# Patient Record
Sex: Female | Born: 1950 | Race: Black or African American | Hispanic: No | Marital: Single | Smoking: Current every day smoker
Health system: Southern US, Community
[De-identification: ages and names within clinical notes are randomized; demographics above are authoritative.]

## PROBLEM LIST (undated history)

## (undated) DIAGNOSIS — K219 Gastro-esophageal reflux disease without esophagitis: Secondary | ICD-10-CM

## (undated) DIAGNOSIS — M1990 Unspecified osteoarthritis, unspecified site: Secondary | ICD-10-CM

## (undated) DIAGNOSIS — I10 Essential (primary) hypertension: Secondary | ICD-10-CM

## (undated) DIAGNOSIS — Z8709 Personal history of other diseases of the respiratory system: Secondary | ICD-10-CM

## (undated) HISTORY — PX: OTHER SURGICAL HISTORY: SHX169

## (undated) HISTORY — PX: KNEE SURGERY: SHX244

## (undated) HISTORY — DX: Gastro-esophageal reflux disease without esophagitis: K21.9

---

## 1999-11-19 ENCOUNTER — Other Ambulatory Visit: Admission: RE | Admit: 1999-11-19 | Discharge: 1999-11-19 | Payer: Self-pay | Admitting: Internal Medicine

## 2000-09-14 ENCOUNTER — Encounter: Payer: Self-pay | Admitting: Emergency Medicine

## 2000-09-14 ENCOUNTER — Emergency Department (HOSPITAL_COMMUNITY): Admission: EM | Admit: 2000-09-14 | Discharge: 2000-09-14 | Payer: Self-pay | Admitting: Emergency Medicine

## 2001-05-11 ENCOUNTER — Other Ambulatory Visit: Admission: RE | Admit: 2001-05-11 | Discharge: 2001-05-11 | Payer: Self-pay | Admitting: Internal Medicine

## 2001-05-21 ENCOUNTER — Encounter: Admission: RE | Admit: 2001-05-21 | Discharge: 2001-05-21 | Payer: Self-pay | Admitting: Internal Medicine

## 2001-05-21 ENCOUNTER — Encounter: Payer: Self-pay | Admitting: Internal Medicine

## 2001-07-14 ENCOUNTER — Encounter: Admission: RE | Admit: 2001-07-14 | Discharge: 2001-07-14 | Payer: Self-pay | Admitting: Internal Medicine

## 2001-07-14 ENCOUNTER — Encounter: Payer: Self-pay | Admitting: Internal Medicine

## 2001-12-31 ENCOUNTER — Emergency Department (HOSPITAL_COMMUNITY): Admission: EM | Admit: 2001-12-31 | Discharge: 2001-12-31 | Payer: Self-pay | Admitting: Emergency Medicine

## 2001-12-31 ENCOUNTER — Encounter: Payer: Self-pay | Admitting: Emergency Medicine

## 2002-09-13 ENCOUNTER — Emergency Department (HOSPITAL_COMMUNITY): Admission: EM | Admit: 2002-09-13 | Discharge: 2002-09-13 | Payer: Self-pay | Admitting: Emergency Medicine

## 2004-11-06 ENCOUNTER — Emergency Department (HOSPITAL_COMMUNITY): Admission: EM | Admit: 2004-11-06 | Discharge: 2004-11-06 | Payer: Self-pay | Admitting: Emergency Medicine

## 2004-11-26 ENCOUNTER — Emergency Department (HOSPITAL_COMMUNITY): Admission: EM | Admit: 2004-11-26 | Discharge: 2004-11-26 | Payer: Self-pay | Admitting: Emergency Medicine

## 2005-04-13 ENCOUNTER — Emergency Department (HOSPITAL_COMMUNITY): Admission: EM | Admit: 2005-04-13 | Discharge: 2005-04-13 | Payer: Self-pay | Admitting: Emergency Medicine

## 2005-08-08 ENCOUNTER — Emergency Department (HOSPITAL_COMMUNITY): Admission: EM | Admit: 2005-08-08 | Discharge: 2005-08-08 | Payer: Self-pay | Admitting: Emergency Medicine

## 2005-08-12 ENCOUNTER — Ambulatory Visit (HOSPITAL_COMMUNITY): Admission: RE | Admit: 2005-08-12 | Discharge: 2005-08-12 | Payer: Self-pay | Admitting: Orthopedic Surgery

## 2005-10-29 ENCOUNTER — Emergency Department (HOSPITAL_COMMUNITY): Admission: EM | Admit: 2005-10-29 | Discharge: 2005-10-29 | Payer: Self-pay | Admitting: Emergency Medicine

## 2006-09-05 ENCOUNTER — Emergency Department (HOSPITAL_COMMUNITY): Admission: EM | Admit: 2006-09-05 | Discharge: 2006-09-06 | Payer: Self-pay | Admitting: Emergency Medicine

## 2007-12-14 ENCOUNTER — Emergency Department (HOSPITAL_COMMUNITY): Admission: EM | Admit: 2007-12-14 | Discharge: 2007-12-14 | Payer: Self-pay | Admitting: Emergency Medicine

## 2008-03-09 ENCOUNTER — Emergency Department (HOSPITAL_COMMUNITY): Admission: EM | Admit: 2008-03-09 | Discharge: 2008-03-09 | Payer: Self-pay | Admitting: Emergency Medicine

## 2008-04-18 ENCOUNTER — Emergency Department (HOSPITAL_COMMUNITY): Admission: EM | Admit: 2008-04-18 | Discharge: 2008-04-18 | Payer: Self-pay | Admitting: Emergency Medicine

## 2008-10-30 ENCOUNTER — Emergency Department (HOSPITAL_COMMUNITY): Admission: EM | Admit: 2008-10-30 | Discharge: 2008-10-30 | Payer: Self-pay | Admitting: Emergency Medicine

## 2009-02-26 ENCOUNTER — Emergency Department (HOSPITAL_COMMUNITY): Admission: EM | Admit: 2009-02-26 | Discharge: 2009-02-27 | Payer: Self-pay | Admitting: Emergency Medicine

## 2010-04-10 ENCOUNTER — Emergency Department (HOSPITAL_COMMUNITY): Admission: EM | Admit: 2010-04-10 | Discharge: 2010-04-10 | Payer: Self-pay | Admitting: Emergency Medicine

## 2010-04-22 ENCOUNTER — Emergency Department (HOSPITAL_COMMUNITY): Admission: EM | Admit: 2010-04-22 | Discharge: 2010-04-23 | Payer: Self-pay | Admitting: Emergency Medicine

## 2010-10-16 ENCOUNTER — Emergency Department (HOSPITAL_COMMUNITY): Payer: Self-pay

## 2010-10-16 ENCOUNTER — Emergency Department (HOSPITAL_COMMUNITY)
Admission: EM | Admit: 2010-10-16 | Discharge: 2010-10-16 | Disposition: A | Discharge status: Home / Self Care | Payer: Self-pay | Attending: Emergency Medicine | Admitting: Emergency Medicine

## 2010-10-16 DIAGNOSIS — I10 Essential (primary) hypertension: Secondary | ICD-10-CM | POA: Insufficient documentation

## 2010-10-16 DIAGNOSIS — M79609 Pain in unspecified limb: Secondary | ICD-10-CM | POA: Insufficient documentation

## 2010-10-16 LAB — POCT CARDIAC MARKERS
CKMB, poc: <1 ng/mL — ABNORMAL LOW (ref 1.0–8.0)
Troponin i, poc: <0.05 ng/mL (ref 0.00–0.09)

## 2010-10-16 LAB — POCT I-STAT, CHEM 8
Chloride: 108 mEq/L (ref 96–112)
Creatinine, Ser: 0.9 mg/dL (ref 0.4–1.2)
Glucose, Bld: 105 mg/dL — ABNORMAL HIGH (ref 70–99)
HCT: 38 % (ref 36.0–46.0)
Sodium: 142 mEq/L (ref 135–145)
TCO2: 24 mmol/L (ref 0–100)

## 2010-11-08 LAB — DIFFERENTIAL
Basophils Relative: 0 % (ref 0–1)
Eosinophils Absolute: 0.1 10*3/uL (ref 0.0–0.7)
Eosinophils Relative: 2 % (ref 0–5)
Lymphocytes Relative: 15 % (ref 12–46)
Monocytes Relative: 4 % (ref 3–12)
Neutro Abs: 5.7 10*3/uL (ref 1.7–7.7)
Neutrophils Relative %: 80 % — ABNORMAL HIGH (ref 43–77)

## 2010-11-08 LAB — POCT I-STAT, CHEM 8
Calcium, Ion: 1.04 mmol/L — ABNORMAL LOW (ref 1.12–1.32)
Chloride: 107 mEq/L (ref 96–112)
Creatinine, Ser: 1.1 mg/dL (ref 0.4–1.2)
Glucose, Bld: 117 mg/dL — ABNORMAL HIGH (ref 70–99)
Potassium: 4 mEq/L (ref 3.5–5.1)
Sodium: 140 mEq/L (ref 135–145)
TCO2: 25 mmol/L (ref 0–100)

## 2010-11-08 LAB — CBC
Hemoglobin: 12.2 g/dL (ref 12.0–15.0)
MCH: 27 pg (ref 26.0–34.0)
MCV: 81.4 fL (ref 78.0–100.0)
RDW: 15.4 % (ref 11.5–15.5)

## 2010-11-08 LAB — POCT CARDIAC MARKERS: Myoglobin, poc: 70.3 ng/mL (ref 12–200)

## 2011-01-14 ENCOUNTER — Emergency Department (HOSPITAL_COMMUNITY)
Admission: EM | Admit: 2011-01-14 | Discharge: 2011-01-14 | Disposition: A | Discharge status: Home / Self Care | Payer: Self-pay | Attending: Emergency Medicine | Admitting: Emergency Medicine

## 2011-01-14 ENCOUNTER — Emergency Department (HOSPITAL_COMMUNITY): Payer: Self-pay

## 2011-01-14 DIAGNOSIS — I10 Essential (primary) hypertension: Secondary | ICD-10-CM | POA: Insufficient documentation

## 2011-01-14 DIAGNOSIS — M79609 Pain in unspecified limb: Secondary | ICD-10-CM | POA: Insufficient documentation

## 2011-01-14 DIAGNOSIS — M109 Gout, unspecified: Secondary | ICD-10-CM | POA: Insufficient documentation

## 2011-01-14 LAB — COMPREHENSIVE METABOLIC PANEL
Albumin: 3.5 g/dL (ref 3.5–5.2)
Alkaline Phosphatase: 129 U/L — ABNORMAL HIGH (ref 39–117)
CO2: 26 mEq/L (ref 19–32)
Creatinine, Ser: 0.75 mg/dL (ref 0.4–1.2)
GFR calc Af Amer: >60 mL/min (ref >60)
Glucose, Bld: 126 mg/dL — ABNORMAL HIGH (ref 70–99)
Total Protein: 7.6 g/dL (ref 6.0–8.3)

## 2011-05-20 LAB — DIFFERENTIAL
Basophils Relative: 0
Eosinophils Absolute: 0.1
Lymphs Abs: 1.4
Monocytes Relative: 6
Neutro Abs: 4.6

## 2011-05-20 LAB — CBC
HCT: 39.1
Hemoglobin: 12.7
MCHC: 32.6
MCV: 83.4
Platelets: 208
RBC: 4.68
RDW: 14.7
WBC: 6.5

## 2011-05-20 LAB — BASIC METABOLIC PANEL
BUN: 11
Calcium: 9.1
Creatinine, Ser: 0.85
GFR calc non Af Amer: >60
Glucose, Bld: 92
Sodium: 140

## 2011-05-20 LAB — POCT CARDIAC MARKERS
Operator id: 1415
Troponin i, poc: <0.05

## 2011-05-20 LAB — B-NATRIURETIC PEPTIDE (CONVERTED LAB): Pro B Natriuretic peptide (BNP): <30

## 2011-05-23 LAB — DIFFERENTIAL
Basophils Absolute: 0
Basophils Relative: 0
Eosinophils Absolute: 0.1
Eosinophils Relative: 1
Lymphocytes Relative: 10 — ABNORMAL LOW
Lymphs Abs: 1.1
Neutro Abs: 9 — ABNORMAL HIGH
Neutrophils Relative %: 87 — ABNORMAL HIGH

## 2011-05-23 LAB — POCT I-STAT, CHEM 8
BUN: 14
Creatinine, Ser: 1
Glucose, Bld: 112 — ABNORMAL HIGH
Hemoglobin: 12.6
TCO2: 27

## 2011-05-23 LAB — POCT CARDIAC MARKERS: Troponin i, poc: <0.05

## 2011-05-23 LAB — CBC
HCT: 35 — ABNORMAL LOW
Hemoglobin: 11.5 — ABNORMAL LOW
MCHC: 32.8
Platelets: 243
RBC: 4.22
RDW: 14.1

## 2011-12-10 ENCOUNTER — Encounter (HOSPITAL_COMMUNITY): Payer: Self-pay | Admitting: *Deleted

## 2011-12-10 ENCOUNTER — Emergency Department (HOSPITAL_COMMUNITY)
Admission: EM | Admit: 2011-12-10 | Discharge: 2011-12-10 | Disposition: A | Discharge status: Home / Self Care | Payer: Self-pay | Attending: Emergency Medicine | Admitting: Emergency Medicine

## 2011-12-10 DIAGNOSIS — Z79899 Other long term (current) drug therapy: Secondary | ICD-10-CM | POA: Insufficient documentation

## 2011-12-10 DIAGNOSIS — M19019 Primary osteoarthritis, unspecified shoulder: Secondary | ICD-10-CM | POA: Insufficient documentation

## 2011-12-10 DIAGNOSIS — M1710 Unilateral primary osteoarthritis, unspecified knee: Secondary | ICD-10-CM | POA: Insufficient documentation

## 2011-12-10 DIAGNOSIS — I10 Essential (primary) hypertension: Secondary | ICD-10-CM | POA: Insufficient documentation

## 2011-12-10 DIAGNOSIS — M25561 Pain in right knee: Secondary | ICD-10-CM

## 2011-12-10 DIAGNOSIS — M25519 Pain in unspecified shoulder: Secondary | ICD-10-CM

## 2011-12-10 DIAGNOSIS — F17200 Nicotine dependence, unspecified, uncomplicated: Secondary | ICD-10-CM | POA: Insufficient documentation

## 2011-12-10 HISTORY — DX: Unspecified osteoarthritis, unspecified site: M19.90

## 2011-12-10 HISTORY — DX: Essential (primary) hypertension: I10

## 2011-12-10 MED ORDER — KETOROLAC TROMETHAMINE 60 MG/2ML IM SOLN
60.0000 mg | Freq: Once | INTRAMUSCULAR | Status: AC
  Administered 2011-12-10: 60 mg via INTRAMUSCULAR
  Filled 2011-12-10: qty 2

## 2011-12-10 MED ORDER — BACLOFEN 10 MG PO TABS: 10.0000 mg | ORAL_TABLET | Freq: Three times a day (TID) | ORAL | Status: AC

## 2011-12-10 MED ORDER — OXYCODONE-ACETAMINOPHEN 5-325 MG PO TABS
1.0000 | ORAL_TABLET | Freq: Once | ORAL | Status: DC
  Filled 2011-12-10: qty 1

## 2011-12-10 NOTE — Discharge Instructions (Signed)
Acromioclavicular Injuries  The AC (acromioclavicular) joint is the joint in the shoulder where the collarbone (clavicle) meets the shoulder blade (scapula). The part of the shoulder blade connected to the collarbone is called the acromion. Common problems with and treatments for the AC joint are detailed below.  ARTHRITIS  Arthritis occurs when the joint has been injured and the smooth padding between the joints (cartilage) is lost. This is the wear and tear seen in most joints of the body if they have been overused. This causes the joint to produce pain and swelling which is worse with activity.   AC JOINT SEPARATION  AC joint separation means that the ligaments connecting the acromion of the shoulder blade and collarbone have been damaged, and the two bones no longer line up. AC separations can be anywhere from mild to severe, and are "graded" depending upon which ligaments are torn and how badly they are torn.   Grade I Injury: the least damage is done, and the AC joint still lines up.   Grade II Injury: damage to the ligaments which reinforce the AC joint. In a Grade II injury, these ligaments are stretched but not entirely torn. When stressed, the AC joint becomes painful and unstable.   Grade III Injury: AC and secondary ligaments are completely torn, and the collarbone is no longer attached to the shoulder blade. This results in deformity; a prominence of the end of the clavicle.  AC JOINT FRACTURE  AC joint fracture means that there has been a break in the bones of the AC joint, usually the end of the clavicle.  TREATMENT  TREATMENT OF AC ARTHRITIS   There is currently no way to replace the cartilage damaged by arthritis. The best way to improve the condition is to decrease the activities which aggravate the problem. Application of ice to the joint helps decrease pain and soreness (inflammation). The use of non-steroidal anti-inflammatory medication is helpful.   If less conservative measures do not  work, then cortisone shots (injections) may be used. These are anti-inflammatories; they decrease the soreness in the joint and swelling.   If non-surgical measures fail, surgery may be recommended. The procedure is generally removal of a portion of the end of the clavicle. This is the part of the collarbone closest to your acromion which is stabilized with ligaments to the acromion of the shoulder blade. This surgery may be performed using a tube-like instrument with a light (arthroscope) for looking into a joint. It may also be performed as an open surgery through a small incision by the surgeon. Most patients will have good range of motion within 6 weeks and may return to all activity including sports by 8-12 weeks, barring complications.  TREATMENT OF AN AC SEPARATION   The initial treatment is to decrease pain. This is best accomplished by immobilizing the arm in a sling and placing an ice pack to the shoulder for 20 to 30 minutes every 2 hours as needed. As the pain starts to subside, it is important to begin moving the fingers, wrist, elbow and eventually the shoulder in order to prevent a stiff or "frozen" shoulder. Instruction on when and how much to move the shoulder will be provided by your caregiver. The length of time needed to regain full motion and function depends on the amount or grade of the injury. Recovery from a Grade I AC separation usually takes 10 to 14 days, whereas a Grade III may take 6 to 8 weeks.   Grade   III injuries usually allow return to full activity with few restrictions. Treatment is also based on the activity demands of the injured shoulder. For example, a high level quarterback with an injured throwing arm will receive more aggressive treatment than someone with a desk job who rarely uses his/her arm for strenuous activities. In some cases, a painful lump may persist which could require a later surgery.  Surgery can be very successful, but the benefits must be weighed against the potential risks.  TREATMENT OF AN AC JOINT FRACTURE Fracture treatment depends on the type of fracture. Sometimes a splint or sling may be all that is required. Other times surgery may be required for repair. This is more frequently the case when the ligaments supporting the clavicle are completely torn. Your caregiver will help you with these decisions and together you can decide what will be the best treatment. HOME CARE INSTRUCTIONS   Apply ice to the injury for 15 to 20 minutes each hour while awake for 2 days. Put the ice in a plastic bag and place a towel between the bag of ice and skin.   If a sling has been applied, wear it constantly for as long as directed by your caregiver, even at night. The sling or splint can be removed for bathing or showering or as directed. Be sure to keep the shoulder in the same place as when the sling is on. Do not lift the arm.   If a figure-of-eight splint has been applied it should be tightened gently by another person every day. Tighten it enough to keep the shoulders held back. Allow enough room to place the index finger between the body and strap. Loosen the splint immediately if there is numbness or tingling in the hands.   Take over-the-counter or prescription medicines for pain, discomfort or fever as directed by your caregiver.   If you or your child has received a follow up appointment, it is very important to keep that appointment in order to avoid long term complications, chronic pain or disability.  SEEK MEDICAL CARE IF:   The pain is not relieved with medications.   There is increased swelling or discoloration that continues to get worse rather than better.   You or your child has been unable to follow up as instructed.   There is progressive numbness and tingling in the arm, forearm or hand.  SEEK IMMEDIATE MEDICAL CARE IF:   The arm is numb, cold or pale.    There is increasing pain in the hand, forearm or fingers.  MAKE SURE YOU:   Understand these instructions.   Will watch your condition.   Will get help right away if you are not doing well or get worse.  Document Released: 05/21/2005 Document Revised: 07/31/2011 Document Reviewed: 11/13/2008 [facility] Patient Information 2012 [place], [place].Arthralgia Your caregiver has diagnosed you as suffering from an arthralgia. Arthralgia means there is pain in a joint. This can come from many reasons including:  Bruising the joint which causes soreness (inflammation) in the joint.   Wear and tear on the joints which occur as we grow older (osteoarthritis).   Overusing the joint.   Various forms of arthritis.   Infections of the joint.  Regardless of the cause of pain in your joint, most of these different pains respond to anti-inflammatory drugs and rest. The exception to this is when a joint is infected, and these cases are treated with antibiotics, if it is a bacterial infection. HOME CARE INSTRUCTIONS  Rest the injured area for as long as directed by your caregiver. Then slowly start using the joint as directed by your caregiver and as the pain allows. Crutches as directed may be useful if the ankles, knees or hips are involved. If the knee was splinted or casted, continue use and care as directed. If an stretchy or elastic wrapping bandage has been applied today, it should be removed and re-applied every 3 to 4 hours. It should not be applied tightly, but firmly enough to keep swelling down. Watch toes and feet for swelling, bluish discoloration, coldness, numbness or excessive pain. If any of these problems (symptoms) occur, remove the ace bandage and re-apply more loosely. If these symptoms persist, contact your caregiver or return to this location.   For the first 24 hours, keep the injured extremity elevated on pillows while lying down.   Apply ice for 15 to 20 minutes to the sore joint  every couple hours while awake for the first half day. Then 3 to 4 times per day for the first 48 hours. Put the ice in a plastic bag and place a towel between the bag of ice and your skin.   Wear any splinting, casting, elastic bandage applications, or slings as instructed.   Only take over-the-counter or prescription medicines for pain, discomfort, or fever as directed by your caregiver. Do not use aspirin immediately after the injury unless instructed by your physician. Aspirin can cause increased bleeding and bruising of the tissues.   If you were given crutches, continue to use them as instructed and do not resume weight bearing on the sore joint until instructed.  Persistent pain and inability to use the sore joint as directed for more than 2 to 3 days are warning signs indicating that you should see a caregiver for a follow-up visit as soon as possible. Initially, a hairline fracture (break in bone) may not be evident on X-rays. Persistent pain and swelling indicate that further evaluation, non-weight bearing or use of the joint (use of crutches or slings as instructed), or further X-rays are indicated. X-rays may sometimes not show a small fracture until a week or 10 days later. Make a follow-up appointment with your own caregiver or one to whom we have referred you. A radiologist (specialist in reading X-rays) may read your X-rays. Make sure you know how you are to obtain your X-ray results. Do not assume everything is normal if you do not hear from [place]. SEEK MEDICAL CARE IF: Bruising, swelling, or pain increases. SEEK IMMEDIATE MEDICAL CARE IF:   Your fingers or toes are numb or blue.   The pain is not responding to medications and continues to stay the same or get worse.   The pain in your joint becomes severe.   You develop a fever over 102 F (38.9 C).   It becomes impossible to move or use the joint.  MAKE SURE YOU:   Understand these instructions.   Will watch your condition.     Will get help right away if you are not doing well or get worse.  Document Released: 08/11/2005 Document Revised: 07/31/2011 Document Reviewed: 03/29/2008 [facility] Patient Information 2012 [place], [place].Knee Pain The knee is the complex joint between your thigh and your lower leg. It is made up of bones, tendons, ligaments, and cartilage. The bones that make up the knee are:  The femur in the thigh.   The tibia and fibula in the lower leg.   The patella or kneecap  riding in the groove on the lower femur.  CAUSES  Knee pain is a common complaint with many causes. A few of these causes are:  Injury, such as:   A ruptured ligament or tendon injury.   Torn cartilage.   Medical conditions, such as:   Gout   Arthritis   Infections   Overuse, over training or overdoing a physical activity.  Knee pain can be minor or severe. Knee pain can accompany debilitating injury. Minor knee problems often respond well to self-care measures or get well on their own. More serious injuries may need medical intervention or even surgery. SYMPTOMS The knee is complex. Symptoms of knee problems can vary widely. Some of the problems are:  Pain with movement and weight bearing.   Swelling and tenderness.   Buckling of the knee.   Inability to straighten or extend your knee.   Your knee locks and you cannot straighten it.   Warmth and redness with pain and fever.   Deformity or dislocation of the kneecap.  DIAGNOSIS  Determining what is wrong may be very straight forward such as when there is an injury. It can also be challenging because of the complexity of the knee. Tests to make a diagnosis may include:  Your caregiver taking a history and doing a physical exam.   Routine X-rays can be used to rule out other problems. X-rays will not reveal a cartilage tear. Some injuries of the knee can be diagnosed by:   Arthroscopy a surgical technique by which a small video camera is inserted  through tiny incisions on the sides of the knee. This procedure is used to examine and repair internal knee joint problems. Tiny instruments can be used during arthroscopy to repair the torn knee cartilage (meniscus).   Arthrography is a radiology technique. A contrast liquid is directly injected into the knee joint. Internal structures of the knee joint then become visible on X-ray film.   An MRI scan is a non x-ray radiology procedure in which magnetic fields and a computer produce two- or three-dimensional images of the inside of the knee. Cartilage tears are often visible using an MRI scanner. MRI scans have largely replaced arthrography in diagnosing cartilage tears of the knee.   Blood work.   Examination of the fluid that helps to lubricate the knee joint (synovial fluid). This is done by taking a sample out using a needle and a syringe.  TREATMENT The treatment of knee problems depends on the cause. Some of these treatments are:  Depending on the injury, proper casting, splinting, surgery or physical therapy care will be needed.   Give yourself adequate recovery time. Do not overuse your joints. If you begin to get sore during workout routines, back off. Slow down or do fewer repetitions.   For repetitive activities such as cycling or running, maintain your strength and nutrition.   Alternate muscle groups. For example if you are a weight lifter, work the upper body on one day and the lower body the next.   Either tight or weak muscles do not give the proper support for your knee. Tight or weak muscles do not absorb the stress placed on the knee joint. Keep the muscles surrounding the knee strong.   Take care of mechanical problems.   If you have flat feet, orthotics or special shoes may help. See your caregiver if you need help.   Arch supports, sometimes with wedges on the inner or outer aspect of the heel, can  help. These can shift pressure away from the side of the knee most  bothered by osteoarthritis.   A brace called an "unloader" brace also may be used to help ease the pressure on the most arthritic side of the knee.   If your caregiver has prescribed crutches, braces, wraps or ice, use as directed. The acronym for this is PRICE. This means protection, rest, ice, compression and elevation.   Nonsteroidal anti-inflammatory drugs (NSAID's), can help relieve pain. But if taken immediately after an injury, they may actually increase swelling. Take NSAID's with food in your stomach. Stop them if you develop stomach problems. Do not take these if you have a history of ulcers, stomach pain or bleeding from the bowel. Do not take without your caregiver's approval if you have problems with fluid retention, heart failure, or kidney problems.   For ongoing knee problems, physical therapy may be helpful.   Glucosamine and chondroitin are over-the-counter dietary supplements. Both may help relieve the pain of osteoarthritis in the knee. These medicines are different from the usual anti-inflammatory drugs. Glucosamine may decrease the rate of cartilage destruction.   Injections of a corticosteroid drug into your knee joint may help reduce the symptoms of an arthritis flare-up. They may provide pain relief that lasts a few months. You may have to wait a few months between injections. The injections do have a small increased risk of infection, water retention and elevated blood sugar levels.   Hyaluronic acid injected into damaged joints may ease pain and provide lubrication. These injections may work by reducing inflammation. A series of shots may give relief for as long as 6 months.   Topical painkillers. Applying certain ointments to your skin may help relieve the pain and stiffness of osteoarthritis. Ask your pharmacist for suggestions. Many over the-counter products are approved for temporary relief of arthritis pain.   In some countries, doctors often prescribe topical  NSAID's for relief of chronic conditions such as arthritis and tendinitis. A review of treatment with NSAID creams found that they worked as well as oral medications but without the serious side effects.  PREVENTION  Maintain a healthy weight. Extra pounds put more strain on your joints.   Get strong, stay limber. Weak muscles are a common cause of knee injuries. Stretching is important. Include flexibility exercises in your workouts.   Be smart about exercise. If you have osteoarthritis, chronic knee pain or recurring injuries, you may need to change the way you exercise. This does not mean you have to stop being active. If your knees ache after jogging or playing basketball, consider switching to swimming, water aerobics or other low-impact activities, at least for a few days a week. Sometimes limiting high-impact activities will provide relief.   Make sure your shoes fit well. Choose footwear that is right for your sport.   Protect your knees. Use the proper gear for knee-sensitive activities. Use kneepads when playing volleyball or laying carpet. Buckle your seat belt every time you drive. Most shattered kneecaps occur in car accidents.   Rest when you are tired.  SEEK MEDICAL CARE IF:  You have knee pain that is continual and does not seem to be getting better.  SEEK IMMEDIATE MEDICAL CARE IF:  Your knee joint feels hot to the touch and you have a high fever. MAKE SURE YOU:   Understand these instructions.   Will watch your condition.   Will get help right away if you are not doing well or get worse.  Document Released: 06/08/2007 Document Revised: 07/31/2011 Document Reviewed: 06/08/2007 [facility] Patient Information 2012 [place], [place].

## 2011-12-10 NOTE — ED Provider Notes (Signed)
Medical screening examination/treatment/procedure(s) were performed by non-physician practitioner and as supervising physician I was immediately available for consultation/collaboration.   Laray Anger, DO 12/10/11 2000

## 2011-12-10 NOTE — ED Provider Notes (Signed)
History     CSN: [id number]  Arrival date & time 12/10/11  4098   First MD Initiated Contact with Patient 12/10/11 1008      Chief Complaint  Patient presents with  . Arm Pain  . Knee Pain    (Consider location/radiation/quality/duration/timing/severity/associated sxs/prior treatment) HPI  Pt presents to the ED with complaints of right shoulder pain and bilateral knee pain. She is tearful and informs me that they have all three been hurting for 3 days. She admits to having a history of osteoarthritis and that her joints have hurt like this before. She advises me that she is unable to see a primary care doctor or orthopedist. She also tells me that she can't afford to get any medicine but that she needs help. She denies recent injury, fall, or remembering a specific moment when it started to hurt. She is in no acute distress but uncomfortable.  Past Medical History  Diagnosis Date  . Hypertension   . Arthritis     osteoarthritis    History reviewed. No pertinent past surgical history.  No family history on file.  History  Substance Use Topics  . Smoking status: Current Everyday Smoker    Types: Cigarettes  . Smokeless tobacco: Not on file  . Alcohol Use: No    OB History    Grav Para Term Preterm Abortions TAB SAB Ect Mult Living                  Review of Systems  All other systems reviewed and are negative.    Allergies  Review of patient's allergies indicates no known allergies.  Home Medications   Current Outpatient Rx  Name Route Sig Dispense Refill  . ASPIRIN-ACETAMINOPHEN-CAFFEINE 250-250-65 MG PO TABS Oral Take 1 tablet by mouth every 6 (six) hours as needed. pain    . IBUPROFEN 200 MG PO TABS Oral Take 400 mg by mouth every 6 (six) hours as needed. pain    . BACLOFEN 10 MG PO TABS Oral Take 1 tablet (10 mg total) by mouth 3 (three) times daily. 30 each 0    BP 184/110  Pulse 81  Temp(Src) 99 F (37.2 C) (Oral)  Resp 18  SpO2 100%  Physical  Exam  Nursing note and vitals reviewed. Constitutional: She appears well-developed and well-nourished. No distress.  HENT:  Head: Normocephalic and atraumatic.  Eyes: Pupils are equal, round, and reactive to light.  Neck: Normal range of motion. Neck supple.  Cardiovascular: Normal rate and regular rhythm.   Pulmonary/Chest: Effort normal.  Abdominal: Soft.  Musculoskeletal:       Right shoulder: She exhibits decreased range of motion (due to pain), tenderness (AC joint) and spasm. She exhibits no bony tenderness, no swelling, no effusion, no crepitus, no deformity, no laceration, no pain, normal pulse and normal strength.       Right knee: Normal.       Left knee: Normal.  Neurological: She is alert.  Skin: Skin is warm and dry.    ED Course  Procedures (including critical care time)  Labs Reviewed - No data to display No results found.   1. Shoulder pain   2. Knee pain, bilateral       MDM   The patient does not have midline tenderness or generalized or focal weakness which would be concerning for an acute process.Pt has had these symptoms before and they appear to be consistent with arthritis. Pt given 1 percocet and a shot of IM  Toradol in the ED. Pt given Rx for Baclofen 10 mg tab  Dorthula Matas, PA 12/10/11 1032

## 2011-12-10 NOTE — ED Notes (Signed)
Pt reports R shoulder pain that radiates down to her R arm.  Pt also reports bila knee pain x 3 days.  Pt has hx of osteoarthritis

## 2012-11-23 ENCOUNTER — Encounter (HOSPITAL_COMMUNITY): Payer: Self-pay | Admitting: Family Medicine

## 2012-11-23 ENCOUNTER — Emergency Department (HOSPITAL_COMMUNITY): Payer: Medicare Other

## 2012-11-23 ENCOUNTER — Emergency Department (HOSPITAL_COMMUNITY)
Admission: EM | Admit: 2012-11-23 | Discharge: 2012-11-23 | Disposition: A | Discharge status: Home / Self Care | Payer: Medicare Other | Attending: Emergency Medicine | Admitting: Emergency Medicine

## 2012-11-23 DIAGNOSIS — M25476 Effusion, unspecified foot: Secondary | ICD-10-CM | POA: Insufficient documentation

## 2012-11-23 DIAGNOSIS — R350 Frequency of micturition: Secondary | ICD-10-CM | POA: Insufficient documentation

## 2012-11-23 DIAGNOSIS — F17200 Nicotine dependence, unspecified, uncomplicated: Secondary | ICD-10-CM | POA: Diagnosis not present

## 2012-11-23 DIAGNOSIS — R609 Edema, unspecified: Secondary | ICD-10-CM

## 2012-11-23 DIAGNOSIS — M25473 Effusion, unspecified ankle: Secondary | ICD-10-CM | POA: Insufficient documentation

## 2012-11-23 DIAGNOSIS — Z8739 Personal history of other diseases of the musculoskeletal system and connective tissue: Secondary | ICD-10-CM | POA: Insufficient documentation

## 2012-11-23 DIAGNOSIS — I10 Essential (primary) hypertension: Secondary | ICD-10-CM | POA: Insufficient documentation

## 2012-11-23 DIAGNOSIS — Z79899 Other long term (current) drug therapy: Secondary | ICD-10-CM | POA: Diagnosis not present

## 2012-11-23 DIAGNOSIS — H539 Unspecified visual disturbance: Secondary | ICD-10-CM | POA: Diagnosis not present

## 2012-11-23 DIAGNOSIS — R0602 Shortness of breath: Secondary | ICD-10-CM | POA: Diagnosis not present

## 2012-11-23 DIAGNOSIS — R51 Headache: Secondary | ICD-10-CM | POA: Diagnosis not present

## 2012-11-23 LAB — POCT I-STAT, CHEM 8
Creatinine, Ser: 0.9 mg/dL (ref 0.50–1.10)
Glucose, Bld: 113 mg/dL — ABNORMAL HIGH (ref 70–99)
HCT: 37 % (ref 36.0–46.0)
Hemoglobin: 12.6 g/dL (ref 12.0–15.0)
Potassium: 3.6 mEq/L (ref 3.5–5.1)
TCO2: 25 mmol/L (ref 0–100)

## 2012-11-23 LAB — CBC WITH DIFFERENTIAL/PLATELET
Basophils Relative: 0 % (ref 0–1)
Eosinophils Absolute: 0.1 10*3/uL (ref 0.0–0.7)
Eosinophils Relative: 1 % (ref 0–5)
Hemoglobin: 11 g/dL — ABNORMAL LOW (ref 12.0–15.0)
Lymphs Abs: 1.5 10*3/uL (ref 0.7–4.0)
MCH: 26.4 pg (ref 26.0–34.0)
MCHC: 31.3 g/dL (ref 30.0–36.0)
MCV: 84.2 fL (ref 78.0–100.0)
Monocytes Relative: 7 % (ref 3–12)
Neutrophils Relative %: 71 % (ref 43–77)
Platelets: 248 10*3/uL (ref 150–400)

## 2012-11-23 MED ORDER — AMLODIPINE BESYLATE 5 MG PO TABS: 5.0000 mg | ORAL_TABLET | Freq: Once | ORAL | Status: DC

## 2012-11-23 MED ORDER — AMLODIPINE BESYLATE 5 MG PO TABS
5.0000 mg | ORAL_TABLET | Freq: Once | ORAL | Status: AC
  Administered 2012-11-23: 5 mg via ORAL
  Filled 2012-11-23: qty 1

## 2012-11-23 MED ORDER — HYDROCHLOROTHIAZIDE 25 MG PO TABS: 25.0000 mg | ORAL_TABLET | Freq: Every day | ORAL | Status: DC

## 2012-11-23 MED ORDER — HYDROCHLOROTHIAZIDE 25 MG PO TABS
25.0000 mg | ORAL_TABLET | Freq: Every day | ORAL | Status: DC
  Administered 2012-11-23: 25 mg via ORAL
  Filled 2012-11-23: qty 1

## 2012-11-23 MED ORDER — SODIUM CHLORIDE 0.9 % IV SOLN
Freq: Once | INTRAVENOUS | Status: AC
  Administered 2012-11-23: 05:00:00 via INTRAVENOUS

## 2012-11-23 NOTE — ED Provider Notes (Signed)
Medical screening examination/treatment/procedure(s) were performed by non-physician practitioner and as supervising physician I was immediately available for consultation/collaboration.   Caris Cerveny M Delmore Sear, MD 11/23/12 0814 

## 2012-11-23 NOTE — ED Notes (Signed)
Patient states that her left arm has been hurting since Sunday. Describes pain as being near rotator cuff; pain worse with movement. Has been taking Ibuprofen without relief. States she has the same type pain in her right arm that started yesterday. Also has bilateral swelling to ankles that started 2 days ago. States she knows she has high blood pressure and has not been on BP meds "in a good while." Denies chest pain and shortness of breath.

## 2012-11-23 NOTE — ED Provider Notes (Signed)
History     CSN: [id number]  Arrival date & time 11/23/12  4098   First MD Initiated Contact with Patient 11/23/12 0423      Chief Complaint  Patient presents with  . Arm Pain  . Joint Swelling    (Consider location/radiation/quality/duration/timing/severity/associated sxs/prior treatment) HPI Comments: Patient with a history of hypertension, osteoarthritis.  Has not taken any antihypertensives in over a year, because she lost her insurance, and her primary care provider.  She's noticed for the last several, days, that she's been more short of breath with exertion.  She is short of breath at, rest.  She's had bilateral lower extremity edema.  He had bilateral shoulder pain, radiating to arms.  She noticed about 1 year ago.  She was having decreased visual acuity in her right eye she has not seen an ophthalmologist.  She does wish "reading glasses.".  She states she does not sleep well.  She sleeps with her head elevated slightly, and sleeps for 30-40 minutes, and then is awake, for several hours.  She does not report any difficulty eating.  She is able to eat a full meal.  She sleeps in the same position as normal with her head slightly elevated.  She does report that she has frequent urination, but also states she drinks lots and lots of water.  The family history of diabetes, and that the last time.  She was at her doctor's she tested negative for diabetes  Patient is a 62 y.o. female presenting with arm pain. The history is provided by the patient.  Arm Pain This is a recurrent problem. The current episode started in the past 7 days. The problem occurs constantly. The problem has been gradually worsening. Associated symptoms include arthralgias and joint swelling. Pertinent negatives include no chest pain, chills, coughing, fatigue, headaches, nausea, numbness, vertigo, visual change, vomiting or weakness. The symptoms are aggravated by exertion. She has tried nothing for the symptoms.     Past Medical History  Diagnosis Date  . Hypertension   . Arthritis     osteoarthritis    History reviewed. No pertinent past surgical history.  No family history on file.  History  Substance Use Topics  . Smoking status: Current Every Day Smoker -- 0.50 packs/day    Types: Cigarettes  . Smokeless tobacco: Not on file  . Alcohol Use: No    OB History   Grav Para Term Preterm Abortions TAB SAB Ect Mult Living                  Review of Systems  Constitutional: Negative for chills and fatigue.  Eyes: Positive for visual disturbance. Negative for photophobia.  Respiratory: Positive for shortness of breath. Negative for cough and wheezing.   Cardiovascular: Positive for leg swelling. Negative for chest pain.  Gastrointestinal: Negative for nausea and vomiting.  Genitourinary: Positive for frequency. Negative for dysuria.  Musculoskeletal: Positive for joint swelling and arthralgias.  Neurological: Negative for dizziness, vertigo, weakness, numbness and headaches.  All other systems reviewed and are negative.    Allergies  Review of patient's allergies indicates no known allergies.  Home Medications   Current Outpatient Rx  Name  Route  Sig  Dispense  Refill  . ibuprofen (ADVIL,MOTRIN) 200 MG tablet   Oral   Take 400 mg by mouth every 6 (six) hours as needed. pain         . amLODipine (NORVASC) 5 MG tablet   Oral   Take 1 tablet (  5 mg total) by mouth once.   60 tablet   0   . aspirin-acetaminophen-caffeine (EXCEDRIN MIGRAINE) 250-250-65 MG per tablet   Oral   Take 1 tablet by mouth every 6 (six) hours as needed. pain         . hydrochlorothiazide (HYDRODIURIL) 25 MG tablet   Oral   Take 1 tablet (25 mg total) by mouth daily.   60 tablet   0     BP 166/102  Pulse 98  Temp(Src) 99.3 F (37.4 C) (Oral)  Resp 20  SpO2 100%  Physical Exam  Nursing note and vitals reviewed. Constitutional: She is oriented to person, place, and time. She  appears well-developed and well-nourished.  Morbidly obese  HENT:  Head: Normocephalic and atraumatic.  Right Ear: External ear normal.  Left Ear: External ear normal.  Eyes: EOM are normal. Pupils are equal, round, and reactive to light.  Neck: Normal range of motion.  Cardiovascular: Normal rate and regular rhythm.   No murmur heard. Pulmonary/Chest: Effort normal and breath sounds normal. No respiratory distress. She has no wheezes. She exhibits no tenderness.  Abdominal: Soft. Bowel sounds are normal. She exhibits no distension. There is no tenderness.  Due to patient's body habitus.  Difficulty to get an adequate abdominal exam  Musculoskeletal: Normal range of motion. She exhibits edema and tenderness.       Right lower leg: She exhibits edema.       Left lower leg: She exhibits edema.  Lymphadenopathy:    She has no cervical adenopathy.  Neurological: She is alert and oriented to person, place, and time.  Skin: Skin is warm and dry.    ED Course  Procedures (including critical care time)  Labs Reviewed  CBC WITH DIFFERENTIAL - Abnormal; Notable for the following:    Hemoglobin 11.0 (*)    HCT 35.1 (*)    All other components within normal limits  POCT I-STAT, CHEM 8 - Abnormal; Notable for the following:    Glucose, Bld 113 (*)    All other components within normal limits   Dg Chest 2 View  11/23/2012  *RADIOLOGY REPORT*  Clinical Data: Shortness of breath.  Bilateral shoulder and arm pain.  CHEST - 2 VIEW  Comparison: 10/16/2010  Findings: Mild cardiac enlargement is probably normal for a AP technique.  Normal pulmonary vascularity.  No focal airspace disease or consolidation in the lungs.  No blunting of costophrenic angles.  No pneumothorax.  Mediastinal contours appear intact.  No significant change since previous study.  IMPRESSION: No evidence of active pulmonary disease.   Original Report Authenticated By: Burman Nieves, M.D.    Ct Head Wo Contrast  11/23/2012   *RADIOLOGY REPORT*  Clinical Data: Elevated blood pressure.  Arm weakness and pain.  CT HEAD WITHOUT CONTRAST  Technique:  Contiguous axial images were obtained from the base of the skull through the vertex without contrast.  Comparison: CT facial bones 04/18/2008  Findings: Mild cerebral atrophy.  No significant ventricular dilatation.  Mild patchy low attenuation change in the deep white matter consistent with small vessel ischemia.  No mass effect or midline shift.  No abnormal extra-axial fluid collections.  Wallace Cullens- white matter junctions are distinct.  Basal cisterns are not effaced.  No evidence of acute intracranial hemorrhage.  No depressed skull fractures.  Vascular calcifications.  Mucosal thickening versus retention cyst in the left maxillary antrum. Paranasal sinuses and mastoid air cells are otherwise clear. Changes in the left maxillary antrum are  new since previous study.  IMPRESSION: No acute intracranial abnormalities.   Original Report Authenticated By: Burman Nieves, M.D.      1. Peripheral edema   2. Hypertension       MDM  Patient's lab, urine, x-rays, CT scan, reviewed.  I will start her on hydrochlorothiazide and Norvasc.  Refer her to her primary care physician`          Arman Filter, NP 11/23/12 4098  Arman Filter, NP 11/23/12 [phone]

## 2013-01-26 DIAGNOSIS — M25569 Pain in unspecified knee: Secondary | ICD-10-CM | POA: Diagnosis not present

## 2013-02-18 ENCOUNTER — Ambulatory Visit (INDEPENDENT_AMBULATORY_CARE_PROVIDER_SITE_OTHER): Payer: Medicare Other | Admitting: Cardiology

## 2013-02-18 ENCOUNTER — Encounter: Payer: Self-pay | Admitting: Cardiology

## 2013-02-18 VITALS — BP 158/98 | HR 82 | Ht 62.0 in | Wt 234.8 lb

## 2013-02-18 DIAGNOSIS — I10 Essential (primary) hypertension: Secondary | ICD-10-CM | POA: Diagnosis not present

## 2013-02-18 DIAGNOSIS — Z01810 Encounter for preprocedural cardiovascular examination: Secondary | ICD-10-CM

## 2013-02-18 MED ORDER — AMLODIPINE BESYLATE 5 MG PO TABS: 5.0000 mg | ORAL_TABLET | Freq: Every day | ORAL | Status: DC

## 2013-02-18 NOTE — Progress Notes (Signed)
HPI The patient presents for preop evaluation.  She has no prior cardiac history.  She is going to have left knee replacement.  She has no prior cardiac history though she has had uncontrolled hypertension. The left knee replacement. She has been limited her activities although she can slowly climb a flight of stairs (4 METS).  With this activity she does not have chest pressure, neck or arm discomfort. She does not have palpitations, presyncope or syncope. She denies any shortness of breath, PND or orthopnea. She was in the emergency room of joint pains in April. Her blood pressure was elevated at that time. I reviewed these records. She was started on Norvasc but because she didn't have a primary care provider to continue this prescription she has been off of this medication for the past couple of months. She was referred here preoperatively because of this and for preoperative clearance.  No Known Allergies  Current Outpatient Prescriptions  Medication Sig Dispense Refill  . aspirin-acetaminophen-caffeine (EXCEDRIN MIGRAINE) 250-250-65 MG per tablet Take 1 tablet by mouth every 6 (six) hours as needed. pain       No current facility-administered medications for this visit.    Past Medical History  Diagnosis Date  . Hypertension   . Arthritis     osteoarthritis    No past surgical history on file.    ROS:    Positive for reading glasses, nocturia, joint pains. Otherwise as stated in the HPI and negative for all other systems. PHYSICAL EXAM BP 158/98  Pulse 82  Ht 5\' 2"  (1.575 m)  Wt 234 lb 12.8 oz (106.505 kg)  BMI 42.93 kg/m2 GENERAL:  Well appearing HEENT:  Pupils equal round and reactive, fundi not visualized, oral mucosa unremarkable NECK:  No jugular venous distention, waveform within normal limits, carotid upstroke brisk and symmetric, no bruits, no thyromegaly LYMPHATICS:  No cervical, inguinal adenopathy LUNGS:  Clear to auscultation bilaterally BACK:  No CVA  tenderness CHEST:  Unremarkable HEART:  PMI not displaced or sustained,S1 and S2 within normal limits, no S3, no S4, no clicks, no rubs, no murmurs ABD:  Flat, positive bowel sounds normal in frequency in pitch, no bruits, no rebound, no guarding, no midline pulsatile mass, no hepatomegaly, no splenomegaly EXT:  2 plus pulses throughout, no edema, no cyanosis no clubbing SKIN:  No rashes no nodules NEURO:  Cranial nerves II through XII grossly intact, motor grossly intact throughout PSYCH:  Cognitively intact, oriented to person place and time  EKG:  Normal sinus rhythm, rate 92, short PR interval, QTC prolonged, no acute ST-T wave changes.   02/22/13  ASSESSMENT AND PLAN  PREOP:  The patient has no high risk findings or symptoms.  She has a moderate functional level.  She is going for procedure that is not felt to be high risk from a cardiovascular standpoint.  Therefore, based on ACC/AHA guidelines, the patient would be at acceptable risk for the planned procedure without further cardiovascular testing.  HTN:  I will restart the Norvasc. In 2 weeks she will call to let me know what her blood pressure is. If he is running high and I will likely HCTZ. We did discuss therapeutic lifestyle changes such as salt restriction, weight loss and hopefully eventually increased physical activity to improve her blood pressure.  QT PROLONGED:  The patient does have a slightly prolonged QT but no symptoms consistent with QT syndrome. She should avoid QT prolonging drugs but no further workup is suggested at this  point.

## 2013-02-18 NOTE — Patient Instructions (Addendum)
Please start Amlodipine 5 mg a day. Continue any other medications as listed.  Follow up in 1 month with Dr Antoine Poche or PA/NP

## 2013-02-22 ENCOUNTER — Ambulatory Visit (INDEPENDENT_AMBULATORY_CARE_PROVIDER_SITE_OTHER): Payer: Medicare Other | Admitting: *Deleted

## 2013-02-22 VITALS — BP 142/86 | HR 92 | Ht 62.0 in | Wt 232.8 lb

## 2013-02-22 DIAGNOSIS — I10 Essential (primary) hypertension: Secondary | ICD-10-CM

## 2013-02-22 DIAGNOSIS — M1710 Unilateral primary osteoarthritis, unspecified knee: Secondary | ICD-10-CM | POA: Diagnosis not present

## 2013-02-22 NOTE — Progress Notes (Signed)
PT  CAME IN TODAY FOR EKG AND B/P CHECK  WAS RECENTLY STARTED ON AMLODIPINE  5 MG  ENCOURAGED PT TO PURCHASE  ELECTRONIC B/P CUFF AND MONITOR  B/P PER PT  FEELS FINE  NO COMPLAINTS .Zack Seal

## 2013-03-01 ENCOUNTER — Institutional Professional Consult (permissible substitution): Payer: Medicare Other | Admitting: Cardiology

## 2013-03-18 ENCOUNTER — Encounter: Payer: Self-pay | Admitting: Physician Assistant

## 2013-03-18 ENCOUNTER — Ambulatory Visit (INDEPENDENT_AMBULATORY_CARE_PROVIDER_SITE_OTHER): Payer: Medicare Other | Admitting: Physician Assistant

## 2013-03-18 VITALS — BP 130/70 | HR 82 | Ht 62.0 in | Wt 235.0 lb

## 2013-03-18 DIAGNOSIS — R9431 Abnormal electrocardiogram [ECG] [EKG]: Secondary | ICD-10-CM | POA: Diagnosis not present

## 2013-03-18 DIAGNOSIS — I10 Essential (primary) hypertension: Secondary | ICD-10-CM

## 2013-03-18 NOTE — Progress Notes (Signed)
  [address] [place], [place]  [address] Phone: [phone] Fax:  [phone]  Date:  03/18/2013   ID:  Tiffany [name], DOB 02-02-1951, MRN [id number]  PCP:  No primary provider on file.  Cardiologist:  Dr. Rollene Rotunda     History of Present Illness: Tiffany [name] is a 62 y.o. female who returns for followup. She recently was seen by Dr. Antoine Poche for surgical clearance prior to knee replacement. No further cardiac workup was felt to be necessary. Her blood pressure is significantly elevated. He placed her Norvasc and her followup today. Patient denies chest pain, shortness of breath, syncope, significant LE edema, headaches, blurred vision, unilateral weakness or speech difficulty. She recently was seen at [facility]. She had some blood work done and will establish with Dr. Andi [name] in one to 2 months.  Labs (5/12):  K 3.7, creatinine 0.75, ALT 19 Labs (4/14):  K 3.6, creatinine 0.90, Hgb 12.6  Wt Readings from Last 3 Encounters:  03/18/13 235 lb (106.595 kg)  02/22/13 232 lb 12.8 oz (105.597 kg)  02/18/13 234 lb 12.8 oz (106.505 kg)     Past Medical History  Diagnosis Date  . Hypertension   . Arthritis     osteoarthritis    Current Outpatient Prescriptions  Medication Sig Dispense Refill  . amLODipine (NORVASC) 5 MG tablet Take 1 tablet (5 mg total) by mouth daily.  30 tablet  11  . aspirin-acetaminophen-caffeine (EXCEDRIN MIGRAINE) 250-250-65 MG per tablet Take 1 tablet by mouth every 6 (six) hours as needed. pain       No current facility-administered medications for this visit.    Allergies:   No Known Allergies  Social History:  The patient  reports that she has been smoking Cigarettes.  She has a 8 pack-year smoking history. She does not have any smokeless tobacco history on file. She reports that she does not drink alcohol or use illicit drugs.   ROS:  Please see the history of present illness.      All other systems  reviewed and negative.   PHYSICAL EXAM: VS:  BP 130/70  Pulse 82  Ht 5\' 2"  (1.575 m)  Wt 235 lb (106.595 kg)  BMI 42.97 kg/m2 Well nourished, well developed, in no acute distress HEENT: normal Neck: no JVD at 90 Cardiac:  normal S1, S2; RRR; no murmur Lungs:  clear to auscultation bilaterally, no wheezing, rhonchi or rales Abd: soft, nontender, no hepatomegaly Ext: trace left LE edema Skin: warm and dry Neuro:  CNs 2-12 intact, no focal abnormalities noted  EKG:  NSR, HR 82, QTc 467, nonspecific ST-T wave changes     ASSESSMENT AND PLAN:  1. Hypertension:  Blood pressure much better controlled. Continue current therapy. 2. Prolonged QT:  No history of syncope. Avoid QT prolonging drugs. 3. Tobacco Abuse:  We discussed the importance of cessation and different strategies for quitting.    4. Disposition:  F/u with me in 6 mos.   Signed, Tereso Newcomer, PA-C  03/18/2013 10:14 AM

## 2013-03-18 NOTE — Patient Instructions (Addendum)
Your physician wants you to follow-up in: 6 MONTHS WITH [facility], PAC. You will receive a reminder letter in the mail two months in advance. If you don't receive a letter, please call our office to schedule the follow-up appointment.   NO CHANGES WERE MADE TODAY

## 2013-03-30 ENCOUNTER — Telehealth: Payer: Self-pay | Admitting: Cardiology

## 2013-03-30 NOTE — Telephone Encounter (Signed)
The pt was cleared per 02/18/13 office visit with Dr Antoine Poche.  I left a message on Karen's voicemail to make her aware and I will fax last 2 office notes.

## 2013-03-30 NOTE — Telephone Encounter (Signed)
New problem   Karen/[facility] need to know if pt was cleared for sx to have knee surgery. Please call or fax to [phone] Attn Clydie Braun

## 2013-04-28 ENCOUNTER — Emergency Department (HOSPITAL_COMMUNITY): Payer: Medicare Other

## 2013-04-28 ENCOUNTER — Encounter (HOSPITAL_COMMUNITY): Payer: Self-pay | Admitting: Emergency Medicine

## 2013-04-28 ENCOUNTER — Emergency Department (HOSPITAL_COMMUNITY)
Admission: EM | Admit: 2013-04-28 | Discharge: 2013-04-28 | Disposition: A | Discharge status: Home / Self Care | Payer: Medicare Other | Attending: Emergency Medicine | Admitting: Emergency Medicine

## 2013-04-28 DIAGNOSIS — R059 Cough, unspecified: Secondary | ICD-10-CM | POA: Diagnosis not present

## 2013-04-28 DIAGNOSIS — R05 Cough: Secondary | ICD-10-CM | POA: Insufficient documentation

## 2013-04-28 DIAGNOSIS — I10 Essential (primary) hypertension: Secondary | ICD-10-CM | POA: Diagnosis not present

## 2013-04-28 DIAGNOSIS — M1990 Unspecified osteoarthritis, unspecified site: Secondary | ICD-10-CM | POA: Diagnosis not present

## 2013-04-28 DIAGNOSIS — M25562 Pain in left knee: Secondary | ICD-10-CM

## 2013-04-28 DIAGNOSIS — M25569 Pain in unspecified knee: Secondary | ICD-10-CM | POA: Insufficient documentation

## 2013-04-28 DIAGNOSIS — Z96659 Presence of unspecified artificial knee joint: Secondary | ICD-10-CM | POA: Diagnosis not present

## 2013-04-28 DIAGNOSIS — F17200 Nicotine dependence, unspecified, uncomplicated: Secondary | ICD-10-CM | POA: Insufficient documentation

## 2013-04-28 DIAGNOSIS — J069 Acute upper respiratory infection, unspecified: Secondary | ICD-10-CM | POA: Insufficient documentation

## 2013-04-28 LAB — CBC
HCT: 40.4 % (ref 36.0–46.0)
Hemoglobin: 12.7 g/dL (ref 12.0–15.0)
MCV: 84.9 fL (ref 78.0–100.0)
RBC: 4.76 MIL/uL (ref 3.87–5.11)
WBC: 6.3 10*3/uL (ref 4.0–10.5)

## 2013-04-28 LAB — BASIC METABOLIC PANEL
BUN: 13 mg/dL (ref 6–23)
CO2: 28 mEq/L (ref 19–32)
Chloride: 104 mEq/L (ref 96–112)
Creatinine, Ser: 0.95 mg/dL (ref 0.50–1.10)
Glucose, Bld: 104 mg/dL — ABNORMAL HIGH (ref 70–99)
Potassium: 3.6 mEq/L (ref 3.5–5.1)

## 2013-04-28 MED ORDER — BENZONATATE 100 MG PO CAPS: 100.0000 mg | ORAL_CAPSULE | Freq: Three times a day (TID) | ORAL | Status: DC

## 2013-04-28 MED ORDER — GUAIFENESIN ER 600 MG PO TB12: 1200.0000 mg | ORAL_TABLET | Freq: Two times a day (BID) | ORAL | Status: DC

## 2013-04-28 MED ORDER — NAPROXEN 500 MG PO TABS: 500.0000 mg | ORAL_TABLET | Freq: Two times a day (BID) | ORAL | Status: DC

## 2013-04-28 NOTE — ED Provider Notes (Signed)
CSN: [id number]     Arrival date & time 04/28/13  2034 History   First MD Initiated Contact with Patient 04/28/13 2246     Chief Complaint  Patient presents with  . Cough  . Knee Pain   (Consider location/radiation/quality/duration/timing/severity/associated sxs/prior Treatment) HPI Comments: 62 y/o female with a PMHx of HTN and arthritis presents to the ED complaining of cough x 2 weeks. Cough is productive with clear mucus. States her "bronchitis is acting up". She has tried taking OTC robitussin and extra-strength tylenol without relief. Admits to associated chest congestion without chest pain, sob or wheezing. Denies fever or chills. Also complaining of left knee pain. States this is chronic and she is scheduled for a knee replacement with Dr. Charlann Boxer in October. Dr. Charlann Boxer advised her to do knee exercises for pain. Has been taking ibuprofen without relief. Denies any new injury or trauma.  Patient is a 62 y.o. female presenting with cough and knee pain. The history is provided by the patient.  Cough Associated symptoms: no chest pain, no chills, no fever, no shortness of breath and no wheezing   Knee Pain Associated symptoms: no back pain and no fever     Past Medical History  Diagnosis Date  . Hypertension   . Arthritis     osteoarthritis   Past Surgical History  Procedure Laterality Date  . Knee surgery      arthroscopic right  . Toe nail removal     Family History  Problem Relation Age of Onset  . Heart disease Father     Died of CHF age 66  . Heart disease Mother     Died of CHF age 56  . Cancer Brother    History  Substance Use Topics  . Smoking status: Current Every Day Smoker -- 0.25 packs/day for 16 years    Types: Cigarettes  . Smokeless tobacco: Not on file  . Alcohol Use: No   OB History   Grav Para Term Preterm Abortions TAB SAB Ect Mult Living                 Review of Systems  Constitutional: Negative for fever and chills.  Respiratory: Positive for  cough. Negative for shortness of breath and wheezing.   Cardiovascular: Negative for chest pain.  Musculoskeletal: Negative for back pain.       Positive for left knee pain.  All other systems reviewed and are negative.    Allergies  Review of patient's allergies indicates no known allergies.  Home Medications   Current Outpatient Rx  Name  Route  Sig  Dispense  Refill  . amLODipine (NORVASC) 5 MG tablet   Oral   Take 1 tablet (5 mg total) by mouth daily.   30 tablet   11   . aspirin-acetaminophen-caffeine (EXCEDRIN MIGRAINE) 250-250-65 MG per tablet   Oral   Take 1 tablet by mouth every 6 (six) hours as needed. pain         . ibuprofen (ADVIL,MOTRIN) 200 MG tablet   Oral   Take 400 mg by mouth every 6 (six) hours as needed for pain.          BP 146/109  Pulse 88  Temp(Src) 98.9 F (37.2 C) (Oral)  Resp 18  Ht 5\' 2"  (1.575 m)  Wt 226 lb (102.513 kg)  BMI 41.33 kg/m2  SpO2 100% Physical Exam  Nursing note and vitals reviewed. Constitutional: She is oriented to person, place, and time. She appears well-developed  and well-nourished. No distress.  HENT:  Head: Normocephalic and atraumatic.  Mouth/Throat: Oropharynx is clear and moist.  Eyes: Conjunctivae are normal.  Neck: Normal range of motion. Neck supple. No JVD present. No tracheal deviation present.  Cardiovascular: Normal rate, regular rhythm and normal heart sounds.   Pulmonary/Chest: Effort normal and breath sounds normal. No respiratory distress. She has no wheezes. She has no rhonchi. She has no rales.  Musculoskeletal: Normal range of motion. She exhibits no edema.  Full ROM of left knee. Tenderness at medial joint line. No edema, bruising, deformity.  Neurological: She is alert and oriented to person, place, and time.  Skin: Skin is warm and dry. She is not diaphoretic.  Psychiatric: She has a normal mood and affect. Her behavior is normal.    ED Course  Procedures (including critical care  time) Labs Review Labs Reviewed  BASIC METABOLIC PANEL - Abnormal; Notable for the following:    Glucose, Bld 104 (*)    GFR calc non Af Amer 63 (*)    GFR calc Af Amer 73 (*)    All other components within normal limits  CBC   Imaging Review Dg Chest 2 View  04/28/2013   *RADIOLOGY REPORT*  Clinical Data: Cough  CHEST - 2 VIEW  Comparison: November 23, 2012  Findings: There is no focal infiltrate, pulmonary edema, or pleural effusion.  The mediastinal contour and cardiac silhouette are normal.  The soft tissues and osseous structures are stable.  IMPRESSION: No acute cardiopulmonary disease identified.   Original Report Authenticated By: Sherian Rein, M.D.    MDM   1. Cough   2. URI (upper respiratory infection)   3. Knee pain, left    Patient with cough, URI. Labs obtained in triage prior to patient being seen, CBC, BMP which are unremarkable. Chest x-ray obtained in triage prior to patient being seen which was negative for any acute finding. Lungs clear. Patient appears in no apparent distress. Rx tessalon for cough, mucinex for congestion. Naproxen for knee pain. Advised her to f/u with Dr. Charlann Boxer. Return precautions discussed. Patient states understanding of treatment care plan and is agreeable.     Trevor Mace, PA-C 04/28/13 2319

## 2013-04-28 NOTE — ED Notes (Signed)
Pt stated that she has had a cough for the past 2 weeks and "believes her bronchitis is acting up." Pt stated that her L knee has also been hurting her, with a scheduled surgery for the same.

## 2013-05-02 NOTE — ED Provider Notes (Signed)
Medical screening examination/treatment/procedure(s) were performed by non-physician practitioner and as supervising physician I was immediately available for consultation/collaboration.   Claudean Kinds, MD 05/02/13 [phone]

## 2013-05-09 ENCOUNTER — Inpatient Hospital Stay: Admit: 2013-05-09 | Payer: Self-pay | Admitting: Orthopedic Surgery

## 2013-05-09 SURGERY — ARTHROPLASTY, KNEE, TOTAL
Anesthesia: Spinal | Site: Knee | Laterality: Left

## 2013-05-18 ENCOUNTER — Encounter (HOSPITAL_COMMUNITY): Payer: Self-pay | Admitting: Pharmacy Technician

## 2013-05-20 ENCOUNTER — Other Ambulatory Visit (HOSPITAL_COMMUNITY): Payer: Self-pay | Admitting: *Deleted

## 2013-05-23 ENCOUNTER — Encounter (HOSPITAL_COMMUNITY)
Admission: RE | Admit: 2013-05-23 | Discharge: 2013-05-23 | Disposition: A | Discharge status: Home / Self Care | Payer: Medicare Other | Source: Ambulatory Visit | Attending: Orthopedic Surgery | Admitting: Orthopedic Surgery

## 2013-05-23 ENCOUNTER — Encounter (HOSPITAL_COMMUNITY): Payer: Self-pay

## 2013-05-23 DIAGNOSIS — Z01812 Encounter for preprocedural laboratory examination: Secondary | ICD-10-CM | POA: Insufficient documentation

## 2013-05-23 DIAGNOSIS — Z01818 Encounter for other preprocedural examination: Secondary | ICD-10-CM | POA: Insufficient documentation

## 2013-05-23 HISTORY — DX: Personal history of other diseases of the respiratory system: Z87.09

## 2013-05-23 LAB — BASIC METABOLIC PANEL
BUN: 10 mg/dL (ref 6–23)
CO2: 29 mEq/L (ref 19–32)
Chloride: 103 mEq/L (ref 96–112)
Creatinine, Ser: 0.76 mg/dL (ref 0.50–1.10)
GFR calc Af Amer: >90 mL/min (ref >90)
Potassium: 3.5 mEq/L (ref 3.5–5.1)

## 2013-05-23 LAB — URINALYSIS, ROUTINE W REFLEX MICROSCOPIC
Glucose, UA: NEGATIVE mg/dL
Ketones, ur: NEGATIVE mg/dL
Nitrite: NEGATIVE
Protein, ur: NEGATIVE mg/dL

## 2013-05-23 LAB — CBC
HCT: 38.8 % (ref 36.0–46.0)
Hemoglobin: 12.6 g/dL (ref 12.0–15.0)
MCHC: 32.5 g/dL (ref 30.0–36.0)
MCV: 84.7 fL (ref 78.0–100.0)
RDW: 14.4 % (ref 11.5–15.5)
WBC: 6.9 10*3/uL (ref 4.0–10.5)

## 2013-05-23 LAB — URINE MICROSCOPIC-ADD ON

## 2013-05-23 LAB — PROTIME-INR: INR: 1.05 (ref 0.00–1.49)

## 2013-05-23 LAB — ABO/RH: ABO/RH(D): O POS

## 2013-05-23 NOTE — Progress Notes (Signed)
Abnormal UA faxed to Dr.Olin 

## 2013-05-23 NOTE — Progress Notes (Signed)
05/23/13 1015  OBSTRUCTIVE SLEEP APNEA  Have you ever been diagnosed with sleep apnea through a sleep study? No  Do you snore loudly (loud enough to be heard through closed doors)?  1  Do you often feel tired, fatigued, or sleepy during the daytime? 0  Has anyone observed you stop breathing during your sleep? 0  Do you have, or are you being treated for high blood pressure? 1  BMI more than 35 kg/m2? 1  Age over 62 years old? 1  Neck circumference greater than 40 cm/18 inches? 0  Gender: 0  Obstructive Sleep Apnea Score 4  Score 4 or greater  Results sent to PCP

## 2013-05-23 NOTE — Patient Instructions (Signed)
Montia A Curbow  05/23/2013                           YOUR PROCEDURE IS SCHEDULED ON:  05/31/13               PLEASE REPORT TO SHORT STAY CENTER AT :  6:00 AM               CALL THIS NUMBER IF ANY PROBLEMS THE DAY OF SURGERY :               [phone]                      REMEMBER:   Do not eat food or drink liquids AFTER MIDNIGHT   Take these medicines the morning of surgery with A SIP OF WATER:  AMLODIPINE   Do not wear jewelry, make-up   Do not wear lotions, powders, or perfumes.   Do not shave legs or underarms 12 hrs. before surgery (men may shave face)  Do not bring valuables to the hospital.  Contacts, dentures or bridgework may not be worn into surgery.  Leave suitcase in the car. After surgery it may be brought to your room.  For patients admitted to the hospital more than one night, checkout time is 11:00                          The day of discharge.   Patients discharged the day of surgery will not be allowed to drive home                             If going home same day of surgery, must have someone stay with you first                           24 hrs at home and arrange for some one to drive you home from hospital.    Special Instructions:   Please read over the following fact sheets that you were given:               1. MRSA  INFORMATION                      2. [place] PREPARING FOR SURGERY SHEET               3. INCENTIVE SPIROMETER                                                X_____________________________________________________________________        Failure to follow these instructions may result in cancellation of your surgery

## 2013-05-24 LAB — URINE CULTURE

## 2013-05-25 LAB — MRSA CULTURE

## 2013-05-25 NOTE — H&P (Signed)
TOTAL KNEE ADMISSION H&P  Patient is being admitted for left total knee arthroplasty.  Subjective:  Chief Complaint:   Left knee OA / pain.  HPI: Tiffany [name], 62 y.o. female, has a history of pain and functional disability in the left knee due to arthritis and has failed non-surgical conservative treatments for greater than 12 weeks to includeNSAID's and/or analgesics, corticosteriod injections, use of assistive devices and activity modification.  Onset of symptoms was gradual, starting >10 years ago with gradually worsening course since that time. The patient noted no past surgery on the left knee(s).  Patient currently rates pain in the left knee(s) at 10 out of 10 with activity. Patient has night pain, worsening of pain with activity and weight bearing, pain that interferes with activities of daily living, pain with passive range of motion, crepitus and joint swelling.  Patient has evidence of periarticular osteophytes and joint space narrowing by imaging studies. There is no active infection.  Risks, benefits and expectations were discussed with the patient. Patient understand the risks, benefits and expectations and wishes to proceed with surgery.   D/C Plans:    SNF ([facility])  Post-op Meds:   No Rx given  Tranexamic Acid:       To be given  Decadron:    To be given  FYI:    ASA post-op  Norco post-op   Patient Active Problem List   Diagnosis Date Noted  . Preop cardiovascular exam 02/18/2013   Past Medical History  Diagnosis Date  . Hypertension   . Arthritis     osteoarthritis  . Hx of bronchitis     Past Surgical History  Procedure Laterality Date  . Knee surgery      arthroscopic right  . Toe nail removal      No prescriptions prior to admission   No Known Allergies   History  Substance Use Topics  . Smoking status: Current Every Day Smoker -- 0.25 packs/day for 16 years    Types: Cigarettes  . Smokeless tobacco: Not on file  . Alcohol Use: No     Family History  Problem Relation Age of Onset  . Heart disease Father     Died of CHF age 68  . Heart disease Mother     Died of CHF age 69  . Cancer Brother      Review of Systems  Constitutional: Negative.   HENT: Negative.   Eyes: Negative.   Respiratory: Negative.   Cardiovascular: Negative.   Gastrointestinal: Negative.   Genitourinary: Negative.   Musculoskeletal: Positive for joint pain.  Skin: Negative.   Neurological: Negative.   Endo/Heme/Allergies: Negative.   Psychiatric/Behavioral: Negative.     Objective:  Physical Exam  Constitutional: She is oriented to person, place, and time. She appears well-developed and well-nourished.  HENT:  Head: Normocephalic and atraumatic.  Mouth/Throat: Oropharynx is clear and moist.  Eyes: Conjunctivae are normal. Pupils are equal, round, and reactive to light.  Neck: Neck supple. No JVD present. No tracheal deviation present. No thyromegaly present.  Cardiovascular: Normal rate, regular rhythm, normal heart sounds and intact distal pulses.   Respiratory: Effort normal and breath sounds normal. No stridor. No respiratory distress. She has no wheezes.  GI: Soft. There is no tenderness. There is no guarding.  Musculoskeletal:       Left knee: She exhibits decreased range of motion, swelling, effusion, abnormal alignment and bony tenderness. She exhibits no ecchymosis, no deformity, no laceration and no erythema. Tenderness  found.  Lymphadenopathy:    She has no cervical adenopathy.  Neurological: She is alert and oriented to person, place, and time.  Skin: Skin is warm and dry.  Psychiatric: She has a normal mood and affect.    Labs:  Estimated body mass index is 42.97 kg/(m^2) as calculated from the following:   Height as of 03/18/13: 5\' 2"  (1.575 m).   Weight as of 03/18/13: 106.595 kg (235 lb).   Imaging Review Plain radiographs demonstrate severe degenerative joint disease of the left knee(s). The overall alignment  issignificant varus. The bone quality appears to be good for age and reported activity level.  Assessment/Plan:  End stage arthritis, left knee   The patient history, physical examination, clinical judgment of the provider and imaging studies are consistent with end stage degenerative joint disease of the left knee(s) and total knee arthroplasty is deemed medically necessary. The treatment options including medical management, injection therapy arthroscopy and arthroplasty were discussed at length. The risks and benefits of total knee arthroplasty were presented and reviewed. The risks due to aseptic loosening, infection, stiffness, patella tracking problems, thromboembolic complications and other imponderables were discussed. The patient acknowledged the explanation, agreed to proceed with the plan and consent was signed. Patient is being admitted for inpatient treatment for surgery, pain control, PT, OT, prophylactic antibiotics, VTE prophylaxis, progressive ambulation and ADL's and discharge planning. The patient is planning to be discharged to skilled nursing facility.    Anastasio Auerbach Torsten Weniger   PAC  05/25/2013, 1:02 PM

## 2013-05-31 ENCOUNTER — Encounter (HOSPITAL_COMMUNITY): Payer: Self-pay | Admitting: *Deleted

## 2013-05-31 ENCOUNTER — Encounter (HOSPITAL_COMMUNITY)
Admission: RE | Disposition: A | Discharge status: Skilled Nursing Facility | Payer: Self-pay | Source: Ambulatory Visit | Attending: Orthopedic Surgery

## 2013-05-31 ENCOUNTER — Encounter (HOSPITAL_COMMUNITY): Payer: Self-pay | Admitting: Registered Nurse

## 2013-05-31 ENCOUNTER — Inpatient Hospital Stay (HOSPITAL_COMMUNITY): Payer: Medicare Other | Admitting: Registered Nurse

## 2013-05-31 ENCOUNTER — Inpatient Hospital Stay (HOSPITAL_COMMUNITY)
Admission: RE | Admit: 2013-05-31 | Discharge: 2013-06-03 | DRG: 470 | Disposition: A | Discharge status: Skilled Nursing Facility | Payer: Medicare Other | Source: Ambulatory Visit | Attending: Orthopedic Surgery | Admitting: Orthopedic Surgery

## 2013-05-31 DIAGNOSIS — Z6841 Body Mass Index (BMI) 40.0 and over, adult: Secondary | ICD-10-CM

## 2013-05-31 DIAGNOSIS — Z5189 Encounter for other specified aftercare: Secondary | ICD-10-CM | POA: Diagnosis not present

## 2013-05-31 DIAGNOSIS — IMO0002 Reserved for concepts with insufficient information to code with codable children: Secondary | ICD-10-CM | POA: Diagnosis not present

## 2013-05-31 DIAGNOSIS — M898X9 Other specified disorders of bone, unspecified site: Secondary | ICD-10-CM | POA: Diagnosis present

## 2013-05-31 DIAGNOSIS — Z01812 Encounter for preprocedural laboratory examination: Secondary | ICD-10-CM | POA: Diagnosis not present

## 2013-05-31 DIAGNOSIS — M62838 Other muscle spasm: Secondary | ICD-10-CM | POA: Diagnosis present

## 2013-05-31 DIAGNOSIS — M21869 Other specified acquired deformities of unspecified lower leg: Secondary | ICD-10-CM | POA: Diagnosis present

## 2013-05-31 DIAGNOSIS — I10 Essential (primary) hypertension: Secondary | ICD-10-CM | POA: Diagnosis present

## 2013-05-31 DIAGNOSIS — M1710 Unilateral primary osteoarthritis, unspecified knee: Secondary | ICD-10-CM | POA: Diagnosis not present

## 2013-05-31 DIAGNOSIS — D62 Acute posthemorrhagic anemia: Secondary | ICD-10-CM | POA: Diagnosis not present

## 2013-05-31 DIAGNOSIS — Z96659 Presence of unspecified artificial knee joint: Secondary | ICD-10-CM

## 2013-05-31 DIAGNOSIS — Z96652 Presence of left artificial knee joint: Secondary | ICD-10-CM

## 2013-05-31 DIAGNOSIS — M25569 Pain in unspecified knee: Secondary | ICD-10-CM | POA: Diagnosis not present

## 2013-05-31 DIAGNOSIS — S8990XA Unspecified injury of unspecified lower leg, initial encounter: Secondary | ICD-10-CM | POA: Diagnosis not present

## 2013-05-31 DIAGNOSIS — D500 Iron deficiency anemia secondary to blood loss (chronic): Secondary | ICD-10-CM | POA: Diagnosis not present

## 2013-05-31 DIAGNOSIS — M1990 Unspecified osteoarthritis, unspecified site: Secondary | ICD-10-CM | POA: Diagnosis not present

## 2013-05-31 DIAGNOSIS — J209 Acute bronchitis, unspecified: Secondary | ICD-10-CM | POA: Diagnosis not present

## 2013-05-31 DIAGNOSIS — M159 Polyosteoarthritis, unspecified: Secondary | ICD-10-CM | POA: Diagnosis not present

## 2013-05-31 DIAGNOSIS — Z5333 Arthroscopic surgical procedure converted to open procedure: Secondary | ICD-10-CM | POA: Diagnosis not present

## 2013-05-31 HISTORY — PX: TOTAL KNEE ARTHROPLASTY: SHX125

## 2013-05-31 LAB — TYPE AND SCREEN: ABO/RH(D): O POS

## 2013-05-31 SURGERY — ARTHROPLASTY, KNEE, TOTAL
Anesthesia: General | Site: Knee | Laterality: Left | Wound class: Clean

## 2013-05-31 MED ORDER — MENTHOL 3 MG MT LOZG: 1.0000 | LOZENGE | OROMUCOSAL | Status: DC | PRN

## 2013-05-31 MED ORDER — ONDANSETRON HCL 4 MG/2ML IJ SOLN
INTRAMUSCULAR | Status: DC | PRN
  Administered 2013-05-31: 4 mg via INTRAMUSCULAR

## 2013-05-31 MED ORDER — SODIUM CHLORIDE 0.9 % IJ SOLN
INTRAMUSCULAR | Status: AC
  Filled 2013-05-31: qty 50

## 2013-05-31 MED ORDER — FENTANYL CITRATE 0.05 MG/ML IJ SOLN
INTRAMUSCULAR | Status: DC | PRN
  Administered 2013-05-31 (×5): 50 ug via INTRAVENOUS

## 2013-05-31 MED ORDER — LACTATED RINGERS IV SOLN
INTRAVENOUS | Status: DC | PRN
  Administered 2013-05-31 (×2): via INTRAVENOUS

## 2013-05-31 MED ORDER — FERROUS SULFATE 325 (65 FE) MG PO TABS
325.0000 mg | ORAL_TABLET | Freq: Three times a day (TID) | ORAL | Status: DC
  Administered 2013-05-31 – 2013-06-03 (×7): 325 mg via ORAL
  Filled 2013-05-31 (×11): qty 1

## 2013-05-31 MED ORDER — CHLORHEXIDINE GLUCONATE 4 % EX LIQD
60.0000 mL | Freq: Once | CUTANEOUS | Status: DC
  Filled 2013-05-31: qty 60

## 2013-05-31 MED ORDER — BUPIVACAINE LIPOSOME 1.3 % IJ SUSP
20.0000 mL | Freq: Once | INTRAMUSCULAR | Status: AC
  Administered 2013-05-31: 20 mL
  Filled 2013-05-31: qty 20

## 2013-05-31 MED ORDER — SUCCINYLCHOLINE CHLORIDE 20 MG/ML IJ SOLN
INTRAMUSCULAR | Status: DC | PRN
  Administered 2013-05-31: 100 mg via INTRAVENOUS

## 2013-05-31 MED ORDER — DEXAMETHASONE SODIUM PHOSPHATE 10 MG/ML IJ SOLN
10.0000 mg | Freq: Once | INTRAMUSCULAR | Status: AC
  Administered 2013-06-01: 11:00:00 10 mg via INTRAVENOUS
  Filled 2013-05-31: qty 1

## 2013-05-31 MED ORDER — ONDANSETRON HCL 4 MG/2ML IJ SOLN: 4.0000 mg | Freq: Four times a day (QID) | INTRAMUSCULAR | Status: DC | PRN

## 2013-05-31 MED ORDER — METOCLOPRAMIDE HCL 5 MG/ML IJ SOLN: 5.0000 mg | Freq: Three times a day (TID) | INTRAMUSCULAR | Status: DC | PRN

## 2013-05-31 MED ORDER — TRANEXAMIC ACID 100 MG/ML IV SOLN
1000.0000 mg | Freq: Once | INTRAVENOUS | Status: AC
  Administered 2013-05-31: 1000 mg via INTRAVENOUS
  Filled 2013-05-31: qty 10

## 2013-05-31 MED ORDER — BUPIVACAINE-EPINEPHRINE 0.25% -1:200000 IJ SOLN
INTRAMUSCULAR | Status: DC | PRN
  Administered 2013-05-31: 25 mL

## 2013-05-31 MED ORDER — INFLUENZA VAC SPLIT QUAD 0.5 ML IM SUSP
0.5000 mL | INTRAMUSCULAR | Status: AC
  Administered 2013-06-01: 11:00:00 0.5 mL via INTRAMUSCULAR
  Filled 2013-05-31 (×2): qty 0.5

## 2013-05-31 MED ORDER — DIPHENHYDRAMINE HCL 25 MG PO CAPS: 25.0000 mg | ORAL_CAPSULE | Freq: Four times a day (QID) | ORAL | Status: DC | PRN

## 2013-05-31 MED ORDER — ALUM & MAG HYDROXIDE-SIMETH 200-200-20 MG/5ML PO SUSP: 30.0000 mL | ORAL | Status: DC | PRN

## 2013-05-31 MED ORDER — ZOLPIDEM TARTRATE 5 MG PO TABS: 5.0000 mg | ORAL_TABLET | Freq: Every evening | ORAL | Status: DC | PRN

## 2013-05-31 MED ORDER — HYDROMORPHONE HCL PF 1 MG/ML IJ SOLN: 0.2500 mg | INTRAMUSCULAR | Status: DC | PRN

## 2013-05-31 MED ORDER — HYDROMORPHONE HCL PF 1 MG/ML IJ SOLN
INTRAMUSCULAR | Status: DC | PRN
  Administered 2013-05-31 (×4): 0.5 mg via INTRAVENOUS

## 2013-05-31 MED ORDER — MEPERIDINE HCL 50 MG/ML IJ SOLN: 6.2500 mg | INTRAMUSCULAR | Status: DC | PRN

## 2013-05-31 MED ORDER — HYDROCODONE-ACETAMINOPHEN 7.5-325 MG PO TABS
1.0000 | ORAL_TABLET | ORAL | Status: DC
  Administered 2013-05-31 (×3): 2 via ORAL
  Administered 2013-06-01: 19:00:00 1 via ORAL
  Administered 2013-06-01 – 2013-06-03 (×10): 2 via ORAL
  Filled 2013-05-31 (×14): qty 2

## 2013-05-31 MED ORDER — MIDAZOLAM HCL 5 MG/5ML IJ SOLN
INTRAMUSCULAR | Status: DC | PRN
  Administered 2013-05-31: 2 mg via INTRAVENOUS

## 2013-05-31 MED ORDER — METOCLOPRAMIDE HCL 10 MG PO TABS: 5.0000 mg | ORAL_TABLET | Freq: Three times a day (TID) | ORAL | Status: DC | PRN

## 2013-05-31 MED ORDER — HYDROMORPHONE HCL PF 1 MG/ML IJ SOLN
0.5000 mg | INTRAMUSCULAR | Status: DC | PRN
Start: 2013-05-31 — End: 2013-06-03
  Administered 2013-05-31: 0.5 mg via INTRAVENOUS
  Administered 2013-05-31: 18:00:00 1 mg via INTRAVENOUS
  Filled 2013-05-31 (×2): qty 1

## 2013-05-31 MED ORDER — CEFAZOLIN SODIUM-DEXTROSE 2-3 GM-% IV SOLR
INTRAVENOUS | Status: AC
  Filled 2013-05-31: qty 50

## 2013-05-31 MED ORDER — NEOSTIGMINE METHYLSULFATE 1 MG/ML IJ SOLN
INTRAMUSCULAR | Status: DC | PRN
  Administered 2013-05-31: 5 mg via INTRAVENOUS

## 2013-05-31 MED ORDER — POLYETHYLENE GLYCOL 3350 17 G PO PACK: 17.0000 g | PACK | Freq: Two times a day (BID) | ORAL | Status: DC

## 2013-05-31 MED ORDER — DOCUSATE SODIUM 100 MG PO CAPS
100.0000 mg | ORAL_CAPSULE | Freq: Two times a day (BID) | ORAL | Status: DC
  Administered 2013-05-31 – 2013-06-03 (×6): 100 mg via ORAL

## 2013-05-31 MED ORDER — SODIUM CHLORIDE 0.9 % IR SOLN
Status: DC | PRN
  Administered 2013-05-31: 1000 mL

## 2013-05-31 MED ORDER — PROMETHAZINE HCL 25 MG/ML IJ SOLN: 6.2500 mg | INTRAMUSCULAR | Status: DC | PRN

## 2013-05-31 MED ORDER — OXYCODONE HCL 5 MG/5ML PO SOLN
5.0000 mg | Freq: Once | ORAL | Status: DC | PRN
  Filled 2013-05-31: qty 5

## 2013-05-31 MED ORDER — METHOCARBAMOL 100 MG/ML IJ SOLN
500.0000 mg | Freq: Four times a day (QID) | INTRAMUSCULAR | Status: DC | PRN
  Administered 2013-05-31: 15:00:00 500 mg via INTRAVENOUS
  Filled 2013-05-31: qty 5

## 2013-05-31 MED ORDER — ONDANSETRON HCL 4 MG PO TABS: 4.0000 mg | ORAL_TABLET | Freq: Four times a day (QID) | ORAL | Status: DC | PRN

## 2013-05-31 MED ORDER — PHENOL 1.4 % MT LIQD: 1.0000 | OROMUCOSAL | Status: DC | PRN

## 2013-05-31 MED ORDER — GLYCOPYRROLATE 0.2 MG/ML IJ SOLN
INTRAMUSCULAR | Status: DC | PRN
  Administered 2013-05-31: .8 mg via INTRAVENOUS

## 2013-05-31 MED ORDER — CEFAZOLIN SODIUM-DEXTROSE 2-3 GM-% IV SOLR
2.0000 g | INTRAVENOUS | Status: AC
  Administered 2013-05-31: 2 g via INTRAVENOUS

## 2013-05-31 MED ORDER — SODIUM CHLORIDE 0.9 % IV SOLN
INTRAVENOUS | Status: DC
  Administered 2013-05-31 – 2013-06-01 (×2): via INTRAVENOUS
  Filled 2013-05-31 (×14): qty 1000

## 2013-05-31 MED ORDER — BISACODYL 10 MG RE SUPP
10.0000 mg | Freq: Every day | RECTAL | Status: DC | PRN
  Filled 2013-05-31: qty 1

## 2013-05-31 MED ORDER — CEFAZOLIN SODIUM-DEXTROSE 2-3 GM-% IV SOLR
2.0000 g | Freq: Four times a day (QID) | INTRAVENOUS | Status: AC
  Administered 2013-05-31 (×2): 2 g via INTRAVENOUS
  Filled 2013-05-31 (×2): qty 50

## 2013-05-31 MED ORDER — METHOCARBAMOL 500 MG PO TABS
500.0000 mg | ORAL_TABLET | Freq: Four times a day (QID) | ORAL | Status: DC | PRN
  Administered 2013-05-31 – 2013-06-03 (×4): 500 mg via ORAL
  Filled 2013-05-31 (×4): qty 1

## 2013-05-31 MED ORDER — PROPOFOL 10 MG/ML IV BOLUS
INTRAVENOUS | Status: DC | PRN
  Administered 2013-05-31: 200 mg via INTRAVENOUS

## 2013-05-31 MED ORDER — DEXAMETHASONE SODIUM PHOSPHATE 10 MG/ML IJ SOLN: 10.0000 mg | Freq: Once | INTRAMUSCULAR | Status: DC

## 2013-05-31 MED ORDER — 0.9 % SODIUM CHLORIDE (POUR BTL) OPTIME
TOPICAL | Status: DC | PRN
  Administered 2013-05-31: 1000 mL

## 2013-05-31 MED ORDER — FLEET ENEMA 7-19 GM/118ML RE ENEM: 1.0000 | ENEMA | Freq: Once | RECTAL | Status: AC | PRN

## 2013-05-31 MED ORDER — KETOROLAC TROMETHAMINE 30 MG/ML IJ SOLN
INTRAMUSCULAR | Status: DC | PRN
  Administered 2013-05-31: 30 mg via INTRAVENOUS

## 2013-05-31 MED ORDER — ROCURONIUM BROMIDE 100 MG/10ML IV SOLN
INTRAVENOUS | Status: DC | PRN
  Administered 2013-05-31: 20 mg via INTRAVENOUS

## 2013-05-31 MED ORDER — SODIUM CHLORIDE 0.9 % IJ SOLN
INTRAMUSCULAR | Status: DC | PRN
  Administered 2013-05-31: 50 mL via INTRAVENOUS

## 2013-05-31 MED ORDER — POLYETHYLENE GLYCOL 3350 17 G PO PACK
17.0000 g | PACK | Freq: Two times a day (BID) | ORAL | Status: DC
  Administered 2013-05-31 – 2013-06-03 (×6): 17 g via ORAL

## 2013-05-31 MED ORDER — LIDOCAINE HCL (CARDIAC) 20 MG/ML IV SOLN
INTRAVENOUS | Status: DC | PRN
  Administered 2013-05-31: 80 mg via INTRAVENOUS

## 2013-05-31 MED ORDER — ASPIRIN EC 325 MG PO TBEC
325.0000 mg | DELAYED_RELEASE_TABLET | Freq: Two times a day (BID) | ORAL | Status: DC
  Administered 2013-06-01 – 2013-06-03 (×5): 325 mg via ORAL
  Filled 2013-05-31 (×8): qty 1

## 2013-05-31 MED ORDER — CELECOXIB 200 MG PO CAPS
200.0000 mg | ORAL_CAPSULE | Freq: Two times a day (BID) | ORAL | Status: DC
  Administered 2013-05-31 – 2013-06-03 (×6): 200 mg via ORAL
  Filled 2013-05-31 (×7): qty 1

## 2013-05-31 MED ORDER — BUPIVACAINE-EPINEPHRINE PF 0.25-1:200000 % IJ SOLN
INTRAMUSCULAR | Status: AC
  Filled 2013-05-31: qty 30

## 2013-05-31 MED ORDER — DEXAMETHASONE SODIUM PHOSPHATE 10 MG/ML IJ SOLN
INTRAMUSCULAR | Status: DC | PRN
  Administered 2013-05-31: 10 mg via INTRAVENOUS

## 2013-05-31 MED ORDER — OXYCODONE HCL 5 MG PO TABS: 5.0000 mg | ORAL_TABLET | Freq: Once | ORAL | Status: DC | PRN

## 2013-05-31 MED ORDER — AMLODIPINE BESYLATE 5 MG PO TABS
5.0000 mg | ORAL_TABLET | Freq: Every day | ORAL | Status: DC
  Administered 2013-06-01 – 2013-06-03 (×3): 5 mg via ORAL
  Filled 2013-05-31 (×5): qty 1

## 2013-05-31 SURGICAL SUPPLY — 58 items
ADH SKN CLS APL DERMABOND .7 (GAUZE/BANDAGES/DRESSINGS) ×1
BAG SPEC THK2 15X12 ZIP CLS (MISCELLANEOUS) ×1
BAG ZIPLOCK 12X15 (MISCELLANEOUS) ×2 IMPLANT
BANDAGE ELASTIC 6 VELCRO ST LF (GAUZE/BANDAGES/DRESSINGS) ×2 IMPLANT
BANDAGE ESMARK 6X9 LF (GAUZE/BANDAGES/DRESSINGS) ×1 IMPLANT
BLADE SAW SGTL 13.0X1.19X90.0M (BLADE) ×2 IMPLANT
BNDG CMPR 9X6 STRL LF SNTH (GAUZE/BANDAGES/DRESSINGS) ×1
BNDG ESMARK 6X9 LF (GAUZE/BANDAGES/DRESSINGS) ×2
BOWL SMART MIX CTS (DISPOSABLE) ×2 IMPLANT
CAPT RP KNEE ×1 IMPLANT
CEMENT HV SMART SET (Cement) ×2 IMPLANT
CLOTH BEACON ORANGE TIMEOUT ST (SAFETY) ×2 IMPLANT
CUFF TOURN SGL QUICK 34 (TOURNIQUET CUFF) ×2
CUFF TRNQT CYL 34X4X40X1 (TOURNIQUET CUFF) ×1 IMPLANT
DECANTER SPIKE VIAL GLASS SM (MISCELLANEOUS) ×2 IMPLANT
DERMABOND ADVANCED (GAUZE/BANDAGES/DRESSINGS) ×1
DERMABOND ADVANCED .7 DNX12 (GAUZE/BANDAGES/DRESSINGS) ×1 IMPLANT
DRAPE EXTREMITY T 121X128X90 (DRAPE) ×2 IMPLANT
DRAPE POUCH INSTRU U-SHP 10X18 (DRAPES) ×2 IMPLANT
DRAPE U-SHAPE 47X51 STRL (DRAPES) ×2 IMPLANT
DRSG AQUACEL AG ADV 3.5X10 (GAUZE/BANDAGES/DRESSINGS) ×2 IMPLANT
DRSG TEGADERM 4X4.75 (GAUZE/BANDAGES/DRESSINGS) ×2 IMPLANT
DURAPREP 26ML APPLICATOR (WOUND CARE) ×2 IMPLANT
ELECT REM PT RETURN 9FT ADLT (ELECTROSURGICAL) ×2
ELECTRODE REM PT RTRN 9FT ADLT (ELECTROSURGICAL) ×1 IMPLANT
EVACUATOR 1/8 PVC DRAIN (DRAIN) ×2 IMPLANT
FACESHIELD LNG OPTICON STERILE (SAFETY) ×10 IMPLANT
GAUZE SPONGE 2X2 8PLY STRL LF (GAUZE/BANDAGES/DRESSINGS) ×1 IMPLANT
GLOVE BIOGEL PI IND STRL 7.5 (GLOVE) ×1 IMPLANT
GLOVE BIOGEL PI IND STRL 8 (GLOVE) ×1 IMPLANT
GLOVE BIOGEL PI INDICATOR 7.5 (GLOVE) ×1
GLOVE BIOGEL PI INDICATOR 8 (GLOVE) ×1
GLOVE ECLIPSE 8.0 STRL XLNG CF (GLOVE) ×2 IMPLANT
GLOVE ORTHO TXT STRL SZ7.5 (GLOVE) ×4 IMPLANT
GOWN BRE IMP PREV XXLGXLNG (GOWN DISPOSABLE) ×2 IMPLANT
GOWN PREVENTION PLUS LG XLONG (DISPOSABLE) ×2 IMPLANT
HANDPIECE INTERPULSE COAX TIP (DISPOSABLE) ×2
KIT BASIN OR (CUSTOM PROCEDURE TRAY) ×2 IMPLANT
MANIFOLD NEPTUNE II (INSTRUMENTS) ×2 IMPLANT
NDL SAFETY ECLIPSE 18X1.5 (NEEDLE) ×1 IMPLANT
NEEDLE HYPO 18GX1.5 SHARP (NEEDLE) ×2
NS IRRIG 1000ML POUR BTL (IV SOLUTION) ×4 IMPLANT
PACK TOTAL JOINT (CUSTOM PROCEDURE TRAY) ×2 IMPLANT
POSITIONER SURGICAL ARM (MISCELLANEOUS) ×2 IMPLANT
SET HNDPC FAN SPRY TIP SCT (DISPOSABLE) ×1 IMPLANT
SET PAD KNEE POSITIONER (MISCELLANEOUS) ×2 IMPLANT
SPONGE GAUZE 2X2 STER 10/PKG (GAUZE/BANDAGES/DRESSINGS) ×1
SUCTION FRAZIER 12FR DISP (SUCTIONS) ×2 IMPLANT
SUT MNCRL AB 4-0 PS2 18 (SUTURE) ×2 IMPLANT
SUT VIC AB 1 CT1 36 (SUTURE) ×2 IMPLANT
SUT VIC AB 2-0 CT1 27 (SUTURE) ×6
SUT VIC AB 2-0 CT1 TAPERPNT 27 (SUTURE) ×3 IMPLANT
SUT VLOC 180 0 24IN GS25 (SUTURE) ×2 IMPLANT
SYR 50ML LL SCALE MARK (SYRINGE) ×2 IMPLANT
TOWEL OR 17X26 10 PK STRL BLUE (TOWEL DISPOSABLE) ×4 IMPLANT
TRAY FOLEY CATH 14FRSI W/METER (CATHETERS) ×2 IMPLANT
WATER STERILE IRR 1500ML POUR (IV SOLUTION) ×2 IMPLANT
WRAP KNEE MAXI GEL POST OP (GAUZE/BANDAGES/DRESSINGS) ×2 IMPLANT

## 2013-05-31 NOTE — Evaluation (Signed)
Physical Therapy Evaluation Patient Details Name: Tiffany [name] MRN: [id number] DOB: June 09, 1951 Today's Date: 05/31/2013 Time: 4098-1191 PT Time Calculation (min): 26 min  PT Assessment / Plan / Recommendation History of Present Illness  LTKA on 05/31/13  Clinical Impression  Pt gave her best effort, did stand and take sidesteps. Pt in too much pain to get into recliner. Pt will benfit from PT to address problems listed. Pt plans SNF for rehab.  PT Assessment  Patient needs continued PT services    Follow Up Recommendations  SNF    Does the patient have the potential to tolerate intense rehabilitation      Barriers to Discharge Decreased caregiver support      Equipment Recommendations  None recommended by PT    Recommendations for Other Services     Frequency 7X/week    Precautions / Restrictions Precautions Precautions: Knee Restrictions Weight Bearing Restrictions: No   Pertinent Vitals/Pain 6-9 when stood up, Has had meds. Repositioned leg, ice.      Mobility  Bed Mobility Bed Mobility: Supine to Sit;Sitting - Scoot to [place] of Bed;Sit to Supine Supine to Sit: 4: Min assist;HOB elevated Sitting - Scoot to [place] of Bed: 4: Min assist Sit to Supine: 3: Mod assist;HOB flat Details for Bed Mobility Assistance: support  of LLE off and onto bed. cues for trunk  position. Transfers Transfers: Sit to Stand;Stand to Sit Sit to Stand: 1: +2 Total assist;From bed;With upper extremity assist Sit to Stand: Patient Percentage: 70% Stand to Sit: To bed;3: Mod assist;With upper extremity assist Details for Transfer Assistance: multimoal cues for UE use and LLE position prior to sitting. Ambulation/Gait Ambulation/Gait Assistance: 1: +2 Total assist Ambulation/Gait: Patient Percentage: 70% Ambulation Distance (Feet): 5 Feet Assistive device: Rolling walker Ambulation/Gait Assistance Details: Pt took steps sideways along bed. L knee pain too much to attempt gettign to  recliner. Returned to bed. Gait Pattern: Step-to pattern;Antalgic    Exercises     PT Diagnosis: Difficulty walking;Acute pain  PT Problem List: Decreased strength;Decreased range of motion;Decreased activity tolerance;Decreased mobility;Pain;Decreased knowledge of precautions;Decreased safety awareness;Decreased knowledge of use of DME PT Treatment Interventions: DME instruction;Gait training;Functional mobility training;Therapeutic activities;Therapeutic exercise;Patient/family education     PT Goals(Current goals can be found in the care plan section) Acute Rehab PT Goals Patient Stated Goal: I want to get started with this therapy. PT Goal Formulation: With patient Time For Goal Achievement: 06/07/13 Potential to Achieve Goals: Good  Visit Information  Last PT Received On: 05/31/13 Assistance Needed: +2 History of Present Illness: LTKA on 05/31/13       Prior Functioning  Home Living Family/patient expects to be discharged to:: Skilled nursing facility Prior Function Level of Independence: Independent Communication Communication: No difficulties    Cognition  Cognition Arousal/Alertness: Awake/alert Behavior During Therapy: WFL for tasks assessed/performed Overall Cognitive Status: Within Functional Limits for tasks assessed    Extremity/Trunk Assessment Upper Extremity Assessment Upper Extremity Assessment: Defer to OT evaluation Lower Extremity Assessment Lower Extremity Assessment: LLE deficits/detail LLE Deficits / Details: near SLR performed.   Balance    End of Session PT - End of Session Activity Tolerance: Patient limited by pain Patient left: in bed;with call bell/phone within reach Nurse Communication: Mobility status  GP     Rada Hay 05/31/2013, 3:39 PM 05/31/2013  Blanchard Kelch PT [phone]

## 2013-05-31 NOTE — Interval H&P Note (Signed)
History and Physical Interval Note:  05/31/2013 7:01 AM  Tiffany [name]  has presented today for surgery, with the diagnosis of Left Knee Osteoarthritis  The various methods of treatment have been discussed with the patient and family. After consideration of risks, benefits and other options for treatment, the patient has consented to  Procedure(s): LEFT TOTAL KNEE ARTHROPLASTY (Left) as a surgical intervention .  The patient's history has been reviewed, patient examined, no change in status, stable for surgery.  I have reviewed the patient's chart and labs.  Questions were answered to the patient's satisfaction.     [name]

## 2013-05-31 NOTE — Transfer of Care (Signed)
Immediate Anesthesia Transfer of Care Note  Patient: Tiffany [name]  Procedure(s) Performed: Procedure(s): LEFT TOTAL KNEE ARTHROPLASTY (Left)  Patient Location: PACU  Anesthesia Type:General  Level of Consciousness: awake, alert , oriented and patient cooperative  Airway & Oxygen Therapy: Patient Spontanous Breathing and Patient connected to face mask oxygen  Post-op Assessment: Report given to PACU RN, Post -op Vital signs reviewed and stable and Patient moving all extremities  Post vital signs: Reviewed and stable  Complications: No apparent anesthesia complications

## 2013-05-31 NOTE — Progress Notes (Signed)
Clinical Social Work Department BRIEF PSYCHOSOCIAL ASSESSMENT 05/31/2013  Patient:  Tiffany [name], Tiffany [name]     Account Number:  [id number]     Admit date:  05/31/2013  Clinical Social Worker:  Candie Chroman  Date/Time:  05/31/2013 03:29 PM  Referred by:  Physician  Date Referred:  05/31/2013 Referred for  SNF Placement   Other Referral:   Interview type:   Other interview type:    PSYCHOSOCIAL DATA Living Status:  FAMILY Admitted from facility:   Level of care:   Primary support name:  Joan Mayans Primary support relationship to patient:  CHILD, ADULT Degree of support available:   unclear    CURRENT CONCERNS Current Concerns  Post-Acute Placement   Other Concerns:    SOCIAL WORK ASSESSMENT / PLAN Pt is a 62 yr old female living at home prior to hospitalization. CSW met with pt to assist with d/c planning. ST Rehab will be needed following hospital d/c. Pt would like to have her placement at [facility] . SNF has been contacted and a decision is pending. CSW will continue to follow to assist with d/c planning.   Assessment/plan status:  Psychosocial Support/Ongoing Assessment of Needs Other assessment/ plan:   Information/referral to community resources:   None needed at this time.    PATIENT'S/FAMILY'S RESPONSE TO PLAN OF CARE: Pt is looking forward to having rehab at [facility].    Cori Razor LCSW [phone]

## 2013-05-31 NOTE — Brief Op Note (Signed)
05/31/2013  10:20 AM  PATIENT:  Tiffany [name]  62 y.o. female  PRE-OPERATIVE DIAGNOSIS:  Left Knee advanced Osteoarthritis with severe deformity and osteophyte formation  POST-OPERATIVE DIAGNOSIS:  Left Knee advanced Osteoarthritis with severe deformity and osteophyte formation  PROCEDURE:  Procedure(s): LEFT TOTAL KNEE ARTHROPLASTY (Left)  Depuy size 3 femur, 2.5 tibial tray and 15mm PS insert with 38 patella button  SURGEON:  Surgeon(s) and Role:    * [name], MD - Primary  PHYSICIAN ASSISTANT: Lanney Gins, PA-C  ASSISTANTS: surgical tech  ANESTHESIA:   general  EBL:  Total I/O In: 1000 [I.V.:1000] Out: 275 [Urine:200; Blood:75]  BLOOD ADMINISTERED:none  DRAINS: (1 medium ) Hemovact drain(s) in the left knee with  Suction Open   LOCAL MEDICATIONS USED:  OTHER Exparel/marcaine combination  SPECIMEN:  No Specimen  DISPOSITION OF SPECIMEN:  N/A  COUNTS:  YES  TOURNIQUET:   Total Tourniquet Time Documented: Thigh (Left) - 47 minutes Total: Thigh (Left) - 47 minutes   DICTATION: .Other Dictation: Dictation Number [id number]  PLAN OF CARE: Admit to inpatient   PATIENT DISPOSITION:  PACU - hemodynamically stable.   Delay start of Pharmacological VTE agent (>24hrs) due to surgical blood loss or risk of bleeding: no

## 2013-05-31 NOTE — Anesthesia Preprocedure Evaluation (Addendum)
Anesthesia Evaluation  Patient identified by MRN, date of birth, ID band Patient awake    Reviewed: Allergy & Precautions, H&P , NPO status , Patient's Chart, lab work & pertinent test results  Airway Mallampati: III TM Distance: >3 FB Neck ROM: Full    Dental  (+) Dental Advisory Given and Poor Dentition   Pulmonary Current Smoker,  breath sounds clear to auscultation        Cardiovascular hypertension, Pt. on medications Rhythm:Regular Rate:Normal     Neuro/Psych negative neurological ROS  negative psych ROS   GI/Hepatic negative GI ROS, Neg liver ROS,   Endo/Other  Morbid obesity  Renal/GU negative Renal ROS     Musculoskeletal negative musculoskeletal ROS (+)   Abdominal (+) + obese,   Peds  Hematology negative hematology ROS (+)   Anesthesia Other Findings   Reproductive/Obstetrics negative OB ROS                        Anesthesia Physical Anesthesia Plan  ASA: III  Anesthesia Plan: General   Post-op Pain Management:    Induction: Intravenous  Airway Management Planned: Oral ETT  Additional Equipment:   Intra-op Plan:   Post-operative Plan: Extubation in OR  Informed Consent: I have reviewed the patients History and Physical, chart, labs and discussed the procedure including the risks, benefits and alternatives for the proposed anesthesia with the patient or authorized representative who has indicated his/her understanding and acceptance.   Dental advisory given  Plan Discussed with: CRNA  Anesthesia Plan Comments:        Anesthesia Quick Evaluation

## 2013-05-31 NOTE — Anesthesia Postprocedure Evaluation (Signed)
Anesthesia Post Note  Patient: Tiffany [name]  Procedure(s) Performed: Procedure(s) (LRB): LEFT TOTAL KNEE ARTHROPLASTY (Left)  Anesthesia type: General  Patient location: PACU  Post pain: Pain level controlled  Post assessment: Post-op Vital signs reviewed  Last Vitals: BP 160/95  Pulse 90  Temp(Src) 36.5 C (Oral)  Resp 16  Ht 5\' 2"  (1.575 m)  Wt 236 lb (107.049 kg)  BMI 43.15 kg/m2  SpO2 100%  Post vital signs: Reviewed  Level of consciousness: sedated  Complications: No apparent anesthesia complications

## 2013-05-31 NOTE — Progress Notes (Signed)
Clinical Social Work Department CLINICAL SOCIAL WORK PLACEMENT NOTE 05/31/2013  Patient:  Tiffany [name], Tiffany [name]  Account Number:  [id number] Admit date:  05/31/2013  Clinical Social Worker:  Cori Razor, LCSW  Date/time:  05/31/2013 03:39 PM  Clinical Social Work is seeking post-discharge placement for this patient at the following level of care:   SKILLED NURSING   (*CSW will update this form in Epic as items are completed)     Patient/family provided with [facility] Department of Clinical Social Work's list of facilities offering this level of care within the geographic area requested by the patient (or if unable, by the patient's family).  05/31/2013  Patient/family informed of their freedom to choose among providers that offer the needed level of care, that participate in Medicare, Medicaid or managed care program needed by the patient, have an available bed and are willing to accept the patient.    Patient/family informed of [facility]' ownership interest in [facility], as well as of the fact that they are under no obligation to receive care at this facility.  PASARR submitted to EDS on 05/31/2013 PASARR number received from [employer] on 05/31/2013  FL2 transmitted to all facilities in geographic area requested by pt/family on  05/31/2013 FL2 transmitted to all facilities within larger geographic area on   Patient informed that his/her managed care company has contracts with or will negotiate with  certain facilities, including the following:     Patient/family informed of bed offers received:   Patient chooses bed at  Physician recommends and patient chooses bed at    Patient to be transferred to  on   Patient to be transferred to facility by   The following physician request were entered in Epic:   Additional Comments:  Cori Razor LCSW [phone]

## 2013-05-31 NOTE — Progress Notes (Signed)
Utilization review completed.  

## 2013-05-31 NOTE — Preoperative (Signed)
Beta Blockers   Reason not to administer Beta Blockers:Not Applicable 

## 2013-06-01 ENCOUNTER — Encounter (HOSPITAL_COMMUNITY): Payer: Self-pay | Admitting: Orthopedic Surgery

## 2013-06-01 DIAGNOSIS — D500 Iron deficiency anemia secondary to blood loss (chronic): Secondary | ICD-10-CM | POA: Diagnosis not present

## 2013-06-01 LAB — BASIC METABOLIC PANEL
BUN: 14 mg/dL (ref 6–23)
Calcium: 8.6 mg/dL (ref 8.4–10.5)
Creatinine, Ser: 0.82 mg/dL (ref 0.50–1.10)
GFR calc Af Amer: 87 mL/min — ABNORMAL LOW (ref >90)
GFR calc non Af Amer: 75 mL/min — ABNORMAL LOW (ref >90)
Glucose, Bld: 181 mg/dL — ABNORMAL HIGH (ref 70–99)

## 2013-06-01 LAB — CBC
HCT: 32.7 % — ABNORMAL LOW (ref 36.0–46.0)
Hemoglobin: 10.5 g/dL — ABNORMAL LOW (ref 12.0–15.0)
MCH: 27.1 pg (ref 26.0–34.0)
MCHC: 32.1 g/dL (ref 30.0–36.0)
MCV: 84.5 fL (ref 78.0–100.0)
Platelets: 282 10*3/uL (ref 150–400)
RDW: 14.4 % (ref 11.5–15.5)

## 2013-06-01 NOTE — Progress Notes (Signed)
   Subjective: 1 Day Post-Op Procedure(s) (LRB): LEFT TOTAL KNEE ARTHROPLASTY (Left)   Patient reports pain as mild, pain well controlled. No events throughout the night.   Objective:   VITALS:   Filed Vitals:   06/01/13 1005  BP: 163/91  Pulse: 81  Temp: 98.5 F (36.9 C)  Resp: 16    Neurovascular intact Dorsiflexion/Plantar flexion intact Incision: dressing C/D/I No cellulitis present Compartment soft  LABS  Recent Labs  06/01/13 0403  HGB 10.5*  HCT 32.7*  WBC 12.2*  PLT 282     Recent Labs  06/01/13 0403  NA 137  K 3.5  BUN 14  CREATININE 0.82  GLUCOSE 181*     Assessment/Plan: 1 Day Post-Op Procedure(s) (LRB): LEFT TOTAL KNEE ARTHROPLASTY (Left) Foley cath d/c'ed HV drain d/c'ed Advance diet Up with therapy D/C IV fluids Discharge to SNF eventually, when ready  Expected ABLA  Treated with iron and will observe  Morbid Obesity (BMI >40)  Estimated body mass index is 43.15 kg/(m^2) as calculated from the following:   Height as of this encounter: 5\' 2"  (1.575 m).   Weight as of this encounter: 107.049 kg (236 lb). Patient also counseled that weight may inhibit the healing process Patient counseled that losing weight will help with future health issues        Anastasio Auerbach. Ron Junco   PAC  06/01/2013, 10:19 AM

## 2013-06-01 NOTE — Progress Notes (Signed)
Physical Therapy Treatment Patient Details Name: CORLISS COGGESHALL MRN: [id number] DOB: 05-27-1951 Today's Date: 06/01/2013 Time: 9629-5284 PT Time Calculation (min): 24 min  PT Assessment / Plan / Recommendation  History of Present Illness LTKA on 05/31/13   PT Comments   Pm session amb pt in hallway then assisted to BR.   Follow Up Recommendations  SNF     Does the patient have the potential to tolerate intense rehabilitation     Barriers to Discharge        Equipment Recommendations  None recommended by PT    Recommendations for Other Services    Frequency 7X/week   Progress towards PT Goals Progress towards PT goals: Progressing toward goals  Plan      Precautions / Restrictions Precautions Precautions: Knee Restrictions Weight Bearing Restrictions: No    Pertinent Vitals/Pain C/o 4/10 oain with act    Mobility  Bed Mobility Bed Mobility: Not assessed Details for Bed Mobility Assistance: Pt OOB Transfers Transfers: Sit to Stand;Stand to Sit Sit to Stand: 4: Min assist;From chair/3-in-1 Stand to Sit: 4: Min assist;To chair/3-in-1 Details for Transfer Assistance: multimoal cues for UE use and LLE position prior to sitting. Ambulation/Gait Ambulation/Gait Assistance: 4: Min assist Ambulation Distance (Feet): 55 Feet Assistive device: Rolling walker Ambulation/Gait Assistance Details: increased time and 25% VC's on proper walker to self distance and upright posture. Gait Pattern: Step-to pattern;Antalgic Gait velocity: decreased    PT Goals (current goals can now be found in the care plan section)    Visit Information  Last PT Received On: 06/01/13 Assistance Needed: +1 History of Present Illness: LTKA on 05/31/13    Subjective Data      Cognition       Balance     End of Session PT - End of Session Equipment Utilized During Treatment: Gait belt Activity Tolerance: Patient tolerated treatment well Patient left: in chair;with family/visitor  present;with call bell/phone within reach   Felecia Shelling  PTA [facility]  Acute  Rehab Pager      [phone]

## 2013-06-01 NOTE — Progress Notes (Signed)
OT Cancellation Note  Patient Details Name: Tiffany [name] MRN: [id number] DOB: Jul 22, 1951   Cancelled Treatment:    Reason Eval/Treat Not Completed:  (Noted plan for SNF- will defer OT eval to SNF)  Lion Fernandez, [place] Kung 06/01/2013, 8:31 AM

## 2013-06-01 NOTE — Op Note (Signed)
Tiffany [name], [name]              ACCOUNT NO.:  [id number]  MEDICAL RECORD NO.:  [id number]  LOCATION:  1619                         FACILITY:  [facility]  PHYSICIAN:  Madlyn Frankel. Charlann Boxer, M.D.  DATE OF BIRTH:  Jul 25, 1951  DATE OF PROCEDURE:  05/31/2013 DATE OF DISCHARGE:                              OPERATIVE REPORT   PREOPERATIVE DIAGNOSIS:  Severe left knee osteoarthritis with severe flexion and varus deformity associated with abundant tricompartmental osteophytes.  POSTOPERATIVE DIAGNOSIS:  Severe left knee osteoarthritis with severe flexion and varus deformity associated with abundant tricompartmental osteophytes.  PROCEDURE:  Left total knee replacement utilizing DePuy rotating platform, posterior stabilized knee system with size 3 femur, 2.5 tibia, 15-mm posterior stabilized insert to match the 3 femur and a 38-patellar button.  SURGEON:  Madlyn Frankel. Charlann Boxer, M.D.  ASSISTANT:  Lanney Gins, PA, noting that Mr. Carmon Sails was present in the entirety of the case for preoperative positioning, perioperative management of operative extremity, general facilitation of the case, primary wound closure.  DRAINS:  One medium Hemovac.  TOURNIQUET TIME:  47 minutes at 250 mmHg.  COMPLICATIONS:  None apparent.  INJECTABLES:  I did inject the synovial capsule junction of the knee with Exparel/Marcaine solution.  INDICATIONS FOR PROCEDURE:  Tiffany [name] is a 62 year old female, who was referred for surgical consideration with severe degenerative changes, clinical deformity, as well as advanced osteophytosis noted radiographically.  She had failed conservative measures, had significant reduction of her overall quality of life.  Given her age, she was open to improve her overall quality of life to allow for further functioning capacity.  Risks of infection, DVT, component failure, need for future revision surgery were all discussed.  Consent was obtained for benefit of  pain relief.  PROCEDURE IN DETAIL:  The patient was brought to the operative theater. Once adequate anesthesia and preoperative antibiotics, Ancef administered she was positioned supine with a left thigh tourniquet placed.  The left lower extremity was then prepped and draped in sterile fashion with the Mayo leg holder used.  A time-out was performed identifying the patient, planned procedure, and extremity.  The leg was exsanguinated, tourniquet elevated to 300 mmHg.  The midline incision was made followed by soft tissue exposure and median arthrotomy was then made.  The patient's radiographic findings were confirmed with intraoperative significant osteophytes interfering with exposure. Following proximal medial peel, attention was first directed to the patella, precut measure was noted to be about 23 mm.  I resected down about 14 mm and used a 38 patellar button to restore height.  Lug holes were drilled and a metal Shim placed to protect the patella from retractors and saw blades from here on out.  At this point, after 10 hands exposure I used an osteotome to remove significant osteophytes medially and over the anterior flange of the femur as well as removed the intracondylar osteophytes to better define the anatomy.  I then used a starting drill and entered the femoral canal, irrigated to try to prevent fat emboli.  With 3 degrees of valgus, 10 mm of bone was resected off the distal femur.  At this point, based on the proximal medial peel, I was able to sublux  the tibia anteriorly with the retractors placed.  I used an extramedullary guide and resected 10 mm bone off the proximal lateral tibia.  This amounted to about a 2-mm resection off the deformed medial, proximal tibia.  Following this resection, we confirmed that the cut was perpendicular to coronal plane, as well as noting the extension gap at this point, was still tight with less than 10, I did not want to take any  further bone knowing that as we did further releases that she would most likely loosen up.  Given these findings, now attended to the back to the femur.  The femoral best fit with a size 3 from anterior-posterior dimension.  The size 3 rotation block was then pinned in position utilizing the C-clamp based off perpendicular cut on the proximal tibia.  The 4 in 1 cutting block was then pinned in position.  Anterior, posterior, and chamfer cuts were made without difficulty.  Bone was removed allowing further osteophyte removal.  At this point, I used laminar spreaders, removed posterior osteophytes medially and laterally.  I then used a size 3 box cutter to make a final box cut.  Attention was now redirected back to the tibia and after removing medial osteophytes, the cut surface of the tibia was best fit with size 2.5 tibial tray.  It was pinned in position through the medial third of the tubercle drilled and keel punched.  Trial reduction now carried out with 3 femur, 2.5 tibia.  With this, I felt that the 15-mm insert provided the best stability in extension and flexion.  The patella was noted to track through the trochlea without application pressure.  At this point, the synovial capsule junction of the knee was injected with 20 mL of Exparel and 25 mL of Marcaine and saline and Toradol.  Trial components removed.  The final bone preparation was carried out as necessary.  The final components were opened and cement mixed.  Once cement was ready, the final components were cemented into position.  The knee was brought into extension with a 15 mm trial insert.  Extruded cement was removed once cement fully cured.  Once the cement fully cured, excessive cement was removed throughout the knee from the lateral and posterior aspect of the knee.  We selected a 15 mm insert based on the trial reduction.  Following irrigation, the final 15 mm posterior stabilized insert was placed into the  knee.  The knee was then irrigated again.  A medium Hemovac drain was placed deep.  At this point, the extensor mechanism was reapproximated over a closed medium Hemovac drain with the knee in flexion about 60 degrees.  The remainder of the wound was closed in layers with 2-0 Vicryl and running 4-0 Monocryl.  The knee was cleaned, dried, and dressed sterilely using Dermabond and Aquacel dressing.  Drain site dressed separately.  The patient was then brought into the recovery room in stable condition extubated, tolerating the procedure well.  She will be weightbearing as tolerated.  Discharge plan pending evaluation and management with the physical therapy.     Madlyn Frankel Charlann Boxer, M.D.     MDO/MEDQ  D:  05/31/2013  T:  06/01/2013  Job:  [id number]

## 2013-06-01 NOTE — Progress Notes (Signed)
Physical Therapy Treatment Patient Details Name: Tiffany [name] MRN: [id number] DOB: 19-Oct-1950 Today's Date: 06/01/2013 Time: 7829-5621 PT Time Calculation (min): 24 min  PT Assessment / Plan / Recommendation  History of Present Illness LTKA on 05/31/13   PT Comments   POD # 1 am session.  Pt OOB in recliner.  Amb in hallway then performed TKR TE's followed by ICE.  Pt plans to D/C to SNF for ST Rehab.    Follow Up Recommendations  SNF     Does the patient have the potential to tolerate intense rehabilitation     Barriers to Discharge        Equipment Recommendations       Recommendations for Other Services    Frequency 7X/week   Progress towards PT Goals Progress towards PT goals: Progressing toward goals  Plan      Precautions / Restrictions Precautions Precautions: Knee Restrictions Weight Bearing Restrictions: No    Pertinent Vitals/Pain C/o 7/10 during TE's ICE applied    Mobility  Bed Mobility Bed Mobility: Not assessed Details for Bed Mobility Assistance: Pt OOB in recliner Transfers Transfers: Sit to Stand;Stand to Sit Sit to Stand: 4: Min assist;From chair/3-in-1 Stand to Sit: 4: Min assist;To chair/3-in-1 Details for Transfer Assistance: multimoal cues for UE use and LLE position prior to sitting. Ambulation/Gait Ambulation/Gait Assistance: 4: Min assist Ambulation Distance (Feet): 46 Feet Assistive device: Rolling walker Ambulation/Gait Assistance Details: increased time and 25% VC's on safety with turns and to increase heel strike L LE.  Gait Pattern: Step-to pattern;Antalgic Gait velocity: decreased     PT Goals (current goals can now be found in the care plan section)    Visit Information  Last PT Received On: 06/01/13 Assistance Needed: +1 History of Present Illness: LTKA on 05/31/13    Subjective Data      Cognition       Balance     End of Session PT - End of Session Equipment Utilized During Treatment: Gait belt Activity  Tolerance: Patient tolerated treatment well Patient left: in chair;with call bell/phone within reach;with family/visitor present   Felecia Shelling  PTA [facility]  Acute  Rehab Pager      [phone]

## 2013-06-02 LAB — CBC
MCH: 27.1 pg (ref 26.0–34.0)
MCHC: 32.2 g/dL (ref 30.0–36.0)
RBC: 3.95 MIL/uL (ref 3.87–5.11)
RDW: 14.4 % (ref 11.5–15.5)

## 2013-06-02 LAB — BASIC METABOLIC PANEL
Calcium: 8.9 mg/dL (ref 8.4–10.5)
Chloride: 102 mEq/L (ref 96–112)
Creatinine, Ser: 0.83 mg/dL (ref 0.50–1.10)
GFR calc Af Amer: 86 mL/min — ABNORMAL LOW (ref >90)
GFR calc non Af Amer: 74 mL/min — ABNORMAL LOW (ref >90)
Glucose, Bld: 128 mg/dL — ABNORMAL HIGH (ref 70–99)
Potassium: 4.1 mEq/L (ref 3.5–5.1)
Sodium: 137 mEq/L (ref 135–145)

## 2013-06-02 NOTE — Progress Notes (Signed)
Physical Therapy Treatment Patient Details Name: Tiffany [name] MRN: [id number] DOB: 11/25/1950 Today's Date: 06/02/2013 Time: 4098-1191 PT Time Calculation (min): 11 min  PT Assessment / Plan / Recommendation  History of Present Illness LTKA on 05/31/13   PT Comments   POD # 2 pm session.  Assisted pt OOB to amb in hallway then assisted back to bed.  Follow Up Recommendations  SNF     Does the patient have the potential to tolerate intense rehabilitation     Barriers to Discharge        Equipment Recommendations  None recommended by PT    Recommendations for Other Services    Frequency 7X/week   Progress towards PT Goals Progress towards PT goals: Progressing toward goals  Plan      Precautions / Restrictions Precautions Precautions: Knee Restrictions Weight Bearing Restrictions: No    Pertinent Vitals/Pain C/o 3/10 pain    Mobility  Bed Mobility Bed Mobility: Supine to Sit;Sit to Supine Supine to Sit: 4: Min assist;4: Min guard Sitting - Scoot to [place] of Bed: 4: Min assist Sit to Supine: 4: Min guard;4: Min assist Details for Bed Mobility Assistance: increased time Transfers Transfers: Sit to Stand;Stand to Sit Sit to Stand: 4: Min guard;4: Min assist;From bed Stand to Sit: 4: Min guard;4: Min assist;To bed Details for Transfer Assistance: multimoal cues for UE use and LLE position prior to sitting. Ambulation/Gait Ambulation/Gait Assistance: 4: Min assist;4: Min Government social research officer (Feet): 65 Feet Assistive device: Rolling walker Ambulation/Gait Assistance Details: increased time and one VC on safety with turns Gait Pattern: Step-to pattern;Antalgic Gait velocity: decreased     PT Goals (current goals can now be found in the care plan section)    Visit Information  Last PT Received On: 06/02/13 Assistance Needed: +1 History of Present Illness: LTKA on 05/31/13    Subjective Data      Cognition       Balance     End of Session PT -  End of Session Equipment Utilized During Treatment: Gait belt Activity Tolerance: Patient tolerated treatment well Patient left: in bed;with bed alarm set;with family/visitor present Nurse Communication: Mobility status   Felecia Shelling  PTA [facility]  Acute  Rehab Pager      [phone]

## 2013-06-02 NOTE — Progress Notes (Signed)
Patient ID: Tiffany [name], female   DOB: 12/15/1950, 62 y.o.   MRN: [id number] Subjective: 2 Days Post-Op Procedure(s) (LRB): LEFT TOTAL KNEE ARTHROPLASTY (Left)    Patient reports pain as mild to moderate but tolerable.  At this point happy with her progress and asking when other knee could be done.  Plans to go to SNF  Objective:   VITALS:   Filed Vitals:   06/02/13 0852  BP: 164/85  Pulse: 65  Temp: 98.3 F (36.8 C)  Resp: 16    Neurovascular intact Incision: dressing C/D/I ACE wrap removed, drain dressing removed  LABS  Recent Labs  06/01/13 0403 06/02/13 0428  HGB 10.5* 10.7*  HCT 32.7* 33.2*  WBC 12.2* 13.6*  PLT 282 297     Recent Labs  06/01/13 0403 06/02/13 0428  NA 137 137  K 3.5 4.1  BUN 14 16  CREATININE 0.82 0.83  GLUCOSE 181* 128*    No results found for this basename: LABPT, INR,  in the last 72 hours   Assessment/Plan: 2 Days Post-Op Procedure(s) (LRB): LEFT TOTAL KNEE ARTHROPLASTY (Left)   Up with therapy Discharge to SNF tomorrow, [facility]

## 2013-06-02 NOTE — Progress Notes (Signed)
Physical Therapy Treatment Patient Details Name: Tiffany [name] MRN: [id number] DOB: 29-Nov-1950 Today's Date: 06/02/2013 Time: 4098-1191 PT Time Calculation (min): 26 min  PT Assessment / Plan / Recommendation  History of Present Illness LTKA on 05/31/13   PT Comments   POD # 2 am session.  Assisted pt OOB to amb in hallway then performed TE's followed by ICE.  Pt plans to D/C to SNF tomorrow.   Follow Up Recommendations  SNF     Does the patient have the potential to tolerate intense rehabilitation     Barriers to Discharge        Equipment Recommendations  None recommended by PT    Recommendations for Other Services    Frequency 7X/week   Progress towards PT Goals Progress towards PT goals: Progressing toward goals  Plan      Precautions / Restrictions Precautions Precautions: Knee Restrictions Weight Bearing Restrictions: No    Pertinent Vitals/Pain C/o 5/10  ICE applied    Mobility  Bed Mobility Bed Mobility: Supine to Sit Supine to Sit: 4: Min assist;4: Min guard Details for Bed Mobility Assistance: increased time Transfers Transfers: Sit to Stand;Stand to Sit Sit to Stand: 4: Min guard;4: Min assist;From chair/3-in-1 Stand to Sit: 4: Min guard;4: Min assist;To chair/3-in-1 Details for Transfer Assistance: multimoal cues for UE use and LLE position prior to sitting. Ambulation/Gait Ambulation/Gait Assistance: 4: Min assist;4: Min Government social research officer (Feet): 80 Feet Assistive device: Rolling walker Ambulation/Gait Assistance Details: increasedd time and <25% VC's on safety with turns Gait Pattern: Step-to pattern;Antalgic Gait velocity: decreased    Exercises   Total Knee Replacement TE's 10 reps B LE ankle pumps 10 reps knee presses 10 reps heel slides  10 reps SAQ's 10 reps SLR's 10 reps ABD Followed by ICE    PT Goals (current goals can now be found in the care plan section)    Visit Information  Last PT Received On:  06/02/13 History of Present Illness: LTKA on 05/31/13    Subjective Data      Cognition       Balance     End of Session PT - End of Session Equipment Utilized During Treatment: Gait belt Activity Tolerance: Patient tolerated treatment well Patient left: in chair;with family/visitor present;with call bell/phone within reach   Felecia Shelling  PTA [facility]  Acute  Rehab Pager      [phone]

## 2013-06-03 ENCOUNTER — Encounter: Payer: Self-pay | Admitting: Internal Medicine

## 2013-06-03 ENCOUNTER — Non-Acute Institutional Stay (SKILLED_NURSING_FACILITY): Payer: Medicare Other | Admitting: Internal Medicine

## 2013-06-03 DIAGNOSIS — Z96652 Presence of left artificial knee joint: Secondary | ICD-10-CM

## 2013-06-03 DIAGNOSIS — M159 Polyosteoarthritis, unspecified: Secondary | ICD-10-CM | POA: Diagnosis not present

## 2013-06-03 DIAGNOSIS — Z5189 Encounter for other specified aftercare: Secondary | ICD-10-CM | POA: Diagnosis not present

## 2013-06-03 DIAGNOSIS — Z96659 Presence of unspecified artificial knee joint: Secondary | ICD-10-CM | POA: Diagnosis not present

## 2013-06-03 DIAGNOSIS — J209 Acute bronchitis, unspecified: Secondary | ICD-10-CM | POA: Diagnosis not present

## 2013-06-03 DIAGNOSIS — IMO0001 Reserved for inherently not codable concepts without codable children: Secondary | ICD-10-CM | POA: Diagnosis not present

## 2013-06-03 DIAGNOSIS — M1990 Unspecified osteoarthritis, unspecified site: Secondary | ICD-10-CM | POA: Diagnosis not present

## 2013-06-03 DIAGNOSIS — I10 Essential (primary) hypertension: Secondary | ICD-10-CM | POA: Diagnosis not present

## 2013-06-03 DIAGNOSIS — D500 Iron deficiency anemia secondary to blood loss (chronic): Secondary | ICD-10-CM

## 2013-06-03 DIAGNOSIS — S8990XA Unspecified injury of unspecified lower leg, initial encounter: Secondary | ICD-10-CM | POA: Diagnosis not present

## 2013-06-03 DIAGNOSIS — B351 Tinea unguium: Secondary | ICD-10-CM | POA: Diagnosis not present

## 2013-06-03 DIAGNOSIS — M25569 Pain in unspecified knee: Secondary | ICD-10-CM | POA: Diagnosis not present

## 2013-06-03 DIAGNOSIS — Z5333 Arthroscopic surgical procedure converted to open procedure: Secondary | ICD-10-CM | POA: Diagnosis not present

## 2013-06-03 MED ORDER — POLYETHYLENE GLYCOL 3350 17 G PO PACK: 17.0000 g | PACK | Freq: Two times a day (BID) | ORAL | Status: DC

## 2013-06-03 MED ORDER — METHOCARBAMOL 500 MG PO TABS: 500.0000 mg | ORAL_TABLET | Freq: Four times a day (QID) | ORAL | Status: DC | PRN

## 2013-06-03 MED ORDER — ASPIRIN 325 MG PO TBEC: 325.0000 mg | DELAYED_RELEASE_TABLET | Freq: Two times a day (BID) | ORAL | Status: AC

## 2013-06-03 MED ORDER — FERROUS SULFATE 325 (65 FE) MG PO TABS: 325.0000 mg | ORAL_TABLET | Freq: Three times a day (TID) | ORAL | Status: DC

## 2013-06-03 MED ORDER — DSS 100 MG PO CAPS: 100.0000 mg | ORAL_CAPSULE | Freq: Two times a day (BID) | ORAL | Status: DC

## 2013-06-03 MED ORDER — HYDROCODONE-ACETAMINOPHEN 7.5-325 MG PO TABS: 1.0000 | ORAL_TABLET | ORAL | Status: DC | PRN

## 2013-06-03 NOTE — Assessment & Plan Note (Signed)
Admitted for OT/PT; on ASA 325 BID as DVT prophylaxis

## 2013-06-03 NOTE — Progress Notes (Signed)
Patient ID: Tiffany [name], female   DOB: 1950-12-04, 62 y.o.   MRN: [id number] Subjective: 3 Days Post-Op Procedure(s) (LRB): LEFT TOTAL KNEE ARTHROPLASTY (Left)    Patient reports pain as mild. Having some muscle spasms this am - nursing helping to control.  Otherwise has done very well  Objective:   VITALS:   Filed Vitals:   06/03/13 0621  BP: 107/69  Pulse: 69  Temp: 97.8 F (36.6 C)  Resp: 16    Neurovascular intact Incision: dressing C/D/I  LABS  Recent Labs  06/01/13 0403 06/02/13 0428  HGB 10.5* 10.7*  HCT 32.7* 33.2*  WBC 12.2* 13.6*  PLT 282 297     Recent Labs  06/01/13 0403 06/02/13 0428  NA 137 137  K 3.5 4.1  BUN 14 16  CREATININE 0.82 0.83  GLUCOSE 181* 128*    No results found for this basename: LABPT, INR,  in the last 72 hours   Assessment/Plan: 3 Days Post-Op Procedure(s) (LRB): LEFT TOTAL KNEE ARTHROPLASTY (Left)   Up with therapy  Plan to be discharged to SNF today  RTC 2 weeks  Will need her right knee replaced in near future

## 2013-06-03 NOTE — Progress Notes (Signed)
MRN: [id number] Name: Tiffany [name]  Sex: female Age: 62 y.o. DOB: [birth year]  PSC #: Ronni Rumble Facility/Room: 118 Level Of Care: SNF Provider: Merrilee Seashore D Emergency Contacts: Extended Emergency Contact Information Primary Emergency Contact: [facility] Address: [address]  [address]           [place], [place] [address] [place] Home Phone: [phone] Mobile Phone: [phone] Relation: Son Secondary Emergency Contact: [facility] Address: [address]  [address]           [place], [place] [address] [place] Home Phone: [phone] Mobile Phone: [phone] Relation: Daughter  Code Status: FULL  Allergies: Review of patient's allergies indicates no known allergies.  Chief Complaint  Patient presents with  . nursing home admission    HPI: Patient is 63 y.o. female who just had a L total knee replacement and is admitted for OT/PT.   Past Medical History  Diagnosis Date  . Hypertension   . Arthritis     osteoarthritis  . Hx of bronchitis     Past Surgical History  Procedure Laterality Date  . Knee surgery      arthroscopic right  . Toe nail removal    . Total knee arthroplasty Left 05/31/2013    Procedure: LEFT TOTAL KNEE ARTHROPLASTY;  Surgeon: [name], MD;  Location: [facility];  Service: Orthopedics;  Laterality: Left;      Medication List       This list is accurate as of: 06/03/13  8:27 PM.  Always use your most recent med list.               amLODipine 5 MG tablet  Commonly known as:  NORVASC  Take 5 mg by mouth daily before breakfast.     aspirin 325 MG EC tablet  Take 1 tablet (325 mg total) by mouth 2 (two) times daily.     DSS 100 MG Caps  Take 100 mg by mouth 2 (two) times daily.     ferrous sulfate 325 (65 FE) MG tablet  Take 1 tablet (325 mg total) by mouth 3 (three) times daily after meals.     HYDROcodone-acetaminophen 7.5-325 MG per tablet  Commonly known as:  NORCO  Take 1-2 tablets by  mouth every 4 (four) hours as needed for pain.     methocarbamol 500 MG tablet  Commonly known as:  ROBAXIN  Take 1 tablet (500 mg total) by mouth every 6 (six) hours as needed (muscle spasms).     polyethylene glycol packet  Commonly known as:  MIRALAX / GLYCOLAX  Take 17 g by mouth 2 (two) times daily.        No orders of the defined types were placed in this encounter.    Immunization History  Administered Date(s) Administered  . Influenza,inj,Quad PF,36+ Mos 06/01/2013    History  Substance Use Topics  . Smoking status: Current Every Day Smoker -- 0.25 packs/day for 16 years    Types: Cigarettes  . Smokeless tobacco: Not on file  . Alcohol Use: No    Family history is noncontributory    Review of Systems  DATA OBTAINED: from patient, nurse, medical record, family member GENERAL: Feels well no fevers, fatigue, appetite changes SKIN: No itching, rash or wounds EYES: No eye pain, redness, discharge EARS: No earache, tinnitus, change in hearing NOSE: No congestion, drainage or bleeding  MOUTH/THROAT: No mouth or tooth pain, No sore throat, No difficulty chewing or swallowing  RESPIRATORY:  No cough, wheezing, SOB CARDIAC: No chest pain, palpitations, lower extremity edema  GI: No abdominal pain, No N/V/D or constipation, No heartburn or reflux  GU: No dysuria, frequency or urgency, or incontinence  MUSCULOSKELETAL: a liitle bit of knee pain, would like some pain med NEUROLOGIC: No headache, dizziness or focal weakness PSYCHIATRIC: No overt anxiety or sadness. Sleeps well. No behavior issue.   Filed Vitals:   06/03/13 2025  BP: 145/95  Pulse: 69  Temp: 97.2 F (36.2 C)  Resp: 20    Physical Exam  GENERAL APPEARANCE: Alert, conversant. Appropriately groomed. No acute distress.  SKIN: No diaphoresis rash, or wounds HEAD: Normocephalic, atraumatic  EYES: Conjunctiva/lids clear. Pupils round, reactive. EOMs intact.  EARS: External exam WNL, canals clear.  Hearing grossly normal.  NOSE: No deformity or discharge.  MOUTH/THROAT: Lips w/o lesions.  RESPIRATORY: Breathing is even, unlabored. Lung sounds are clear   CARDIOVASCULAR: Heart RRR no murmurs, rubs or gallops. trace peripheral edema.  ARTERIAL: radial pulse 2+, DP pulse 1+  VENOUS: No varicosities. No venous stasis skin changes  GASTROINTESTINAL: Abdomen is soft, non-tender, not distended w/ normal bowel sounds GENITOURINARY: Bladder non tender, not distended  MUSCULOSKELETAL: L knee with swelling and dressing NEUROLOGIC: Oriented X3. Cranial nerves 2-12 grossly intact. Moves all extremities no tremor. PSYCHIATRIC: Mood and affect appropriate to situation, no behavioral issues  Patient Active Problem List   Diagnosis Date Noted  . Expected blood loss anemia 06/01/2013  . Morbid obesity 06/01/2013  . 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 Preop cardiovascular exam 02/18/2013    CBC    Component Value Date/Time   WBC 13.6* 06/02/2013 0428   RBC 3.95 06/02/2013 0428   HGB 10.7* 06/02/2013 0428   HCT 33.2* 06/02/2013 0428   PLT 297 06/02/2013 0428   MCV 84.1 06/02/2013 0428   LYMPHSABS 1.5 11/23/2012 0440   MONOABS 0.5 11/23/2012 0440   EOSABS 0.1 11/23/2012 0440   BASOSABS 0.0 11/23/2012 0440    CMP     Component Value Date/Time   NA 137 06/02/2013 0428   K 4.1 06/02/2013 0428   CL 102 06/02/2013 0428   CO2 26 06/02/2013 0428   GLUCOSE 128* 06/02/2013 0428   BUN 16 06/02/2013 0428   CREATININE 0.83 06/02/2013 0428   CALCIUM 8.9 06/02/2013 0428   PROT 7.6 01/14/2011 0820   ALBUMIN 3.5 01/14/2011 0820   AST 20 01/14/2011 0820   ALT 19 01/14/2011 0820   ALKPHOS 129* 01/14/2011 0820   BILITOT 0.2* 01/14/2011 0820   GFRNONAA 74* 06/02/2013 0428   GFRAA 86* 06/02/2013 0428    Assessment and Plan  S/P left TKA Admitted for OT/PT; on ASA 325 BID as DVT prophylaxis  Expected blood loss anemia pt is on ferrous sulfate;will recheck CBC in 1 week 10/16    Margit Hanks, MD

## 2013-06-03 NOTE — Assessment & Plan Note (Signed)
pt is on ferrous sulfate;will recheck CBC in 1 week 10/16

## 2013-06-03 NOTE — Discharge Summary (Signed)
Physician Discharge Summary  Patient ID: Tiffany [name] MRN: [id number] DOB/AGE: [birth year] 62 y.o.  Admit date: 05/31/2013 Discharge date:  06/03/2013  Procedures:  Procedure(s) (LRB): LEFT TOTAL KNEE ARTHROPLASTY (Left)  Attending Physician:  Dr. Durene Romans   Admission Diagnoses:   Left knee OA / pain.  Discharge Diagnoses:  Principal Problem:   S/P left TKA Active Problems:   Expected blood loss anemia   Morbid obesity  Past Medical History  Diagnosis Date  . Hypertension   . Arthritis     osteoarthritis  . Hx of bronchitis     HPI: Denyce A [name], 62 y.o. female, has a history of pain and functional disability in the left knee due to arthritis and has failed non-surgical conservative treatments for greater than 12 weeks to includeNSAID's and/or analgesics, corticosteriod injections, use of assistive devices and activity modification. Onset of symptoms was gradual, starting >10 years ago with gradually worsening course since that time. The patient noted no past surgery on the left knee(s). Patient currently rates pain in the left knee(s) at 10 out of 10 with activity. Patient has night pain, worsening of pain with activity and weight bearing, pain that interferes with activities of daily living, pain with passive range of motion, crepitus and joint swelling. Patient has evidence of periarticular osteophytes and joint space narrowing by imaging studies. There is no active infection. Risks, benefits and expectations were discussed with the patient. Patient understand the risks, benefits and expectations and wishes to proceed with surgery.   PCP: Alva Garnet., MD   Discharged Condition: good  Hospital Course:  Patient underwent the above stated procedure on 05/31/2013. Patient tolerated the procedure well and brought to the recovery room in good condition and subsequently to the floor.  POD #1 BP: 163/91 ; Pulse: 81 ; Temp: 98.5 F (36.9 C) ; Resp: 16 Pt's  foley was removed, as well as the hemovac drain removed. IV was changed to a saline lock. Patient reports pain as mild, pain well controlled. No events throughout the night.  Neurovascular intact, dorsiflexion/plantar flexion intact, incision: dressing C/D/I, no cellulitis present and compartment soft.   LABS  Basename    HGB  10.5  HCT  32.7   POD #2  BP: 164/85 ; Pulse: 65 ; Temp: 98.3 F (36.8 C) ; Resp: 16 Patient reports pain as mild to moderate but tolerable. At this point happy with her progress and asking when other knee could be done. Plans to go to SNF. Neurovascular intact, dorsiflexion/plantar flexion intact, incision: dressing C/D/I, no cellulitis present and compartment soft.   LABS  Basename    HGB  10.7  HCT  33.2   POD #3  BP: 107/69 ; Pulse: 69 ; Temp: 97.8 F (36.6 C) ; Resp: 16  Patient reports pain as mild. Having some muscle spasms this am - nursing helping to control. Otherwise has done very well. Neurovascular intact, dorsiflexion/plantar flexion intact, incision: dressing C/D/I, no cellulitis present and compartment soft.   LABS   No new labs   Discharge Exam: General appearance: alert, cooperative and no distress Extremities: Homans sign is negative, no sign of DVT, no edema, redness or tenderness in the calves or thighs and no ulcers, gangrene or trophic changes  Disposition:     Skilled Nursing Facility with follow up in 2 weeks   Follow-up Information   Follow up with [name], MD. Schedule an appointment as soon as possible for a visit in 2 weeks.  Specialty:  Orthopedic Surgery   Contact information:   [address] [place] [address] [phone]       Discharge Orders   Future Orders Complete By Expires   Call MD / Call 911  As directed    Comments:     If you experience chest pain or shortness of breath, CALL 911 and be transported to the hospital emergency room.  If you develope a fever above 101 F, pus  (white drainage) or increased drainage or redness at the wound, or calf pain, call your surgeon's office.   Change dressing  As directed    Comments:     Maintain surgical dressing for 10-14 days, then change the dressing daily with sterile 4 x 4 inch gauze dressing and tape. Keep the area dry and clean.   Constipation Prevention  As directed    Comments:     Drink plenty of fluids.  Prune juice may be helpful.  You may use a stool softener, such as Colace (over the counter) 100 mg twice a day.  Use MiraLax (over the counter) for constipation as needed.   Diet - low sodium heart healthy  As directed    Discharge instructions  As directed    Comments:     Maintain surgical dressing for 10-14 days, then replace with gauze and tape. Keep the area dry and clean until follow up. Follow up in 2 weeks at [facility] [place]. Call with any questions or concerns.   Increase activity slowly as tolerated  As directed    TED hose  As directed    Comments:     Use stockings (TED hose) for 2 weeks on both leg(s).  You may remove them at night for sleeping.   Weight bearing as tolerated  As directed    Questions:     Laterality:     Extremity:          Medication List    STOP taking these medications       ibuprofen 200 MG tablet  Commonly known as:  ADVIL,MOTRIN      TAKE these medications       amLODipine 5 MG tablet  Commonly known as:  NORVASC  Take 5 mg by mouth daily before breakfast.     aspirin 325 MG EC tablet  Take 1 tablet (325 mg total) by mouth 2 (two) times daily.     DSS 100 MG Caps  Take 100 mg by mouth 2 (two) times daily.     ferrous sulfate 325 (65 FE) MG tablet  Take 1 tablet (325 mg total) by mouth 3 (three) times daily after meals.     HYDROcodone-acetaminophen 7.5-325 MG per tablet  Commonly known as:  NORCO  Take 1-2 tablets by mouth every 4 (four) hours as needed for pain.     methocarbamol 500 MG tablet  Commonly known as:  ROBAXIN  Take 1 tablet  (500 mg total) by mouth every 6 (six) hours as needed (muscle spasms).     polyethylene glycol packet  Commonly known as:  MIRALAX / GLYCOLAX  Take 17 g by mouth 2 (two) times daily.         Signed: Anastasio Auerbach. Chucky Homes   PAC  06/03/2013, 7:55 AM

## 2013-06-05 NOTE — Progress Notes (Signed)
Clinical Social Work Department CLINICAL SOCIAL WORK PLACEMENT NOTE 06/05/2013  Patient:  Tiffany [name], Tiffany [name]  Account Number:  [id number] Admit date:  05/31/2013  Clinical Social Worker:  Cori Razor, LCSW  Date/time:  05/31/2013 03:39 PM  Clinical Social Work is seeking post-discharge placement for this patient at the following level of care:   SKILLED NURSING   (*CSW will update this form in Epic as items are completed)     Patient/family provided with [facility] Department of Clinical Social Work's list of facilities offering this level of care within the geographic area requested by the patient (or if unable, by the patient's family).  05/31/2013  Patient/family informed of their freedom to choose among providers that offer the needed level of care, that participate in Medicare, Medicaid or managed care program needed by the patient, have an available bed and are willing to accept the patient.    Patient/family informed of [facility]' ownership interest in [facility], as well as of the fact that they are under no obligation to receive care at this facility.  PASARR submitted to EDS on 05/31/2013 PASARR number received from EDS on 05/31/2013  FL2 transmitted to all facilities in geographic area requested by pt/family on  05/31/2013 FL2 transmitted to all facilities within larger geographic area on   Patient informed that his/her managed care company has contracts with or will negotiate with  certain facilities, including the following:     Patient/family informed of bed offers received:  06/01/2013 Patient chooses bed at [facility] [place] Physician recommends and patient chooses bed at    Patient to be transferred to [facility] [place] on  06/03/2013 Patient to be transferred to facility by [facility]  The following physician request were entered in Epic:   Additional Comments:  Cori Razor LCSW [phone]

## 2013-06-23 ENCOUNTER — Non-Acute Institutional Stay (SKILLED_NURSING_FACILITY): Payer: Medicare Other | Admitting: Internal Medicine

## 2013-06-23 ENCOUNTER — Encounter: Payer: Self-pay | Admitting: Internal Medicine

## 2013-06-23 DIAGNOSIS — Z96659 Presence of unspecified artificial knee joint: Secondary | ICD-10-CM | POA: Diagnosis not present

## 2013-06-23 DIAGNOSIS — D500 Iron deficiency anemia secondary to blood loss (chronic): Secondary | ICD-10-CM | POA: Diagnosis not present

## 2013-06-23 DIAGNOSIS — I10 Essential (primary) hypertension: Secondary | ICD-10-CM | POA: Diagnosis not present

## 2013-06-23 DIAGNOSIS — Z96652 Presence of left artificial knee joint: Secondary | ICD-10-CM

## 2013-06-23 NOTE — Progress Notes (Signed)
MRN: [id number] Name: Tiffany [name]  Sex: female Age: 62 y.o. DOB: [birth year]  PSC #: Ronni Rumble Facility/Room: 118 Level Of Care: SNF Provider: Merrilee Seashore D Emergency Contacts: Extended Emergency Contact Information Primary Emergency Contact: [facility] Address: [address]  [address]           [place], [place] [address] [place] Home Phone: [phone] Mobile Phone: [phone] Relation: Son Secondary Emergency Contact: [facility] Address: [address]  [address]           [place], [place] [address] [place] Home Phone: [phone] Mobile Phone: [phone] Relation: Daughter  Code Status: FULL  Allergies: Review of patient's allergies indicates no known allergies.  Chief Complaint  Patient presents with  . Discharge Note    HPI: Patient is 62 y.o. female who is being discharged to home after completing therapy for her total knee arthroscopy.  Past Medical History  Diagnosis Date  . Hypertension   . Arthritis     osteoarthritis  . Hx of bronchitis     Past Surgical History  Procedure Laterality Date  . Knee surgery      arthroscopic right  . Toe nail removal    . Total knee arthroplasty Left 05/31/2013    Procedure: LEFT TOTAL KNEE ARTHROPLASTY;  Surgeon: [name], MD;  Location: [facility];  Service: Orthopedics;  Laterality: Left;      Medication List       This list is accurate as of: 06/23/13 12:10 PM.  Always use your most recent med list.               amLODipine 5 MG tablet  Commonly known as:  NORVASC  Take 5 mg by mouth daily before breakfast.     aspirin 325 MG EC tablet  Take 1 tablet (325 mg total) by mouth 2 (two) times daily.     DSS 100 MG Caps  Take 100 mg by mouth 2 (two) times daily.     ferrous sulfate 325 (65 FE) MG tablet  Take 1 tablet (325 mg total) by mouth 3 (three) times daily after meals.     HYDROcodone-acetaminophen 5-325 MG per tablet  Commonly known as:  NORCO/VICODIN   Take 1 tablet by mouth every 4 (four) hours as needed for pain. 1 -2 tablets     methocarbamol 500 MG tablet  Commonly known as:  ROBAXIN  Take 1 tablet (500 mg total) by mouth every 6 (six) hours as needed (muscle spasms).     multivitamin tablet  Take 1 tablet by mouth daily.     polyethylene glycol packet  Commonly known as:  MIRALAX / GLYCOLAX  Take 17 g by mouth 2 (two) times daily.        Meds ordered this encounter  Medications  . HYDROcodone-acetaminophen (NORCO/VICODIN) 5-325 MG per tablet    Sig: Take 1 tablet by mouth every 4 (four) hours as needed for pain. 1 -2 tablets  . Multiple Vitamin (MULTIVITAMIN) tablet    Sig: Take 1 tablet by mouth daily.    Immunization History  Administered Date(s) Administered  . Influenza,inj,Quad PF,36+ Mos 06/01/2013    History  Substance Use Topics  . Smoking status: Current Every Day Smoker -- 0.25 packs/day for 16 years    Types: Cigarettes  . Smokeless tobacco: Not on file  . Alcohol Use: No    Filed Vitals:   06/23/13 1203  BP: 116/60  Pulse: 89  Temp: 97 F (36.1  C)  Resp: 20    Physical Exam  GENERAL APPEARANCE: Alert, conversant. Appropriately groomed. No acute distress.  HEENT: Unremarkable. RESPIRATORY: Breathing is even, unlabored. Lung sounds are clear   CARDIOVASCULAR: Heart RRR no murmurs, rubs or gallops. Trace on L peripheral edema, wearing TED hose  GASTROINTESTINAL: Abdomen is soft, non-tender, not distended w/ normal bowel sounds.  NEUROLOGIC: Cranial nerves 2-12 grossly intact. Moves all extremities no tremor.  Patient Active Problem List   Diagnosis Date Noted  . Expected blood loss anemia 06/01/2013  . Morbid obesity 06/01/2013  . 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 Preop cardiovascular exam 02/18/2013    CBC    Component Value Date/Time   WBC 13.6* 06/02/2013 0428   RBC 3.95 06/02/2013 0428   HGB 10.7* 06/02/2013 0428   HCT 33.2* 06/02/2013 0428   PLT 297 06/02/2013 0428   MCV 84.1 06/02/2013  0428   LYMPHSABS 1.5 11/23/2012 0440   MONOABS 0.5 11/23/2012 0440   EOSABS 0.1 11/23/2012 0440   BASOSABS 0.0 11/23/2012 0440    CMP     Component Value Date/Time   NA 137 06/02/2013 0428   K 4.1 06/02/2013 0428   CL 102 06/02/2013 0428   CO2 26 06/02/2013 0428   GLUCOSE 128* 06/02/2013 0428   BUN 16 06/02/2013 0428   CREATININE 0.83 06/02/2013 0428   CALCIUM 8.9 06/02/2013 0428   PROT 7.6 01/14/2011 0820   ALBUMIN 3.5 01/14/2011 0820   AST 20 01/14/2011 0820   ALT 19 01/14/2011 0820   ALKPHOS 129* 01/14/2011 0820   BILITOT 0.2* 01/14/2011 0820   GFRNONAA 74* 06/02/2013 0428   GFRAA 86* 06/02/2013 0428    Assessment and Plan  PT IS BEING DISCHARGED TO HOME IN IMPROVED AND STABLE CONDITION AFTER COMPLETING THERAPY FOR HER TOTAL KNEE ARTHROSCOPY.  Margit Hanks, MD

## 2013-06-28 DIAGNOSIS — F17200 Nicotine dependence, unspecified, uncomplicated: Secondary | ICD-10-CM | POA: Diagnosis not present

## 2013-06-28 DIAGNOSIS — M159 Polyosteoarthritis, unspecified: Secondary | ICD-10-CM | POA: Diagnosis not present

## 2013-06-28 DIAGNOSIS — I10 Essential (primary) hypertension: Secondary | ICD-10-CM | POA: Diagnosis not present

## 2013-06-28 DIAGNOSIS — R269 Unspecified abnormalities of gait and mobility: Secondary | ICD-10-CM | POA: Diagnosis not present

## 2013-06-28 DIAGNOSIS — Z5189 Encounter for other specified aftercare: Secondary | ICD-10-CM | POA: Diagnosis not present

## 2013-06-28 DIAGNOSIS — Z471 Aftercare following joint replacement surgery: Secondary | ICD-10-CM | POA: Diagnosis not present

## 2013-06-28 DIAGNOSIS — Z96659 Presence of unspecified artificial knee joint: Secondary | ICD-10-CM | POA: Diagnosis not present

## 2013-06-29 DIAGNOSIS — I10 Essential (primary) hypertension: Secondary | ICD-10-CM | POA: Diagnosis not present

## 2013-06-29 DIAGNOSIS — Z471 Aftercare following joint replacement surgery: Secondary | ICD-10-CM | POA: Diagnosis not present

## 2013-06-29 DIAGNOSIS — M159 Polyosteoarthritis, unspecified: Secondary | ICD-10-CM | POA: Diagnosis not present

## 2013-06-29 DIAGNOSIS — Z5189 Encounter for other specified aftercare: Secondary | ICD-10-CM | POA: Diagnosis not present

## 2013-06-29 DIAGNOSIS — R269 Unspecified abnormalities of gait and mobility: Secondary | ICD-10-CM | POA: Diagnosis not present

## 2013-06-30 DIAGNOSIS — Z471 Aftercare following joint replacement surgery: Secondary | ICD-10-CM | POA: Diagnosis not present

## 2013-06-30 DIAGNOSIS — R269 Unspecified abnormalities of gait and mobility: Secondary | ICD-10-CM | POA: Diagnosis not present

## 2013-06-30 DIAGNOSIS — Z5189 Encounter for other specified aftercare: Secondary | ICD-10-CM | POA: Diagnosis not present

## 2013-06-30 DIAGNOSIS — M159 Polyosteoarthritis, unspecified: Secondary | ICD-10-CM | POA: Diagnosis not present

## 2013-06-30 DIAGNOSIS — I10 Essential (primary) hypertension: Secondary | ICD-10-CM | POA: Diagnosis not present

## 2013-07-04 DIAGNOSIS — M159 Polyosteoarthritis, unspecified: Secondary | ICD-10-CM | POA: Diagnosis not present

## 2013-07-04 DIAGNOSIS — Z5189 Encounter for other specified aftercare: Secondary | ICD-10-CM | POA: Diagnosis not present

## 2013-07-04 DIAGNOSIS — I10 Essential (primary) hypertension: Secondary | ICD-10-CM | POA: Diagnosis not present

## 2013-07-04 DIAGNOSIS — Z471 Aftercare following joint replacement surgery: Secondary | ICD-10-CM | POA: Diagnosis not present

## 2013-07-04 DIAGNOSIS — R269 Unspecified abnormalities of gait and mobility: Secondary | ICD-10-CM | POA: Diagnosis not present

## 2013-07-14 DIAGNOSIS — I10 Essential (primary) hypertension: Secondary | ICD-10-CM | POA: Diagnosis not present

## 2013-07-14 DIAGNOSIS — Z5189 Encounter for other specified aftercare: Secondary | ICD-10-CM | POA: Diagnosis not present

## 2013-07-14 DIAGNOSIS — M159 Polyosteoarthritis, unspecified: Secondary | ICD-10-CM | POA: Diagnosis not present

## 2013-07-14 DIAGNOSIS — Z471 Aftercare following joint replacement surgery: Secondary | ICD-10-CM | POA: Diagnosis not present

## 2013-07-14 DIAGNOSIS — R269 Unspecified abnormalities of gait and mobility: Secondary | ICD-10-CM | POA: Diagnosis not present

## 2013-07-15 DIAGNOSIS — R269 Unspecified abnormalities of gait and mobility: Secondary | ICD-10-CM | POA: Diagnosis not present

## 2013-07-15 DIAGNOSIS — M159 Polyosteoarthritis, unspecified: Secondary | ICD-10-CM | POA: Diagnosis not present

## 2013-07-15 DIAGNOSIS — I10 Essential (primary) hypertension: Secondary | ICD-10-CM | POA: Diagnosis not present

## 2013-07-15 DIAGNOSIS — Z471 Aftercare following joint replacement surgery: Secondary | ICD-10-CM | POA: Diagnosis not present

## 2013-07-15 DIAGNOSIS — Z5189 Encounter for other specified aftercare: Secondary | ICD-10-CM | POA: Diagnosis not present

## 2013-07-18 DIAGNOSIS — Z471 Aftercare following joint replacement surgery: Secondary | ICD-10-CM | POA: Diagnosis not present

## 2013-07-18 DIAGNOSIS — Z5189 Encounter for other specified aftercare: Secondary | ICD-10-CM | POA: Diagnosis not present

## 2013-07-18 DIAGNOSIS — M159 Polyosteoarthritis, unspecified: Secondary | ICD-10-CM | POA: Diagnosis not present

## 2013-07-18 DIAGNOSIS — I10 Essential (primary) hypertension: Secondary | ICD-10-CM | POA: Diagnosis not present

## 2013-07-18 DIAGNOSIS — R269 Unspecified abnormalities of gait and mobility: Secondary | ICD-10-CM | POA: Diagnosis not present

## 2013-07-22 DIAGNOSIS — Z5189 Encounter for other specified aftercare: Secondary | ICD-10-CM | POA: Diagnosis not present

## 2013-07-22 DIAGNOSIS — R269 Unspecified abnormalities of gait and mobility: Secondary | ICD-10-CM | POA: Diagnosis not present

## 2013-07-22 DIAGNOSIS — M159 Polyosteoarthritis, unspecified: Secondary | ICD-10-CM | POA: Diagnosis not present

## 2013-07-22 DIAGNOSIS — I10 Essential (primary) hypertension: Secondary | ICD-10-CM | POA: Diagnosis not present

## 2013-07-22 DIAGNOSIS — Z471 Aftercare following joint replacement surgery: Secondary | ICD-10-CM | POA: Diagnosis not present

## 2013-07-25 DIAGNOSIS — Z471 Aftercare following joint replacement surgery: Secondary | ICD-10-CM | POA: Diagnosis not present

## 2013-07-25 DIAGNOSIS — M159 Polyosteoarthritis, unspecified: Secondary | ICD-10-CM | POA: Diagnosis not present

## 2013-07-25 DIAGNOSIS — Z5189 Encounter for other specified aftercare: Secondary | ICD-10-CM | POA: Diagnosis not present

## 2013-07-25 DIAGNOSIS — R269 Unspecified abnormalities of gait and mobility: Secondary | ICD-10-CM | POA: Diagnosis not present

## 2013-07-25 DIAGNOSIS — I10 Essential (primary) hypertension: Secondary | ICD-10-CM | POA: Diagnosis not present

## 2013-07-29 DIAGNOSIS — R269 Unspecified abnormalities of gait and mobility: Secondary | ICD-10-CM | POA: Diagnosis not present

## 2013-07-29 DIAGNOSIS — Z471 Aftercare following joint replacement surgery: Secondary | ICD-10-CM | POA: Diagnosis not present

## 2013-07-29 DIAGNOSIS — I10 Essential (primary) hypertension: Secondary | ICD-10-CM | POA: Diagnosis not present

## 2013-07-29 DIAGNOSIS — Z5189 Encounter for other specified aftercare: Secondary | ICD-10-CM | POA: Diagnosis not present

## 2013-07-29 DIAGNOSIS — M159 Polyosteoarthritis, unspecified: Secondary | ICD-10-CM | POA: Diagnosis not present

## 2013-08-01 DIAGNOSIS — I10 Essential (primary) hypertension: Secondary | ICD-10-CM | POA: Diagnosis not present

## 2013-08-01 DIAGNOSIS — Z471 Aftercare following joint replacement surgery: Secondary | ICD-10-CM | POA: Diagnosis not present

## 2013-08-01 DIAGNOSIS — R269 Unspecified abnormalities of gait and mobility: Secondary | ICD-10-CM | POA: Diagnosis not present

## 2013-08-01 DIAGNOSIS — Z5189 Encounter for other specified aftercare: Secondary | ICD-10-CM | POA: Diagnosis not present

## 2013-08-01 DIAGNOSIS — M159 Polyosteoarthritis, unspecified: Secondary | ICD-10-CM | POA: Diagnosis not present

## 2013-08-05 DIAGNOSIS — Z471 Aftercare following joint replacement surgery: Secondary | ICD-10-CM | POA: Diagnosis not present

## 2013-08-05 DIAGNOSIS — M159 Polyosteoarthritis, unspecified: Secondary | ICD-10-CM | POA: Diagnosis not present

## 2013-08-05 DIAGNOSIS — Z5189 Encounter for other specified aftercare: Secondary | ICD-10-CM | POA: Diagnosis not present

## 2013-08-05 DIAGNOSIS — R269 Unspecified abnormalities of gait and mobility: Secondary | ICD-10-CM | POA: Diagnosis not present

## 2013-08-05 DIAGNOSIS — I10 Essential (primary) hypertension: Secondary | ICD-10-CM | POA: Diagnosis not present

## 2013-08-10 DIAGNOSIS — Z471 Aftercare following joint replacement surgery: Secondary | ICD-10-CM | POA: Diagnosis not present

## 2013-08-10 DIAGNOSIS — M159 Polyosteoarthritis, unspecified: Secondary | ICD-10-CM | POA: Diagnosis not present

## 2013-08-10 DIAGNOSIS — Z5189 Encounter for other specified aftercare: Secondary | ICD-10-CM | POA: Diagnosis not present

## 2013-08-10 DIAGNOSIS — I10 Essential (primary) hypertension: Secondary | ICD-10-CM | POA: Diagnosis not present

## 2013-08-10 DIAGNOSIS — R269 Unspecified abnormalities of gait and mobility: Secondary | ICD-10-CM | POA: Diagnosis not present

## 2013-08-11 ENCOUNTER — Ambulatory Visit: Payer: Medicare Other | Admitting: Nurse Practitioner

## 2013-08-12 DIAGNOSIS — I10 Essential (primary) hypertension: Secondary | ICD-10-CM | POA: Diagnosis not present

## 2013-08-12 DIAGNOSIS — R269 Unspecified abnormalities of gait and mobility: Secondary | ICD-10-CM | POA: Diagnosis not present

## 2013-08-12 DIAGNOSIS — Z471 Aftercare following joint replacement surgery: Secondary | ICD-10-CM | POA: Diagnosis not present

## 2013-08-12 DIAGNOSIS — M159 Polyosteoarthritis, unspecified: Secondary | ICD-10-CM | POA: Diagnosis not present

## 2013-08-12 DIAGNOSIS — Z5189 Encounter for other specified aftercare: Secondary | ICD-10-CM | POA: Diagnosis not present

## 2013-08-29 ENCOUNTER — Emergency Department (HOSPITAL_COMMUNITY): Payer: Medicare Other

## 2013-08-29 ENCOUNTER — Encounter (HOSPITAL_COMMUNITY): Payer: Self-pay | Admitting: Emergency Medicine

## 2013-08-29 ENCOUNTER — Observation Stay (HOSPITAL_COMMUNITY)
Admission: EM | Admit: 2013-08-29 | Discharge: 2013-08-30 | Disposition: A | Discharge status: Home / Self Care | Payer: Medicare Other | Attending: Family Medicine | Admitting: Family Medicine

## 2013-08-29 DIAGNOSIS — R071 Chest pain on breathing: Secondary | ICD-10-CM | POA: Diagnosis not present

## 2013-08-29 DIAGNOSIS — I517 Cardiomegaly: Secondary | ICD-10-CM | POA: Diagnosis not present

## 2013-08-29 DIAGNOSIS — Z87891 Personal history of nicotine dependence: Secondary | ICD-10-CM | POA: Insufficient documentation

## 2013-08-29 DIAGNOSIS — Z96659 Presence of unspecified artificial knee joint: Secondary | ICD-10-CM | POA: Insufficient documentation

## 2013-08-29 DIAGNOSIS — R911 Solitary pulmonary nodule: Secondary | ICD-10-CM | POA: Diagnosis not present

## 2013-08-29 DIAGNOSIS — R110 Nausea: Secondary | ICD-10-CM | POA: Diagnosis not present

## 2013-08-29 DIAGNOSIS — E669 Obesity, unspecified: Secondary | ICD-10-CM | POA: Diagnosis not present

## 2013-08-29 DIAGNOSIS — Z01810 Encounter for preprocedural cardiovascular examination: Secondary | ICD-10-CM

## 2013-08-29 DIAGNOSIS — R079 Chest pain, unspecified: Principal | ICD-10-CM | POA: Insufficient documentation

## 2013-08-29 DIAGNOSIS — I319 Disease of pericardium, unspecified: Secondary | ICD-10-CM | POA: Diagnosis not present

## 2013-08-29 DIAGNOSIS — I2510 Atherosclerotic heart disease of native coronary artery without angina pectoris: Secondary | ICD-10-CM | POA: Diagnosis not present

## 2013-08-29 DIAGNOSIS — J90 Pleural effusion, not elsewhere classified: Secondary | ICD-10-CM | POA: Diagnosis not present

## 2013-08-29 DIAGNOSIS — M1990 Unspecified osteoarthritis, unspecified site: Secondary | ICD-10-CM | POA: Diagnosis not present

## 2013-08-29 DIAGNOSIS — I10 Essential (primary) hypertension: Secondary | ICD-10-CM | POA: Insufficient documentation

## 2013-08-29 DIAGNOSIS — Z79899 Other long term (current) drug therapy: Secondary | ICD-10-CM | POA: Diagnosis not present

## 2013-08-29 DIAGNOSIS — D500 Iron deficiency anemia secondary to blood loss (chronic): Secondary | ICD-10-CM

## 2013-08-29 DIAGNOSIS — Z96651 Presence of right artificial knee joint: Secondary | ICD-10-CM

## 2013-08-29 DIAGNOSIS — R0989 Other specified symptoms and signs involving the circulatory and respiratory systems: Secondary | ICD-10-CM | POA: Diagnosis not present

## 2013-08-29 LAB — POCT I-STAT, CHEM 8
BUN: 16 mg/dL (ref 6–23)
CHLORIDE: 107 meq/L (ref 96–112)
Calcium, Ion: 1.17 mmol/L (ref 1.13–1.30)
Creatinine, Ser: 0.9 mg/dL (ref 0.50–1.10)
GLUCOSE: 103 mg/dL — AB (ref 70–99)
HEMATOCRIT: 38 % (ref 36.0–46.0)
HEMOGLOBIN: 12.9 g/dL (ref 12.0–15.0)
POTASSIUM: 4.1 meq/L (ref 3.7–5.3)
Sodium: 144 mEq/L (ref 137–147)
TCO2: 25 mmol/L (ref 0–100)

## 2013-08-29 LAB — BASIC METABOLIC PANEL
BUN: 16 mg/dL (ref 6–23)
CALCIUM: 8.9 mg/dL (ref 8.4–10.5)
CO2: 26 mEq/L (ref 19–32)
Chloride: 106 mEq/L (ref 96–112)
Creatinine, Ser: 0.81 mg/dL (ref 0.50–1.10)
GFR, EST AFRICAN AMERICAN: 88 mL/min — AB (ref >90)
GFR, EST NON AFRICAN AMERICAN: 76 mL/min — AB (ref >90)
GLUCOSE: 105 mg/dL — AB (ref 70–99)
POTASSIUM: 4.3 meq/L (ref 3.7–5.3)
Sodium: 141 mEq/L (ref 137–147)

## 2013-08-29 LAB — POCT I-STAT TROPONIN I: Troponin i, poc: 0 ng/mL (ref 0.00–0.08)

## 2013-08-29 LAB — CBC
HCT: 36.1 % (ref 36.0–46.0)
HEMOGLOBIN: 11.4 g/dL — AB (ref 12.0–15.0)
MCH: 26.5 pg (ref 26.0–34.0)
MCHC: 31.6 g/dL (ref 30.0–36.0)
MCV: 83.8 fL (ref 78.0–100.0)
Platelets: 303 10*3/uL (ref 150–400)
RBC: 4.31 MIL/uL (ref 3.87–5.11)
RDW: 14.5 % (ref 11.5–15.5)
WBC: 5.6 10*3/uL (ref 4.0–10.5)

## 2013-08-29 LAB — LIPID PANEL
CHOL/HDL RATIO: 3.4 ratio
Cholesterol: 147 mg/dL (ref 0–200)
HDL: 43 mg/dL (ref >39)
LDL Cholesterol: 81 mg/dL (ref 0–99)
TRIGLYCERIDES: 117 mg/dL (ref <150)
VLDL: 23 mg/dL (ref 0–40)

## 2013-08-29 LAB — D-DIMER, QUANTITATIVE (NOT AT ARMC): D DIMER QUANT: 3.24 ug{FEU}/mL — AB (ref 0.00–0.48)

## 2013-08-29 LAB — TROPONIN I: Troponin I: <0.3 ng/mL (ref <0.30)

## 2013-08-29 MED ORDER — MORPHINE SULFATE 2 MG/ML IJ SOLN
1.0000 mg | INTRAMUSCULAR | Status: DC | PRN
  Administered 2013-08-29: 1 mg via INTRAVENOUS
  Filled 2013-08-29: qty 1

## 2013-08-29 MED ORDER — FERROUS SULFATE 325 (65 FE) MG PO TABS
325.0000 mg | ORAL_TABLET | Freq: Three times a day (TID) | ORAL | Status: DC
  Administered 2013-08-30 (×2): 325 mg via ORAL
  Filled 2013-08-29 (×4): qty 1

## 2013-08-29 MED ORDER — NITROGLYCERIN 0.4 MG SL SUBL
0.4000 mg | SUBLINGUAL_TABLET | SUBLINGUAL | Status: AC | PRN
  Administered 2013-08-29 (×3): 0.4 mg via SUBLINGUAL
  Filled 2013-08-29: qty 25

## 2013-08-29 MED ORDER — POLYETHYLENE GLYCOL 3350 17 G PO PACK
17.0000 g | PACK | Freq: Two times a day (BID) | ORAL | Status: DC
  Filled 2013-08-29 (×3): qty 1

## 2013-08-29 MED ORDER — ASPIRIN 81 MG PO CHEW
324.0000 mg | CHEWABLE_TABLET | Freq: Once | ORAL | Status: AC
  Administered 2013-08-29: 324 mg via ORAL
  Filled 2013-08-29: qty 4

## 2013-08-29 MED ORDER — ADULT MULTIVITAMIN W/MINERALS CH
1.0000 | ORAL_TABLET | Freq: Every day | ORAL | Status: DC
  Administered 2013-08-30: 10:00:00 1 via ORAL
  Filled 2013-08-29: qty 1

## 2013-08-29 MED ORDER — NITROGLYCERIN 0.4 MG SL SUBL: 0.4000 mg | SUBLINGUAL_TABLET | SUBLINGUAL | Status: DC | PRN

## 2013-08-29 MED ORDER — ACETAMINOPHEN 500 MG PO TABS: 500.0000 mg | ORAL_TABLET | Freq: Four times a day (QID) | ORAL | Status: DC | PRN

## 2013-08-29 MED ORDER — ASPIRIN EC 325 MG PO TBEC
325.0000 mg | DELAYED_RELEASE_TABLET | Freq: Every day | ORAL | Status: DC
  Administered 2013-08-29 – 2013-08-30 (×2): 325 mg via ORAL
  Filled 2013-08-29 (×2): qty 1

## 2013-08-29 MED ORDER — SODIUM CHLORIDE 0.9 % IJ SOLN
3.0000 mL | Freq: Two times a day (BID) | INTRAMUSCULAR | Status: DC
  Administered 2013-08-30: 10:00:00 3 mL via INTRAVENOUS

## 2013-08-29 MED ORDER — PANTOPRAZOLE SODIUM 40 MG PO TBEC
40.0000 mg | DELAYED_RELEASE_TABLET | Freq: Every day | ORAL | Status: DC
  Administered 2013-08-29 – 2013-08-30 (×2): 40 mg via ORAL
  Filled 2013-08-29 (×2): qty 1

## 2013-08-29 MED ORDER — ONE-DAILY MULTI VITAMINS PO TABS: 1.0000 | ORAL_TABLET | Freq: Every day | ORAL | Status: DC

## 2013-08-29 MED ORDER — SODIUM CHLORIDE 0.9 % IJ SOLN: 3.0000 mL | INTRAMUSCULAR | Status: DC | PRN

## 2013-08-29 MED ORDER — IOHEXOL 350 MG/ML SOLN
100.0000 mL | Freq: Once | INTRAVENOUS | Status: AC | PRN
  Administered 2013-08-29: 100 mL via INTRAVENOUS

## 2013-08-29 MED ORDER — SODIUM CHLORIDE 0.9 % IV SOLN: 250.0000 mL | INTRAVENOUS | Status: DC | PRN

## 2013-08-29 MED ORDER — ENOXAPARIN SODIUM 100 MG/ML ~~LOC~~ SOLN
100.0000 mg | SUBCUTANEOUS | Status: AC
  Administered 2013-08-29: 100 mg via SUBCUTANEOUS
  Filled 2013-08-29: qty 1

## 2013-08-29 MED ORDER — SODIUM CHLORIDE 0.9 % IJ SOLN: 3.0000 mL | Freq: Two times a day (BID) | INTRAMUSCULAR | Status: DC

## 2013-08-29 MED ORDER — LISINOPRIL 5 MG PO TABS
5.0000 mg | ORAL_TABLET | Freq: Every day | ORAL | Status: DC
  Administered 2013-08-29 – 2013-08-30 (×2): 5 mg via ORAL
  Filled 2013-08-29 (×2): qty 1

## 2013-08-29 MED ORDER — GI COCKTAIL ~~LOC~~: 30.0000 mL | Freq: Once | ORAL | Status: DC

## 2013-08-29 MED ORDER — AMLODIPINE BESYLATE 10 MG PO TABS
10.0000 mg | ORAL_TABLET | Freq: Every day | ORAL | Status: DC
  Administered 2013-08-29 – 2013-08-30 (×2): 10 mg via ORAL
  Filled 2013-08-29 (×2): qty 1

## 2013-08-29 NOTE — ED Notes (Signed)
Pt returned from xray

## 2013-08-29 NOTE — H&P (Addendum)
Hospitalist Admission History and Physical  Patient name: Tiffany [name] Medical record number: [id number] Date of birth: 04-Feb-1951 Age: 63 y.o. Gender: female  Primary Care Provider: Salena Saner., MD  Chief Complaint: Chest Painn  History of Present Illness:This is a 63 y.o. year old female with past medical history of HTN, obesity, 1/2 PPD smoking presenting with chest pain. Pt woke up this am with central-r sided chest pain.  Pain did not wake pt from sleep. Pt states that pain persisted throughout day. Used some tums with no relief. Denies any recent strenuous activity. Denies heartburn sxs including epigastric pain. Pain is sharp, 5/10 at its worst. Without radiation. No alleviating or aggravating factors. No assd diaphoresis. Has had some nausea that has been somewhat of a chronic issue. No fevers or chills. Pt noted to have had a L TKR 3 months ago. Had a cardiac preoperative clearance with [facility] that did not require stress testing.  In the ER pt had elevated d-dimer > 3. CT negative for PE. Noted 3.5 mm R major fissure mass in setting of smoking. LE u/s pending. EKG and 1st trop in ER WNL.  cards consulted by EDP w/ recs for CP rule out per EDP.   CV RFs: Age, HTN, obesity, smoking history, + family hx/o heart disease (multiple family members with heart failure.   Patient Active Problem List   Diagnosis Date Noted  . Chest pain 08/29/2013  . Hypertension   . Expected blood loss anemia 06/01/2013  . Morbid obesity 06/01/2013  . 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 Preop cardiovascular exam 02/18/2013   Past Medical History: Past Medical History  Diagnosis Date  . Arthritis     osteoarthritis  . Hx of bronchitis   . Hypertension     Past Surgical History: Past Surgical History  Procedure Laterality Date  . Knee surgery      arthroscopic right  . Toe nail removal    . Total knee arthroplasty Left 05/31/2013    Procedure: LEFT TOTAL KNEE ARTHROPLASTY;  Surgeon:  Mauri Pole, MD;  Location: [facility];  Service: Orthopedics;  Laterality: Left;    Social History: History   Social History  . Marital Status: Single    Spouse Name: N/A    Number of Children: 2  . Years of Education: N/A   Occupational History  .     Social History Main Topics  . Smoking status: Current Every Day Smoker -- 0.25 packs/day for 16 years    Types: Cigarettes  . Smokeless tobacco: Never Used  . Alcohol Use: No  . Drug Use: No  . Sexual Activity: Not Currently   Other Topics Concern  . None   Social History Narrative   Daughter, son and grandson live with her.     Family History: Family History  Problem Relation Age of Onset  . Heart disease Father     Died of CHF age 59  . Heart disease Mother     Died of CHF age 23  . Cancer Sister     Cancer  . Heart failure Brother     Died of CHF     Allergies: No Known Allergies  Current Facility-Administered Medications  Medication Dose Route Frequency Provider Last Rate Last Dose  . 0.9 %  sodium chloride infusion  250 mL Intravenous PRN Shanda Howells, MD      . amLODipine (NORVASC) tablet 10 mg  10 mg Oral Daily Shanda Howells, MD      .  aspirin EC tablet 325 mg  325 mg Oral Daily Shanda Howells, MD      . Derrill Memo ON 08/30/2013] ferrous sulfate tablet 325 mg  325 mg Oral TID PC Shanda Howells, MD      . lisinopril (PRINIVIL,ZESTRIL) tablet 5 mg  5 mg Oral Daily Shanda Howells, MD      . multivitamin tablet 1 tablet  1 tablet Oral Daily Shanda Howells, MD      . polyethylene glycol (MIRALAX / GLYCOLAX) packet 17 g  17 g Oral BID Shanda Howells, MD      . sodium chloride 0.9 % injection 3 mL  3 mL Intravenous Q12H Shanda Howells, MD      . sodium chloride 0.9 % injection 3 mL  3 mL Intravenous Q12H Shanda Howells, MD      . sodium chloride 0.9 % injection 3 mL  3 mL Intravenous PRN Shanda Howells, MD       Current Outpatient Prescriptions  Medication Sig Dispense Refill  . amLODipine (NORVASC) 5 MG tablet Take 5 mg  by mouth daily before breakfast.      . ferrous sulfate 325 (65 FE) MG tablet Take 1 tablet (325 mg total) by mouth 3 (three) times daily after meals.    3  . Multiple Vitamin (MULTIVITAMIN) tablet Take 1 tablet by mouth daily.      . polyethylene glycol (MIRALAX / GLYCOLAX) packet Take 17 g by mouth 2 (two) times daily.  14 each  0   Review Of Systems: 12 point ROS negative except as noted above in HPI.  Physical Exam: Filed Vitals:   08/29/13 1800  BP: 159/87  Pulse: 70  Temp:   Resp: 20    General: alert, cooperative and morbidly obese HEENT: PERRLA and extra ocular movement intact Heart: S1, S2 normal, no murmur, rub or gallop, regular rate and rhythm. + R sided chest wall TTP. Lungs: clear to auscultation, no wheezes or rales and unlabored breathing Abdomen: abdomen is soft without significant tenderness, masses, organomegaly or guarding Extremities: extremities normal, atraumatic, no cyanosis or edema Skin:no rashes, no ecchymoses Neurology: normal without focal findings, mental status, speech normal, alert and oriented x3, PERLA and reflexes normal and symmetric  Labs and Imaging: Lab Results  Component Value Date/Time   NA 144 08/29/2013  3:35 PM   K 4.1 08/29/2013  3:35 PM   CL 107 08/29/2013  3:35 PM   CO2 26 08/29/2013  3:25 PM   BUN 16 08/29/2013  3:35 PM   CREATININE 0.90 08/29/2013  3:35 PM   GLUCOSE 103* 08/29/2013  3:35 PM   Lab Results  Component Value Date   WBC 5.6 08/29/2013   HGB 12.9 08/29/2013   HCT 38.0 08/29/2013   MCV 83.8 08/29/2013   PLT 303 08/29/2013    Dg Chest 2 View  08/29/2013   CLINICAL DATA:  Right-sided anterior chest pain for 1 day  EXAM: CHEST  2 VIEW  COMPARISON:  04/28/2013  FINDINGS: Mild to moderate cardiac enlargement stable. Mild vascular congestion stable. No consolidation, effusion, or pulmonary edema.  IMPRESSION: Similar to the prior study with cardiac enlargement and vascular congestion but no evidence of pulmonary edema.   Electronically Signed    By: Skipper Cliche M.D.   On: 08/29/2013 16:05   Ct Angio Chest Pe W/cm &/or Wo Cm  08/29/2013   CLINICAL DATA:  Right-sided chest pain  EXAM: CT ANGIOGRAPHY CHEST WITH CONTRAST  TECHNIQUE: Multidetector CT imaging of the chest  was performed using the standard protocol during bolus administration of intravenous contrast. Multiplanar CT image reconstructions including MIPs were obtained to evaluate the vascular anatomy.  CONTRAST:  19mL OMNIPAQUE IOHEXOL 350 MG/ML SOLN  COMPARISON:  Prior radiograph performed earlier on the same day  FINDINGS: No pathologically enlarged mediastinal, hilar, or axillary lymph nodes are identified.  Aorta great vessels are within normal limits. Calcified plaque noted within the aortic arch and at the origin of the great vessels.  There is mild cardiomegaly with diffuse 3 vessel coronary artery calcifications. A small pericardial effusion is noted.  Pulmonary arterial tree is well opacified. No filling defects are seen to suggest acute pulmonary embolism. Re-formatted imaging confirms these findings.  The lungs are clear without focal infiltrate or pulmonary edema. No pleural effusion. Mild subsegmental atelectasis seen dependently within both lower lobes. A 5 mm nodule is seen along the right major fissure (series 12, image 41). No other pulmonary nodule or mass.  Visualized portions of the upper abdomen are unremarkable.  No acute osseous abnormality. No focal lytic or blastic osseous lesions.  IMPRESSION: 1. No CT evidence of acute pulmonary embolism. 2. Small pericardial effusion. 3. 5 mm nodule along the right major fissure within the superior segment of the right lower lobe. If the patient has underlying risk factors (i.e. smoking), a follow-up study in 6 months is recommended to evaluate stability/ potential interval change. If there are no underlying risk factors, a follow-up study in 12 months is recommended to ensure stability. 4. Mild cardiomegaly with diffuse 3 vessel  coronary artery calcifications.   Electronically Signed   By: Jeannine Boga M.D.   On: 08/29/2013 17:29     Assessment and Plan: GIOVANNY ESCHRICH is a 63 y.o. year old female presenting with chest pain   Chest Pain: Fairly atypical in nature. Multiple CV RFs. Will risk stratify pt. Check A1C, TSH, lipid profile. Full dose ASA. Prn nitro. Noted elevated D-dimer in morbidly obese female-active smoker. Non tachycardic, non hypoxic. L TKR  3+ months ago.Wells score around 1. CTA negative for PE. Lovenox full dose x1 pending LE u/s in setting of active chest pain. Noted anterior chest wall TTP on exam. May likely be a component of costochondritis. Tylenol prn. Also start on ppi.   HTN: Poorly controlled today. Will increase norvasc to 10mg  daily. Add on ACE and uptitrate as needed throughout stay as needed. Hydralazine IV prn for recalcitrant BPs.    FEN/GI: heart healthy diet. PPI  Prophylaxis: full dose lovenox x 1  Disposition: pending further eval .  Code Status:Full Code.         Shanda Howells MD  Pager: [phone]

## 2013-08-29 NOTE — ED Notes (Signed)
Patient transported to X-ray 

## 2013-08-29 NOTE — ED Notes (Signed)
Per pt starting experiencing right sided cp under breast since 0400 this am. Pt thought "it was gas pain; took Rolaids." Pt reports pain did not ease. Pain 10/10. Pt denies SOB, dizziness. Pt reports hx of hypertension and current everyday smoker.

## 2013-08-29 NOTE — ED Provider Notes (Signed)
CSN: [id number]     Arrival date & time 08/29/13  1447 History   First MD Initiated Contact with Patient 08/29/13 1513     Chief Complaint  Patient presents with  . Chest Pain   (Consider location/radiation/quality/duration/timing/severity/associated sxs/prior Treatment) HPI Comments: Patient presents with right-sided chest pain. She states she woke up about 4 AM this morning with onset of sharp pain under her right breast. It is nonradiating. It is a little bit worse with deep breathing. She denies any shortness of breath. She's had some very mild intermittent she had no vomiting. She denies any diaphoresis. She took some TUMS without relief. She states it's worse when she moves around but does not really seem to be exertional. She denies any leg pain or swelling. She does have a history of hypertension and isn't everyday smoker she denies any past history of heart problems. She has a family history of congestive heart failure which caused a death in her brother at age 67 and her mother at age 60.  Patient is a 63 y.o. female presenting with chest pain.  Chest Pain Associated symptoms: nausea   Associated symptoms: no abdominal pain, no back pain, no cough, no diaphoresis, no dizziness, no fatigue, no fever, no headache, no numbness, no shortness of breath, not vomiting and no weakness     Past Medical History  Diagnosis Date  . Arthritis     osteoarthritis  . Hx of bronchitis   . Hypertension    Past Surgical History  Procedure Laterality Date  . Knee surgery      arthroscopic right  . Toe nail removal    . 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 Heart disease Father     Died of CHF age 70  . Heart disease Mother     Died of CHF age 49  . Cancer Sister     Cancer  . Heart failure Brother      Died of CHF    History  Substance Use Topics  . Smoking status: Current Every Day Smoker -- 0.25 packs/day for 16 years    Types: Cigarettes  . Smokeless tobacco: Never Used  . Alcohol Use: No   OB History   Grav Para Term Preterm Abortions TAB SAB Ect Mult Living                 Review of Systems  Constitutional: Negative for fever, chills, diaphoresis and fatigue.  HENT: Negative for congestion, rhinorrhea and sneezing.   Eyes: Negative.   Respiratory: Negative for cough, chest tightness and shortness of breath.   Cardiovascular: Positive for chest pain. Negative for leg swelling.  Gastrointestinal: Positive for nausea. Negative for vomiting, abdominal pain, diarrhea and blood in stool.  Genitourinary: Negative for frequency, flank pain and difficulty urinating.  Musculoskeletal: Negative for arthralgias and back pain.  Skin: Negative for rash.  Neurological: Negative for dizziness, speech difficulty, weakness, numbness and headaches.    Allergies  Review of patient's allergies indicates no known allergies.  Home Medications   Current Outpatient Rx  Name  Route  Sig  Dispense  Refill  . amLODipine (NORVASC) 5 MG tablet   Oral   Take 5 mg by mouth daily before breakfast.         . ferrous sulfate  325 (65 FE) MG tablet   Oral   Take 1 tablet (325 mg total) by mouth 3 (three) times daily after meals.      3   . Multiple Vitamin (MULTIVITAMIN) tablet   Oral   Take 1 tablet by mouth daily.         . polyethylene glycol (MIRALAX / GLYCOLAX) packet   Oral   Take 17 g by mouth 2 (two) times daily.   14 each   0    BP 159/87  Pulse 70  Temp(Src) 98 F (36.7 C) (Oral)  Resp 20  SpO2 100% Physical Exam  Constitutional: She is oriented to person, place, and time. She appears well-developed and well-nourished.  HENT:  Head: Normocephalic and atraumatic.  Eyes: Pupils are equal, round, and reactive to light.  Neck: Normal range of motion. Neck supple.   Cardiovascular: Normal rate, regular rhythm and normal heart sounds.   Pulmonary/Chest: Effort normal and breath sounds normal. No respiratory distress. She has no wheezes. She has no rales. She exhibits tenderness (+TTP right mid chest wall).  Abdominal: Soft. Bowel sounds are normal. There is no tenderness. There is no rebound and no guarding.  Musculoskeletal: Normal range of motion. She exhibits no edema.  Trace bilateral pitting edema. No calf tenderness  Lymphadenopathy:    She has no cervical adenopathy.  Neurological: She is alert and oriented to person, place, and time.  Skin: Skin is warm and dry. No rash noted.  Psychiatric: She has a normal mood and affect.    ED Course  Procedures (including critical care time) Labs Review Results for orders placed during the hospital encounter of 08/29/13  CBC      Result Value Range   WBC 5.6  4.0 - 10.5 K/uL   RBC 4.31  3.87 - 5.11 MIL/uL   Hemoglobin 11.4 (*) 12.0 - 15.0 g/dL   HCT 36.1  36.0 - 46.0 %   MCV 83.8  78.0 - 100.0 fL   MCH 26.5  26.0 - 34.0 pg   MCHC 31.6  30.0 - 36.0 g/dL   RDW 14.5  11.5 - 15.5 %   Platelets 303  150 - 400 K/uL  BASIC METABOLIC PANEL      Result Value Range   Sodium 141  137 - 147 mEq/L   Potassium 4.3  3.7 - 5.3 mEq/L   Chloride 106  96 - 112 mEq/L   CO2 26  19 - 32 mEq/L   Glucose, Bld 105 (*) 70 - 99 mg/dL   BUN 16  6 - 23 mg/dL   Creatinine, Ser 0.81  0.50 - 1.10 mg/dL   Calcium 8.9  8.4 - 10.5 mg/dL   GFR calc non Af Amer 76 (*) >90 mL/min   GFR calc Af Amer 88 (*) >90 mL/min  D-DIMER, QUANTITATIVE      Result Value Range   D-Dimer, Quant 3.24 (*) 0.00 - 0.48 ug/mL-FEU  POCT I-STAT, CHEM 8      Result Value Range   Sodium 144  137 - 147 mEq/L   Potassium 4.1  3.7 - 5.3 mEq/L   Chloride 107  96 - 112 mEq/L   BUN 16  6 - 23 mg/dL   Creatinine, Ser 0.90  0.50 - 1.10 mg/dL   Glucose, Bld 103 (*) 70 - 99 mg/dL   Calcium, Ion 1.17  1.13 - 1.30 mmol/L   TCO2 25  0 - 100 mmol/L    Hemoglobin 12.9  12.0 -  15.0 g/dL   HCT 38.0  36.0 - 46.0 %  POCT I-STAT TROPONIN I      Result Value Range   Troponin i, poc 0.00  0.00 - 0.08 ng/mL   Comment 3            Dg Chest 2 View  08/29/2013   CLINICAL DATA:  Right-sided anterior chest pain for 1 day  EXAM: CHEST  2 VIEW  COMPARISON:  04/28/2013  FINDINGS: Mild to moderate cardiac enlargement stable. Mild vascular congestion stable. No consolidation, effusion, or pulmonary edema.  IMPRESSION: Similar to the prior study with cardiac enlargement and vascular congestion but no evidence of pulmonary edema.   Electronically Signed   By: Skipper Cliche M.D.   On: 08/29/2013 16:05   Ct Angio Chest Pe W/cm &/or Wo Cm  08/29/2013   CLINICAL DATA:  Right-sided chest pain  EXAM: CT ANGIOGRAPHY CHEST WITH CONTRAST  TECHNIQUE: Multidetector CT imaging of the chest was performed using the standard protocol during bolus administration of intravenous contrast. Multiplanar CT image reconstructions including MIPs were obtained to evaluate the vascular anatomy.  CONTRAST:  116mL OMNIPAQUE IOHEXOL 350 MG/ML SOLN  COMPARISON:  Prior radiograph performed earlier on the same day  FINDINGS: No pathologically enlarged mediastinal, hilar, or axillary lymph nodes are identified.  Aorta great vessels are within normal limits. Calcified plaque noted within the aortic arch and at the origin of the great vessels.  There is mild cardiomegaly with diffuse 3 vessel coronary artery calcifications. A small pericardial effusion is noted.  Pulmonary arterial tree is well opacified. No filling defects are seen to suggest acute pulmonary embolism. Re-formatted imaging confirms these findings.  The lungs are clear without focal infiltrate or pulmonary edema. No pleural effusion. Mild subsegmental atelectasis seen dependently within both lower lobes. A 5 mm nodule is seen along the right major fissure (series 12, image 41). No other pulmonary nodule or mass.  Visualized portions of the  upper abdomen are unremarkable.  No acute osseous abnormality. No focal lytic or blastic osseous lesions.  IMPRESSION: 1. No CT evidence of acute pulmonary embolism. 2. Small pericardial effusion. 3. 5 mm nodule along the right major fissure within the superior segment of the right lower lobe. If the patient has underlying risk factors (i.e. smoking), a follow-up study in 6 months is recommended to evaluate stability/ potential interval change. If there are no underlying risk factors, a follow-up study in 12 months is recommended to ensure stability. 4. Mild cardiomegaly with diffuse 3 vessel coronary artery calcifications.   Electronically Signed   By: Jeannine Boga M.D.   On: 08/29/2013 17:29    Imaging Review Dg Chest 2 View  08/29/2013   CLINICAL DATA:  Right-sided anterior chest pain for 1 day  EXAM: CHEST  2 VIEW  COMPARISON:  04/28/2013  FINDINGS: Mild to moderate cardiac enlargement stable. Mild vascular congestion stable. No consolidation, effusion, or pulmonary edema.  IMPRESSION: Similar to the prior study with cardiac enlargement and vascular congestion but no evidence of pulmonary edema.   Electronically Signed   By: Skipper Cliche M.D.   On: 08/29/2013 16:05   Ct Angio Chest Pe W/cm &/or Wo Cm  08/29/2013   CLINICAL DATA:  Right-sided chest pain  EXAM: CT ANGIOGRAPHY CHEST WITH CONTRAST  TECHNIQUE: Multidetector CT imaging of the chest was performed using the standard protocol during bolus administration of intravenous contrast. Multiplanar CT image reconstructions including MIPs were obtained to evaluate the vascular anatomy.  CONTRAST:  149mL OMNIPAQUE IOHEXOL 350 MG/ML SOLN  COMPARISON:  Prior radiograph performed earlier on the same day  FINDINGS: No pathologically enlarged mediastinal, hilar, or axillary lymph nodes are identified.  Aorta great vessels are within normal limits. Calcified plaque noted within the aortic arch and at the origin of the great vessels.  There is mild  cardiomegaly with diffuse 3 vessel coronary artery calcifications. A small pericardial effusion is noted.  Pulmonary arterial tree is well opacified. No filling defects are seen to suggest acute pulmonary embolism. Re-formatted imaging confirms these findings.  The lungs are clear without focal infiltrate or pulmonary edema. No pleural effusion. Mild subsegmental atelectasis seen dependently within both lower lobes. A 5 mm nodule is seen along the right major fissure (series 12, image 41). No other pulmonary nodule or mass.  Visualized portions of the upper abdomen are unremarkable.  No acute osseous abnormality. No focal lytic or blastic osseous lesions.  IMPRESSION: 1. No CT evidence of acute pulmonary embolism. 2. Small pericardial effusion. 3. 5 mm nodule along the right major fissure within the superior segment of the right lower lobe. If the patient has underlying risk factors (i.e. smoking), a follow-up study in 6 months is recommended to evaluate stability/ potential interval change. If there are no underlying risk factors, a follow-up study in 12 months is recommended to ensure stability. 4. Mild cardiomegaly with diffuse 3 vessel coronary artery calcifications.   Electronically Signed   By: Jeannine Boga M.D.   On: 08/29/2013 17:29    EKG Interpretation    Date/Time:  Monday August 29 2013 14:58:01 EST Ventricular Rate:  82 PR Interval:  138 QRS Duration: 77 QT Interval:  397 QTC Calculation: 464 R Axis:   60 Text Interpretation:  Sinus rhythm Borderline repolarization abnormality since last tracing no significant change Confirmed by Emmagene Ortner  MD, Rocco Kerkhoff ([id number]) on 08/29/2013 3:15:29 PM            MDM   1. Chest pain    Patient presents with atypical type chest pain. She has no ischemic changes on her EKG. Her troponin is negative. Her CT was negative for pulmonary embolus. She was given nitroglycerin and aspirin and was pain-free after the nitroglycerin. I discussed the case  with the hospitalist on call who recommended I call a cardiologist as she has seen [place] [facility] in the past. I talked with Dr Harl Bowie with [facility] cardiology who recommends the patient be admitted by the hospitalist service. I did notify the patient about the pulmonary nodule that will need repeat CT scanning in about 6 months.    Malvin Johns, MD 08/29/13 [phone]

## 2013-08-30 DIAGNOSIS — Z87891 Personal history of nicotine dependence: Secondary | ICD-10-CM | POA: Diagnosis not present

## 2013-08-30 DIAGNOSIS — R079 Chest pain, unspecified: Secondary | ICD-10-CM | POA: Diagnosis not present

## 2013-08-30 DIAGNOSIS — E669 Obesity, unspecified: Secondary | ICD-10-CM | POA: Diagnosis not present

## 2013-08-30 DIAGNOSIS — I10 Essential (primary) hypertension: Secondary | ICD-10-CM | POA: Diagnosis not present

## 2013-08-30 LAB — COMPREHENSIVE METABOLIC PANEL
ALBUMIN: 3 g/dL — AB (ref 3.5–5.2)
ALK PHOS: 141 U/L — AB (ref 39–117)
ALT: 17 U/L (ref 0–35)
AST: 19 U/L (ref 0–37)
BUN: 17 mg/dL (ref 6–23)
CALCIUM: 8.6 mg/dL (ref 8.4–10.5)
CO2: 25 mEq/L (ref 19–32)
Chloride: 105 mEq/L (ref 96–112)
Creatinine, Ser: 0.8 mg/dL (ref 0.50–1.10)
GFR calc Af Amer: 90 mL/min — ABNORMAL LOW (ref >90)
GFR calc non Af Amer: 77 mL/min — ABNORMAL LOW (ref >90)
GLUCOSE: 104 mg/dL — AB (ref 70–99)
POTASSIUM: 4.1 meq/L (ref 3.7–5.3)
SODIUM: 140 meq/L (ref 137–147)
Total Bilirubin: <0.2 mg/dL — ABNORMAL LOW (ref 0.3–1.2)
Total Protein: 7.2 g/dL (ref 6.0–8.3)

## 2013-08-30 LAB — CBC
HCT: 35.7 % — ABNORMAL LOW (ref 36.0–46.0)
Hemoglobin: 11.3 g/dL — ABNORMAL LOW (ref 12.0–15.0)
MCH: 26.7 pg (ref 26.0–34.0)
MCHC: 31.7 g/dL (ref 30.0–36.0)
MCV: 84.2 fL (ref 78.0–100.0)
Platelets: 292 10*3/uL (ref 150–400)
RBC: 4.24 MIL/uL (ref 3.87–5.11)
RDW: 14.7 % (ref 11.5–15.5)
WBC: 7.3 10*3/uL (ref 4.0–10.5)

## 2013-08-30 LAB — HEMOGLOBIN A1C
Hgb A1c MFr Bld: 6.9 % — ABNORMAL HIGH (ref <5.7)
MEAN PLASMA GLUCOSE: 151 mg/dL — AB (ref <117)

## 2013-08-30 LAB — TSH: TSH: 1.479 u[IU]/mL (ref 0.350–4.500)

## 2013-08-30 LAB — TROPONIN I: Troponin I: <0.3 ng/mL (ref <0.30)

## 2013-08-30 MED ORDER — ASPIRIN EC 81 MG PO TBEC: 81.0000 mg | DELAYED_RELEASE_TABLET | Freq: Every day | ORAL | Status: DC

## 2013-08-30 MED ORDER — LISINOPRIL 5 MG PO TABS: 5.0000 mg | ORAL_TABLET | Freq: Every day | ORAL | Status: DC

## 2013-08-30 MED ORDER — PANTOPRAZOLE SODIUM 40 MG PO TBEC: 40.0000 mg | DELAYED_RELEASE_TABLET | Freq: Every day | ORAL | Status: DC

## 2013-08-30 MED ORDER — AMLODIPINE BESYLATE 10 MG PO TABS: 10.0000 mg | ORAL_TABLET | Freq: Every day | ORAL | Status: DC

## 2013-08-30 MED ORDER — HYDRALAZINE HCL 20 MG/ML IJ SOLN: 5.0000 mg | Freq: Four times a day (QID) | INTRAMUSCULAR | Status: DC | PRN

## 2013-08-30 NOTE — Discharge Summary (Signed)
Physician Discharge Summary  Tiffany [name] NKN:[id number] DOB: 01/07/1951 DOA: 08/29/2013  PCP: Salena Saner., MD  Admit date: 08/29/2013 Discharge date: 08/30/2013  Time spent: > 35 minutes  Recommendations for Outpatient Follow-up:  1. CT of chest reported 5 mm nodule along the right major fissure within the superior segment of the right lower lobe. Patient will need follow up study in 6 months to evaluate stability/potential interval change.  2. Patient's blood pressure not well controlled initially as such amlodipine dose increased and ace inhibitor started. Please follow up on serum creatinine.  Discharge Diagnoses:  Active Problems:   Chest pain   Discharge Condition: stable  Diet recommendation: Low sodium heart healthy  Filed Weights   08/29/13 2008 08/29/13 2103  Weight: 97.523 kg (215 lb) 107.004 kg (235 lb 14.4 oz)    History of present illness:  Pt in a 63 y/o AAF smoker with history of hypertension and obesity that presented to the ED complaining of Right sided chest discomfort.  Hospital Course:  Chest pain - CT of chest obtained due to elevated D dimer which was negative. LE dupplex negative for DVT's as well - atypical and at this point suspecting musculoskeletal in nature given that it is reproducible on palpation. - recommended rest and tylenol or NSAIDs for discomfort - It is not unreasonable to start patient on a baby aspirin given her risk factors and as such I will start this on discharge. - Will continue protonix at this point as patient's chest discomfort resolved on this medication.  HTN - started on amlodipine 10 mg po daily and ACE Inhibitor - encourage low sodium diet.  Procedures:  As listed below.  Consultations:  None  Discharge Exam: Filed Vitals:   08/30/13 1453  BP: 133/73  Pulse: 70  Temp: 98.1 F (36.7 C)  Resp: 18    General: Pt in NAD, Alert and awake Cardiovascular: RRR, no MRG Respiratory: CTA BL, no  wheezes  Discharge Instructions  Discharge Orders   Future Appointments Provider Department Dept Phone   09/01/2013 9:15 AM Pricilla Larsson, NP [facility] [phone]   Future Orders Complete By Expires   Call MD for:  severe uncontrolled pain  As directed    Call MD for:  temperature >100.4  As directed    Diet - low sodium heart healthy  As directed    Discharge instructions  As directed    Comments:     Please be sure to follow up with your physician in 1-2 weeks or sooner should any new concerns arise.  Also you will need repeat CT scan of your chest in 6 months to assess pulmonary nodule.   Increase activity slowly  As directed        Medication List         amLODipine 10 MG tablet  Commonly known as:  NORVASC  Take 1 tablet (10 mg total) by mouth daily.     aspirin EC 81 MG tablet  Take 1 tablet (81 mg total) by mouth daily.     ferrous sulfate 325 (65 FE) MG tablet  Take 1 tablet (325 mg total) by mouth 3 (three) times daily after meals.     lisinopril 5 MG tablet  Commonly known as:  PRINIVIL,ZESTRIL  Take 1 tablet (5 mg total) by mouth daily.     multivitamin tablet  Take 1 tablet by mouth daily.     pantoprazole 40 MG tablet  Commonly known as:  PROTONIX  Take 1 tablet (40 mg total) by mouth daily.     polyethylene glycol packet  Commonly known as:  MIRALAX / GLYCOLAX  Take 17 g by mouth 2 (two) times daily.       No Known Allergies    The results of significant diagnostics from this hospitalization (including imaging, microbiology, ancillary and laboratory) are listed below for reference.    Significant Diagnostic Studies: Dg Chest 2 View  08/29/2013   CLINICAL DATA:  Right-sided anterior chest pain for 1 day  EXAM: CHEST  2 VIEW  COMPARISON:  04/28/2013  FINDINGS: Mild to moderate cardiac enlargement stable. Mild vascular congestion stable. No consolidation, effusion, or pulmonary edema.  IMPRESSION: Similar to the prior study with cardiac  enlargement and vascular congestion but no evidence of pulmonary edema.   Electronically Signed   By: Skipper Cliche M.D.   On: 08/29/2013 16:05   Ct Angio Chest Pe W/cm &/or Wo Cm  08/29/2013   CLINICAL DATA:  Right-sided chest pain  EXAM: CT ANGIOGRAPHY CHEST WITH CONTRAST  TECHNIQUE: Multidetector CT imaging of the chest was performed using the standard protocol during bolus administration of intravenous contrast. Multiplanar CT image reconstructions including MIPs were obtained to evaluate the vascular anatomy.  CONTRAST:  126mL OMNIPAQUE IOHEXOL 350 MG/ML SOLN  COMPARISON:  Prior radiograph performed earlier on the same day  FINDINGS: No pathologically enlarged mediastinal, hilar, or axillary lymph nodes are identified.  Aorta great vessels are within normal limits. Calcified plaque noted within the aortic arch and at the origin of the great vessels.  There is mild cardiomegaly with diffuse 3 vessel coronary artery calcifications. A small pericardial effusion is noted.  Pulmonary arterial tree is well opacified. No filling defects are seen to suggest acute pulmonary embolism. Re-formatted imaging confirms these findings.  The lungs are clear without focal infiltrate or pulmonary edema. No pleural effusion. Mild subsegmental atelectasis seen dependently within both lower lobes. A 5 mm nodule is seen along the right major fissure (series 12, image 41). No other pulmonary nodule or mass.  Visualized portions of the upper abdomen are unremarkable.  No acute osseous abnormality. No focal lytic or blastic osseous lesions.  IMPRESSION: 1. No CT evidence of acute pulmonary embolism. 2. Small pericardial effusion. 3. 5 mm nodule along the right major fissure within the superior segment of the right lower lobe. If the patient has underlying risk factors (i.e. smoking), a follow-up study in 6 months is recommended to evaluate stability/ potential interval change. If there are no underlying risk factors, a follow-up  study in 12 months is recommended to ensure stability. 4. Mild cardiomegaly with diffuse 3 vessel coronary artery calcifications.   Electronically Signed   By: Jeannine Boga M.D.   On: 08/29/2013 17:29    Microbiology: No results found for this or any previous visit (from the past 240 hour(s)).   Labs: Basic Metabolic Panel:  Recent Labs Lab 08/29/13 1525 08/29/13 1535 08/30/13 0226  NA 141 144 140  K 4.3 4.1 4.1  CL 106 107 105  CO2 26  --  25  GLUCOSE 105* 103* 104*  BUN 16 16 17   CREATININE 0.81 0.90 0.80  CALCIUM 8.9  --  8.6   Liver Function Tests:  Recent Labs Lab 08/30/13 0226  AST 19  ALT 17  ALKPHOS 141*  BILITOT <0.2*  PROT 7.2  ALBUMIN 3.0*   No results found for this basename: LIPASE, AMYLASE,  in the last 168 hours No results found  for this basename: AMMONIA,  in the last 168 hours CBC:  Recent Labs Lab 08/29/13 1525 08/29/13 1535 08/30/13 0226  WBC 5.6  --  7.3  HGB 11.4* 12.9 11.3*  HCT 36.1 38.0 35.7*  MCV 83.8  --  84.2  PLT 303  --  292   Cardiac Enzymes:  Recent Labs Lab 08/29/13 2000 08/30/13 0226 08/30/13 0850  TROPONINI <0.30 <0.30 <0.30   BNP: BNP (last 3 results) No results found for this basename: PROBNP,  in the last 8760 hours CBG: No results found for this basename: GLUCAP,  in the last 168 hours     Signed:  Velvet Bathe  [facility] 08/30/2013, 3:15 PM

## 2013-08-30 NOTE — Progress Notes (Signed)
VASCULAR LAB PRELIMINARY  PRELIMINARY  PRELIMINARY  PRELIMINARY  Bilateral lower extremity venous duplex completed.    Preliminary report:  Bilateral:  No evidence of DVT, superficial thrombosis, or Baker's Cyst.   Tiffany [name], RVS 08/30/2013, 9:50 AM

## 2013-08-30 NOTE — Progress Notes (Signed)
UR completed 

## 2013-09-01 ENCOUNTER — Encounter: Payer: Self-pay | Admitting: Nurse Practitioner

## 2013-09-01 ENCOUNTER — Ambulatory Visit (INDEPENDENT_AMBULATORY_CARE_PROVIDER_SITE_OTHER): Payer: Medicare Other | Admitting: Nurse Practitioner

## 2013-09-01 VITALS — BP 132/82 | HR 67 | Temp 98.7°F | Resp 12 | Ht 60.0 in | Wt 235.0 lb

## 2013-09-01 DIAGNOSIS — Z96659 Presence of unspecified artificial knee joint: Secondary | ICD-10-CM | POA: Diagnosis not present

## 2013-09-01 DIAGNOSIS — I10 Essential (primary) hypertension: Secondary | ICD-10-CM

## 2013-09-01 DIAGNOSIS — Z96652 Presence of left artificial knee joint: Secondary | ICD-10-CM

## 2013-09-01 DIAGNOSIS — D500 Iron deficiency anemia secondary to blood loss (chronic): Secondary | ICD-10-CM | POA: Diagnosis not present

## 2013-09-01 DIAGNOSIS — K219 Gastro-esophageal reflux disease without esophagitis: Secondary | ICD-10-CM | POA: Diagnosis not present

## 2013-09-01 DIAGNOSIS — Z720 Tobacco use: Secondary | ICD-10-CM | POA: Insufficient documentation

## 2013-09-01 DIAGNOSIS — F17200 Nicotine dependence, unspecified, uncomplicated: Secondary | ICD-10-CM

## 2013-09-01 NOTE — Progress Notes (Signed)
Patient ID: Tiffany [name], female   DOB: November 20, 1950, 63 y.o.   MRN: [id number]    No Known Allergies  Chief Complaint  Patient presents with  . Establish Care    New Patient , establish care. Hospital follow-up     HPI: Patient is a 63 y.o. female seen in the office today to establish care; pt did not have primary care prior to having total knee; in the past she has been seen in the ED and [place] [facility] over 1 year ago. Recently had a left total knee by Dr Alvan Dame and plans to have right total knee- has finished therapy and is doing at home exercises  He is prescribing her pain medication- reports some pain still from surgery but following up with Alvan Dame  Currently living with family members to help her at home.  Currently reports she is doing well; unsure if she wants to cut back on smoking at this time but has played with the idea of cutting back.  No complaints for today's visit  Review of Systems:  Review of Systems  Constitutional: Negative for fever, chills, weight loss and malaise/fatigue.  HENT: Positive for hearing loss (would like to be checked for hearing--gradual hearing loss). Negative for congestion, ear pain, sore throat and tinnitus.   Eyes: Positive for blurred vision (chronic in right eye- has not seen eye doctor but reports she needs to and plans to make appt).  Respiratory: Negative for cough, sputum production and shortness of breath.   Cardiovascular: Positive for chest pain (went to ED; unremarkable work up; started on protonix could be GERD related, PAIN HAS IMPROVED, none recently). Negative for palpitations and leg swelling.  Gastrointestinal: Positive for heartburn. Negative for abdominal pain, diarrhea and constipation.  Genitourinary: Negative for dysuria, urgency and frequency.  Musculoskeletal: Positive for joint pain (pain in needs).  Skin: Negative for itching and rash.  Neurological: Negative for dizziness, tingling, focal weakness, weakness and  headaches (occasionally in the past; none recent).  Psychiatric/Behavioral: Negative for depression. The patient is not nervous/anxious and does not have insomnia.      Past Medical History  Diagnosis Date  . Arthritis     osteoarthritis  . Hx of bronchitis   . Hypertension   . GERD (gastroesophageal reflux disease)    Past Surgical History  Procedure Laterality Date  . Knee surgery      arthroscopic right  . Toe nail removal    . 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  She has a 5 pack-year smoking history. She has never used smokeless tobacco. She reports that she does not drink alcohol or use illicit drugs.  Family History  Problem Relation Age of Onset  . Heart disease Father     Died of CHF age 7  . Heart disease Mother     Died of CHF age 81  . Cancer Brother     Cancer  . Heart failure Brother     Died of CHF   . High blood pressure Son     Medications: Patient's Medications  New Prescriptions   No medications on file  Previous Medications   AMLODIPINE (NORVASC) 10 MG TABLET    Take 1 tablet (10 mg total) by mouth daily.   ASPIRIN 325 MG EC  TABLET    Take 325 mg by mouth daily.   LISINOPRIL (PRINIVIL,ZESTRIL) 5 MG TABLET    Take 1 tablet (5 mg total) by mouth daily.   METHOCARBAMOL (ROBAXIN) 500 MG TABLET    Take 500 mg by mouth every 6 (six) hours as needed for muscle spasms (For Knee Pain/Spasms).   OXYCODONE (OXY IR/ROXICODONE) 5 MG IMMEDIATE RELEASE TABLET    Take 5 mg by mouth every 8 (eight) hours as needed for severe pain (Knee Pain, Rx'ed by Dr.Olin).   PANTOPRAZOLE (PROTONIX) 40 MG TABLET    Take 1 tablet (40 mg total) by mouth daily.  Modified Medications   No medications on file  Discontinued Medications   ASPIRIN EC 81 MG TABLET    Take 1 tablet (81  mg total) by mouth daily.   FERROUS SULFATE 325 (65 FE) MG TABLET    Take 1 tablet (325 mg total) by mouth 3 (three) times daily after meals.   MULTIPLE VITAMIN (MULTIVITAMIN) TABLET    Take 1 tablet by mouth daily.   POLYETHYLENE GLYCOL (MIRALAX / GLYCOLAX) PACKET    Take 17 g by mouth 2 (two) times daily.     Physical Exam:  Filed Vitals:   09/01/13 0843  BP: 132/82  Pulse: 67  Temp: 98.7 F (37.1 C)  TempSrc: Oral  Resp: 12  Height: 5' (1.524 m)  Weight: 235 lb (106.595 kg)  SpO2: 98%    Physical Exam  Constitutional: She is oriented to person, place, and time and well-developed, well-nourished, and in no distress.  HENT:  Head: Normocephalic and atraumatic.  Right Ear: External ear normal.  Left Ear: External ear normal.  Nose: Nose normal.  Mouth/Throat: Oropharynx is clear and moist. No oropharyngeal exudate.  Poor dentition  Eyes: Conjunctivae and EOM are normal. Pupils are equal, round, and reactive to light.  Neck: Normal range of motion. Neck supple.  Cardiovascular: Normal rate, regular rhythm and normal heart sounds.   Pulmonary/Chest: Effort normal and breath sounds normal. No respiratory distress. She has no wheezes.  Abdominal: Soft. Bowel sounds are normal. She exhibits no distension.  Musculoskeletal: Normal range of motion. She exhibits no edema.  Neurological: She is alert and oriented to person, place, and time. Gait normal.  Skin: Skin is warm and dry. She is not diaphoretic.     Labs reviewed: Basic Metabolic Panel:  Recent Labs  06/02/13 0428 08/29/13 1525 08/29/13 1535 08/30/13 0226 08/30/13 0850  NA 137 141 144 140  --   K 4.1 4.3 4.1 4.1  --   CL 102 106 107 105  --   CO2 26 26  --  25  --   GLUCOSE 128* 105* 103* 104*  --   BUN 16 16 16 17   --   CREATININE 0.83 0.81 0.90 0.80  --   CALCIUM 8.9 8.9  --  8.6  --   TSH  --   --   --   --  1.479   Liver Function Tests:  Recent Labs  08/30/13 0226  AST 19  ALT 17  ALKPHOS 141*   BILITOT <0.2*  PROT 7.2  ALBUMIN 3.0*   No results found for this basename: LIPASE, AMYLASE,  in the last 8760 hours No results found for this basename: AMMONIA,  in the last 8760 hours CBC:  Recent Labs  11/23/12 0440  06/02/13 0428 08/29/13 1525 08/29/13 1535 08/30/13 0226  WBC 7.2  < > 13.6* 5.6  --  7.3  NEUTROABS 5.2  --   --   --   --   --   HGB 11.0*  < > 10.7* 11.4* 12.9 11.3*  HCT 35.1*  < > 33.2* 36.1 38.0 35.7*  MCV 84.2  < > 84.1 83.8  --  84.2  PLT 248  < > 297 303  --  292  < > = values in this interval not displayed. Lipid Panel:  Recent Labs  08/29/13 2000  CHOL 147  HDL 43  LDLCALC 81  TRIG 117  CHOLHDL 3.4   TSH:  Recent Labs  08/30/13 0850  TSH 1.479     Assessment/Plan 1. Hypertension -Patient is stable; continue current regimen. Will monitor and make changes as necessary.  2. S/P knee replacement, left -conts to follow up with Alvan Dame, plan to have total right knee  3. GERD (gastroesophageal reflux disease) -recent trip to the ED which was unremarkable on work up for chest pain and discomfort; pt was placed on protonix which she feels like is helping symptoms   4. Expected blood loss anemia -improved on recent cbc from hospital  5. Tobacco abuse -Wait at least 30 mins before you have a cigarette when you are having a craving -Plan is to  consistency cut out 1-2 Cigarette(s) a day before your next visit   6. Preventive  Increase cardiovascular activity to 30 mins 5 days a week  Make appt with eye doctor and dentist Make next appt for a complete physical - will get PAP at that time

## 2013-09-01 NOTE — Patient Instructions (Signed)
Wait at least 30 mins before you have a cigarette when you are having a craving Lets consistency cut out -2  Cigarette(s) a day before your next visit   Increase cardiovascular activity to 30 mins 5 days a week   Make appt with eye doctor and dentist  Make next appt for a complete physical

## 2013-11-03 ENCOUNTER — Encounter: Payer: Self-pay | Admitting: Nurse Practitioner

## 2013-11-03 ENCOUNTER — Ambulatory Visit (INDEPENDENT_AMBULATORY_CARE_PROVIDER_SITE_OTHER): Payer: Medicare Other | Admitting: Nurse Practitioner

## 2013-11-03 ENCOUNTER — Encounter: Payer: Self-pay | Admitting: *Deleted

## 2013-11-03 VITALS — BP 146/88 | HR 74 | Temp 97.3°F | Resp 16 | Ht 60.75 in | Wt 244.4 lb

## 2013-11-03 DIAGNOSIS — Z96659 Presence of unspecified artificial knee joint: Secondary | ICD-10-CM

## 2013-11-03 DIAGNOSIS — D500 Iron deficiency anemia secondary to blood loss (chronic): Secondary | ICD-10-CM | POA: Diagnosis not present

## 2013-11-03 DIAGNOSIS — M179 Osteoarthritis of knee, unspecified: Secondary | ICD-10-CM

## 2013-11-03 DIAGNOSIS — Z139 Encounter for screening, unspecified: Secondary | ICD-10-CM

## 2013-11-03 DIAGNOSIS — Z1211 Encounter for screening for malignant neoplasm of colon: Secondary | ICD-10-CM

## 2013-11-03 DIAGNOSIS — IMO0002 Reserved for concepts with insufficient information to code with codable children: Secondary | ICD-10-CM | POA: Diagnosis not present

## 2013-11-03 DIAGNOSIS — K219 Gastro-esophageal reflux disease without esophagitis: Secondary | ICD-10-CM | POA: Diagnosis not present

## 2013-11-03 DIAGNOSIS — M1710 Unilateral primary osteoarthritis, unspecified knee: Secondary | ICD-10-CM

## 2013-11-03 DIAGNOSIS — Z1239 Encounter for other screening for malignant neoplasm of breast: Secondary | ICD-10-CM

## 2013-11-03 DIAGNOSIS — I10 Essential (primary) hypertension: Secondary | ICD-10-CM | POA: Diagnosis not present

## 2013-11-03 DIAGNOSIS — Z1231 Encounter for screening mammogram for malignant neoplasm of breast: Secondary | ICD-10-CM

## 2013-11-03 MED ORDER — OXYCODONE HCL 5 MG PO TABS: 5.0000 mg | ORAL_TABLET | Freq: Three times a day (TID) | ORAL | Status: DC | PRN

## 2013-11-03 MED ORDER — LISINOPRIL 5 MG PO TABS: 5.0000 mg | ORAL_TABLET | Freq: Every day | ORAL | Status: DC

## 2013-11-03 MED ORDER — TETANUS-DIPHTH-ACELL PERTUSSIS 5-2.5-18.5 LF-MCG/0.5 IM SUSP: 0.5000 mL | Freq: Once | INTRAMUSCULAR | Status: DC

## 2013-11-03 MED ORDER — METHOCARBAMOL 500 MG PO TABS: 500.0000 mg | ORAL_TABLET | Freq: Four times a day (QID) | ORAL | Status: DC | PRN

## 2013-11-03 MED ORDER — AMLODIPINE BESYLATE 10 MG PO TABS: 10.0000 mg | ORAL_TABLET | Freq: Every day | ORAL | Status: DC

## 2013-11-03 MED ORDER — PANTOPRAZOLE SODIUM 40 MG PO TBEC: 40.0000 mg | DELAYED_RELEASE_TABLET | Freq: Every day | ORAL | Status: DC

## 2013-11-03 NOTE — Patient Instructions (Signed)
To get blood work prior to visit--- reschedule complete physical

## 2013-11-03 NOTE — Progress Notes (Signed)
Patient ID: Tiffany [name], female   DOB: Feb 20, 1951, 63 y.o.   MRN: [id number]    No Known Allergies  Chief Complaint  Patient presents with  . Annual Exam    Physical with no labs prior, ? fasting (had a little bit soda)  . Immunizations    needs Pneumo vaccine and Tdap printed.  Marland Kitchen other    last mammogram 2002, ? last pap, never had Colonoscopy    HPI: Patient is a 63 y.o. female seen in the office today for EV; however she request this be postpone due to recent lost in her grandson who was born premature and they are having his funeral and burying him tomorrow; not feeling up to extensive exam today; will get mammogram and colonoscopy orders   Without any complaints today other than grieving for her grandson.  Review of Systems:  Review of Systems  Constitutional: Negative for fever, chills, weight loss and malaise/fatigue.  HENT: Negative for congestion, ear pain, sore throat and tinnitus.   Respiratory: Negative for cough, sputum production and shortness of breath.   Cardiovascular: Negative for chest pain, palpitations and leg swelling.  Gastrointestinal: Negative for abdominal pain, diarrhea and constipation.  Genitourinary: Negative for dysuria, urgency and frequency.  Musculoskeletal: Positive for joint pain (pain in knees).  Skin: Negative for itching and rash.  Neurological: Negative for dizziness, tingling, focal weakness and weakness. Headaches: occasionally in the past; none recent.  Psychiatric/Behavioral: Negative for depression. The patient is not nervous/anxious and does not have insomnia.      Past Medical History  Diagnosis Date  . Arthritis     osteoarthritis  . Hx of bronchitis   . Hypertension   . GERD (gastroesophageal reflux disease)    Past Surgical History  Procedure Laterality Date  . Knee surgery      arthroscopic right  . Toe nail removal    . Total knee arthroplasty Left 05/31/2013    Procedure: LEFT TOTAL KNEE ARTHROPLASTY;  Surgeon:  Mauri Pole, MD;  Location: [facility];  Service: Orthopedics;  Laterality: Left;   Social History:   reports that she has been smoking Cigarettes.  She has a 5 pack-year smoking history. She has never used smokeless tobacco. She reports that she does not drink alcohol or use illicit drugs.  Family History  Problem Relation Age of Onset  . Heart disease Father     Died of CHF age 58  . Heart disease Mother     Died of CHF age 3  . Cancer Brother     Cancer  . Heart failure Brother     Died of CHF   . High blood pressure Son     Medications: Patient's Medications  New Prescriptions   No medications on file  Previous Medications   AMLODIPINE (NORVASC) 10 MG TABLET    Take 1 tablet (10 mg total) by mouth daily.   ASPIRIN 325 MG EC TABLET    Take 325 mg by mouth daily.   LISINOPRIL (PRINIVIL,ZESTRIL) 5 MG TABLET    Take 1 tablet (5 mg total) by mouth daily.   METHOCARBAMOL (ROBAXIN) 500 MG TABLET    Take 500 mg by mouth every 6 (six) hours as needed for muscle spasms (For Knee Pain/Spasms).   OXYCODONE (OXY IR/ROXICODONE) 5 MG IMMEDIATE RELEASE TABLET    Take 5 mg by mouth every 8 (eight) hours as needed for severe pain (Knee Pain, Rx'ed by Dr.Olin).   PANTOPRAZOLE (PROTONIX) 40 MG TABLET  Take 1 tablet (40 mg total) by mouth daily.   TDAP (BOOSTRIX) 5-2.5-18.5 LF-MCG/0.5 INJECTION    Inject 0.5 mLs into the muscle once.  Modified Medications   No medications on file  Discontinued Medications   No medications on file     Physical Exam:  Filed Vitals:   11/03/13 1129  BP: 146/88  Pulse: 74  Temp: 97.3 F (36.3 C)  TempSrc: Oral  Resp: 16  Height: 5' 0.75" (1.543 m)  Weight: 244 lb 6.4 oz (110.859 kg)  SpO2: 95%    Physical Exam  Constitutional: She is oriented to person, place, and time and well-developed, well-nourished, and in no distress.  HENT:  Head: Normocephalic and atraumatic.  Right Ear: External ear normal.  Left Ear: External ear normal.  Nose: Nose  normal.  Mouth/Throat: Oropharynx is clear and moist. No oropharyngeal exudate.  Poor dentition  Eyes: Conjunctivae and EOM are normal. Pupils are equal, round, and reactive to light.  Neck: Normal range of motion. Neck supple.  Cardiovascular: Normal rate, regular rhythm and normal heart sounds.   Pulmonary/Chest: Effort normal and breath sounds normal. No respiratory distress. She has no wheezes.  Abdominal: Soft. Bowel sounds are normal. She exhibits no distension.  Musculoskeletal: Normal range of motion. She exhibits no edema.  Neurological: She is alert and oriented to person, place, and time. Gait normal.  Skin: Skin is warm and dry. She is not diaphoretic.  Psychiatric: Affect normal.  Tearful during exam      Labs reviewed: Basic Metabolic Panel:  Recent Labs  06/02/13 0428 08/29/13 1525 08/29/13 1535 08/30/13 0226 08/30/13 0850  NA 137 141 144 140  --   K 4.1 4.3 4.1 4.1  --   CL 102 106 107 105  --   CO2 26 26  --  25  --   GLUCOSE 128* 105* 103* 104*  --   BUN 16 16 16 17   --   CREATININE 0.83 0.81 0.90 0.80  --   CALCIUM 8.9 8.9  --  8.6  --   TSH  --   --   --   --  1.479   Liver Function Tests:  Recent Labs  08/30/13 0226  AST 19  ALT 17  ALKPHOS 141*  BILITOT <0.2*  PROT 7.2  ALBUMIN 3.0*   No results found for this basename: LIPASE, AMYLASE,  in the last 8760 hours No results found for this basename: AMMONIA,  in the last 8760 hours CBC:  Recent Labs  11/23/12 0440  06/02/13 0428 08/29/13 1525 08/29/13 1535 08/30/13 0226  WBC 7.2  < > 13.6* 5.6  --  7.3  NEUTROABS 5.2  --   --   --   --   --   HGB 11.0*  < > 10.7* 11.4* 12.9 11.3*  HCT 35.1*  < > 33.2* 36.1 38.0 35.7*  MCV 84.2  < > 84.1 83.8  --  84.2  PLT 248  < > 297 303  --  292  < > = values in this interval not displayed. Lipid Panel:  Recent Labs  08/29/13 2000  CHOL 147  HDL 43  LDLCALC 81  TRIG 117  CHOLHDL 3.4   TSH:  Recent Labs  08/30/13 0850  TSH 1.479      Assessment/Plan 1. Hypertension -stable on current medications, will provide refills and pt will get fasting blood work before next visit  - amLODipine (NORVASC) 10 MG tablet; Take 1 tablet (10 mg total)  by mouth daily.  Dispense: 30 tablet; Refill: 0 - lisinopril (PRINIVIL,ZESTRIL) 5 MG tablet; Take 1 tablet (5 mg total) by mouth daily.  Dispense: 30 tablet; Refill: 0 - Comprehensive metabolic panel; Future - Lipid panel; Future  2. GERD (gastroesophageal reflux disease) -stable on medications, refill provided  - pantoprazole (PROTONIX) 40 MG tablet; Take 1 tablet (40 mg total) by mouth daily.  Dispense: 30 tablet; Refill: 0  3. S/P left TKA -still with ongoing pain but overall improved - methocarbamol (ROBAXIN) 500 MG tablet; Take 1 tablet (500 mg total) by mouth every 6 (six) hours as needed for muscle spasms (For Knee Pain/Spasms).  Dispense: 120 tablet; Refill: 0 - oxyCODONE (OXY IR/ROXICODONE) 5 MG immediate release tablet; Take 1 tablet (5 mg total) by mouth every 8 (eight) hours as needed for severe pain.  Dispense: 90 tablet; Refill: 0 - Drug Screen, Urine  4. Expected blood loss anemia -anemic on last labs; will get follow up labs before next visit - CBC With differential/Platelet; Future  5. Osteoarthritis, knee -will get Rx for oxycodone from our practice now; contact signed and pt understands.  -pt reports ongoing pain in right knee and plans for right TKA - Drug Screen, Urine    Follow up in 3-4 weeks for reschedule EV with fasting blood work before visit, will get PAP at this visit

## 2013-11-04 LAB — DRUG SCREEN, URINE
AMPHETAMINES, URINE: NEGATIVE ng/mL
BARBITURATES: NEGATIVE ng/mL
Benzodiazepines: NEGATIVE ng/mL
CANNABINOID: NEGATIVE ng/mL
Cocaine Metabolite: NEGATIVE ng/mL
Opiates: POSITIVE ng/mL
Phencyclidine: NEGATIVE ng/mL

## 2013-11-09 ENCOUNTER — Encounter: Payer: Self-pay | Admitting: Gastroenterology

## 2013-11-11 ENCOUNTER — Other Ambulatory Visit: Payer: Self-pay | Admitting: Nurse Practitioner

## 2013-11-11 DIAGNOSIS — Z1231 Encounter for screening mammogram for malignant neoplasm of breast: Secondary | ICD-10-CM

## 2013-11-25 ENCOUNTER — Ambulatory Visit: Payer: Medicare Other

## 2013-11-28 ENCOUNTER — Other Ambulatory Visit: Payer: Medicare Other

## 2013-11-29 ENCOUNTER — Emergency Department (HOSPITAL_COMMUNITY)
Admission: EM | Admit: 2013-11-29 | Discharge: 2013-11-29 | Disposition: A | Discharge status: Home / Self Care | Payer: Medicare Other | Attending: Emergency Medicine | Admitting: Emergency Medicine

## 2013-11-29 ENCOUNTER — Encounter (HOSPITAL_COMMUNITY): Payer: Self-pay | Admitting: Emergency Medicine

## 2013-11-29 DIAGNOSIS — M7989 Other specified soft tissue disorders: Secondary | ICD-10-CM | POA: Diagnosis not present

## 2013-11-29 DIAGNOSIS — M1990 Unspecified osteoarthritis, unspecified site: Secondary | ICD-10-CM | POA: Insufficient documentation

## 2013-11-29 DIAGNOSIS — F17200 Nicotine dependence, unspecified, uncomplicated: Secondary | ICD-10-CM | POA: Diagnosis not present

## 2013-11-29 DIAGNOSIS — Z9889 Other specified postprocedural states: Secondary | ICD-10-CM | POA: Insufficient documentation

## 2013-11-29 DIAGNOSIS — Z8719 Personal history of other diseases of the digestive system: Secondary | ICD-10-CM | POA: Insufficient documentation

## 2013-11-29 DIAGNOSIS — I10 Essential (primary) hypertension: Secondary | ICD-10-CM | POA: Insufficient documentation

## 2013-11-29 DIAGNOSIS — M130 Polyarthritis, unspecified: Secondary | ICD-10-CM | POA: Diagnosis not present

## 2013-11-29 DIAGNOSIS — M2550 Pain in unspecified joint: Secondary | ICD-10-CM | POA: Diagnosis not present

## 2013-11-29 DIAGNOSIS — Z7982 Long term (current) use of aspirin: Secondary | ICD-10-CM | POA: Diagnosis not present

## 2013-11-29 DIAGNOSIS — Z79899 Other long term (current) drug therapy: Secondary | ICD-10-CM | POA: Insufficient documentation

## 2013-11-29 DIAGNOSIS — Z8709 Personal history of other diseases of the respiratory system: Secondary | ICD-10-CM | POA: Insufficient documentation

## 2013-11-29 MED ORDER — OXYCODONE HCL 5 MG PO TABS
5.0000 mg | ORAL_TABLET | Freq: Once | ORAL | Status: AC
  Administered 2013-11-29: 5 mg via ORAL
  Filled 2013-11-29: qty 1

## 2013-11-29 MED ORDER — OXYCODONE HCL 5 MG PO TABS: 5.0000 mg | ORAL_TABLET | ORAL | Status: DC | PRN

## 2013-11-29 MED ORDER — PREDNISONE 20 MG PO TABS: ORAL_TABLET | ORAL | Status: DC

## 2013-11-29 MED ORDER — PREDNISONE 20 MG PO TABS
60.0000 mg | ORAL_TABLET | Freq: Once | ORAL | Status: AC
  Administered 2013-11-29: 60 mg via ORAL
  Filled 2013-11-29: qty 3

## 2013-11-29 NOTE — ED Provider Notes (Signed)
CSN: [id number]     Arrival date & time 11/29/13  1533 History  This chart was scribed for Tiffany Lemming, PA working with [name], MD by [name], ED Scribe. This patient was seen in room WTR6/WTR6 and the patient's care was started at 4:54 PM.  Chief Complaint  Patient presents with  . Arm Pain  . Leg Pain    The history is provided by the patient. No language interpreter was used.    HPI Comments: Tiffany [name] is a 63 y.o. female with h/o osteoarthritis, HTN who presents to the Emergency Department complaining of bilateral arm pain that began 2 days ago.  Pt states she initially developed pain in her left arm radiating from her left shoulder into her wrist, with some associated swelling to her left wrist.  She has since developed similar pain to her right arm.  She also notes some pain swelling to her right ankle.  Pt admits to previous h/o joint pain but not as severe as at present.  She denies h/o RA or gout.  She denies fevers, nausea, or vomiting.  She denies recent tick bites or injuries.  She denies h/o DM.  She states her BP has been generally well-controlled recently.  Review of  Controlled Substance Database reveals that pt had 90 oxycodone tablets prescribed on 11/03/13.  She states she was taking these but lost them 2 weeks ago.   Past Medical History  Diagnosis Date  . Arthritis     osteoarthritis  . Hx of bronchitis   . Hypertension   . GERD (gastroesophageal reflux disease)     Past Surgical History  Procedure Laterality Date  . Knee surgery      arthroscopic right  . Toe nail removal    . Total knee arthroplasty Left 05/31/2013    Procedure: LEFT TOTAL KNEE ARTHROPLASTY;  Surgeon: Mauri Pole, MD;  Location: [facility];  Service: Orthopedics;  Laterality: Left;    Family History  Problem Relation Age of Onset  . Heart disease Father     Died of CHF age 65  . Heart disease Mother     Died of CHF age 7  . Cancer Brother     Cancer  . Heart  failure Brother     Died of CHF   . High blood pressure Son     History  Substance Use Topics  . Smoking status: Current Every Day Smoker -- 0.25 packs/day for 20 years    Types: Cigarettes  . Smokeless tobacco: Never Used  . Alcohol Use: No    OB History   Grav Para Term Preterm Abortions TAB SAB Ect Mult Living                   Review of Systems  Constitutional: Negative for fever and chills.  HENT: Negative for rhinorrhea and sore throat.   Eyes: Negative for redness.  Respiratory: Negative for cough.   Cardiovascular: Negative for chest pain.  Gastrointestinal: Negative for nausea, vomiting, abdominal pain and diarrhea.  Genitourinary: Negative for dysuria.  Musculoskeletal: Positive for arthralgias and joint swelling. Negative for gait problem, myalgias and neck pain.  Skin: Negative for rash.  Neurological: Negative for headaches.      Allergies  Review of patient's allergies indicates no known allergies.  Home Medications   Current Outpatient Rx  Name  Route  Sig  Dispense  Refill  . amLODipine (NORVASC) 10 MG tablet   Oral   Take  1 tablet (10 mg total) by mouth daily.   30 tablet   0   . aspirin 325 MG EC tablet   Oral   Take 325 mg by mouth daily.         Marland Kitchen lisinopril (PRINIVIL,ZESTRIL) 5 MG tablet   Oral   Take 1 tablet (5 mg total) by mouth daily.   30 tablet   0   . methocarbamol (ROBAXIN) 500 MG tablet   Oral   Take 1 tablet (500 mg total) by mouth every 6 (six) hours as needed for muscle spasms (For Knee Pain/Spasms).   120 tablet   0   . oxyCODONE (OXY IR/ROXICODONE) 5 MG immediate release tablet   Oral   Take 1 tablet (5 mg total) by mouth every 8 (eight) hours as needed for severe pain.   90 tablet   0   . Tdap (BOOSTRIX) 5-2.5-18.5 LF-MCG/0.5 injection   Intramuscular   Inject 0.5 mLs into the muscle once.   0.5 mL   0    BP 166/99  Pulse 86  Temp(Src) 98.9 F (37.2 C) (Oral)  Resp 16  SpO2 100%  Physical Exam   Nursing note and vitals reviewed. Constitutional: She appears well-developed and well-nourished. No distress.  HENT:  Head: Normocephalic and atraumatic.  Eyes: EOM are normal. Pupils are equal, round, and reactive to light.  Neck: Normal range of motion. Neck supple. No tracheal deviation present.  Cardiovascular: Normal rate.  Exam reveals no decreased pulses.   Pulmonary/Chest: Effort normal. No respiratory distress.  Musculoskeletal: She exhibits tenderness. She exhibits no edema.       Right shoulder: Normal.       Left shoulder: She exhibits tenderness. She exhibits normal range of motion and no bony tenderness.       Right elbow: Normal.      Left elbow: Normal.       Right wrist: She exhibits tenderness. She exhibits normal range of motion and no bony tenderness.       Left wrist: She exhibits tenderness and bony tenderness. She exhibits normal range of motion.       Right hip: Normal.       Left hip: Normal.       Right knee: Normal.       Left knee: Normal.       Right ankle: She exhibits swelling (generalized). She exhibits normal range of motion. Tenderness. No lateral malleolus and no medial malleolus tenderness found.       Left ankle: Normal.       Cervical back: Normal.       Left upper arm: Normal.       Left forearm: Normal.       Right hand: Normal.       Left hand: She exhibits tenderness. Normal sensation noted. Normal strength noted.       Right lower leg: She exhibits swelling (trace to knee).       Right foot: She exhibits swelling. She exhibits normal range of motion and no tenderness.       Left foot: Normal.  Neurological: She is alert. No sensory deficit.  Motor, sensation, and vascular distal to the injury is fully intact.   Skin: Skin is warm and dry.  Psychiatric: She has a normal mood and affect. Her behavior is normal.    ED Course  Procedures (including critical care time)  DIAGNOSTIC STUDIES: Oxygen Saturation is 100% on room air, normal by  my interpretation.  COORDINATION OF CARE: 5:00 PM-Discussed treatment plan with pt at bedside and pt agreed to plan.     Labs Review Labs Reviewed - No data to display  Imaging Review No results found.   EKG Interpretation None      5:07 PM Patient seen and examined.    Vital signs reviewed and are as follows: Filed Vitals:   11/29/13 1618  BP: 166/99  Pulse: 86  Temp: 98.9 F (37.2 C)  Resp: 16   Will tx with pain medication, steroids. Patient encouraged to followup with her primary care physician in the next week.  Patient to return to the emergency department with fever, worsening swelling or pain.  Patient counseled on use of narcotic pain medications. Counseled not to combine these medications with others containing tylenol. Urged not to drink alcohol, drive, or perform any other activities that requires focus while taking these medications. The patient verbalizes understanding and agrees with the plan.  MDM   Final diagnoses:  Polyarthralgia   Patient with polyarthralgia joint swelling. No tick bites. No systemic symptoms such as fever. Patient has range of motion of joints. No history of diabetes. Do not suspect any septic arthritis at this time. Symptoms are not consistent with gout. No large effusions. No indication for further workup here, however patient will likely need PCP followup to ensure resolution and to identify cause. Return instructions discussed with patient and she agrees.   I personally performed the services described in this documentation, which was scribed in my presence. The recorded information has been reviewed and is accurate.    Carlisle Cater, PA-C 11/29/13 1709

## 2013-11-29 NOTE — Discharge Instructions (Signed)
Please read and follow all provided instructions.  Your diagnoses today include:  1. Polyarthralgia    Tests performed today include:  Vital signs. See below for your results today.   Medications prescribed:   Oxycodone - narcotic pain medication  DO NOT drive or perform any activities that require you to be awake and alert because this medicine can make you drowsy.    Prednisone - steroid medicine   It is best to take this medication in the morning to prevent sleeping problems. If you are diabetic, monitor your blood sugar closely and stop taking Prednisone if blood sugar is over 300. Take with food to prevent stomach upset.   Take any prescribed medications only as directed.  Home care instructions:  Follow any educational materials contained in this packet.  BE VERY CAREFUL not to take multiple medicines containing Tylenol (also called acetaminophen). Doing so can lead to an overdose which can damage your liver and cause liver failure and possibly death.   Follow-up instructions: Please follow-up with your primary care provider in the next 3 days for further evaluation of your symptoms. If you do not have a primary care doctor -- see below for referral information.   Return instructions:   Please return to the Emergency Department if you experience worsening symptoms.   Please return if you have any other emergent concerns.  Additional Information:  Your vital signs today were: BP 166/99   Pulse 86   Temp(Src) 98.9 F (37.2 C) (Oral)   Resp 16   SpO2 100% If your blood pressure (BP) was elevated above 135/85 this visit, please have this repeated by your doctor within one month. --------------

## 2013-11-29 NOTE — ED Notes (Signed)
Pt states that she has been having bilateral shoulder/arm/wrist pain x 2 days.  Denies injury.  Some swelling noted.  Also c/o rt ankle swelling/pain.

## 2013-11-30 ENCOUNTER — Encounter: Payer: Medicare Other | Admitting: Nurse Practitioner

## 2013-12-02 ENCOUNTER — Ambulatory Visit (AMBULATORY_SURGERY_CENTER): Payer: Self-pay | Admitting: *Deleted

## 2013-12-02 VITALS — Ht 60.0 in | Wt 247.6 lb

## 2013-12-02 DIAGNOSIS — Z1211 Encounter for screening for malignant neoplasm of colon: Secondary | ICD-10-CM

## 2013-12-02 MED ORDER — MOVIPREP 100 G PO SOLR: 1.0000 | Freq: Once | ORAL | Status: DC

## 2013-12-02 NOTE — ED Provider Notes (Signed)
Medical screening examination/treatment/procedure(s) were performed by non-physician practitioner and as supervising physician I was immediately available for consultation/collaboration.   EKG Interpretation None       [name], MD 12/02/13 1958

## 2013-12-02 NOTE — Progress Notes (Signed)
No egg or soy allergy. No anesthesia problems.  

## 2013-12-08 ENCOUNTER — Encounter: Payer: Medicare Other | Admitting: Nurse Practitioner

## 2013-12-13 ENCOUNTER — Encounter: Payer: Self-pay | Admitting: Gastroenterology

## 2013-12-13 ENCOUNTER — Ambulatory Visit (AMBULATORY_SURGERY_CENTER): Payer: Medicare Other | Admitting: Gastroenterology

## 2013-12-13 VITALS — BP 187/111 | HR 61 | Temp 98.6°F | Resp 21 | Ht 60.0 in | Wt 247.0 lb

## 2013-12-13 DIAGNOSIS — D126 Benign neoplasm of colon, unspecified: Secondary | ICD-10-CM

## 2013-12-13 DIAGNOSIS — I10 Essential (primary) hypertension: Secondary | ICD-10-CM | POA: Diagnosis not present

## 2013-12-13 DIAGNOSIS — G8929 Other chronic pain: Secondary | ICD-10-CM | POA: Diagnosis not present

## 2013-12-13 DIAGNOSIS — Z1211 Encounter for screening for malignant neoplasm of colon: Secondary | ICD-10-CM

## 2013-12-13 MED ORDER — SODIUM CHLORIDE 0.9 % IV SOLN: 500.0000 mL | INTRAVENOUS | Status: DC

## 2013-12-13 NOTE — Progress Notes (Signed)
Called to room to assist during endoscopic procedure.  Patient ID and intended procedure confirmed with present staff. Received instructions for my participation in the procedure from the performing physician.  

## 2013-12-13 NOTE — Patient Instructions (Signed)
YOU HAD AN ENDOSCOPIC PROCEDURE TODAY AT THE [facility]: Refer to the procedure report that was given to you for any specific questions about what was found during the examination.  If the procedure report does not answer your questions, please call your gastroenterologist to clarify.  If you requested that your care partner not be given the details of your procedure findings, then the procedure report has been included in a sealed envelope for you to review at your convenience later.  YOU SHOULD EXPECT: Some feelings of bloating in the abdomen. Passage of more gas than usual.  Walking can help get rid of the air that was put into your GI tract during the procedure and reduce the bloating. If you had a lower endoscopy (such as a colonoscopy or flexible sigmoidoscopy) you may notice spotting of blood in your stool or on the toilet paper. If you underwent a bowel prep for your procedure, then you may not have a normal bowel movement for a few days.  DIET: Your first meal following the procedure should be a light meal and then it is ok to progress to your normal diet.  A half-sandwich or bowl of soup is an example of a good first meal.  Heavy or fried foods are harder to digest and may make you feel nauseous or bloated.  Likewise meals heavy in dairy and vegetables can cause extra gas to form and this can also increase the bloating.  Drink plenty of fluids but you should avoid alcoholic beverages for 24 hours.  ACTIVITY: Your care partner should take you home directly after the procedure.  You should plan to take it easy, moving slowly for the rest of the day.  You can resume normal activity the day after the procedure however you should NOT DRIVE or use heavy machinery for 24 hours (because of the sedation medicines used during the test).    SYMPTOMS TO REPORT IMMEDIATELY: A gastroenterologist can be reached at any hour.  During normal business hours, 8:30 AM to 5:00 PM Monday through Friday,  call [phone].  After hours and on weekends, please call the GI answering service at [phone] who will take a message and have the physician on call contact you.   Following lower endoscopy (colonoscopy or flexible sigmoidoscopy):  Excessive amounts of blood in the stool  Significant tenderness or worsening of abdominal pains  Swelling of the abdomen that is new, acute  Fever of 100F or higher  FOLLOW UP: If any biopsies were taken you will be contacted by phone or by letter within the next 1-3 weeks.  Call your gastroenterologist if you have not heard about the biopsies in 3 weeks.  Our staff will call the home number listed on your records the next business day following your procedure to check on you and address any questions or concerns that you may have at that time regarding the information given to you following your procedure. This is a courtesy call and so if there is no answer at the home number and we have not heard from you through the emergency physician on call, we will assume that you have returned to your regular daily activities without incident.  SIGNATURES/CONFIDENTIALITY: You and/or your care partner have signed paperwork which will be entered into your electronic medical record.  These signatures attest to the fact that that the information above on your After Visit Summary has been reviewed and is understood.  Full responsibility of the confidentiality of this   discharge information lies with you and/or your care-partner.  Recommendations Next colonoscopy determined by pathology results, 3 or 10 years.

## 2013-12-13 NOTE — Op Note (Signed)
[facility]  [address] [place], [address]   COLONOSCOPY PROCEDURE REPORT  PATIENT: Tiffany [name], Tiffany [name]  MR#: [id number] BIRTHDATE: 1951/04/20 , 41  yrs. old GENDER: Female ENDOSCOPIST: Milus Banister, MD REFERRED BY: Hassell Done, MD PROCEDURE DATE:  12/13/2013 PROCEDURE:   Colonoscopy with snare polypectomy First Screening Colonoscopy - Avg.  risk and is 50 yrs.  old or older Yes.  Prior Negative Screening - Now for repeat screening. N/A  History of Adenoma - Now for follow-up colonoscopy & has been > or = to 3 yrs.  N/A  Polyps Removed Today? Yes. ASA CLASS:   Class II INDICATIONS:average risk screening. MEDICATIONS: MAC sedation, administered by CRNA and propofol (Diprivan) 200mg  IV  DESCRIPTION OF PROCEDURE:   After the risks benefits and alternatives of the procedure were thoroughly explained, informed consent was obtained.  A digital rectal exam revealed no abnormalities of the rectum.   The LB [id number]  endoscope was introduced through the anus and advanced to the cecum, which was identified by both the appendix and ileocecal valve. No adverse events experienced.   The quality of the prep was good.  The instrument was then slowly withdrawn as the colon was fully examined.   COLON FINDINGS: Two polyps were found, removed and sent to pathology.  One was sessile, located in descending, 1.2cm across, removed with snare/cautery (path jar 1).  The other was also sessile, located in descending segment, 63mm across, removed with cold snare (path jar 2).  The examination was otherwise normal. Retroflexed views revealed no abnormalities. The time to cecum=1 minutes 20 seconds.  Withdrawal time=10 minutes 17 seconds.  The scope was withdrawn and the procedure completed. COMPLICATIONS: There were no complications.  ENDOSCOPIC IMPRESSION: Two polyps were found, removed and sent to pathology. The examination was otherwise  normal.  RECOMMENDATIONS: If the polyp(s) removed today are proven to be adenomatous (pre-cancerous) polyps, you will need a colonoscopy in 3 years. Otherwise you should continue to follow colorectal cancer screening guidelines for "routine risk" patients with a colonoscopy in 10 years.  You will receive a letter within 1-2 weeks with the results of your biopsy as well as final recommendations.  Please call my office if you have not received a letter after 3 weeks.   eSigned:  Milus Banister, MD 12/13/2013 9:08 AM

## 2013-12-13 NOTE — Progress Notes (Signed)
Report to pacu rn, vss, bbs=clear 

## 2013-12-14 ENCOUNTER — Telehealth: Payer: Self-pay

## 2013-12-14 NOTE — Telephone Encounter (Signed)
  Follow up Call-  Call back number 12/13/2013  Post procedure Call Back phone  # [phone]  Permission to leave phone message Yes     Patient questions:  Do you have a fever, pain , or abdominal swelling? no Pain Score  0 *  Have you tolerated food without any problems? yes  Have you been able to return to your normal activities? yes  Do you have any questions about your discharge instructions: Diet   no Medications  no Follow up visit  no  Do you have questions or concerns about your Care? no  Actions: * If pain score is 4 or above: No action needed, pain <4.

## 2013-12-16 ENCOUNTER — Encounter: Payer: Medicare Other | Admitting: Gastroenterology

## 2013-12-20 ENCOUNTER — Encounter: Payer: Self-pay | Admitting: Gastroenterology

## 2013-12-22 ENCOUNTER — Ambulatory Visit: Payer: Medicare Other

## 2013-12-28 ENCOUNTER — Ambulatory Visit (INDEPENDENT_AMBULATORY_CARE_PROVIDER_SITE_OTHER): Payer: Medicare Other | Admitting: Nurse Practitioner

## 2013-12-28 ENCOUNTER — Encounter: Payer: Self-pay | Admitting: Nurse Practitioner

## 2013-12-28 VITALS — BP 136/82 | HR 102 | Temp 98.2°F | Resp 10 | Ht 60.0 in | Wt 237.0 lb

## 2013-12-28 DIAGNOSIS — Z23 Encounter for immunization: Secondary | ICD-10-CM

## 2013-12-28 DIAGNOSIS — G8929 Other chronic pain: Secondary | ICD-10-CM | POA: Diagnosis not present

## 2013-12-28 DIAGNOSIS — Z124 Encounter for screening for malignant neoplasm of cervix: Secondary | ICD-10-CM | POA: Diagnosis not present

## 2013-12-28 DIAGNOSIS — I10 Essential (primary) hypertension: Secondary | ICD-10-CM | POA: Diagnosis not present

## 2013-12-28 DIAGNOSIS — F17200 Nicotine dependence, unspecified, uncomplicated: Secondary | ICD-10-CM | POA: Diagnosis not present

## 2013-12-28 DIAGNOSIS — D500 Iron deficiency anemia secondary to blood loss (chronic): Secondary | ICD-10-CM

## 2013-12-28 DIAGNOSIS — Z720 Tobacco use: Secondary | ICD-10-CM

## 2013-12-28 MED ORDER — AMLODIPINE BESYLATE 10 MG PO TABS: 10.0000 mg | ORAL_TABLET | Freq: Every day | ORAL | Status: DC

## 2013-12-28 MED ORDER — OXYCODONE HCL 5 MG PO TABS: 5.0000 mg | ORAL_TABLET | ORAL | Status: DC | PRN

## 2013-12-28 MED ORDER — LISINOPRIL 5 MG PO TABS: 5.0000 mg | ORAL_TABLET | Freq: Every day | ORAL | Status: DC

## 2013-12-28 MED ORDER — PANTOPRAZOLE SODIUM 40 MG PO TBEC: 40.0000 mg | DELAYED_RELEASE_TABLET | Freq: Every day | ORAL | Status: DC

## 2013-12-28 MED ORDER — TETANUS-DIPHTH-ACELL PERTUSSIS 5-2.5-18.5 LF-MCG/0.5 IM SUSP: 0.5000 mL | Freq: Once | INTRAMUSCULAR | Status: DC

## 2013-12-28 NOTE — Progress Notes (Signed)
Patient ID: Tiffany [name], female   DOB: 11-Oct-1950, 63 y.o.   MRN: [id number]    No Known Allergies  Chief Complaint  Patient presents with  . Annual Exam    CPX with pap and fasting labs (? patient had candy this am) . EKG completed on 08/30/2013     HPI: Patient is a 63 y.o. female seen in the office today for extended visit, pt reports she has had another death in the family, daughters husband. Lots of stress related to that. Unable to eat healthy or exercise  Has had colonoscopy and has mammogram scheduled.  conts to smoke- 4 cigarettes a day Has not had pne vaccine. Overall no complaints conts to need occasional oxycodone due to knee, leg and back pain which helps pain. No side effects from medication Review of Systems:  Review of Systems  Constitutional: Negative for fever, chills, weight loss and malaise/fatigue.  HENT: Negative for congestion, ear pain, sore throat and tinnitus.   Eyes: Negative for blurred vision.  Respiratory: Negative for cough, sputum production and shortness of breath.   Cardiovascular: Negative for chest pain, palpitations and leg swelling.  Gastrointestinal: Negative for heartburn, abdominal pain, diarrhea and constipation.  Genitourinary: Negative for dysuria, urgency and frequency.  Musculoskeletal: Positive for joint pain (pain in knees) and myalgias (in legs).  Skin: Negative for itching and rash.  Neurological: Negative for dizziness, tingling, sensory change, focal weakness and weakness.  Psychiatric/Behavioral: Negative for depression. The patient is not nervous/anxious and does not have insomnia.      Past Medical History  Diagnosis Date  . Arthritis     osteoarthritis  . Hx of bronchitis   . Hypertension   . GERD (gastroesophageal reflux disease)    Past Surgical History  Procedure Laterality Date  . Knee surgery      arthroscopic right  . Toe nail removal    . 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  She has a 5 pack-year smoking history. She has never used smokeless tobacco. She reports that she does not drink alcohol or use illicit drugs.  Family History  Problem Relation Age of Onset  . Heart disease Father     Died of CHF age 57  . Heart disease Mother     Died of CHF age 41  . Cancer Brother     Cancer  . Heart failure Brother     Died of CHF   . High blood pressure Son   . Colon cancer Neg Hx     Medications: Patient's Medications  New Prescriptions   No medications on file  Previous Medications   ASPIRIN 325 MG EC TABLET    Take 325 mg by mouth daily.   METHOCARBAMOL (ROBAXIN) 500 MG TABLET    Take 1 tablet (500 mg total) by mouth every 6 (six) hours as needed for muscle spasms (For Knee Pain/Spasms).  Modified Medications   Modified Medication Previous Medication   AMLODIPINE (NORVASC) 10 MG TABLET amLODipine (NORVASC) 10 MG tablet      Take 1 tablet (10 mg total) by mouth daily.    Take 1 tablet (10 mg total) by mouth daily.   LISINOPRIL (PRINIVIL,ZESTRIL) 5 MG TABLET lisinopril (PRINIVIL,ZESTRIL) 5 MG tablet      Take 1 tablet (  5 mg total) by mouth daily.    Take 1 tablet (5 mg total) by mouth daily.   OXYCODONE (OXY IR/ROXICODONE) 5 MG IMMEDIATE RELEASE TABLET oxyCODONE (OXY IR/ROXICODONE) 5 MG immediate release tablet      Take 1 tablet (5 mg total) by mouth every 4 (four) hours as needed for severe pain.    Take 1 tablet (5 mg total) by mouth every 4 (four) hours as needed for severe pain.   PANTOPRAZOLE (PROTONIX) 40 MG TABLET pantoprazole (PROTONIX) 40 MG tablet      Take 1 tablet (40 mg total) by mouth daily.    Take 40 mg by mouth daily.   TDAP (BOOSTRIX) 5-2.5-18.5 LF-MCG/0.5 INJECTION Tdap (BOOSTRIX) 5-2.5-18.5 LF-MCG/0.5 injection      Inject 0.5 mLs into the muscle once.    Inject  0.5 mLs into the muscle once.  Discontinued Medications   No medications on file     Physical Exam:  Filed Vitals:   12/28/13 1049  BP: 136/82  Pulse: 102  Temp: 98.2 F (36.8 C)  TempSrc: Oral  Resp: 10  Height: 5' (1.524 m)  Weight: 237 lb (107.502 kg)  SpO2: 92%    Physical Exam  Constitutional: She is oriented to person, place, and time and well-developed, well-nourished, and in no distress.  HENT:  Head: Normocephalic and atraumatic.  Right Ear: External ear normal.  Left Ear: External ear normal.  Nose: Nose normal.  Mouth/Throat: Oropharynx is clear and moist. No oropharyngeal exudate.  Poor dentition  Eyes: Conjunctivae and EOM are normal. Pupils are equal, round, and reactive to light.  Neck: Normal range of motion. Neck supple.  Cardiovascular: Normal rate, regular rhythm, normal heart sounds and intact distal pulses.   Pulmonary/Chest: Effort normal and breath sounds normal. No respiratory distress. She has no wheezes.  Abdominal: Soft. Bowel sounds are normal. She exhibits no distension. There is no tenderness.  Genitourinary: Vagina normal, cervix normal, right adnexa normal and left adnexa normal. No vaginal discharge found.  Musculoskeletal: Normal range of motion. She exhibits no edema and no tenderness.  Neurological: She is alert and oriented to person, place, and time. No cranial nerve deficit. Gait normal.  Skin: Skin is warm and dry. She is not diaphoretic.  Psychiatric: Affect normal.     Labs reviewed: Basic Metabolic Panel:  Recent Labs  06/02/13 0428 08/29/13 1525 08/29/13 1535 08/30/13 0226 08/30/13 0850  NA 137 141 144 140  --   K 4.1 4.3 4.1 4.1  --   CL 102 106 107 105  --   CO2 26 26  --  25  --   GLUCOSE 128* 105* 103* 104*  --   BUN 16 16 16 17   --   CREATININE 0.83 0.81 0.90 0.80  --   CALCIUM 8.9 8.9  --  8.6  --   TSH  --   --   --   --  1.479   Liver Function Tests:  Recent Labs  08/30/13 0226  AST 19  ALT 17    ALKPHOS 141*  BILITOT <0.2*  PROT 7.2  ALBUMIN 3.0*   No results found for this basename: LIPASE, AMYLASE,  in the last 8760 hours No results found for this basename: AMMONIA,  in the last 8760 hours CBC:  Recent Labs  06/02/13 0428 08/29/13 1525 08/29/13 1535 08/30/13 0226  WBC 13.6* 5.6  --  7.3  HGB 10.7* 11.4* 12.9 11.3*  HCT 33.2* 36.1 38.0 35.7*  MCV 84.1  83.8  --  84.2  PLT 297 303  --  292   Lipid Panel:  Recent Labs  08/29/13 2000  CHOL 147  HDL 43  LDLCALC 81  TRIG 117  CHOLHDL 3.4   TSH:  Recent Labs  08/30/13 0850  TSH 1.479   Assessment/Plan 1. Hypertension -controlled on current medications, will follow up labs - amLODipine (NORVASC) 10 MG tablet; Take 1 tablet (10 mg total) by mouth daily.  Dispense: 90 tablet; Refill: 1 - lisinopril (PRINIVIL,ZESTRIL) 5 MG tablet; Take 1 tablet (5 mg total) by mouth daily.  Dispense: 90 tablet; Refill: 1 - Comprehensive metabolic panel - Lipid panel  2. Screening for malignant neoplasm of the cervix - PAP, Image Guided [[facility], [facility]]  3. Morbid obesity -discuss need for weight loss -lifestyle modifications diet and exercise discussed  4. Tobacco abuse -smoking cessation discussed and information provided  - Pneumococcal polysaccharide vaccine 23-valent greater than or equal to 2yo subcutaneous/IM  5. Chronic pain - oxyCODONE (OXY IR/ROXICODONE) 5 MG immediate release tablet; Take 1 tablet (5 mg total) by mouth every 4 (four) hours as needed for severe pain.  Dispense: 180 tablet; Refill: 0  6. Need for prophylactic vaccination against Streptococcus pneumoniae (pneumococcus) - Pneumococcal polysaccharide vaccine 23-valent greater than or equal to 2yo subcutaneous/IM  7. anemia - follow up CBC With differential/Platelet  PREVENTIVE COUNSELING:  The patient was counseled regarding the appropriate use of alcohol, regular self-examination of the breasts on a monthly basis, prevention of dental and  periodontal disease, diet, regular sustained exercise for at least 30 minutes 5 times per week, routine screening interval for mammogram as recommended by the [place] and ACOG, importance of regular PAP smears, smoking cessation, the proper use of sunscreen and protective clothing, tobacco use,  and recommended schedule for GI hemoccult testing, colonoscopy, cholesterol, thyroid and diabetes screening.   To follow up in 6 months with [facility]

## 2013-12-28 NOTE — Patient Instructions (Signed)
Will need to follow up with Dr Bubba Camp in 6 months  Weight loss encouraged, this will help with pain 30 mins of cardiovascular/5 days a week  Heart healthy diet  Smoking Cessation Quitting smoking is important to your health and has many advantages. However, it is not always easy to quit since nicotine is a very addictive drug. Often times, people try 3 times or more before being able to quit. This document explains the best ways for you to prepare to quit smoking. Quitting takes hard work and a lot of effort, but you can do it. ADVANTAGES OF QUITTING SMOKING  You will live longer, feel better, and live better.  Your body will feel the impact of quitting smoking almost immediately.  Within 20 minutes, blood pressure decreases. Your pulse returns to its normal level.  After 8 hours, carbon monoxide levels in the blood return to normal. Your oxygen level increases.  After 24 hours, the chance of having a heart attack starts to decrease. Your breath, hair, and body stop smelling like smoke.  After 48 hours, damaged nerve endings begin to recover. Your sense of taste and smell improve.  After 72 hours, the body is virtually free of nicotine. Your bronchial tubes relax and breathing becomes easier.  After 2 to 12 weeks, lungs can hold more air. Exercise becomes easier and circulation improves.  The risk of having a heart attack, stroke, cancer, or lung disease is greatly reduced.  After 1 year, the risk of coronary heart disease is cut in half.  After 5 years, the risk of stroke falls to the same as a nonsmoker.  After 10 years, the risk of lung cancer is cut in half and the risk of other cancers decreases significantly.  After 15 years, the risk of coronary heart disease drops, usually to the level of a nonsmoker.  If you are pregnant, quitting smoking will improve your chances of having a healthy baby.  The people you live with, especially any children, will be healthier.  You  will have extra money to spend on things other than cigarettes. QUESTIONS TO THINK ABOUT BEFORE ATTEMPTING TO QUIT You may want to talk about your answers with your caregiver.  Why do you want to quit?  If you tried to quit in the past, what helped and what did not?  What will be the most difficult situations for you after you quit? How will you plan to handle them?  Who can help you through the tough times? Your family? Friends? A caregiver?  What pleasures do you get from smoking? What ways can you still get pleasure if you quit? Here are some questions to ask your caregiver:  How can you help me to be successful at quitting?  What medicine do you think would be best for me and how should I take it?  What should I do if I need more help?  What is smoking withdrawal like? How can I get information on withdrawal? GET READY  Set a quit date.  Change your environment by getting rid of all cigarettes, ashtrays, matches, and lighters in your home, car, or work. Do not let people smoke in your home.  Review your past attempts to quit. Think about what worked and what did not. GET SUPPORT AND ENCOURAGEMENT You have a better chance of being successful if you have help. You can get support in many ways.  Tell your family, friends, and co-workers that you are going to quit and need their  support. Ask them not to smoke around you.  Get individual, group, or telephone counseling and support. Programs are available at [employer] and health centers. Call your local health department for information about programs in your area.  Spiritual beliefs and practices may help some smokers quit.  Download a "quit meter" on your computer to keep track of quit statistics, such as how long you have gone without smoking, cigarettes not smoked, and money saved.  Get a self-help book about quitting smoking and staying off of tobacco. [place] yourself from urges to  smoke. Talk to someone, go for a walk, or occupy your time with a task.  Change your normal routine. Take a different route to work. Drink tea instead of coffee. Eat breakfast in a different place.  Reduce your stress. Take a hot bath, exercise, or read a book.  Plan something enjoyable to do every day. Reward yourself for not smoking.  Explore interactive web-based programs that specialize in helping you quit. GET MEDICINE AND USE IT CORRECTLY Medicines can help you stop smoking and decrease the urge to smoke. Combining medicine with the above behavioral methods and support can greatly increase your chances of successfully quitting smoking.  Nicotine replacement therapy helps deliver nicotine to your body without the negative effects and risks of smoking. Nicotine replacement therapy includes nicotine gum, lozenges, inhalers, nasal sprays, and skin patches. Some may be available over-the-counter and others require a prescription.  Antidepressant medicine helps people abstain from smoking, but how this works is unknown. This medicine is available by prescription.  Nicotinic receptor partial agonist medicine simulates the effect of nicotine in your brain. This medicine is available by prescription. Ask your caregiver for advice about which medicines to use and how to use them based on your health history. Your caregiver will tell you what side effects to look out for if you choose to be on a medicine or therapy. Carefully read the information on the package. Do not use any other product containing nicotine while using a nicotine replacement product.  RELAPSE OR DIFFICULT SITUATIONS Most relapses occur within the first 3 months after quitting. Do not be discouraged if you start smoking again. Remember, most people try several times before finally quitting. You may have symptoms of withdrawal because your body is used to nicotine. You may crave cigarettes, be irritable, feel very hungry, cough often,  get headaches, or have difficulty concentrating. The withdrawal symptoms are only temporary. They are strongest when you first quit, but they will go away within 10 14 days. To reduce the chances of relapse, try to:  Avoid drinking alcohol. Drinking lowers your chances of successfully quitting.  Reduce the amount of caffeine you consume. Once you quit smoking, the amount of caffeine in your body increases and can give you symptoms, such as a rapid heartbeat, sweating, and anxiety.  Avoid smokers because they can make you want to smoke.  Do not let weight gain distract you. Many smokers will gain weight when they quit, usually less than 10 pounds. Eat a healthy diet and stay active. You can always lose the weight gained after you quit.  Find ways to improve your mood other than smoking. FOR MORE INFORMATION  www.smokefree.gov  Document Released: 08/05/2001 Document Revised: 02/10/2012 Document Reviewed: 11/20/2011 [facility] Patient Information 2014 [place], [place].

## 2013-12-29 ENCOUNTER — Encounter: Payer: Self-pay | Admitting: *Deleted

## 2013-12-29 LAB — COMPREHENSIVE METABOLIC PANEL
ALT: 12 IU/L (ref 0–32)
AST: 15 IU/L (ref 0–40)
Albumin/Globulin Ratio: 1.4 (ref 1.1–2.5)
Albumin: 4.2 g/dL (ref 3.6–4.8)
Alkaline Phosphatase: 134 IU/L — ABNORMAL HIGH (ref 39–117)
BILIRUBIN TOTAL: 0.3 mg/dL (ref 0.0–1.2)
BUN/Creatinine Ratio: 16 (ref 11–26)
BUN: 16 mg/dL (ref 8–27)
CALCIUM: 9.2 mg/dL (ref 8.7–10.3)
CO2: 25 mmol/L (ref 18–29)
Chloride: 102 mmol/L (ref 97–108)
Creatinine, Ser: 1 mg/dL (ref 0.57–1.00)
GFR calc Af Amer: 70 mL/min/{1.73_m2} (ref >59)
GFR calc non Af Amer: 61 mL/min/{1.73_m2} (ref >59)
Globulin, Total: 3.1 g/dL (ref 1.5–4.5)
Glucose: 111 mg/dL — ABNORMAL HIGH (ref 65–99)
POTASSIUM: 3.7 mmol/L (ref 3.5–5.2)
SODIUM: 144 mmol/L (ref 134–144)
Total Protein: 7.3 g/dL (ref 6.0–8.5)

## 2013-12-29 LAB — CBC WITH DIFFERENTIAL
Basophils Absolute: 0 10*3/uL (ref 0.0–0.2)
Basos: 0 %
EOS: 2 %
Eosinophils Absolute: 0.1 10*3/uL (ref 0.0–0.4)
HCT: 39.3 % (ref 34.0–46.6)
Hemoglobin: 12.5 g/dL (ref 11.1–15.9)
Immature Grans (Abs): 0 10*3/uL (ref 0.0–0.1)
Immature Granulocytes: 0 %
Lymphocytes Absolute: 1.7 10*3/uL (ref 0.7–3.1)
Lymphs: 28 %
MCH: 26.4 pg — AB (ref 26.6–33.0)
MCHC: 31.8 g/dL (ref 31.5–35.7)
MCV: 83 fL (ref 79–97)
MONOCYTES: 6 %
MONOS ABS: 0.4 10*3/uL (ref 0.1–0.9)
NEUTROS ABS: 3.8 10*3/uL (ref 1.4–7.0)
Neutrophils Relative %: 64 %
PLATELETS: 279 10*3/uL (ref 150–379)
RBC: 4.73 x10E6/uL (ref 3.77–5.28)
RDW: 15.8 % — AB (ref 12.3–15.4)
WBC: 5.9 10*3/uL (ref 3.4–10.8)

## 2013-12-29 LAB — LIPID PANEL
CHOLESTEROL TOTAL: 114 mg/dL (ref 100–199)
Chol/HDL Ratio: 2.9 ratio units (ref 0.0–4.4)
HDL: 40 mg/dL (ref >39)
LDL Calculated: 55 mg/dL (ref 0–99)
Triglycerides: 96 mg/dL (ref 0–149)
VLDL CHOLESTEROL CAL: 19 mg/dL (ref 5–40)

## 2014-01-02 LAB — PAP IG (IMAGE GUIDED): PAP SMEAR COMMENT: 0

## 2014-01-05 ENCOUNTER — Encounter: Payer: Self-pay | Admitting: Nurse Practitioner

## 2014-01-11 ENCOUNTER — Ambulatory Visit: Payer: Medicare Other

## 2014-01-12 ENCOUNTER — Encounter: Payer: Self-pay | Admitting: Nurse Practitioner

## 2014-01-12 ENCOUNTER — Ambulatory Visit (INDEPENDENT_AMBULATORY_CARE_PROVIDER_SITE_OTHER): Payer: Medicare Other | Admitting: Nurse Practitioner

## 2014-01-12 VITALS — BP 164/100 | HR 88 | Temp 98.0°F | Resp 20 | Wt 241.2 lb

## 2014-01-12 DIAGNOSIS — A599 Trichomoniasis, unspecified: Secondary | ICD-10-CM

## 2014-01-12 DIAGNOSIS — Z113 Encounter for screening for infections with a predominantly sexual mode of transmission: Secondary | ICD-10-CM

## 2014-01-12 DIAGNOSIS — Z202 Contact with and (suspected) exposure to infections with a predominantly sexual mode of transmission: Secondary | ICD-10-CM

## 2014-01-12 MED ORDER — METRONIDAZOLE 500 MG PO TABS: 2000.0000 mg | ORAL_TABLET | Freq: Once | ORAL | Status: DC
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
## 2014-01-13 ENCOUNTER — Other Ambulatory Visit: Payer: Self-pay

## 2014-01-13 DIAGNOSIS — Z202 Contact with and (suspected) exposure to infections with a predominantly sexual mode of transmission: Secondary | ICD-10-CM

## 2014-01-13 DIAGNOSIS — A599 Trichomoniasis, unspecified: Secondary | ICD-10-CM

## 2014-01-13 LAB — GC/CHLAMYDIA PROBE AMP

## 2014-01-13 LAB — RPR: RPR: NONREACTIVE

## 2014-01-16 ENCOUNTER — Encounter: Payer: Self-pay | Admitting: Nurse Practitioner

## 2014-01-17 ENCOUNTER — Other Ambulatory Visit: Payer: Self-pay

## 2014-01-24 ENCOUNTER — Ambulatory Visit: Payer: Medicare Other

## 2014-01-31 ENCOUNTER — Encounter: Payer: Self-pay | Admitting: Nurse Practitioner

## 2014-02-07 ENCOUNTER — Ambulatory Visit: Payer: Medicare Other

## 2014-02-16 ENCOUNTER — Ambulatory Visit: Payer: Medicare Other

## 2014-02-28 DIAGNOSIS — H25019 Cortical age-related cataract, unspecified eye: Secondary | ICD-10-CM | POA: Diagnosis not present

## 2014-02-28 DIAGNOSIS — H00029 Hordeolum internum unspecified eye, unspecified eyelid: Secondary | ICD-10-CM | POA: Diagnosis not present

## 2014-02-28 DIAGNOSIS — H35369 Drusen (degenerative) of macula, unspecified eye: Secondary | ICD-10-CM | POA: Diagnosis not present

## 2014-02-28 DIAGNOSIS — H2510 Age-related nuclear cataract, unspecified eye: Secondary | ICD-10-CM | POA: Diagnosis not present

## 2014-03-06 ENCOUNTER — Ambulatory Visit: Payer: Medicare Other

## 2014-03-07 ENCOUNTER — Emergency Department (HOSPITAL_COMMUNITY)
Admission: EM | Admit: 2014-03-07 | Discharge: 2014-03-07 | Disposition: A | Discharge status: Home / Self Care | Payer: Medicare Other | Attending: Emergency Medicine | Admitting: Emergency Medicine

## 2014-03-07 ENCOUNTER — Emergency Department (HOSPITAL_COMMUNITY): Payer: Medicare Other

## 2014-03-07 ENCOUNTER — Encounter (HOSPITAL_COMMUNITY): Payer: Self-pay | Admitting: Emergency Medicine

## 2014-03-07 DIAGNOSIS — F17200 Nicotine dependence, unspecified, uncomplicated: Secondary | ICD-10-CM | POA: Diagnosis not present

## 2014-03-07 DIAGNOSIS — R21 Rash and other nonspecific skin eruption: Secondary | ICD-10-CM | POA: Diagnosis not present

## 2014-03-07 DIAGNOSIS — Z79899 Other long term (current) drug therapy: Secondary | ICD-10-CM | POA: Diagnosis not present

## 2014-03-07 DIAGNOSIS — R52 Pain, unspecified: Secondary | ICD-10-CM | POA: Diagnosis not present

## 2014-03-07 DIAGNOSIS — R51 Headache: Secondary | ICD-10-CM

## 2014-03-07 DIAGNOSIS — Z7982 Long term (current) use of aspirin: Secondary | ICD-10-CM | POA: Diagnosis not present

## 2014-03-07 DIAGNOSIS — I6789 Other cerebrovascular disease: Secondary | ICD-10-CM | POA: Diagnosis not present

## 2014-03-07 DIAGNOSIS — I10 Essential (primary) hypertension: Secondary | ICD-10-CM | POA: Diagnosis not present

## 2014-03-07 DIAGNOSIS — M19039 Primary osteoarthritis, unspecified wrist: Secondary | ICD-10-CM | POA: Diagnosis not present

## 2014-03-07 DIAGNOSIS — R519 Headache, unspecified: Secondary | ICD-10-CM

## 2014-03-07 DIAGNOSIS — K219 Gastro-esophageal reflux disease without esophagitis: Secondary | ICD-10-CM | POA: Insufficient documentation

## 2014-03-07 DIAGNOSIS — J0130 Acute sphenoidal sinusitis, unspecified: Secondary | ICD-10-CM | POA: Diagnosis not present

## 2014-03-07 DIAGNOSIS — M25539 Pain in unspecified wrist: Secondary | ICD-10-CM | POA: Diagnosis not present

## 2014-03-07 DIAGNOSIS — M1990 Unspecified osteoarthritis, unspecified site: Secondary | ICD-10-CM | POA: Insufficient documentation

## 2014-03-07 DIAGNOSIS — G9389 Other specified disorders of brain: Secondary | ICD-10-CM | POA: Diagnosis not present

## 2014-03-07 LAB — CBC WITH DIFFERENTIAL/PLATELET
Basophils Absolute: 0 10*3/uL (ref 0.0–0.1)
Basophils Relative: 0 % (ref 0–1)
Eosinophils Absolute: 0.2 10*3/uL (ref 0.0–0.7)
Eosinophils Relative: 3 % (ref 0–5)
HCT: 37.8 % (ref 36.0–46.0)
Hemoglobin: 12.2 g/dL (ref 12.0–15.0)
Lymphocytes Relative: 27 % (ref 12–46)
Lymphs Abs: 1.4 10*3/uL (ref 0.7–4.0)
MCH: 26.1 pg (ref 26.0–34.0)
MCHC: 32.3 g/dL (ref 30.0–36.0)
MCV: 80.8 fL (ref 78.0–100.0)
Monocytes Absolute: 0.3 10*3/uL (ref 0.1–1.0)
Monocytes Relative: 5 % (ref 3–12)
Neutro Abs: 3.4 10*3/uL (ref 1.7–7.7)
Neutrophils Relative %: 65 % (ref 43–77)
Platelets: 259 10*3/uL (ref 150–400)
RBC: 4.68 MIL/uL (ref 3.87–5.11)
RDW: 15.3 % (ref 11.5–15.5)
WBC: 5.2 10*3/uL (ref 4.0–10.5)

## 2014-03-07 LAB — BASIC METABOLIC PANEL
Anion gap: 14 (ref 5–15)
BUN: 15 mg/dL (ref 6–23)
CO2: 24 mEq/L (ref 19–32)
Calcium: 9.1 mg/dL (ref 8.4–10.5)
Chloride: 104 mEq/L (ref 96–112)
Creatinine, Ser: 0.79 mg/dL (ref 0.50–1.10)
GFR calc Af Amer: >90 mL/min (ref >90)
GFR calc non Af Amer: 87 mL/min — ABNORMAL LOW (ref >90)
Glucose, Bld: 104 mg/dL — ABNORMAL HIGH (ref 70–99)
Potassium: 4.2 mEq/L (ref 3.7–5.3)
Sodium: 142 mEq/L (ref 137–147)

## 2014-03-07 LAB — URINALYSIS, ROUTINE W REFLEX MICROSCOPIC
Bilirubin Urine: NEGATIVE
Glucose, UA: NEGATIVE mg/dL
Ketones, ur: NEGATIVE mg/dL
Nitrite: NEGATIVE
Protein, ur: NEGATIVE mg/dL
Specific Gravity, Urine: 1.018 (ref 1.005–1.030)
Urobilinogen, UA: 1 mg/dL (ref 0.0–1.0)
pH: 6.5 (ref 5.0–8.0)

## 2014-03-07 LAB — URINE MICROSCOPIC-ADD ON

## 2014-03-07 MED ORDER — KETOROLAC TROMETHAMINE 30 MG/ML IJ SOLN
30.0000 mg | Freq: Once | INTRAMUSCULAR | Status: AC
  Administered 2014-03-07: 30 mg via INTRAVENOUS
  Filled 2014-03-07: qty 1

## 2014-03-07 MED ORDER — SODIUM CHLORIDE 0.9 % IV BOLUS (SEPSIS)
1000.0000 mL | Freq: Once | INTRAVENOUS | Status: AC
  Administered 2014-03-07: 1000 mL via INTRAVENOUS

## 2014-03-07 MED ORDER — HYDROCODONE-ACETAMINOPHEN 5-325 MG PO TABS: 1.0000 | ORAL_TABLET | Freq: Four times a day (QID) | ORAL | Status: DC | PRN

## 2014-03-07 MED ORDER — AMOXICILLIN-POT CLAVULANATE 875-125 MG PO TABS
1.0000 | ORAL_TABLET | Freq: Two times a day (BID) | ORAL | Status: DC
Start: 2014-03-07 — End: 2014-04-08

## 2014-03-07 MED ORDER — PREDNISONE 50 MG PO TABS: 50.0000 mg | ORAL_TABLET | Freq: Every day | ORAL | Status: DC

## 2014-03-07 NOTE — ED Notes (Signed)
Patient transported to CT 

## 2014-03-07 NOTE — ED Provider Notes (Signed)
CSN: [id number]     Arrival date & time 03/07/14  1031 History   First MD Initiated Contact with Patient 03/07/14 1108     Chief Complaint  Patient presents with  . Headache  . Wrist Pain  . Rash     (Consider location/radiation/quality/duration/timing/severity/associated sxs/prior Treatment) HPI Patient presents to the emergency department with headache and wrist pain.  Patient, states, that this been ongoing for the past 2 weeks.  Patient, states headache has gotten worse over the last couple days.  Patient, states, that is a sharp pain to the face and temple.  Patient, states she does not have any fever, nausea, vomiting, weakness, dizziness, blurred vision, back pain, neck pain, chest pain, shortness of breath, fever, or syncope.  The patient, states she did not take any medications prior to arrival.  Patient, states her symptoms have been constant.  Patient, states she has not seen her primary care Dr. Past Medical History  Diagnosis Date  . Arthritis     osteoarthritis  . Hx of bronchitis   . Hypertension   . GERD (gastroesophageal reflux disease)    Past Surgical History  Procedure Laterality Date  . Knee surgery      arthroscopic right  . Toe nail removal    . 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 Heart disease Father     Died of CHF age 91  . Heart disease Mother     Died of CHF age 51  . Cancer Brother     Cancer  . Heart failure Brother     Died of CHF   . High blood pressure Son   . Colon cancer Neg Hx    History  Substance Use Topics  . Smoking status: Current Every Day Smoker -- 0.25 packs/day for 20 years    Types: Cigarettes  . Smokeless tobacco: Never Used  . Alcohol Use: No   OB History   Grav Para Term Preterm Abortions TAB SAB Ect Mult Living                  Review of Systems All other systems negative except as documented in the HPI. All pertinent positives and negatives as reviewed in the HPI.   Allergies  Review of patient's allergies indicates no known allergies.  Home Medications   Prior to Admission medications   Medication Sig Start Date End Date Taking? Authorizing Provider  aspirin 325 MG EC tablet Take 325 mg by mouth daily.   Yes Historical Provider, MD  lisinopril (PRINIVIL,ZESTRIL) 5 MG tablet Take 1 tablet (5 mg total) by mouth daily. 12/28/13  Yes Pricilla Larsson, NP  methocarbamol (ROBAXIN) 500 MG tablet Take 1 tablet (500 mg total) by mouth every 6 (six) hours as needed for muscle spasms (For Knee Pain/Spasms). 11/03/13  Yes Pricilla Larsson, NP  pantoprazole (PROTONIX) 40 MG tablet Take 1 tablet (40 mg total) by mouth daily. 12/28/13  Yes Pricilla Larsson, NP   BP 149/73  Pulse 73  Temp(Src) 98.4 F (36.9 C) (Oral)  Resp 16  SpO2 96% Physical Exam  Nursing note and vitals reviewed. Constitutional: She is oriented to person, place, and time. She appears well-developed and well-nourished. No distress.  HENT:  Head: Normocephalic and atraumatic.  Mouth/Throat: Oropharynx is clear and  moist.  Eyes: Conjunctivae and EOM are normal. Pupils are equal, round, and reactive to light.  Neck: Normal range of motion. Neck supple. No thyromegaly present.  Cardiovascular: Normal rate and regular rhythm.  Exam reveals no gallop and no friction rub.   No murmur heard. Pulmonary/Chest: Effort normal and breath sounds normal. No respiratory distress.  Neurological: She is alert and oriented to person, place, and time. She exhibits normal muscle tone. Coordination normal.  Skin: Skin is warm and dry.    ED Course  Procedures (including critical care time) Labs Review Labs Reviewed  BASIC METABOLIC PANEL - Abnormal; Notable for the following:    Glucose, Bld 104 (*)    GFR calc non Af Amer 87 (*)    All other components within  normal limits  URINALYSIS, ROUTINE W REFLEX MICROSCOPIC - Abnormal; Notable for the following:    APPearance CLOUDY (*)    Hgb urine dipstick TRACE (*)    Leukocytes, UA LARGE (*)    All other components within normal limits  URINE MICROSCOPIC-ADD ON - Abnormal; Notable for the following:    Squamous Epithelial / LPF MANY (*)    Bacteria, UA FEW (*)    All other components within normal limits  CBC WITH DIFFERENTIAL    Imaging Review Dg Wrist Complete Left  03/07/2014   CLINICAL DATA:  Left wrist pain and swelling  EXAM: LEFT WRIST - COMPLETE 3+ VIEW  COMPARISON:  None.  FINDINGS: No fracture or dislocation. Severe osteoarthritis of the first [facility] joint. No soft tissue abnormality.  IMPRESSION: Severe osteoarthritis of the first left [facility] joint.   Electronically Signed   By: Kathreen Devoid   On: 03/07/2014 11:41   Ct Head Wo Contrast  03/07/2014   CLINICAL DATA:  Headache with rest pain and forearm rash ; no history of trauma  EXAM: CT HEAD WITHOUT CONTRAST  TECHNIQUE: Contiguous axial images were obtained from the base of the skull through the vertex without intravenous contrast.  COMPARISON:  Noncontrast CT scan of the brain of November 23, 2012  FINDINGS: The ventricles are normal in size and position. There is no intracranial hemorrhage nor intracranial mass effect. There is decreased density in the deep white matter of both cerebral hemispheres consistent with chronic small vessel ischemic change. The cerebellum and brainstem are normal.  There is fluid in the left sphenoid sinus cell. The other visualized paranasal sinuses are normal. The mastoid air cells are well pneumatized. There is no lytic or blastic skull lesion nor skull fracture.  IMPRESSION: 1. There is no acute intracranial hemorrhage nor other acute intracranial abnormality. 2. There are stable changes of chronic small vessel ischemia. 3. Acute left sphenoid sinusitis.   Electronically Signed   By: David  Martinique   On: 03/07/2014 12:51    Patient will be treated for the sinusitis.  Told to followup with her primary care Dr. patient is feeling better at this time.  Told to increase her fluid, intake.  She's not had any abnormalities in her vital signs.  Patient, given the results, and the plan and all questions were answered.  Voices an understanding     Brent [facility], [place] 03/07/14 1408

## 2014-03-07 NOTE — ED Notes (Addendum)
Pt c/o headache and L wrist pain x 1 week and rash on L forearm x "almost two weeks."  Pain score 8/10.  Denies blurred vision and denies injury to wrist.  Pt reports "it just started hurting."  Denies new lotions, creams, or perfumes.

## 2014-03-07 NOTE — Discharge Instructions (Signed)
Return here as needed.  Followup with your primary care Dr. increase your fluid intake °

## 2014-03-08 NOTE — ED Provider Notes (Signed)
Medical screening examination/treatment/procedure(s) were performed by non-physician practitioner and as supervising physician I was immediately available for consultation/collaboration.   EKG Interpretation None       Jasper Riling. Alvino Chapel, MD 03/08/14 [phone]

## 2014-04-03 ENCOUNTER — Ambulatory Visit
Admission: RE | Admit: 2014-04-03 | Discharge: 2014-04-03 | Disposition: A | Discharge status: Home / Self Care | Payer: Medicare Other | Source: Ambulatory Visit | Attending: Nurse Practitioner | Admitting: Nurse Practitioner

## 2014-04-03 DIAGNOSIS — Z1231 Encounter for screening mammogram for malignant neoplasm of breast: Secondary | ICD-10-CM | POA: Diagnosis not present

## 2014-04-04 ENCOUNTER — Other Ambulatory Visit: Payer: Self-pay | Admitting: Nurse Practitioner

## 2014-04-04 DIAGNOSIS — R928 Other abnormal and inconclusive findings on diagnostic imaging of breast: Secondary | ICD-10-CM

## 2014-04-08 ENCOUNTER — Encounter (HOSPITAL_COMMUNITY): Payer: Self-pay | Admitting: Emergency Medicine

## 2014-04-08 ENCOUNTER — Emergency Department (HOSPITAL_COMMUNITY)
Admission: EM | Admit: 2014-04-08 | Discharge: 2014-04-08 | Disposition: A | Discharge status: Home / Self Care | Payer: Medicare Other | Attending: Emergency Medicine | Admitting: Emergency Medicine

## 2014-04-08 DIAGNOSIS — Z79899 Other long term (current) drug therapy: Secondary | ICD-10-CM | POA: Diagnosis not present

## 2014-04-08 DIAGNOSIS — M25569 Pain in unspecified knee: Secondary | ICD-10-CM | POA: Diagnosis not present

## 2014-04-08 DIAGNOSIS — Z8709 Personal history of other diseases of the respiratory system: Secondary | ICD-10-CM | POA: Insufficient documentation

## 2014-04-08 DIAGNOSIS — M2550 Pain in unspecified joint: Secondary | ICD-10-CM

## 2014-04-08 DIAGNOSIS — I10 Essential (primary) hypertension: Secondary | ICD-10-CM | POA: Diagnosis not present

## 2014-04-08 DIAGNOSIS — M129 Arthropathy, unspecified: Secondary | ICD-10-CM | POA: Insufficient documentation

## 2014-04-08 DIAGNOSIS — M25519 Pain in unspecified shoulder: Secondary | ICD-10-CM | POA: Insufficient documentation

## 2014-04-08 DIAGNOSIS — M25539 Pain in unspecified wrist: Secondary | ICD-10-CM | POA: Diagnosis not present

## 2014-04-08 DIAGNOSIS — K219 Gastro-esophageal reflux disease without esophagitis: Secondary | ICD-10-CM | POA: Insufficient documentation

## 2014-04-08 DIAGNOSIS — Z7982 Long term (current) use of aspirin: Secondary | ICD-10-CM | POA: Insufficient documentation

## 2014-04-08 DIAGNOSIS — M79609 Pain in unspecified limb: Secondary | ICD-10-CM | POA: Insufficient documentation

## 2014-04-08 DIAGNOSIS — F17200 Nicotine dependence, unspecified, uncomplicated: Secondary | ICD-10-CM | POA: Insufficient documentation

## 2014-04-08 DIAGNOSIS — R21 Rash and other nonspecific skin eruption: Secondary | ICD-10-CM | POA: Diagnosis not present

## 2014-04-08 DIAGNOSIS — IMO0002 Reserved for concepts with insufficient information to code with codable children: Secondary | ICD-10-CM | POA: Diagnosis not present

## 2014-04-08 DIAGNOSIS — M25529 Pain in unspecified elbow: Secondary | ICD-10-CM | POA: Insufficient documentation

## 2014-04-08 MED ORDER — HYDROCODONE-ACETAMINOPHEN 5-325 MG PO TABS: 1.0000 | ORAL_TABLET | ORAL | Status: DC | PRN

## 2014-04-08 NOTE — Discharge Instructions (Signed)
Take Tylenol for mild pain or the pain medicine prescribed for bad pain. Contact your primary care physician if having significant pain by next week. Place hydrocortisone cream on the rash on your left arm as directed. Call Dr. Nevada Crane (dermatologist) to schedule an office visit if rash is not improving in 2 or 3 weeks .

## 2014-04-08 NOTE — ED Notes (Signed)
She c/o recent flare of pain in her left arm, plus chronic pain in her right knee.  She further c/o pruritic rash at upper left forearm area.  She is in no distress.  She states she has had total left knee by Dr. Alvan Dame; and plan to do right total knee soon.

## 2014-04-08 NOTE — ED Notes (Signed)
Pt states back pain to Left shoulder that rad down to hand and pain in Rt knee that rad down to right foot.  NAD at present.  Pt ambulatory.  Sts hx of osteoarthritis, unable to say if it feels the same.

## 2014-04-08 NOTE — ED Provider Notes (Signed)
CSN: [id number]     Arrival date & time 04/08/14  1527 History   First MD Initiated Contact with Patient 04/08/14 1605     Chief Complaint  Patient presents with  . Arm Pain     (Consider location/radiation/quality/duration/timing/severity/associated sxs/prior Treatment) HPI Patient complains of left shoulder pain, left wrist pain and left elbow pain onset approximately a week ago she also complains of right knee pain onset approximately week ago. She's had similar pain in the past which he thinks is secondary to osteoarthritis. Pain is worse with movement improved with remaining still No treatment prior to coming here. He has been treated for similar symptoms with hydrocodone in the past. She does report a paretic rash on the dorsal aspect of her left forearm onset approximately 2 weeks ago. He had a similar rash several years ago which resolved after treatment with topical medication. No fever no nausea or vomiting no chest pain or shortness of breath no other associated symptoms Past Medical History  Diagnosis Date  . Arthritis     osteoarthritis  . Hx of bronchitis   . Hypertension   . GERD (gastroesophageal reflux disease)    Past Surgical History  Procedure Laterality Date  . Knee surgery      arthroscopic right  . Toe nail removal    . 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 Heart disease Father     Died of CHF age 41  . Heart disease Mother     Died of CHF age 39  . Cancer Brother     Cancer  . Heart failure Brother     Died of CHF   . High blood pressure Son   . Colon cancer Neg Hx    History  Substance Use Topics  . Smoking status: Current Every Day Smoker -- 0.25 packs/day for 20 years    Types: Cigarettes  . Smokeless tobacco: Never Used  . Alcohol Use: No   OB History   Grav Para Term Preterm Abortions TAB SAB Ect Mult Living                 Review of Systems  Constitutional: Negative.   HENT: Negative.   Respiratory: Negative.   Cardiovascular: Negative.   Gastrointestinal: Negative.   Musculoskeletal: Positive for arthralgias.  Skin: Positive for rash.  Neurological: Negative.   Psychiatric/Behavioral: Negative.   All other systems reviewed and are negative.     Allergies  Review of patient's allergies indicates no known allergies.  Home Medications   Prior to Admission medications   Medication Sig Start Date End Date Taking? Authorizing Provider  aspirin 325 MG EC tablet Take 325 mg by mouth daily.    Historical Provider, MD  HYDROcodone-acetaminophen (NORCO/VICODIN) 5-325 MG per tablet Take 1 tablet by mouth every 6 (six) hours as needed for moderate pain. 03/07/14   Resa Miner Lawyer, PA-C  lisinopril (PRINIVIL,ZESTRIL) 5 MG tablet Take 1 tablet (5 mg total) by mouth daily. 12/28/13   Pricilla Larsson, NP  methocarbamol (ROBAXIN) 500 MG tablet Take 1 tablet (500 mg total) by mouth every 6 (six) hours as needed for muscle spasms (For Knee Pain/Spasms). 11/03/13   Pricilla Larsson, NP  pantoprazole (PROTONIX) 40 MG tablet Take 1 tablet (40 mg total) by mouth daily. 12/28/13  Pricilla Larsson, NP  predniSONE (DELTASONE) 50 MG tablet Take 1 tablet (50 mg total) by mouth daily. 03/07/14   Resa Miner Lawyer, PA-C   BP 145/84  Pulse 78  Temp(Src) 99 F (37.2 C) (Oral)  Resp 18  SpO2 100% Physical Exam  Nursing note and vitals reviewed. Constitutional: She appears well-developed and well-nourished.  HENT:  Head: Normocephalic and atraumatic.  Eyes: Conjunctivae are normal. Pupils are equal, round, and reactive to light.  Neck: Neck supple. No tracheal deviation present. No thyromegaly present.  Cardiovascular: Normal rate and regular rhythm.   No murmur heard. Pulmonary/Chest: Effort normal and breath sounds normal.  Abdominal: Soft. Bowel  sounds are normal. She exhibits no distension. There is no tenderness.  Musculoskeletal: Normal range of motion. She exhibits no edema and no tenderness.  All 4 extremities without redness swelling or tenderness neurovascular intact. Left upper extremity She has pain on active motion shoulder elbow and wrist radial pulse 2+. Good capillary refill. There is a grayish-appearing rash, patchy confluent, psoriatic-appearing at the dorsal aspect of her left forearm. No rash elsewhere.  Neurological: She is alert. Coordination normal.  Skin: Skin is warm and dry. Rash noted.  Rash on left forearm as mentioned above  Psychiatric: She has a normal mood and affect.    ED Course  Procedures (including critical care time) Labs Review Labs Reviewed - No data to display  Imaging Review No results found.   EKG Interpretation None      MDM  Plan prescription Norco, dermatology referral,  Diagnosis #1 arthralgias #2 rash Final diagnoses:  None        Orlie Dakin, MD 04/08/14 1702

## 2014-04-11 ENCOUNTER — Other Ambulatory Visit: Payer: Medicare Other

## 2014-04-21 ENCOUNTER — Other Ambulatory Visit: Payer: Medicare Other

## 2014-04-21 DIAGNOSIS — M1710 Unilateral primary osteoarthritis, unspecified knee: Secondary | ICD-10-CM | POA: Diagnosis not present

## 2014-04-21 DIAGNOSIS — M13169 Monoarthritis, not elsewhere classified, unspecified knee: Secondary | ICD-10-CM | POA: Diagnosis not present

## 2014-04-21 DIAGNOSIS — IMO0002 Reserved for concepts with insufficient information to code with codable children: Secondary | ICD-10-CM | POA: Diagnosis not present

## 2014-04-21 DIAGNOSIS — F17200 Nicotine dependence, unspecified, uncomplicated: Secondary | ICD-10-CM | POA: Diagnosis not present

## 2014-05-09 ENCOUNTER — Other Ambulatory Visit: Payer: Medicare Other

## 2014-05-13 ENCOUNTER — Encounter (HOSPITAL_COMMUNITY): Payer: Self-pay | Admitting: Emergency Medicine

## 2014-05-13 ENCOUNTER — Emergency Department (HOSPITAL_COMMUNITY): Payer: Medicare Other

## 2014-05-13 ENCOUNTER — Emergency Department (HOSPITAL_COMMUNITY)
Admission: EM | Admit: 2014-05-13 | Discharge: 2014-05-13 | Disposition: A | Discharge status: Home / Self Care | Payer: Medicare Other | Attending: Emergency Medicine | Admitting: Emergency Medicine

## 2014-05-13 DIAGNOSIS — K219 Gastro-esophageal reflux disease without esophagitis: Secondary | ICD-10-CM | POA: Diagnosis not present

## 2014-05-13 DIAGNOSIS — M7989 Other specified soft tissue disorders: Secondary | ICD-10-CM

## 2014-05-13 DIAGNOSIS — I10 Essential (primary) hypertension: Secondary | ICD-10-CM | POA: Insufficient documentation

## 2014-05-13 DIAGNOSIS — Z79899 Other long term (current) drug therapy: Secondary | ICD-10-CM | POA: Insufficient documentation

## 2014-05-13 DIAGNOSIS — M1710 Unilateral primary osteoarthritis, unspecified knee: Secondary | ICD-10-CM | POA: Diagnosis not present

## 2014-05-13 DIAGNOSIS — E669 Obesity, unspecified: Secondary | ICD-10-CM | POA: Diagnosis not present

## 2014-05-13 DIAGNOSIS — G8929 Other chronic pain: Secondary | ICD-10-CM

## 2014-05-13 DIAGNOSIS — R0602 Shortness of breath: Secondary | ICD-10-CM | POA: Diagnosis not present

## 2014-05-13 DIAGNOSIS — M25569 Pain in unspecified knee: Secondary | ICD-10-CM | POA: Diagnosis not present

## 2014-05-13 DIAGNOSIS — Z8709 Personal history of other diseases of the respiratory system: Secondary | ICD-10-CM | POA: Diagnosis not present

## 2014-05-13 DIAGNOSIS — Z96659 Presence of unspecified artificial knee joint: Secondary | ICD-10-CM | POA: Diagnosis not present

## 2014-05-13 DIAGNOSIS — IMO0002 Reserved for concepts with insufficient information to code with codable children: Secondary | ICD-10-CM | POA: Insufficient documentation

## 2014-05-13 DIAGNOSIS — Z7982 Long term (current) use of aspirin: Secondary | ICD-10-CM | POA: Insufficient documentation

## 2014-05-13 DIAGNOSIS — M79609 Pain in unspecified limb: Secondary | ICD-10-CM | POA: Diagnosis not present

## 2014-05-13 DIAGNOSIS — J40 Bronchitis, not specified as acute or chronic: Secondary | ICD-10-CM | POA: Diagnosis not present

## 2014-05-13 DIAGNOSIS — F17200 Nicotine dependence, unspecified, uncomplicated: Secondary | ICD-10-CM | POA: Insufficient documentation

## 2014-05-13 DIAGNOSIS — M1711 Unilateral primary osteoarthritis, right knee: Secondary | ICD-10-CM

## 2014-05-13 DIAGNOSIS — M25469 Effusion, unspecified knee: Secondary | ICD-10-CM | POA: Diagnosis not present

## 2014-05-13 LAB — CBC WITH DIFFERENTIAL/PLATELET
BASOS ABS: 0 10*3/uL (ref 0.0–0.1)
BASOS PCT: 0 % (ref 0–1)
EOS ABS: 0.2 10*3/uL (ref 0.0–0.7)
Eosinophils Relative: 2 % (ref 0–5)
HEMATOCRIT: 39.6 % (ref 36.0–46.0)
HEMOGLOBIN: 12.4 g/dL (ref 12.0–15.0)
LYMPHS ABS: 1.4 10*3/uL (ref 0.7–4.0)
Lymphocytes Relative: 18 % (ref 12–46)
MCH: 26.4 pg (ref 26.0–34.0)
MCHC: 31.3 g/dL (ref 30.0–36.0)
MCV: 84.4 fL (ref 78.0–100.0)
MONO ABS: 0.4 10*3/uL (ref 0.1–1.0)
MONOS PCT: 5 % (ref 3–12)
Neutro Abs: 5.7 10*3/uL (ref 1.7–7.7)
Neutrophils Relative %: 75 % (ref 43–77)
Platelets: 263 10*3/uL (ref 150–400)
RBC: 4.69 MIL/uL (ref 3.87–5.11)
RDW: 15.3 % (ref 11.5–15.5)
WBC: 7.7 10*3/uL (ref 4.0–10.5)

## 2014-05-13 LAB — BASIC METABOLIC PANEL
ANION GAP: 13 (ref 5–15)
BUN: 17 mg/dL (ref 6–23)
CO2: 23 meq/L (ref 19–32)
CREATININE: 0.82 mg/dL (ref 0.50–1.10)
Calcium: 9 mg/dL (ref 8.4–10.5)
Chloride: 101 mEq/L (ref 96–112)
GFR calc non Af Amer: 75 mL/min — ABNORMAL LOW (ref >90)
GFR, EST AFRICAN AMERICAN: 86 mL/min — AB (ref >90)
Glucose, Bld: 127 mg/dL — ABNORMAL HIGH (ref 70–99)
Potassium: 4.2 mEq/L (ref 3.7–5.3)
SODIUM: 137 meq/L (ref 137–147)

## 2014-05-13 LAB — I-STAT TROPONIN, ED: TROPONIN I, POC: 0.01 ng/mL (ref 0.00–0.08)

## 2014-05-13 LAB — PRO B NATRIURETIC PEPTIDE: Pro B Natriuretic peptide (BNP): 23.7 pg/mL (ref 0–125)

## 2014-05-13 MED ORDER — HYDROCODONE-ACETAMINOPHEN 5-325 MG PO TABS: 1.0000 | ORAL_TABLET | ORAL | Status: DC | PRN

## 2014-05-13 NOTE — ED Notes (Signed)
Pt verbalizes that she has ALL belongings that she arrived with and is leaving with them.

## 2014-05-13 NOTE — ED Notes (Signed)
Pt ambulated to restroom with walker and steady gait

## 2014-05-13 NOTE — ED Notes (Signed)
MD at bedside. 

## 2014-05-13 NOTE — ED Notes (Signed)
US at bedside

## 2014-05-13 NOTE — ED Notes (Signed)
Pt updated that she is next for Doppler. No further needs at this time.

## 2014-05-13 NOTE — ED Provider Notes (Signed)
CSN: [id number]     Arrival date & time 05/13/14  3810 History   First MD Initiated Contact with Patient 05/13/14 0831     Chief Complaint  Patient presents with  . Knee Pain    HPI The patient presents the emergency room with complaints of right knee pain for the last week. She does have a history of osteoarthritis. She had a joint replacement on her left knee and is expecting to have one done on the right. She has also started having some discomfort in her lower calf on that right side. She feels that she has increased swelling from her knee to her calf. She has noted a little bit of shortness of breath in the evenings over the last few days. She denies any chest pain. She denies any history of having a venous thrombosis in the past. She denies any fevers or coughing. Past Medical History  Diagnosis Date  . Arthritis     osteoarthritis  . Hx of bronchitis   . Hypertension   . GERD (gastroesophageal reflux disease)    Past Surgical History  Procedure Laterality Date  . Knee surgery      arthroscopic right  . Toe nail removal    . 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 Heart disease Father     Died of CHF age 94  . Heart disease Mother     Died of CHF age 61  . Cancer Brother     Cancer  . Heart failure Brother     Died of CHF   . High blood pressure Son   . Colon cancer Neg Hx    History  Substance Use Topics  . Smoking status: Current Every Day Smoker -- 0.25 packs/day for 20 years    Types: Cigarettes  . Smokeless tobacco: Never Used  . Alcohol Use: No   OB History   Grav Para Term Preterm Abortions TAB SAB Ect Mult Living                 Review of Systems  All other systems reviewed and are negative.     Allergies  Review of patient's allergies indicates no known  allergies.  Home Medications   Prior to Admission medications   Medication Sig Start Date End Date Taking? Authorizing Provider  acetaminophen (TYLENOL) 500 MG tablet Take 1,000 mg by mouth every 6 (six) hours as needed for moderate pain.   Yes Historical Provider, MD  aspirin 325 MG EC tablet Take 325 mg by mouth daily.   Yes Historical Provider, MD  ibuprofen (ADVIL,MOTRIN) 200 MG tablet Take 600 mg by mouth every 6 (six) hours as needed for moderate pain.   Yes Historical Provider, MD  lisinopril (PRINIVIL,ZESTRIL) 5 MG tablet Take 1 tablet (5 mg total) by mouth daily. 12/28/13  Yes Pricilla Larsson, NP  methocarbamol (ROBAXIN) 500 MG tablet Take 1 tablet (500 mg total) by mouth every 6 (six) hours as needed for muscle spasms (For Knee Pain/Spasms). 11/03/13  Yes Pricilla Larsson, NP  pantoprazole (PROTONIX) 40 MG tablet Take 1 tablet (40 mg total) by mouth daily. 12/28/13  Yes Pricilla Larsson, NP  HYDROcodone-acetaminophen (NORCO/VICODIN) 5-325 MG per tablet Take 1-2 tablets by mouth every 4 (four) hours as needed. 05/13/14   Wille Glaser  Tomi Bamberger, MD   BP 164/91  Pulse 78  Temp(Src) 98.2 F (36.8 C) (Oral)  Resp 16  SpO2 97% Physical Exam  Nursing note and vitals reviewed. Constitutional: She appears well-developed and well-nourished. No distress.  Obese   HENT:  Head: Normocephalic and atraumatic.  Right Ear: External ear normal.  Left Ear: External ear normal.  Eyes: Conjunctivae are normal. Right eye exhibits no discharge. Left eye exhibits no discharge. No scleral icterus.  Neck: Neck supple. No tracheal deviation present.  Cardiovascular: Normal rate, regular rhythm and intact distal pulses.   Pulmonary/Chest: Effort normal and breath sounds normal. No stridor. No respiratory distress. She has no wheezes. She has no rales.  Abdominal: Soft. Bowel sounds are normal. She exhibits no distension. There is no tenderness. There is no rebound and no guarding.  Musculoskeletal:       Right knee: She  exhibits swelling. She exhibits no effusion. Tenderness found.       Right lower leg: She exhibits swelling and edema. She exhibits no bony tenderness, no deformity and no laceration.  Neurological: She is alert. She has normal strength. No cranial nerve deficit (no facial droop, extraocular movements intact, no slurred speech) or sensory deficit. She exhibits normal muscle tone. She displays no seizure activity. Coordination normal.  Skin: Skin is warm and dry. No rash noted.  Psychiatric: She has a normal mood and affect.    ED Course  Procedures (including critical care time) Labs Review Labs Reviewed  BASIC METABOLIC PANEL - Abnormal; Notable for the following:    Glucose, Bld 127 (*)    GFR calc non Af Amer 75 (*)    GFR calc Af Amer 86 (*)    All other components within normal limits  CBC WITH DIFFERENTIAL  PRO B NATRIURETIC PEPTIDE  I-STAT TROPOININ, ED    Imaging Review Dg Chest 2 View  05/13/2014   CLINICAL DATA:  Shortness of breath; history of bronchitis, tobacco use, and hypertension.  EXAM: CHEST  2 VIEW  COMPARISON:  PA and lateral chest x-ray of August 29, 2013.  FINDINGS: The lungs are adequately inflated. The interstitial markings are mildly increased bilaterally but stable. The heart and pulmonary vascularity are within the limits of normal. There is no pleural effusion or pneumothorax. The mediastinum is normal in width. The bony thorax is unremarkable.  IMPRESSION: There are mild stable changes of chronic bronchitis. There is no evidence of pneumonia nor CHF.   Electronically Signed   By: David  Martinique   On: 05/13/2014 09:34   Dg Knee Complete 4 Views Right  05/13/2014   CLINICAL DATA:  Right knee pain. No reported injury. Previous knee surgery.  EXAM: RIGHT KNEE - COMPLETE 4+ VIEW  COMPARISON:  None.  FINDINGS: Marked disc space narrowing and large spurs involving all 3 joint compartments. Small to moderate-sized effusion. There is lateral subluxation of the tibia  relative to the femur. No fracture seen.  IMPRESSION: 1. Severe tricompartmental degenerative changes. 2. Small to moderate-sized effusion.   Electronically Signed   By: Enrique Sack M.D.   On: 05/13/2014 08:12     EKG Interpretation   Date/Time:  Saturday May 13 2014 09:04:49 EDT Ventricular Rate:  71 PR Interval:  125 QRS Duration: 78 QT Interval:  388 QTC Calculation: 422 R Axis:   37 Text Interpretation:  Sinus rhythm Repol abnrm suggests ischemia, diffuse  leads. slight increase in  lateral changes since prior tracing Minimal ST  elevation, anterior leads , similar  appearance to prior EKG Confirmed by  Carlus Stay  MD-J, Kishaun Erekson ([id number]) on 05/13/2014 9:12:53 AM      MDM   Final diagnoses:  Osteoarthritis of right knee, unspecified osteoarthritis type    Pt with severe OA.  No signs of dvt on the doppler study.  Doubt PE.  No CHF or pna on cxr. Will dc home with pain meds.  Follow up with ortho    Dorie Rank, MD 05/13/14 1113

## 2014-05-13 NOTE — Discharge Instructions (Signed)

## 2014-05-13 NOTE — ED Notes (Signed)
Pt has swelling to right knee. She reports she has osteoarthritis. No recent injury to precipitate pain or additional swelling. She mentions that she needs a total knee replacement. She has been taking motrin, tylenol, and aleve with out relief.

## 2014-05-13 NOTE — Progress Notes (Signed)
VASCULAR LAB PRELIMINARY  PRELIMINARY  PRELIMINARY  PRELIMINARY  Right lower extremity venous duplex completed.    Preliminary report:  Right:  No evidence of DVT, superficial thrombosis, or Baker's cyst.  Tiffany [name], RVS 05/13/2014, 10:49 AM

## 2014-05-13 NOTE — ED Notes (Signed)
Pt complains of R knee pain for past week, denies injury. R knee more swollen than L. Pt ambulatory with walker. Had knee replacement 25 years ago on R knee. Pt states pain also in lower leg. No recent history of travel/immobilization.

## 2014-05-29 ENCOUNTER — Emergency Department (HOSPITAL_COMMUNITY): Payer: Medicare Other

## 2014-05-29 ENCOUNTER — Emergency Department (HOSPITAL_COMMUNITY)
Admission: EM | Admit: 2014-05-29 | Discharge: 2014-05-29 | Disposition: A | Discharge status: Home / Self Care | Payer: Medicare Other | Attending: Emergency Medicine | Admitting: Emergency Medicine

## 2014-05-29 ENCOUNTER — Encounter (HOSPITAL_COMMUNITY): Payer: Self-pay | Admitting: Emergency Medicine

## 2014-05-29 DIAGNOSIS — R079 Chest pain, unspecified: Secondary | ICD-10-CM

## 2014-05-29 DIAGNOSIS — I10 Essential (primary) hypertension: Secondary | ICD-10-CM | POA: Diagnosis not present

## 2014-05-29 DIAGNOSIS — M1990 Unspecified osteoarthritis, unspecified site: Secondary | ICD-10-CM | POA: Diagnosis not present

## 2014-05-29 DIAGNOSIS — J984 Other disorders of lung: Secondary | ICD-10-CM | POA: Diagnosis not present

## 2014-05-29 DIAGNOSIS — R0602 Shortness of breath: Secondary | ICD-10-CM | POA: Diagnosis not present

## 2014-05-29 DIAGNOSIS — R2231 Localized swelling, mass and lump, right upper limb: Secondary | ICD-10-CM | POA: Diagnosis not present

## 2014-05-29 DIAGNOSIS — M19031 Primary osteoarthritis, right wrist: Secondary | ICD-10-CM | POA: Diagnosis not present

## 2014-05-29 DIAGNOSIS — Z7982 Long term (current) use of aspirin: Secondary | ICD-10-CM | POA: Insufficient documentation

## 2014-05-29 DIAGNOSIS — F419 Anxiety disorder, unspecified: Secondary | ICD-10-CM | POA: Diagnosis not present

## 2014-05-29 DIAGNOSIS — Z8709 Personal history of other diseases of the respiratory system: Secondary | ICD-10-CM | POA: Diagnosis not present

## 2014-05-29 DIAGNOSIS — Z720 Tobacco use: Secondary | ICD-10-CM | POA: Insufficient documentation

## 2014-05-29 DIAGNOSIS — Z79899 Other long term (current) drug therapy: Secondary | ICD-10-CM | POA: Diagnosis not present

## 2014-05-29 DIAGNOSIS — K219 Gastro-esophageal reflux disease without esophagitis: Secondary | ICD-10-CM | POA: Insufficient documentation

## 2014-05-29 DIAGNOSIS — M25531 Pain in right wrist: Secondary | ICD-10-CM | POA: Insufficient documentation

## 2014-05-29 DIAGNOSIS — M7989 Other specified soft tissue disorders: Secondary | ICD-10-CM | POA: Diagnosis not present

## 2014-05-29 LAB — I-STAT CHEM 8, ED
BUN: 10 mg/dL (ref 6–23)
CALCIUM ION: 1.14 mmol/L (ref 1.13–1.30)
CHLORIDE: 106 meq/L (ref 96–112)
CREATININE: 0.8 mg/dL (ref 0.50–1.10)
GLUCOSE: 129 mg/dL — AB (ref 70–99)
HEMATOCRIT: 44 % (ref 36.0–46.0)
Hemoglobin: 15 g/dL (ref 12.0–15.0)
POTASSIUM: 3.4 meq/L — AB (ref 3.7–5.3)
Sodium: 143 mEq/L (ref 137–147)
TCO2: 24 mmol/L (ref 0–100)

## 2014-05-29 LAB — I-STAT TROPONIN, ED: TROPONIN I, POC: 0 ng/mL (ref 0.00–0.08)

## 2014-05-29 MED ORDER — LORAZEPAM 1 MG PO TABS: 1.0000 mg | ORAL_TABLET | Freq: Three times a day (TID) | ORAL | Status: DC | PRN

## 2014-05-29 NOTE — ED Provider Notes (Signed)
Medical screening examination/treatment/procedure(s) were conducted as a shared visit with non-physician practitioner(s) and myself.  I personally evaluated the patient during the encounter.   EKG Interpretation   Date/Time:  Monday May 29 2014 16:02:58 EDT Ventricular Rate:  82 PR Interval:  133 QRS Duration: 80 QT Interval:  396 QTC Calculation: 462 R Axis:   25 Text Interpretation:  Sinus rhythm Baseline wander in lead(s) I III aVL V1  V2 No significant change was found Confirmed by [name] [phone]) on  05/29/2014 4:32:05 PM       Briefly, pt is a 63 y.o. female presenting with anxiety and situational dyspnea in setting of extreme emotional stressors.  I performed an examination on the patient including cardiac, pulmonary, and gi systems which were unremarkable.  Labwork and ECG unremarkable.  Likely recurrent anxiety.  DC home in stable condition.     [name], MD 05/29/14 [phone]

## 2014-05-29 NOTE — ED Notes (Signed)
Pt sates she has had anxiety in the past and it as recently flared back up and not able to get in under control with deep breaths

## 2014-05-29 NOTE — ED Provider Notes (Signed)
CSN: [id number]     Arrival date & time 05/29/14  1448 History  This chart was scribed for non-physician practitioner, Quincy Carnes, PA-C working with [name], MD by Frederich Balding, ED scribe. This patient was seen in room [place] and the patient's care was started at 3:30 PM.    Chief Complaint  Patient presents with  . Anxiety   The history is provided by the patient. No language interpreter was used.   HPI Comments: Tiffany [name] is a 63 y.o. female who presents to the Emergency Department complaining of intermittent anxiety flare ups over the last 2 weeks. Over the past month she has had 3 deaths in her family and is now the sole caretaker of her grandson.  States when she feels her anxiety, her palms get sweats with SOB as well as minor chest pain. Episodes normally last 5-6 minutes. She states there are no specific triggers. Reports anxiety in the past but states she can't get it under control with deep breathing now as she had in the past. Pt is also complaining of right hand pain that radiates into her wrist. Denies injury. Certain movements worsen the pain. Denies history of heart problems.  Patient is a daily smoker.  Denies SI/HI/AVH.  Denies EtOH or illicit drug use.  Patient not currently on any medications for her anxiety.  VS stable on arrival.  Past Medical History  Diagnosis Date  . Arthritis     osteoarthritis  . Hx of bronchitis   . Hypertension   . GERD (gastroesophageal reflux disease)    Past Surgical History  Procedure Laterality Date  . Knee surgery      arthroscopic right  . Toe nail removal    . 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 Heart disease Father     Died of CHF age 48  . Heart disease Mother     Died of CHF age 33  . Cancer Brother     Cancer  . Heart  failure Brother     Died of CHF   . High blood pressure Son   . Colon cancer Neg Hx    History  Substance Use Topics  . Smoking status: Current Every Day Smoker -- 0.25 packs/day for 20 years    Types: Cigarettes  . Smokeless tobacco: Never Used  . Alcohol Use: No   OB History   Grav Para Term Preterm Abortions TAB SAB Ect Mult Living                 Review of Systems  Cardiovascular: Negative for chest pain.  Psychiatric/Behavioral: The patient is nervous/anxious.   All other systems reviewed and are negative.  Allergies  Review of patient's allergies indicates no known allergies.  Home Medications   Prior to Admission medications   Medication Sig Start Date End Date Taking? Authorizing Provider  acetaminophen (TYLENOL) 500 MG tablet Take 1,000 mg by mouth every 6 (six) hours as needed for moderate pain.   Yes Historical Provider, MD  aspirin 325 MG EC tablet Take 325 mg by mouth daily.   Yes Historical Provider, MD  ibuprofen (ADVIL,MOTRIN) 200 MG tablet Take 600 mg by mouth every 6 (six) hours as needed for moderate pain.   Yes Historical Provider, MD  lisinopril (PRINIVIL,ZESTRIL) 5  MG tablet Take 1 tablet (5 mg total) by mouth daily. 12/28/13  Yes Lauree Chandler, NP  methocarbamol (ROBAXIN) 500 MG tablet Take 1 tablet (500 mg total) by mouth every 6 (six) hours as needed for muscle spasms (For Knee Pain/Spasms). 11/03/13  Yes Lauree Chandler, NP  pantoprazole (PROTONIX) 40 MG tablet Take 1 tablet (40 mg total) by mouth daily. 12/28/13  Yes Lauree Chandler, NP   BP 160/94  Pulse 91  Temp(Src) 98.3 F (36.8 C) (Oral)  Resp 20  SpO2 96%  Physical Exam  Nursing note and vitals reviewed. Constitutional: She is oriented to person, place, and time. She appears well-developed and well-nourished. No distress.  HENT:  Head: Normocephalic and atraumatic.  Mouth/Throat: Oropharynx is clear and moist.  Eyes: Conjunctivae and EOM are normal. Pupils are equal, round, and  reactive to light.  Neck: Normal range of motion. Neck supple.  Cardiovascular: Normal rate, regular rhythm and normal heart sounds.   Pulmonary/Chest: Effort normal and breath sounds normal. No respiratory distress. She has no wheezes.  Abdominal: Soft. Bowel sounds are normal.  Musculoskeletal: Normal range of motion.       Right wrist: She exhibits swelling.  Mild swelling along dorsal aspect of right wrist; no bony deformities; full ROM maintained- some pain with flexion; hand remains NVI  Neurological: She is alert and oriented to person, place, and time.  Skin: Skin is warm and dry. She is not diaphoretic.  Psychiatric: She has a normal mood and affect. She is not actively hallucinating. She expresses no homicidal and no suicidal ideation. She expresses no suicidal plans and no homicidal plans.    ED Course  ORTHOPEDIC INJURY TREATMENT Date/Time: 05/29/2014 5:24 PM Performed by: Larene Pickett Authorized by: Larene Pickett Consent: Verbal consent obtained. Risks and benefits: risks, benefits and alternatives were discussed Consent given by: patient Patient understanding: patient states understanding of the procedure being performed Patient identity confirmed: verbally with patient Injury location: wrist Location details: right wrist Injury type: soft tissue Pre-procedure neurovascular assessment: neurovascularly intact Immobilization: brace Splint type: volar short arm Supplies used: elastic bandage Post-procedure neurovascular assessment: post-procedure neurovascularly intact   (including critical care time)  DIAGNOSTIC STUDIES: Oxygen Saturation is 96% on RA, normal by my interpretation.    COORDINATION OF CARE: 3:33 PM-Discussed treatment plan which includes lab work, EKG and xray with pt at bedside and pt agreed to plan.   Labs Review Labs Reviewed  I-STAT CHEM 8, ED - Abnormal; Notable for the following:    Potassium 3.4 (*)    Glucose, Bld 129 (*)    All  other components within normal limits  I-STAT TROPOININ, ED    Imaging Review Dg Chest 2 View  05/29/2014   CLINICAL DATA:  Intermittent anxiety for 2 weeks. Diaphoresis and shortness of breath. Initial encounter.  EXAM: CHEST  2 VIEW  COMPARISON:  Radiographs 05/13/2014.  CT 08/29/2013.  FINDINGS: Mild cardiomegaly and vascular congestion appear stable. There is no confluent airspace opacity, edema or significant pleural effusion. There is stable linear scarring or atelectasis within the lingula, best seen on the lateral view. Mild diffuse central airway thickening is present. The osseous structures appear unchanged.  IMPRESSION: Stable mild chronic lung disease.  No acute cardiopulmonary process.   Electronically Signed   By: Camie Patience M.D.   On: 05/29/2014 16:16   Dg Wrist Complete Right  05/29/2014   CLINICAL DATA:  Right hand pain that radiates into wrist. No injury.  Initial evaluation.  EXAM: RIGHT WRIST - COMPLETE 3+ VIEW  COMPARISON:  None.  FINDINGS: Diffuse degenerative changes present. Degenerative changes are most severe at the first carpometacarpal joint and the radiocarpal joint. No evidence of fracture or dislocation.  IMPRESSION: Severe degenerative changes right wrist particularly prominent at the first carpometacarpal and radiocarpal joint spaces. No acute abnormality.   Electronically Signed   By: Marcello Moores  Register   On: 05/29/2014 16:16     EKG Interpretation   Date/Time:  Monday May 29 2014 16:02:58 EDT Ventricular Rate:  82 PR Interval:  133 QRS Duration: 80 QT Interval:  396 QTC Calculation: 462 R Axis:   25 Text Interpretation:  Sinus rhythm Baseline wander in lead(s) I III aVL V1  V2 No significant change was found Confirmed by [name] [phone]) on  05/29/2014 4:32:05 PM      MDM   Final diagnoses:  Chest pain, unspecified chest pain type  Shortness of breath  Anxiety   63 year old female with increased anxiety over the past 2 weeks due to recent  death in family. She denies any SI, HI, or AVH.  On exam, patient is in no acute distress. She does have some mild swelling to her dorsal right wrist without noted bony deformities.  Given patient's age and symptoms, will obtain basic labs, EKG, chest x-ray.  EKG sinus rhythm without acute ischemic change. Troponin is negative.  Chest x-ray clear. Right wrist films negative for acute findings-- was wrapped with ace bandage.  Given negative cardiac work up today and recent life stressors, symptoms are most likely due to anxiety. Low suspicion for ACS, PE, dissection, or other complaints at this time. The prescribed short supply of Ativan and encouraged close PCP followup.  Discussed plan with patient, he/she acknowledged understanding and agreed with plan of care.  Return precautions given for new or worsening symptoms.  Case discussed with attending physician, Dr. Colin Rhein, who personally evaluated patient and agrees with plan of care.  I personally performed the services described in this documentation, which was scribed in my presence. The recorded information has been reviewed and is accurate.  Larene Pickett, PA-C 05/29/14 1725

## 2014-05-29 NOTE — Discharge Instructions (Signed)
Take the prescribed medication as directed. Follow-up with your primary care physician and notify of medication changes. Return to the ED for new or worsening symptoms.

## 2014-06-13 ENCOUNTER — Other Ambulatory Visit: Payer: Medicare Other

## 2014-06-15 ENCOUNTER — Ambulatory Visit (INDEPENDENT_AMBULATORY_CARE_PROVIDER_SITE_OTHER): Payer: Medicare Other | Admitting: Nurse Practitioner

## 2014-06-15 VITALS — BP 170/100 | HR 71 | Temp 100.1°F | Ht 60.0 in | Wt 233.0 lb

## 2014-06-15 DIAGNOSIS — Z720 Tobacco use: Secondary | ICD-10-CM | POA: Diagnosis not present

## 2014-06-15 DIAGNOSIS — G8929 Other chronic pain: Secondary | ICD-10-CM

## 2014-06-15 DIAGNOSIS — F411 Generalized anxiety disorder: Secondary | ICD-10-CM | POA: Diagnosis not present

## 2014-06-15 DIAGNOSIS — I10 Essential (primary) hypertension: Secondary | ICD-10-CM | POA: Diagnosis not present

## 2014-06-15 MED ORDER — ALPRAZOLAM 0.25 MG PO TABS: 0.2500 mg | ORAL_TABLET | Freq: Three times a day (TID) | ORAL | Status: DC | PRN

## 2014-06-15 MED ORDER — HYDROCODONE-ACETAMINOPHEN 5-325 MG PO TABS
1.0000 | ORAL_TABLET | Freq: Four times a day (QID) | ORAL | Status: DC | PRN
Start: 2014-06-15 — End: 2014-07-27

## 2014-06-15 MED ORDER — HYDROCODONE-ACETAMINOPHEN 5-325 MG PO TABS: 2.0000 | ORAL_TABLET | Freq: Four times a day (QID) | ORAL | Status: DC | PRN

## 2014-06-15 MED ORDER — DULOXETINE HCL 30 MG PO CPEP: ORAL_CAPSULE | ORAL | Status: DC

## 2014-06-15 NOTE — Progress Notes (Signed)
Patient ID: Tiffany [name], female   DOB: [birth year], 63 y.o.   MRN: [id number]    PCP: Lauree Chandler, NP  No Known Allergies  Chief Complaint  Patient presents with  . Acute Visit    Anxiety    HPI:  Patient is a 63 y.o. female seen in the office today for anxiety attacks, hypertension, and worsening pain.  Has had multiple deaths in the family. Grandson and daughters husband both died within a month of each other. Seeing her other grandsons dealing with the loss has been hard too. Has not had any counseling realizes that she needs to. Went to the ER due to anxiety attack with chest pain and shortness of breath, no acute findings. Emergency room prescribed ativan, she has taken all that was given. Ativan was not helping with anxiety attacks.   Has previously been following with orthopedics for pain management. Also had to go to hospital due to pain and was given given as needed norco RX but only with 12 tablet and pain has been bad and she needs refill. Has not followed up with ortho due to family situation. Has had left knee replacement and was planning to have right before everything happened.   Review of Systems:  Review of Systems  Constitutional: Negative for activity change, appetite change, fatigue and unexpected weight change.  HENT: Negative for congestion and hearing loss.   Eyes: Negative.   Respiratory: Negative for cough and shortness of breath.   Cardiovascular: Negative for chest pain, palpitations and leg swelling.  Gastrointestinal: Negative for abdominal pain, diarrhea and constipation.  Genitourinary: Negative for dysuria and difficulty urinating.  Musculoskeletal: Positive for arthralgias and myalgias.  Skin: Negative for color change and wound.  Neurological: Negative for dizziness and weakness.  Psychiatric/Behavioral: Negative for behavioral problems, confusion and agitation. The patient is nervous/anxious.     Past Medical History  Diagnosis Date  .  Arthritis     osteoarthritis  . Hx of bronchitis   . Hypertension   . GERD (gastroesophageal reflux disease)    Past Surgical History  Procedure Laterality Date  . Knee surgery      arthroscopic right  . Toe nail removal    . 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  She has a 5 pack-year smoking history. She has never used smokeless tobacco. She reports that she does not drink alcohol or use illicit drugs.  Family History  Problem Relation Age of Onset  . Heart disease Father     Died of CHF age 6  . Heart disease Mother     Died of CHF age 32  . Cancer Brother     Cancer  . Heart failure Brother     Died of CHF   . High blood pressure Son   . Colon cancer Neg Hx     Medications: Patient's Medications  New Prescriptions   No medications on file  Previous Medications   ACETAMINOPHEN (TYLENOL) 500 MG TABLET    Take 1,000 mg by mouth every 6 (six) hours as needed for moderate pain.   ASPIRIN 325 MG EC TABLET    Take 325 mg by mouth daily.   HYDROCODONE-ACETAMINOPHEN (NORCO/VICODIN) 5-325 MG PER TABLET    Take 2 tablets by mouth every 4 (four)  hours as needed for moderate pain.   IBUPROFEN (ADVIL,MOTRIN) 200 MG TABLET    Take 600 mg by mouth every 6 (six) hours as needed for moderate pain.   LISINOPRIL (PRINIVIL,ZESTRIL) 5 MG TABLET    Take 1 tablet (5 mg total) by mouth daily.   LORAZEPAM (ATIVAN) 1 MG TABLET    Take 1 tablet (1 mg total) by mouth 3 (three) times daily as needed for anxiety.   METHOCARBAMOL (ROBAXIN) 500 MG TABLET    Take 1 tablet (500 mg total) by mouth every 6 (six) hours as needed for muscle spasms (For Knee Pain/Spasms).   PANTOPRAZOLE (PROTONIX) 40 MG TABLET    Take 1 tablet (40 mg total) by mouth daily.  Modified Medications   No medications on file    Discontinued Medications   No medications on file     Physical Exam: Filed Vitals:   06/15/14 1525  BP: 170/100  Pulse: 71  Temp: 100.1 F (37.8 C)  TempSrc: Oral  Height: 5' (1.524 m)  Weight: 233 lb (105.688 kg)  SpO2: 95%    Physical Exam  Constitutional: She is oriented to person, place, and time. She appears well-developed and well-nourished. No distress.  HENT:  Head: Normocephalic and atraumatic.  Mouth/Throat: Oropharynx is clear and moist. No oropharyngeal exudate.  Eyes: Conjunctivae are normal. Pupils are equal, round, and reactive to light.  Neck: Normal range of motion. Neck supple.  Cardiovascular: Normal rate, regular rhythm and normal heart sounds.   Pulmonary/Chest: Effort normal and breath sounds normal.  Abdominal: Soft. Bowel sounds are normal.  Musculoskeletal: She exhibits tenderness (to lumbar spine). She exhibits no edema.  Right knee with crepitus  Neurological: She is alert and oriented to person, place, and time.  Skin: Skin is warm and dry. She is not diaphoretic.  Psychiatric: Her mood appears anxious.  Tearful during exam     Labs reviewed: Basic Metabolic Panel:  Recent Labs  12/28/13 1209 03/07/14 1240 05/13/14 0911 05/29/14 1611  NA 144 142 137 143  K 3.7 4.2 4.2 3.4*  CL 102 104 101 106  CO2 25 24 23   --   GLUCOSE 111* 104* 127* 129*  BUN 16 15 17 10   CREATININE 1.00 0.79 0.82 0.80  CALCIUM 9.2 9.1 9.0  --    Liver Function Tests:  Recent Labs  08/30/13 0226 12/28/13 1209  AST 19 15  ALT 17 12  ALKPHOS 141* 134*  BILITOT <0.2* 0.3  PROT 7.2 7.3  ALBUMIN 3.0*  --    No results found for this basename: LIPASE, AMYLASE,  in the last 8760 hours No results found for this basename: AMMONIA,  in the last 8760 hours CBC:  Recent Labs  12/28/13 1209 03/07/14 1240 05/13/14 0911 05/29/14 1611  WBC 5.9 5.2 7.7  --   NEUTROABS 3.8 3.4 5.7  --   HGB 12.5 12.2 12.4 15.0  HCT 39.3 37.8 39.6 44.0  MCV 83 80.8 84.4   --   PLT 279 259 263  --    TSH:  Recent Labs  08/30/13 0850  TSH 1.479   A1C: Lab Results  Component Value Date   HGBA1C 6.9* 08/29/2013   Lipid Panel:  Recent Labs  08/29/13 2000 12/28/13 1209  CHOL 147  --   HDL 43 40  LDLCALC 81 55  TRIG 117 96  CHOLHDL 3.4 2.9     Assessment/Plan   1. Generalized anxiety disorder -worse due to situational/family issues -frequent anxiety attacks -planning  to go to counseling -will start cymbalta daily, and have xanax for as needed use for anxiety attacks  - DULoxetine (CYMBALTA) 30 MG capsule; Take 1 tablet daily for 1 week then increase to 2 tablets daily  Dispense: 60 capsule; Refill: 0 - ALPRAZolam (XANAX) 0.25 MG tablet; Take 1 tablet (0.25 mg total) by mouth 3 (three) times daily as needed for anxiety.  Dispense: 30 tablet; Refill: 0  2. Chronic pain -needs knee replacement, also with worsening back pain - HYDROcodone-acetaminophen (NORCO/VICODIN) 5-325 MG per tablet; Take 1-2 tablets by mouth every 6 (six) hours as needed for moderate pain.  Dispense: 120 tablet; Refill: 0  3. Tobacco abuse -has cut back, down to 2 cigarettes a day trying to cut back to one   4. Essential hypertension -improvement on re-take, pain and anxiety contributing   To keep follow up with Dr Bubba Camp for routine issues and anxiety

## 2014-06-15 NOTE — Patient Instructions (Signed)
Keep follow up with Dr Bubba Camp   Will start cymbatla for anxiety, may take up to a month to see improvement, call if you are having side effects with medication and can not tolerate   Make follow up appt with Ortho for pains

## 2014-07-04 ENCOUNTER — Ambulatory Visit: Payer: Medicare Other | Admitting: Internal Medicine

## 2014-07-11 ENCOUNTER — Telehealth: Payer: Self-pay | Admitting: *Deleted

## 2014-07-11 DIAGNOSIS — M179 Osteoarthritis of knee, unspecified: Secondary | ICD-10-CM | POA: Diagnosis not present

## 2014-07-11 DIAGNOSIS — M13169 Monoarthritis, not elsewhere classified, unspecified knee: Secondary | ICD-10-CM | POA: Diagnosis not present

## 2014-07-11 NOTE — Telephone Encounter (Signed)
Patient called and left message on voicemail that medication is not working. Tried calling patient and just get a fast busy signal. Have tried 3 times.

## 2014-07-24 ENCOUNTER — Inpatient Hospital Stay: Admission: RE | Admit: 2014-07-24 | Payer: Medicare Other | Source: Ambulatory Visit

## 2014-07-27 ENCOUNTER — Encounter: Payer: Self-pay | Admitting: Nurse Practitioner

## 2014-07-27 ENCOUNTER — Ambulatory Visit (INDEPENDENT_AMBULATORY_CARE_PROVIDER_SITE_OTHER): Payer: Medicare Other | Admitting: *Deleted

## 2014-07-27 ENCOUNTER — Ambulatory Visit (INDEPENDENT_AMBULATORY_CARE_PROVIDER_SITE_OTHER): Payer: Medicare Other | Admitting: Nurse Practitioner

## 2014-07-27 VITALS — BP 148/88 | HR 78 | Temp 97.4°F | Resp 20 | Ht 60.0 in | Wt 244.8 lb

## 2014-07-27 DIAGNOSIS — K219 Gastro-esophageal reflux disease without esophagitis: Secondary | ICD-10-CM | POA: Diagnosis not present

## 2014-07-27 DIAGNOSIS — F411 Generalized anxiety disorder: Secondary | ICD-10-CM | POA: Diagnosis not present

## 2014-07-27 DIAGNOSIS — G8929 Other chronic pain: Secondary | ICD-10-CM

## 2014-07-27 DIAGNOSIS — Z23 Encounter for immunization: Secondary | ICD-10-CM | POA: Diagnosis not present

## 2014-07-27 DIAGNOSIS — I10 Essential (primary) hypertension: Secondary | ICD-10-CM | POA: Diagnosis not present

## 2014-07-27 MED ORDER — SERTRALINE HCL 50 MG PO TABS: 50.0000 mg | ORAL_TABLET | Freq: Every day | ORAL | Status: DC

## 2014-07-27 MED ORDER — ALPRAZOLAM 0.25 MG PO TABS: 0.2500 mg | ORAL_TABLET | Freq: Three times a day (TID) | ORAL | Status: DC | PRN

## 2014-07-27 MED ORDER — OXYCODONE-ACETAMINOPHEN 10-325 MG PO TABS: 0.5000 | ORAL_TABLET | Freq: Four times a day (QID) | ORAL | Status: DC | PRN

## 2014-07-27 NOTE — Patient Instructions (Addendum)
Need to make and follow up with ortho before next appt  Follow up in 6 weeks with Dr Eulas Post   Make appt with councilor for anxiety

## 2014-07-27 NOTE — Progress Notes (Signed)
Patient ID: Tiffany [name], female   DOB: 09-27-1950, 63 y.o.   MRN: [id number]    PCP: Lauree Chandler, NP  Allergies  Allergen Reactions  . Cymbalta [Duloxetine Hcl] Other (See Comments)    Sweating, bad dreams,    Chief Complaint  Patient presents with  . Medical Management of Chronic Issues    medication issues, flu vaccine given     HPI: Patient is a 63 y.o. female seen in the office today to follow up on chronic conditions. Pt reports she was unable to tolerate Cymbalta due to more anxious with hallucination with cold sweat. Unsure of any other medications she has been taking regarding anxiety. Having increased anxiety and panic attacks still due to family concerns.   Has follow up with [facility] for follow up imagining.  Taking more pain pills than prescribed. throbbing pain in knee that is going down into foot. Also with shoulder pain into hand.  Has followed with Dr Alvan Dame in the past (has had left total knee, reports she needs to have right) plans to follow up with him soon but has not made an appt.  Taking blood pressure at home. Readings less than 150/90. Review of Systems:  Review of Systems  Constitutional: Negative for activity change, appetite change, fatigue and unexpected weight change.  HENT: Negative for congestion and hearing loss.   Eyes: Negative.   Respiratory: Negative for cough and shortness of breath.   Cardiovascular: Negative for chest pain, palpitations and leg swelling.  Gastrointestinal: Negative for abdominal pain, diarrhea and constipation.  Genitourinary: Negative for dysuria and difficulty urinating.  Musculoskeletal: Positive for myalgias and arthralgias (see HPI).  Skin: Negative for color change and wound.  Neurological: Negative for dizziness and weakness.  Psychiatric/Behavioral: Negative for behavioral problems, confusion and agitation. The patient is nervous/anxious.        Having anxiety and depression    Past Medical History    Diagnosis Date  . Arthritis     osteoarthritis  . Hx of bronchitis   . Hypertension   . GERD (gastroesophageal reflux disease)    Past Surgical History  Procedure Laterality Date  . Knee surgery      arthroscopic right  . Toe nail removal    . 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  She has a 5 pack-year smoking history. She has never used smokeless tobacco. She reports that she does not drink alcohol or use illicit drugs.  Family History  Problem Relation Age of Onset  . Heart disease Father     Died of CHF age 64  . Heart disease Mother     Died of CHF age 79  . Cancer Brother     Cancer  . Heart failure Brother     Died of CHF   . High blood pressure Son   . Colon cancer Neg Hx     Medications: Patient's Medications  New Prescriptions   No medications on file  Previous Medications   ACETAMINOPHEN (TYLENOL) 500 MG TABLET    Take 1,000 mg by mouth every 6 (six) hours as needed for moderate pain.   ALPRAZOLAM (XANAX) 0.25 MG TABLET    Take 1 tablet (0.25 mg total) by mouth 3 (three) times daily as needed for anxiety.  ASPIRIN 325 MG EC TABLET    Take 325 mg by mouth daily.   DULOXETINE (CYMBALTA) 30 MG CAPSULE    Take 1 tablet daily for 1 week then increase to 2 tablets daily   HYDROCODONE-ACETAMINOPHEN (NORCO/VICODIN) 5-325 MG PER TABLET    Take 1-2 tablets by mouth every 6 (six) hours as needed for moderate pain.   IBUPROFEN (ADVIL,MOTRIN) 200 MG TABLET    Take 600 mg by mouth every 6 (six) hours as needed for moderate pain.   LISINOPRIL (PRINIVIL,ZESTRIL) 5 MG TABLET    Take 1 tablet (5 mg total) by mouth daily.   METHOCARBAMOL (ROBAXIN) 500 MG TABLET    Take 1 tablet (500 mg total) by mouth every 6 (six) hours as needed for muscle spasms (For Knee Pain/Spasms).    PANTOPRAZOLE (PROTONIX) 40 MG TABLET    Take 1 tablet (40 mg total) by mouth daily.  Modified Medications   No medications on file  Discontinued Medications   No medications on file     Physical Exam:  Filed Vitals:   07/27/14 0936  BP: 148/88  Pulse: 78  Temp: 97.4 F (36.3 C)  TempSrc: Oral  Resp: 20  Height: 5' (1.524 m)  Weight: 244 lb 12.8 oz (111.041 kg)  SpO2: 98%    Physical Exam  Constitutional: She is oriented to person, place, and time. She appears well-developed and well-nourished. No distress.  HENT:  Head: Normocephalic and atraumatic.  Mouth/Throat: Oropharynx is clear and moist. No oropharyngeal exudate.  Eyes: Conjunctivae are normal. Pupils are equal, round, and reactive to light.  Neck: Normal range of motion. Neck supple.  Cardiovascular: Normal rate, regular rhythm and normal heart sounds.   Pulmonary/Chest: Effort normal and breath sounds normal.  Abdominal: Soft. Bowel sounds are normal.  Musculoskeletal: She exhibits no edema. Tenderness: with movement to right shoulder and right knee.  Right knee with crepitus  Neurological: She is alert and oriented to person, place, and time.  Skin: Skin is warm and dry. She is not diaphoretic.  Psychiatric: Her mood appears anxious.    Labs reviewed: Basic Metabolic Panel:  Recent Labs  08/30/13 0850 12/28/13 1209 03/07/14 1240 05/13/14 0911 05/29/14 1611  NA  --  144 142 137 143  K  --  3.7 4.2 4.2 3.4*  CL  --  102 104 101 106  CO2  --  25 24 23   --   GLUCOSE  --  111* 104* 127* 129*  BUN  --  16 15 17 10   CREATININE  --  1.00 0.79 0.82 0.80  CALCIUM  --  9.2 9.1 9.0  --   TSH 1.479  --   --   --   --    Liver Function Tests:  Recent Labs  08/30/13 0226 12/28/13 1209  AST 19 15  ALT 17 12  ALKPHOS 141* 134*  BILITOT <0.2* 0.3  PROT 7.2 7.3  ALBUMIN 3.0*  --    No results for input(s): LIPASE, AMYLASE in the last 8760 hours. No results for input(s): AMMONIA in the last 8760  hours. CBC:  Recent Labs  12/28/13 1209 03/07/14 1240 05/13/14 0911 05/29/14 1611  WBC 5.9 5.2 7.7  --   NEUTROABS 3.8 3.4 5.7  --   HGB 12.5 12.2 12.4 15.0  HCT 39.3 37.8 39.6 44.0  MCV 83 80.8 84.4  --   PLT 279 259 263  --    Lipid Panel:  Recent Labs  08/29/13 2000 12/28/13 1209  CHOL 147  --   HDL 43 40  LDLCALC 81 55  TRIG 117 96  CHOLHDL 3.4 2.9   TSH:  Recent Labs  08/30/13 0850  TSH 1.479   A1C: Lab Results  Component Value Date   HGBA1C 6.9* 08/29/2013     Assessment/Plan 1. Generalized anxiety disorder -uncontrolled, has not gone to therapist which was recommended.  -again encouraged pt to seek therapy along with medication  -Cymbalta DC'd and will place on zoloft  - ALPRAZolam (XANAX) 0.25 MG tablet; Take 1 tablet (0.25 mg total) by mouth 3 (three) times daily as needed for anxiety.  Dispense: 30 tablet; Refill: 0 - sertraline (ZOLOFT) 50 MG tablet; Take 1 tablet (50 mg total) by mouth daily.  Dispense: 30 tablet; Refill: 3  2. Chronic pain -pt to follow up with ortho due to worsening pain -dicussed importance of taking medication AS PRESCRIBED and reinforced the pain contract. If pt is not effective needs to follow up sooner - oxyCODONE-acetaminophen (PERCOCET) 10-325 MG per tablet; Take 0.5-1 tablets by mouth every 6 (six) hours as needed for pain.  Dispense: 90 tablet; Refill: 0  3. Gastroesophageal reflux disease without esophagitis -stable on protonix  4. Essential hypertension -controlled on current medications

## 2014-08-03 ENCOUNTER — Other Ambulatory Visit: Payer: Medicare Other

## 2014-08-06 ENCOUNTER — Emergency Department (HOSPITAL_COMMUNITY)
Admission: EM | Admit: 2014-08-06 | Discharge: 2014-08-06 | Disposition: A | Discharge status: Home / Self Care | Payer: Medicare Other | Attending: Emergency Medicine | Admitting: Emergency Medicine

## 2014-08-06 ENCOUNTER — Emergency Department (HOSPITAL_COMMUNITY): Payer: Medicare Other

## 2014-08-06 ENCOUNTER — Encounter (HOSPITAL_COMMUNITY): Payer: Self-pay | Admitting: Emergency Medicine

## 2014-08-06 DIAGNOSIS — M779 Enthesopathy, unspecified: Secondary | ICD-10-CM | POA: Diagnosis not present

## 2014-08-06 DIAGNOSIS — I10 Essential (primary) hypertension: Secondary | ICD-10-CM | POA: Diagnosis not present

## 2014-08-06 DIAGNOSIS — Z7982 Long term (current) use of aspirin: Secondary | ICD-10-CM | POA: Diagnosis not present

## 2014-08-06 DIAGNOSIS — Z79899 Other long term (current) drug therapy: Secondary | ICD-10-CM | POA: Diagnosis not present

## 2014-08-06 DIAGNOSIS — G8929 Other chronic pain: Secondary | ICD-10-CM

## 2014-08-06 DIAGNOSIS — Z791 Long term (current) use of non-steroidal anti-inflammatories (NSAID): Secondary | ICD-10-CM | POA: Insufficient documentation

## 2014-08-06 DIAGNOSIS — Z8709 Personal history of other diseases of the respiratory system: Secondary | ICD-10-CM | POA: Insufficient documentation

## 2014-08-06 DIAGNOSIS — M1990 Unspecified osteoarthritis, unspecified site: Secondary | ICD-10-CM | POA: Diagnosis not present

## 2014-08-06 DIAGNOSIS — Z720 Tobacco use: Secondary | ICD-10-CM | POA: Insufficient documentation

## 2014-08-06 DIAGNOSIS — M25511 Pain in right shoulder: Secondary | ICD-10-CM | POA: Diagnosis present

## 2014-08-06 DIAGNOSIS — K219 Gastro-esophageal reflux disease without esophagitis: Secondary | ICD-10-CM | POA: Insufficient documentation

## 2014-08-06 DIAGNOSIS — M67911 Unspecified disorder of synovium and tendon, right shoulder: Secondary | ICD-10-CM | POA: Diagnosis not present

## 2014-08-06 DIAGNOSIS — M19011 Primary osteoarthritis, right shoulder: Secondary | ICD-10-CM | POA: Diagnosis not present

## 2014-08-06 DIAGNOSIS — M7581 Other shoulder lesions, right shoulder: Secondary | ICD-10-CM

## 2014-08-06 MED ORDER — DIAZEPAM 5 MG PO TABS: 5.0000 mg | ORAL_TABLET | Freq: Two times a day (BID) | ORAL | Status: DC

## 2014-08-06 MED ORDER — DEXAMETHASONE SODIUM PHOSPHATE 10 MG/ML IJ SOLN
10.0000 mg | Freq: Once | INTRAMUSCULAR | Status: AC
  Administered 2014-08-06: 10 mg via INTRAMUSCULAR
  Filled 2014-08-06: qty 1

## 2014-08-06 MED ORDER — KETOROLAC TROMETHAMINE 60 MG/2ML IM SOLN
60.0000 mg | Freq: Once | INTRAMUSCULAR | Status: AC
  Administered 2014-08-06: 60 mg via INTRAMUSCULAR
  Filled 2014-08-06: qty 2

## 2014-08-06 MED ORDER — DICLOFENAC SODIUM 1 % TD GEL: 2.0000 g | Freq: Four times a day (QID) | TRANSDERMAL | Status: DC

## 2014-08-06 NOTE — ED Provider Notes (Signed)
CSN: [id number]     Arrival date & time 08/06/14  2138 History   First MD Initiated Contact with Patient 08/06/14 2213     This chart was scribed for non-physician practitioner, Antonietta Breach, PA-C working with Artis Delay, MD by Forrestine Him, ED Scribe. This patient was seen in room WTR5/WTR5 and the patient's care was started at 10:53 PM.  Chief Complaint  Patient presents with  . Shoulder Pain   The history is provided by the patient. No language interpreter was used.    HPI Comments: Tiffany [name] is a 63 y.o. female who presents to the Emergency Department complaining of constant R shoulder pain that radiates down the arm x 2 days that progressively worsened today while watching television. No recent trauma or injury to arm. She describes pain as throbbing/achying. Pain is exacerbated with movement and alleviated when elevated. She has tried heat application to the shoulder with mild improvement for symptoms. She has also tried muscle relaxants with mild relief. No trauma or injury to neck. Pt is followed by Dr. Bradly Chris and plans to schedule an appointment for February 2016 first available. No previous shoulder injuries. Pt with known allergy to Cymbalta.   Past Medical History  Diagnosis Date  . Arthritis     osteoarthritis  . Hx of bronchitis   . Hypertension   . GERD (gastroesophageal reflux disease)    Past Surgical History  Procedure Laterality Date  . Knee surgery      arthroscopic right  . Toe nail removal    . 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 Heart disease Father     Died of CHF age 5  . Heart disease Mother     Died of CHF age 85  . Cancer Brother     Cancer  . Heart failure Brother     Died of CHF   . High blood pressure Son   . Colon cancer Neg Hx    History   Substance Use Topics  . Smoking status: Current Every Day Smoker -- 0.25 packs/day for 20 years    Types: Cigarettes  . Smokeless tobacco: Never Used  . Alcohol Use: No   OB History    No data available      Review of Systems  Musculoskeletal: Positive for arthralgias.  Neurological: Negative for weakness and numbness.  All other systems reviewed and are negative.   Allergies  Cymbalta  Home Medications   Prior to Admission medications   Medication Sig Start Date End Date Taking? Authorizing Provider  acetaminophen (TYLENOL) 500 MG tablet Take 1,000 mg by mouth every 6 (six) hours as needed for moderate pain.    Historical Provider, MD  ALPRAZolam Duanne Moron) 0.25 MG tablet Take 1 tablet (0.25 mg total) by mouth 3 (three) times daily as needed for anxiety. 07/27/14   Lauree Chandler, NP  aspirin 325 MG EC tablet Take 325 mg by mouth daily.    Historical Provider, MD  diazepam (VALIUM) 5 MG tablet Take 1 tablet (5 mg total) by mouth 2 (two) times daily. 08/06/14   Antonietta Breach, PA-C  diclofenac sodium (VOLTAREN) 1 % GEL Apply 2 g topically 4 (four) times daily. 08/06/14   Antonietta Breach, PA-C  ibuprofen (ADVIL,MOTRIN) 200 MG tablet Take 600 mg by  mouth every 6 (six) hours as needed for moderate pain.    Historical Provider, MD  lisinopril (PRINIVIL,ZESTRIL) 5 MG tablet Take 1 tablet (5 mg total) by mouth daily. 12/28/13   Lauree Chandler, NP  methocarbamol (ROBAXIN) 500 MG tablet Take 1 tablet (500 mg total) by mouth every 6 (six) hours as needed for muscle spasms (For Knee Pain/Spasms). 11/03/13   Lauree Chandler, NP  oxyCODONE-acetaminophen (PERCOCET) 10-325 MG per tablet Take 0.5-1 tablets by mouth every 6 (six) hours as needed for pain. 07/27/14   Lauree Chandler, NP  pantoprazole (PROTONIX) 40 MG tablet Take 1 tablet (40 mg total) by mouth daily. 12/28/13   Lauree Chandler, NP  sertraline (ZOLOFT) 50 MG tablet Take 1 tablet (50 mg total) by mouth daily. 07/27/14   Lauree Chandler,  NP   Triage Vitals: BP 157/112 mmHg  Pulse 92  Temp(Src) 99 F (37.2 C) (Oral)  Resp 20  SpO2 99%   Physical Exam  Constitutional: She is oriented to person, place, and time. She appears well-developed and well-nourished. No distress.  Nontoxic/nonseptic appearing  HENT:  Head: Normocephalic and atraumatic.  Eyes: Conjunctivae and EOM are normal. No scleral icterus.  Neck: Normal range of motion.  No cervical midline or paraspinal muscle tenderness  Cardiovascular: Normal rate, regular rhythm and intact distal pulses.   Distal radial pulse 2+ in right upper extremity. Capillary refill brisk in all digits of right hand.  Pulmonary/Chest: Effort normal. No respiratory distress.  Chest expansion symmetric  Abdominal: She exhibits no distension.  Musculoskeletal: She exhibits tenderness.       Right shoulder: She exhibits decreased range of motion (decreased AROM secondary to pain), tenderness and pain. She exhibits no swelling, no effusion, no crepitus, no deformity, normal pulse and normal strength.       Arms: PROM of R shoulder to 90 degrees. TTP to anterior R shoulder joint. No crepitus, deformity, or effusion. No erythema or heat to touch.  Neurological: She is alert and oriented to person, place, and time. She exhibits normal muscle tone. Coordination normal.  Sensation to light touch intact. Normal grip strength in right upper extremity  Skin: Skin is warm and dry. No rash noted. She is not diaphoretic. No erythema. No pallor.  Psychiatric: She has a normal mood and affect. Her behavior is normal.  Nursing note and vitals reviewed.   ED Course  Procedures (including critical care time)  DIAGNOSTIC STUDIES: Oxygen Saturation is 99% on RA, Normal by my interpretation.    COORDINATION OF CARE: 11:00 PM- Will order DG Shoulder R. Will give Toradol and Decadron here in ED. Discussed treatment plan with pt at bedside and pt agreed to plan.     Labs Review Labs Reviewed - No  data to display  Imaging Review Dg Shoulder Right  08/06/2014   CLINICAL DATA:  Acute right shoulder pain without injury.  EXAM: RIGHT SHOULDER - 2+ VIEW  COMPARISON:  None.  FINDINGS: There is no evidence of acute fracture or dislocation. Probable old fracture is seen involving the posterior margin of the glenoid fossa. Mild degenerative joint disease of right glenohumeral and acromioclavicular joints is noted. Probable Hill-Sachs deformity seen involving lateral portion of right humeral head. Soft tissues are unremarkable.  IMPRESSION: Mild degenerative change of right glenohumeral and acromioclavicular joints. Probable Hill-Sachs deformity seen involving proximal right humeral head consistent with previous dislocations. No acute abnormality seen in the right shoulder.   Electronically Signed   By: Jeneen Rinks  Green M.D.   On: 08/06/2014 22:39     EKG Interpretation None      MDM   Final diagnoses:  Rotator cuff tendonitis, right    63 year old female with a history of right shoulder pain presents to the emergency department for pain which began 2 days ago. She states that this exacerbation feels worse than her prior exacerbations. She denies any history of shoulder dislocation. Patient is neurovascularly intact. No history of trauma or injury to the area. No associated neck pain or stiffness. No numbness/paresthesias, or extremity weakness. Physical exam findings consistent with likely tendinitis. Imaging does show mild degenerative change to the right glenohumeral and acromioclavicular joints. Symptoms to be managed as outpatient with Valium and Voltaren gel. Patient denies taking any pain medications, but did have 90 tablets of 10-325 Percocet filled 10 days ago. She also had 120 tablets of 10-325 Vicodin filled on 07/11/14. Will not give additional narcotics today. Orthopedic and PCP f/u advised. Return precautions provided. Patient discharged from the ED in good condition.  I personally  performed the services described in this documentation, which was scribed in my presence. The recorded information has been reviewed and is accurate.     Antonietta Breach, PA-C 08/06/14 2329  Artis Delay, MD 08/09/14 [phone]

## 2014-08-06 NOTE — ED Notes (Signed)
Pt arrived to the ED with a complaint of right shoulder pain.  Pt has a hx of osteoarthritis and has experienced pain previously.  Pt states she has taken "muscle spasm medication" and ran out today.  Pt states that the pain today is more intense than previously.  Pt states that it started in her shoulder and began radiating down to her hand.  Pt has swelling on her right hand.

## 2014-08-06 NOTE — Discharge Instructions (Signed)
Recommend alternating ice and heat to your shoulder 3-4 times per day. Also recommend that you use Voltaren gel and Valium as prescribed for pain and inflammation. Stretch your shoulder daily to prevent frozen shoulder. You may take Percocet, prescribed to you by your primary care doctor, as needed for pain control. Follow up with your primary care doctor and your orthopedist as needed.  Rotator Cuff Tendinitis  Rotator cuff tendinitis is inflammation of the tough, cord-like bands that connect muscle to bone (tendons) in your rotator cuff. Your rotator cuff is the collection of all the muscles and tendons that connect your arm to your shoulder. Your rotator cuff holds the head of your upper arm bone (humerus) in the cup (fossa) of your shoulder blade (scapula). CAUSES Rotator cuff tendinitis is usually caused by overusing the joint involved.  SIGNS AND SYMPTOMS  Deep ache in the shoulder also felt on the outside upper arm over the shoulder muscle.  Point tenderness over the area that is injured.  Pain comes on gradually and becomes worse with lifting the arm to the side (abduction) or turning it inward (internal rotation).  May lead to a chronic tear: When a rotator cuff tendon becomes inflamed, it runs the risk of losing its blood supply, causing some tendon fibers to die. This increases the risk that the tendon can fray and partially or completely tear. DIAGNOSIS Rotator cuff tendinitis is diagnosed by taking a medical history, performing a physical exam, and reviewing results of imaging exams. The medical history is useful to help determine the type of rotator cuff injury. The physical exam will include looking at the injured shoulder, feeling the injured area, and watching you do range-of-motion exercises. X-ray exams are typically done to rule out other causes of shoulder pain, such as fractures. MRI is the imaging exam usually used for significant shoulder injuries. Sometimes a dye study called  CT arthrogram is done, but it is not as widely used as MRI. In some institutions, special ultrasound tests may also be used to aid in the diagnosis. TREATMENT  Less Severe Cases  Use of a sling to rest the shoulder for a short period of time. Prolonged use of the sling can cause stiffness, weakness, and loss of motion of the shoulder joint.  Anti-inflammatory medicines, such as ibuprofen or naproxen sodium, may be prescribed. More Severe Cases  Physical therapy.  Use of steroid injections into the shoulder joint.  Surgery. HOME CARE INSTRUCTIONS   Use a sling or splint until the pain decreases. Prolonged use of the sling can cause stiffness, weakness, and loss of motion of the shoulder joint.  Apply ice to the injured area:  Put ice in a plastic bag.  Place a towel between your skin and the bag.  Leave the ice on for 20 minutes, 2-3 times a day.  Try to avoid use other than gentle range of motion while your shoulder is painful. Use the shoulder and exercise only as directed by your health care provider. Stop exercises or range of motion if pain or discomfort increases, unless directed otherwise by your health care provider.  Only take over-the-counter or prescription medicines for pain, discomfort, or fever as directed by your health care provider.  If you were given a shoulder sling and straps (immobilizer), do not remove it except as directed, or until you see a health care provider for a follow-up exam. If you need to remove it, move your arm as little as possible or as directed.  You  may want to sleep on several pillows at night to lessen swelling and pain. SEEK IMMEDIATE MEDICAL CARE IF:   Your shoulder pain increases or new pain develops in your arm, hand, or fingers and is not relieved with medicines.  You have new, unexplained symptoms, especially increased numbness in the hands or loss of strength.  You develop any worsening of the problems that brought you in for  care.  Your arm, hand, or fingers are numb or tingling.  Your arm, hand, or fingers are swollen, painful, or turn white or blue. MAKE SURE YOU:  Understand these instructions.  Will watch your condition.  Will get help right away if you are not doing well or get worse. Document Released: 11/01/2003 Document Revised: 06/01/2013 Document Reviewed: 03/23/2013 [facility] Patient Information 2015 [place], [place]. This information is not intended to replace advice given to you by your health care provider. Make sure you discuss any questions you have with your health care provider.

## 2014-08-14 DIAGNOSIS — M179 Osteoarthritis of knee, unspecified: Secondary | ICD-10-CM | POA: Diagnosis not present

## 2014-08-14 DIAGNOSIS — F17200 Nicotine dependence, unspecified, uncomplicated: Secondary | ICD-10-CM | POA: Diagnosis not present

## 2014-08-14 DIAGNOSIS — M13169 Monoarthritis, not elsewhere classified, unspecified knee: Secondary | ICD-10-CM | POA: Diagnosis not present

## 2014-08-23 ENCOUNTER — Encounter: Payer: Self-pay | Admitting: Internal Medicine

## 2014-08-23 ENCOUNTER — Ambulatory Visit (INDEPENDENT_AMBULATORY_CARE_PROVIDER_SITE_OTHER): Payer: Medicare Other | Admitting: Internal Medicine

## 2014-08-23 VITALS — BP 130/82 | HR 86 | Temp 98.7°F | Resp 12 | Wt 242.0 lb

## 2014-08-23 DIAGNOSIS — F411 Generalized anxiety disorder: Secondary | ICD-10-CM | POA: Diagnosis not present

## 2014-08-23 DIAGNOSIS — M170 Bilateral primary osteoarthritis of knee: Secondary | ICD-10-CM | POA: Diagnosis not present

## 2014-08-23 DIAGNOSIS — G8929 Other chronic pain: Secondary | ICD-10-CM

## 2014-08-23 DIAGNOSIS — I10 Essential (primary) hypertension: Secondary | ICD-10-CM | POA: Diagnosis not present

## 2014-08-23 DIAGNOSIS — Z720 Tobacco use: Secondary | ICD-10-CM | POA: Diagnosis not present

## 2014-08-23 MED ORDER — HYDROCODONE-ACETAMINOPHEN 10-325 MG PO TABS: 1.0000 | ORAL_TABLET | Freq: Four times a day (QID) | ORAL | Status: DC | PRN

## 2014-08-23 MED ORDER — ALPRAZOLAM 0.25 MG PO TABS: 0.2500 mg | ORAL_TABLET | Freq: Three times a day (TID) | ORAL | Status: DC | PRN

## 2014-08-23 MED ORDER — DIAZEPAM 5 MG PO TABS: 5.0000 mg | ORAL_TABLET | Freq: Two times a day (BID) | ORAL | Status: DC

## 2014-08-23 NOTE — Progress Notes (Signed)
Patient ID: Tiffany [name], female   DOB: 01/06/1951, 63 y.o.   MRN: [id number]     Facility  [facility]    Place of Service:   OFFICE   Allergies  Allergen Reactions  . Cymbalta [Duloxetine Hcl] Other (See Comments)    Sweating, bad dreams,    Chief Complaint  Patient presents with  . Follow-up    4 week follow-up on anxiety, renew xanax and valium   . Medication Management    Discuss changing Percocet- causes vomiting, patient discontinued.     HPI:   63 yo female seen today for a routine visit. She is c/a percocet as it caused N/V. She would like to go back to Norco. Current pain is 7/10 on scale. Anxiety improved on sertraline. No ADRs. Needs med RF.Takes alprazolam BID and valium prn. She takes valium at least once daily when she feels a panic attack. Enyoyed Christmas and no new stressors.  Medications: Patient's Medications  New Prescriptions   No medications on file  Previous Medications   ACETAMINOPHEN (TYLENOL) 500 MG TABLET    Take 1,000 mg by mouth every 6 (six) hours as needed for moderate pain.   ALPRAZOLAM (XANAX) 0.25 MG TABLET    Take 1 tablet (0.25 mg total) by mouth 3 (three) times daily as needed for anxiety.   ASPIRIN 325 MG EC TABLET    Take 325 mg by mouth daily.   DIAZEPAM (VALIUM) 5 MG TABLET    Take 1 tablet (5 mg total) by mouth 2 (two) times daily.   DICLOFENAC SODIUM (VOLTAREN) 1 % GEL    Apply 2 g topically 4 (four) times daily.   IBUPROFEN (ADVIL,MOTRIN) 200 MG TABLET    Take 600 mg by mouth every 6 (six) hours as needed for moderate pain.   LISINOPRIL (PRINIVIL,ZESTRIL) 5 MG TABLET    Take 1 tablet (5 mg total) by mouth daily.   METHOCARBAMOL (ROBAXIN) 500 MG TABLET    Take 1 tablet (500 mg total) by mouth every 6 (six) hours as needed for muscle spasms (For Knee Pain/Spasms).   OXYCODONE-ACETAMINOPHEN (PERCOCET) 10-325 MG PER TABLET    Take 0.5-1 tablets by mouth every 6 (six) hours as needed for pain.   PANTOPRAZOLE (PROTONIX) 40 MG TABLET    Take  1 tablet (40 mg total) by mouth daily.   SERTRALINE (ZOLOFT) 50 MG TABLET    Take 1 tablet (50 mg total) by mouth daily.  Modified Medications   No medications on file  Discontinued Medications   No medications on file     Review of Systems  Constitutional: Positive for fatigue. Negative for fever, chills, diaphoresis, activity change and appetite change.  HENT: Negative for ear pain and sore throat.   Eyes: Negative for visual disturbance.  Respiratory: Negative for cough, chest tightness and shortness of breath.   Cardiovascular: Negative for chest pain, palpitations and leg swelling.  Gastrointestinal: Positive for nausea and vomiting. Negative for abdominal pain, diarrhea, constipation and blood in stool.  Genitourinary: Negative for dysuria.  Musculoskeletal: Positive for joint swelling and arthralgias.  Neurological: Positive for numbness (right fingers). Negative for dizziness, tremors and headaches.  Psychiatric/Behavioral: Negative for sleep disturbance. The patient is nervous/anxious.     Filed Vitals:   08/23/14 0815  BP: 130/82  Pulse: 86  Temp: 98.7 F (37.1 C)  TempSrc: Oral  Resp: 12  Weight: 242 lb (109.77 kg)  SpO2: 91%   Body mass index is 47.26 kg/(m^2).  Physical Exam  CONSTITUTIONAL: Looks well in NAD. Awake, alert and oriented x 3 HEENT: PERRLA. Oropharynx clear and without exudate NECK: Supple. Nontender. No palpable cervical or supraclavicular lymph nodes. No carotid bruit b/l. No thyromegaly or thyroid mass palpable.  CVS: Regular rate without murmur, gallop or rub. LUNGS: CTA b/l no wheezing, rales or rhonchi. ABDOMEN: Bowel sounds present x 4. Soft, nontender, nondistended. No palpable mass or bruit EXTREMITIES: Trace LE edema b/l. Distal pulses palpable. No calf tenderness MUSC: right knee swollen with reduced ROM; antalgic gait PSYCH: Nervous   Labs reviewed: Admission on 05/29/2014, Discharged on 05/29/2014  Component Date Value Ref Range  Status  . Troponin i, poc 05/29/2014 0.00  0.00 - 0.08 ng/mL Final  . Comment 3 05/29/2014          Final   Comment: Due to the release kinetics of cTnI,                          a negative result within the first hours                          of the onset of symptoms does not rule out                          myocardial infarction with certainty.                          If myocardial infarction is still suspected,                          repeat the test at appropriate intervals.  . Sodium 05/29/2014 143  137 - 147 mEq/L Final  . Potassium 05/29/2014 3.4* 3.7 - 5.3 mEq/L Final  . Chloride 05/29/2014 106  96 - 112 mEq/L Final  . BUN 05/29/2014 10  6 - 23 mg/dL Final  . Creatinine, Ser 05/29/2014 0.80  0.50 - 1.10 mg/dL Final  . Glucose, Bld 05/29/2014 129* 70 - 99 mg/dL Final  . Calcium, Ion 05/29/2014 1.14  1.13 - 1.30 mmol/L Final  . TCO2 05/29/2014 24  0 - 100 mmol/L Final  . Hemoglobin 05/29/2014 15.0  12.0 - 15.0 g/dL Final  . HCT 05/29/2014 44.0  36.0 - 46.0 % Final     Assessment/Plan    ICD-9-CM ICD-10-CM   1. Generalized anxiety disorder - improved 300.02 F41.1 ALPRAZolam (XANAX) 0.25 MG tablet     CMP  2. Chronic pain 338.29 G89.29   3. Primary osteoarthritis of both knees - s/p left TKR 715.16 M17.0   4. Essential hypertension 401.9 I10 CMP  5. Tobacco abuse 305.1 Z72.0     - d/c percocet due to ADRs (n/v)  - Rx norco 10/325 take 1-2 tabs po q6h prn  - New Rx written for 30 day supply of alprazolam and diazepam  - smoking cessation discussed and highly urged. She is down to 2-3 cigs/day  - check CMP  - f/u with Ortho for right knee OA  - f/u with Sherrie Mustache in 2 mos   [place]. Perlie Gold  [facility] [address] [place], [place] [address] [phone] Office (Wednesdays and Fridays 8 AM - 5 PM) [phone] Cell (Monday-Friday 8 AM - 5 PM)

## 2014-08-23 NOTE — Patient Instructions (Signed)
Follow up with Orthopedics for right knee

## 2014-08-24 LAB — COMPREHENSIVE METABOLIC PANEL
ALBUMIN: 4.1 g/dL (ref 3.6–4.8)
ALT: 17 IU/L (ref 0–32)
AST: 12 IU/L (ref 0–40)
Albumin/Globulin Ratio: 1.3 (ref 1.1–2.5)
Alkaline Phosphatase: 126 IU/L — ABNORMAL HIGH (ref 39–117)
BUN/Creatinine Ratio: 10 — ABNORMAL LOW (ref 11–26)
BUN: 8 mg/dL (ref 8–27)
CALCIUM: 9.1 mg/dL (ref 8.7–10.3)
CHLORIDE: 102 mmol/L (ref 97–108)
CO2: 23 mmol/L (ref 18–29)
CREATININE: 0.84 mg/dL (ref 0.57–1.00)
GFR calc Af Amer: 86 mL/min/{1.73_m2} (ref >59)
GFR, EST NON AFRICAN AMERICAN: 74 mL/min/{1.73_m2} (ref >59)
Globulin, Total: 3.2 g/dL (ref 1.5–4.5)
Glucose: 144 mg/dL — ABNORMAL HIGH (ref 65–99)
POTASSIUM: 4.2 mmol/L (ref 3.5–5.2)
Sodium: 141 mmol/L (ref 134–144)
Total Bilirubin: 0.4 mg/dL (ref 0.0–1.2)
Total Protein: 7.3 g/dL (ref 6.0–8.5)

## 2014-08-30 ENCOUNTER — Other Ambulatory Visit: Payer: Medicare Other

## 2014-09-19 ENCOUNTER — Other Ambulatory Visit: Payer: Medicare Other

## 2014-09-26 DIAGNOSIS — G4483 Primary cough headache: Secondary | ICD-10-CM | POA: Diagnosis not present

## 2014-09-26 DIAGNOSIS — J122 Parainfluenza virus pneumonia: Secondary | ICD-10-CM | POA: Diagnosis not present

## 2014-09-26 DIAGNOSIS — R531 Weakness: Secondary | ICD-10-CM | POA: Diagnosis not present

## 2014-09-26 DIAGNOSIS — R0789 Other chest pain: Secondary | ICD-10-CM | POA: Diagnosis not present

## 2014-09-28 ENCOUNTER — Ambulatory Visit: Payer: Medicare Other | Admitting: Nurse Practitioner

## 2014-10-12 ENCOUNTER — Encounter: Payer: Self-pay | Admitting: Nurse Practitioner

## 2014-10-12 ENCOUNTER — Ambulatory Visit (INDEPENDENT_AMBULATORY_CARE_PROVIDER_SITE_OTHER): Payer: Medicare Other | Admitting: Nurse Practitioner

## 2014-10-12 VITALS — BP 150/88 | HR 57 | Temp 97.8°F | Resp 20 | Ht 60.0 in | Wt 252.6 lb

## 2014-10-12 DIAGNOSIS — I10 Essential (primary) hypertension: Secondary | ICD-10-CM | POA: Diagnosis not present

## 2014-10-12 DIAGNOSIS — F411 Generalized anxiety disorder: Secondary | ICD-10-CM

## 2014-10-12 DIAGNOSIS — G8929 Other chronic pain: Secondary | ICD-10-CM

## 2014-10-12 DIAGNOSIS — K219 Gastro-esophageal reflux disease without esophagitis: Secondary | ICD-10-CM

## 2014-10-12 MED ORDER — LISINOPRIL 10 MG PO TABS: 10.0000 mg | ORAL_TABLET | Freq: Every day | ORAL | Status: DC

## 2014-10-12 MED ORDER — ALPRAZOLAM 0.5 MG PO TABS
ORAL_TABLET | ORAL | Status: DC
Start: 2014-10-12 — End: 2014-11-16

## 2014-10-12 MED ORDER — HYDROCODONE-ACETAMINOPHEN 10-325 MG PO TABS: 1.0000 | ORAL_TABLET | Freq: Four times a day (QID) | ORAL | Status: DC | PRN

## 2014-10-12 MED ORDER — SERTRALINE HCL 100 MG PO TABS: 100.0000 mg | ORAL_TABLET | Freq: Every day | ORAL | Status: DC

## 2014-10-12 NOTE — Patient Instructions (Signed)
NOTE CHANGES FOR ALPRAZOLAM, ZOLOFT, AND LISINOPRIL  Take blood pressure twice weekly and record and bring to next visit  Work on exercises- 30 mins every day   Heart healthy diet   Cont to cut back on smoking!!  Follow up in 1 month  Cardiac Diet This diet can help prevent heart disease and stroke. Many factors influence your heart health, including eating and exercise habits. Coronary risk rises a lot with abnormal blood fat (lipid) levels. Cardiac meal planning includes limiting unhealthy fats, increasing healthy fats, and making other small dietary changes. General guidelines are as follows:  Adjust calorie intake to reach and maintain desirable body weight.  Limit total fat intake to less than 30% of total calories. Saturated fat should be less than 7% of calories.  Saturated fats are found in animal products and in some vegetable products. Saturated vegetable fats are found in coconut oil, cocoa butter, palm oil, and palm kernel oil. Read labels carefully to avoid these products as much as possible. Use butter in moderation. Choose tub margarines and oils that have 2 grams of fat or less. Good cooking oils are canola and olive oils.  Practice low-fat cooking techniques. Do not fry food. Instead, broil, bake, boil, steam, grill, roast on a rack, stir-fry, or microwave it. Other fat reducing suggestions include:  Remove the skin from poultry.  Remove all visible fat from meats.  Skim the fat off stews, soups, and gravies before serving them.  Steam vegetables in water or broth instead of sauting them in fat.  Avoid foods with trans fat (or hydrogenated oils), such as commercially fried foods and commercially baked goods. Commercial shortening and deep-frying fats will contain trans fat.  Increase intake of fruits, vegetables, whole grains, and legumes to replace foods high in fat.  Increase consumption of nuts, legumes, and seeds to at least 4 servings weekly. One serving of a  legume equals  cup, and 1 serving of nuts or seeds equals  cup.  Choose whole grains more often. Have 3 servings per day (a serving is 1 ounce [oz]).  Eat 4 to 5 servings of vegetables per day. A serving of vegetables is 1 cup of raw leafy vegetables;  cup of raw or cooked cut-up vegetables;  cup of vegetable juice.  Eat 4 to 5 servings of fruit per day. A serving of fruit is 1 medium whole fruit;  cup of dried fruit;  cup of fresh, frozen, or canned fruit;  cup of 100% fruit juice.  Increase your intake of dietary fiber to 20 to 30 grams per day. Insoluble fiber may help lower your risk of heart disease and may help curb your appetite.  Soluble fiber binds cholesterol to be removed from the blood. Foods high in soluble fiber are dried beans, citrus fruits, oats, apples, bananas, broccoli, Brussels sprouts, and eggplant.  Try to include foods fortified with plant sterols or stanols, such as yogurt, breads, juices, or margarines. Choose several fortified foods to achieve a daily intake of 2 to 3 grams of plant sterols or stanols.  Foods with omega-3 fats can help reduce your risk of heart disease. Aim to have a 3.5 oz portion of fatty fish twice per week, such as salmon, mackerel, albacore tuna, sardines, lake trout, or herring. If you wish to take a fish oil supplement, choose one that contains 1 gram of both DHA and EPA.  Limit processed meats to 2 servings (3 oz portion) weekly.  Limit the sodium in your  diet to 1500 milligrams (mg) per day. If you have high blood pressure, talk to a registered dietitian about a DASH (Dietary Approaches to Stop Hypertension) eating plan.  Limit sweets and beverages with added sugar, such as soda, to no more than 5 servings per week. One serving is:   1 tablespoon sugar.  1 tablespoon jelly or jam.   cup sorbet.  1 cup lemonade.   cup regular soda. CHOOSING FOODS Starches  Allowed: Breads: All kinds (wheat, rye, raisin, white, oatmeal,  [place], [place], and English muffin bread). Low-fat rolls: English muffins, frankfurter and hamburger buns, bagels, pita bread, tortillas (not fried). Pancakes, waffles, biscuits, and muffins made with recommended oil.  Avoid: Products made with saturated or trans fats, oils, or whole milk products. Butter rolls, cheese breads, croissants. Commercial doughnuts, muffins, sweet rolls, biscuits, waffles, pancakes, store-bought mixes. Crackers  Allowed: Low-fat crackers and snacks: Animal, graham, rye, saltine (with recommended oil, no lard), oyster, and matzo crackers. Bread sticks, melba toast, rusks, flatbread, pretzels, and light popcorn.  Avoid: High-fat crackers: cheese crackers, butter crackers, and those made with coconut, palm oil, or trans fat (hydrogenated oils). Buttered popcorn. Cereals  Allowed: Hot or cold whole-grain cereals.  Avoid: Cereals containing coconut, hydrogenated vegetable fat, or animal fat. Potatoes / Pasta / Rice  Allowed: All kinds of potatoes, rice, and pasta (such as macaroni, spaghetti, and noodles).  Avoid: Pasta or rice prepared with cream sauce or high-fat cheese. Chow mein noodles, [place] fries. Vegetables  Allowed: All vegetables and vegetable juices.  Avoid: Fried vegetables. Vegetables in cream, butter, or high-fat cheese sauces. Limit coconut. Fruit in cream or custard. Protein  Allowed: Limit your intake of meat, seafood, and poultry to no more than 6 oz (cooked weight) per day. All lean, well-trimmed beef, veal, pork, and lamb. All chicken and [place] without skin. All fish and shellfish. Wild game: wild duck, rabbit, pheasant, and venison. Egg whites or low-cholesterol egg substitutes may be used as desired. Meatless dishes: recipes with dried beans, peas, lentils, and tofu (soybean curd). Seeds and nuts: all seeds and most nuts.  Avoid: Prime grade and other heavily marbled and fatty meats, such as short ribs, spare ribs, rib eye roast or steak,  frankfurters, sausage, bacon, and high-fat luncheon meats, mutton. Caviar. Commercially fried fish. Domestic duck, goose, venison sausage. Organ meats: liver, gizzard, heart, chitterlings, brains, kidney, sweetbreads. Dairy  Allowed: Low-fat cheeses: nonfat or low-fat cottage cheese (1% or 2% fat), cheeses made with part skim milk, such as mozzarella, farmers, string, or ricotta. (Cheeses should be labeled no more than 2 to 6 grams fat per oz.). Skim (or 1%) milk: liquid, powdered, or evaporated. Buttermilk made with low-fat milk. Drinks made with skim or low-fat milk or cocoa. Chocolate milk or cocoa made with skim or low-fat (1%) milk. Nonfat or low-fat yogurt.  Avoid: Whole milk cheeses, including colby, cheddar, [place], Monterey Jack, [place], [place], Picacho, American, Swiss, and blue. Creamed cottage cheese, cream cheese. Whole milk and whole milk products, including buttermilk or yogurt made from whole milk, drinks made from whole milk. Condensed milk, evaporated whole milk, and 2% milk. Soups and Combination Foods  Allowed: Low-fat low-sodium soups: broth, dehydrated soups, homemade broth, soups with the fat removed, homemade cream soups made with skim or low-fat milk. Low-fat spaghetti, lasagna, chili, and Spanish rice if low-fat ingredients and low-fat cooking techniques are used.  Avoid: Cream soups made with whole milk, cream, or high-fat cheese. All other soups. Desserts and Sweets  Allowed:  Sherbet, fruit ices, gelatins, meringues, and angel food cake. Homemade desserts with recommended fats, oils, and milk products. Jam, jelly, honey, marmalade, sugars, and syrups. Pure sugar candy, such as gum drops, hard candy, jelly beans, marshmallows, mints, and small amounts of dark chocolate.  Avoid: Commercially prepared cakes, pies, cookies, frosting, pudding, or mixes for these products. Desserts containing whole milk products, chocolate, coconut, lard, palm oil, or palm kernel oil. Ice  cream or ice cream drinks. Candy that contains chocolate, coconut, butter, hydrogenated fat, or unknown ingredients. Buttered syrups. Fats and Oils  Allowed: Vegetable oils: safflower, sunflower, corn, soybean, cottonseed, sesame, canola, olive, or peanut. Non-hydrogenated margarines. Salad dressing or mayonnaise: homemade or commercial, made with a recommended oil. Low or nonfat salad dressing or mayonnaise.  Limit added fats and oils to 6 to 8 tsp per day (includes fats used in cooking, baking, salads, and spreads on bread). Remember to count the "hidden fats" in foods.  Avoid: Solid fats and shortenings: butter, lard, salt pork, bacon drippings. Gravy containing meat fat, shortening, or suet. Cocoa butter, coconut. Coconut oil, palm oil, palm kernel oil, or hydrogenated oils: these ingredients are often used in bakery products, nondairy creamers, whipped toppings, candy, and commercially fried foods. Read labels carefully. Salad dressings made of unknown oils, sour cream, or cheese, such as blue cheese and Roquefort. Cream, all kinds: half-and-half, light, heavy, or whipping. Sour cream or cream cheese (even if "light" or low-fat). Nondairy cream substitutes: coffee creamers and sour cream substitutes made with palm, palm kernel, hydrogenated oils, or coconut oil. Beverages  Allowed: Coffee (regular or decaffeinated), tea. Diet carbonated beverages, mineral water. Alcohol: Check with your caregiver. Moderation is recommended.  Avoid: Whole milk, regular sodas, and juice drinks with added sugar. Condiments  Allowed: All seasonings and condiments. Cocoa powder. "Cream" sauces made with recommended ingredients.  Avoid: Carob powder made with hydrogenated fats. SAMPLE MENU Breakfast   cup orange juice   cup oatmeal  1 slice toast  1 tsp margarine  1 cup skim milk Lunch  [place] sandwich with 2 oz [place], 2 slices bread  Lettuce and tomato slices  Fresh fruit  Carrot  sticks  Coffee or tea Snack  Fresh fruit or low-fat crackers Dinner  3 oz lean ground beef  1 baked potato  1 tsp margarine   cup asparagus  Lettuce salad  1 tbs non-creamy dressing   cup peach slices  1 cup skim milk Document Released: 05/20/2008 Document Revised: 02/10/2012 Document Reviewed: 10/11/2013 [employer] Patient Information 2015 [place], [place]. This information is not intended to replace advice given to you by your health care provider. Make sure you discuss any questions you have with your health care provider.

## 2014-10-12 NOTE — Progress Notes (Addendum)
Patient ID: Tiffany [name], female   DOB: [birth year], 64 y.o.   MRN: [id number]    PCP: Lauree Chandler, NP  Allergies  Allergen Reactions  . Cymbalta [Duloxetine Hcl] Other (See Comments)    Sweating, bad dreams,    Chief Complaint  Patient presents with  . Medical Management of Chronic Issues     HPI: Patient is a 64 y.o. female seen in the office today to follow up on chronic conditions. Pt was last seen by Dr Eulas Post in December. Did not tolerate oxycodone and was changed to norco. Has not seen ortho yet.  Pt conts to take zoloft Not taking valium at this time. Feels like anxiety is not well controlled with xanax only  Has cut down to 1.5 cigarettes daily.  Not taking ibuprofen at this time Blood pressure reading at home is 145/80 No acid reflux  Review of Systems:  Review of Systems  Constitutional: Negative for activity change, appetite change, fatigue and unexpected weight change.  HENT: Negative for congestion and hearing loss.   Eyes: Negative.   Respiratory: Negative for cough and shortness of breath.   Cardiovascular: Negative for chest pain, palpitations and leg swelling.  Gastrointestinal: Negative for abdominal pain, diarrhea and constipation.  Genitourinary: Negative for dysuria and difficulty urinating.  Musculoskeletal: Positive for myalgias and arthralgias (see HPI).  Skin: Negative for color change and wound.  Neurological: Negative for dizziness and weakness.  Psychiatric/Behavioral: Negative for behavioral problems, confusion and agitation. The patient is nervous/anxious.        Having anxiety and depression    Past Medical History  Diagnosis Date  . Arthritis     osteoarthritis  . Hx of bronchitis   . Hypertension   . GERD (gastroesophageal reflux disease)    Past Surgical History  Procedure Laterality Date  . Knee surgery      arthroscopic right  . Toe nail removal    . 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  She has a 5 pack-year smoking history. She has never used smokeless tobacco. She reports that she does not drink alcohol or use illicit drugs.  Family History  Problem Relation Age of Onset  . Heart disease Father     Died of CHF age 79  . Heart disease Mother     Died of CHF age 45  . Cancer Brother     Cancer  . Heart failure Brother     Died of CHF   . High blood pressure Son   . Colon cancer Neg Hx     Medications: Patient's Medications  New Prescriptions   No medications on file  Previous Medications   ACETAMINOPHEN (TYLENOL) 500 MG TABLET    Take 1,000 mg by mouth every 6 (six) hours as needed for moderate pain.   ALPRAZOLAM (XANAX) 0.25 MG TABLET    Take 1 tablet (0.25 mg total) by mouth 3 (three) times daily as needed for anxiety.   ASPIRIN 325 MG EC TABLET    Take 325 mg by mouth daily.   DIAZEPAM (VALIUM) 5 MG TABLET    Take 1 tablet (5 mg total) by mouth 2 (two) times daily.   DICLOFENAC SODIUM (VOLTAREN) 1 % GEL    Apply 2 g topically 4 (four) times daily.  HYDROCODONE-ACETAMINOPHEN (NORCO) 10-325 MG PER TABLET    Take 1-2 tablets by mouth every 6 (six) hours as needed for moderate pain.   IBUPROFEN (ADVIL,MOTRIN) 200 MG TABLET    Take 600 mg by mouth every 6 (six) hours as needed for moderate pain.   LISINOPRIL (PRINIVIL,ZESTRIL) 5 MG TABLET    Take 1 tablet (5 mg total) by mouth daily.   METHOCARBAMOL (ROBAXIN) 500 MG TABLET    Take 1 tablet (500 mg total) by mouth every 6 (six) hours as needed for muscle spasms (For Knee Pain/Spasms).   PANTOPRAZOLE (PROTONIX) 40 MG TABLET    Take 1 tablet (40 mg total) by mouth daily.   SERTRALINE (ZOLOFT) 50 MG TABLET    Take 1 tablet (50 mg total) by mouth daily.  Modified Medications   No medications on file  Discontinued Medications   No medications  on file     Physical Exam:  Filed Vitals:   10/12/14 0846  BP: 150/88  Pulse: 57  Temp: 97.8 F (36.6 C)  TempSrc: Oral  Resp: 20  Height: 5' (1.524 m)  Weight: 252 lb 9.6 oz (114.579 kg)  SpO2: 98%    Physical Exam  Constitutional: She is oriented to person, place, and time. She appears well-developed and well-nourished. No distress.  HENT:  Head: Normocephalic and atraumatic.  Neck: Normal range of motion. Neck supple.  Cardiovascular: Normal rate, regular rhythm and normal heart sounds.   Pulmonary/Chest: Effort normal and breath sounds normal.  Abdominal: Soft. Bowel sounds are normal.  Musculoskeletal: She exhibits no edema. Tenderness: with movement to right shoulder and right knee.  Neurological: She is alert and oriented to person, place, and time.  Skin: Skin is warm and dry. She is not diaphoretic.  Psychiatric: Her mood appears anxious.    Labs reviewed: Basic Metabolic Panel:  Recent Labs  03/07/14 1240 05/13/14 0911 05/29/14 1611 08/23/14 0910  NA 142 137 143 141  K 4.2 4.2 3.4* 4.2  CL 104 101 106 102  CO2 24 23  --  23  GLUCOSE 104* 127* 129* 144*  BUN 15 17 10 8   CREATININE 0.79 0.82 0.80 0.84  CALCIUM 9.1 9.0  --  9.1   Liver Function Tests:  Recent Labs  12/28/13 1209 08/23/14 0910  AST 15 12  ALT 12 17  ALKPHOS 134* 126*  BILITOT 0.3 0.4  PROT 7.3 7.3   No results for input(s): LIPASE, AMYLASE in the last 8760 hours. No results for input(s): AMMONIA in the last 8760 hours. CBC:  Recent Labs  12/28/13 1209 03/07/14 1240 05/13/14 0911 05/29/14 1611  WBC 5.9 5.2 7.7  --   NEUTROABS 3.8 3.4 5.7  --   HGB 12.5 12.2 12.4 15.0  HCT 39.3 37.8 39.6 44.0  MCV 83 80.8 84.4  --   PLT 279 259 263  --    Lipid Panel:  Recent Labs  12/28/13 1209  HDL 40  LDLCALC 55  TRIG 96  CHOLHDL 2.9   TSH: No results for input(s): TSH in the last 8760 hours. A1C: Lab Results  Component Value Date   HGBA1C 6.9* 08/29/2013      Assessment/Plan  1. Generalized anxiety disorder -feels like anxiety is not under control.  -will dc valium, xanax changed to 0.5, may take 1/2-1 tablet every 8 hours as needed -also to increase zoloft - ALPRAZolam (XANAX) 0.5 MG tablet; 1/2 tablet- 1 tablet three times daily as needed for anxiety attacks  Dispense: 60 tablet;  Refill: 0 - sertraline (ZOLOFT) 100 MG tablet; Take 1 tablet (100 mg total) by mouth daily.  Dispense: 30 tablet; Refill: 3  2. Chronic pain -needs to follow up with ortho regarding knee pain. Plans to do this.  - HYDROcodone-acetaminophen (NORCO) 10-325 MG per tablet; Take 1-2 tablets by mouth every 6 (six) hours as needed for moderate pain.  Dispense: 120 tablet; Refill: 0  3. Gastroesophageal reflux disease without esophagitis -conts on protonix   4. Essential hypertension -not under adequate control. Will increase lisinopril to 10 mg daily - lisinopril (PRINIVIL,ZESTRIL) 10 MG tablet; Take 1 tablet (10 mg total) by mouth daily.  Dispense: 30 tablet; Refill: 3  Follow up in 1 month on blood pressure and anxiety.

## 2014-10-16 ENCOUNTER — Other Ambulatory Visit: Payer: Medicare Other

## 2014-11-07 ENCOUNTER — Emergency Department (HOSPITAL_COMMUNITY)
Admission: EM | Admit: 2014-11-07 | Discharge: 2014-11-07 | Disposition: A | Discharge status: Home / Self Care | Payer: Medicare Other | Attending: Emergency Medicine | Admitting: Emergency Medicine

## 2014-11-07 ENCOUNTER — Encounter (HOSPITAL_COMMUNITY): Payer: Self-pay

## 2014-11-07 DIAGNOSIS — Z9889 Other specified postprocedural states: Secondary | ICD-10-CM | POA: Insufficient documentation

## 2014-11-07 DIAGNOSIS — I10 Essential (primary) hypertension: Secondary | ICD-10-CM | POA: Diagnosis not present

## 2014-11-07 DIAGNOSIS — M13861 Other specified arthritis, right knee: Secondary | ICD-10-CM | POA: Diagnosis not present

## 2014-11-07 DIAGNOSIS — Z79899 Other long term (current) drug therapy: Secondary | ICD-10-CM | POA: Insufficient documentation

## 2014-11-07 DIAGNOSIS — M1990 Unspecified osteoarthritis, unspecified site: Secondary | ICD-10-CM | POA: Diagnosis not present

## 2014-11-07 DIAGNOSIS — Z7982 Long term (current) use of aspirin: Secondary | ICD-10-CM | POA: Diagnosis not present

## 2014-11-07 DIAGNOSIS — Z8709 Personal history of other diseases of the respiratory system: Secondary | ICD-10-CM | POA: Insufficient documentation

## 2014-11-07 DIAGNOSIS — K219 Gastro-esophageal reflux disease without esophagitis: Secondary | ICD-10-CM | POA: Insufficient documentation

## 2014-11-07 DIAGNOSIS — Z720 Tobacco use: Secondary | ICD-10-CM | POA: Diagnosis not present

## 2014-11-07 DIAGNOSIS — G8929 Other chronic pain: Secondary | ICD-10-CM | POA: Diagnosis not present

## 2014-11-07 DIAGNOSIS — R2241 Localized swelling, mass and lump, right lower limb: Secondary | ICD-10-CM | POA: Diagnosis present

## 2014-11-07 MED ORDER — HYDROCODONE-ACETAMINOPHEN 5-325 MG PO TABS
2.0000 | ORAL_TABLET | Freq: Once | ORAL | Status: AC
  Administered 2014-11-07: 2 via ORAL
  Filled 2014-11-07: qty 2

## 2014-11-07 MED ORDER — HYDROCODONE-ACETAMINOPHEN 10-325 MG PO TABS: 1.0000 | ORAL_TABLET | Freq: Four times a day (QID) | ORAL | Status: DC | PRN

## 2014-11-07 NOTE — Discharge Instructions (Signed)
Arthritis, Nonspecific °Arthritis is pain, redness, warmth, or puffiness (inflammation) of a joint. The joint may be stiff or hurt when you move it. One or more joints may be affected. There are many types of arthritis. Your doctor may not know what type you have right away. The most common cause of arthritis is wear and tear on the joint (osteoarthritis). °HOME CARE  °· Only take medicine as told by your doctor. °· Rest the joint as much as possible. °· Raise (elevate) your joint if it is puffy. °· Use crutches if the painful joint is in your leg. °· Drink enough fluids to keep your pee (urine) clear or pale yellow. °· Follow your doctor's diet instructions. °· Use cold packs for very bad joint pain for 10 to 15 minutes every hour. Ask your doctor if it is okay for you to use hot packs. °· Exercise as told by your doctor. °· Take a warm shower if you have stiffness in the morning. °· Move your sore joints throughout the day. °GET HELP RIGHT AWAY IF:  °· You have a fever. °· You have very bad joint pain, puffiness, or redness. °· You have many joints that are painful and puffy. °· You are not getting better with treatment. °· You have very bad back pain or leg weakness. °· You cannot control when you poop (bowel movement) or pee (urinate). °· You do not feel better in 24 hours or are getting worse. °· You are having side effects from your medicine. °MAKE SURE YOU:  °· Understand these instructions. °· Will watch your condition. °· Will get help right away if you are not doing well or get worse. °Document Released: 11/05/2009 Document Revised: 02/10/2012 Document Reviewed: 11/05/2009 °[employer]® Patient Information ©2015 [employer]. This information is not intended to replace advice given to you by your health care provider. Make sure you discuss any questions you have with your health care provider. ° °

## 2014-11-07 NOTE — ED Notes (Signed)
Pt c/o R knee pain and swelling starting last night.  Pain score 10/10.  Denies injury.  Pt reports taking ibuprofen w/o relief.  Pt sts "I've had the other knee replaced and I need this one replaced."

## 2014-11-07 NOTE — ED Provider Notes (Signed)
CSN: [id number]     Arrival date & time 11/07/14  1835 History  This chart was scribed for Charlann Lange, PA-C, working with Ezequiel Essex, MD by Starleen Arms, ED Scribe. This patient was seen in room WTR7/WTR7 and the patient's care was started at 8:18 PM.   Chief Complaint  Patient presents with  . Leg Swelling   HPI HPI Comments: Tiffany [name] is a 64 y.o. female with a history of chronic right knee pain who presents to the Emergency Department complaining of right knee pain and swelling without injury.  Patient reports her primary care PA, Eubanks, prescribes hydrocodone for pain without a pain management contract.  The patient reports she has run out of her pain medication and has taken ibuprofen without relief.    Patient denies injury.  Past Medical History  Diagnosis Date  . Arthritis     osteoarthritis  . Hx of bronchitis   . Hypertension   . GERD (gastroesophageal reflux disease)    Past Surgical History  Procedure Laterality Date  . Knee surgery      arthroscopic right  . Toe nail removal    . 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 Heart disease Father     Died of CHF age 64  . Heart disease Mother     Died of CHF age 20  . Cancer Brother     Cancer  . Heart failure Brother     Died of CHF   . High blood pressure Son   . Colon cancer Neg Hx    History  Substance Use Topics  . Smoking status: Current Every Day Smoker -- 0.25 packs/day for 20 years    Types: Cigarettes  . Smokeless tobacco: Never Used     Comment: 2-3 cig daily   . Alcohol Use: No   OB History    No data available     Review of Systems  Musculoskeletal: Positive for joint swelling and arthralgias.      Allergies  Cymbalta  Home Medications   Prior to Admission medications   Medication Sig  Start Date End Date Taking? Authorizing Provider  acetaminophen (TYLENOL) 500 MG tablet Take 1,000 mg by mouth every 6 (six) hours as needed for moderate pain.    Historical Provider, MD  ALPRAZolam Duanne Moron) 0.5 MG tablet 1/2 tablet- 1 tablet three times daily as needed for anxiety attacks 10/12/14   Lauree Chandler, NP  aspirin 325 MG EC tablet Take 325 mg by mouth daily.    Historical Provider, MD  cloNIDine (CATAPRES) 0.1 MG tablet Take 1 tablet by mouth daily. 09/26/14   Historical Provider, MD  diclofenac sodium (VOLTAREN) 1 % GEL Apply 2 g topically 4 (four) times daily. 08/06/14   Antonietta Breach, PA-C  HYDROcodone-acetaminophen (NORCO) 10-325 MG per tablet Take 1-2 tablets by mouth every 6 (six) hours as needed for moderate pain. 10/12/14   Lauree Chandler, NP  lisinopril (PRINIVIL,ZESTRIL) 10 MG tablet Take 1 tablet (10 mg total) by mouth daily. 10/12/14   Lauree Chandler, NP  methocarbamol (ROBAXIN) 500 MG tablet Take 1 tablet (500 mg total) by mouth every 6 (six) hours as needed for muscle spasms (For Knee Pain/Spasms). 11/03/13   Lauree Chandler, NP  pantoprazole (PROTONIX) 40 MG tablet Take  1 tablet (40 mg total) by mouth daily. 12/28/13   Lauree Chandler, NP  sertraline (ZOLOFT) 100 MG tablet Take 1 tablet (100 mg total) by mouth daily. 10/12/14   Lauree Chandler, NP   BP 139/80 mmHg  Pulse 77  Temp(Src) 98.1 F (36.7 C) (Oral)  Resp 20  SpO2 97% Physical Exam  Constitutional: She is oriented to person, place, and time. She appears well-developed and well-nourished. No distress.  HENT:  Head: Normocephalic and atraumatic.  Eyes: Conjunctivae and EOM are normal.  Neck: Neck supple. No tracheal deviation present.  Cardiovascular: Normal rate.   Pulmonary/Chest: Effort normal. No respiratory distress.  Musculoskeletal: Normal range of motion.  No redness or warmth to right knee.  Joint is stable with FROM.  Full strength of motion distally.  Distal pulses intact.  Neurological:  She is alert and oriented to person, place, and time.  Skin: Skin is warm and dry.  Psychiatric: She has a normal mood and affect. Her behavior is normal.  Nursing note and vitals reviewed.   ED Course  Procedures (including critical care time)  DIAGNOSTIC STUDIES: Oxygen Saturation is 97% on RA, normal by my interpretation.    COORDINATION OF CARE:  8:23 PM Discussed treatment plan with patient at bedside.  Patient acknowledges and agrees with plan.    Labs Review Labs Reviewed - No data to display  Imaging Review No results found.   EKG Interpretation None      MDM   Final diagnoses:  None    1. Arthritis  Chronic/recurrent knee pain without evidence septic joint. Stable for discharge.    I personally performed the services described in this documentation, which was scribed in my presence. The recorded information has been reviewed and is accurate    Charlann Lange, PA-C 11/07/14 2158  Ezequiel Essex, MD 11/07/14 [phone]

## 2014-11-16 ENCOUNTER — Encounter: Payer: Self-pay | Admitting: Nurse Practitioner

## 2014-11-16 ENCOUNTER — Ambulatory Visit (INDEPENDENT_AMBULATORY_CARE_PROVIDER_SITE_OTHER): Payer: Medicare Other | Admitting: Nurse Practitioner

## 2014-11-16 VITALS — BP 130/88 | HR 78 | Temp 98.0°F | Resp 20 | Ht 60.0 in | Wt 244.6 lb

## 2014-11-16 DIAGNOSIS — M25519 Pain in unspecified shoulder: Secondary | ICD-10-CM | POA: Diagnosis not present

## 2014-11-16 DIAGNOSIS — M170 Bilateral primary osteoarthritis of knee: Secondary | ICD-10-CM

## 2014-11-16 DIAGNOSIS — F418 Other specified anxiety disorders: Secondary | ICD-10-CM | POA: Diagnosis not present

## 2014-11-16 DIAGNOSIS — G8929 Other chronic pain: Secondary | ICD-10-CM

## 2014-11-16 DIAGNOSIS — F32A Depression, unspecified: Secondary | ICD-10-CM

## 2014-11-16 DIAGNOSIS — I10 Essential (primary) hypertension: Secondary | ICD-10-CM | POA: Diagnosis not present

## 2014-11-16 DIAGNOSIS — F419 Anxiety disorder, unspecified: Secondary | ICD-10-CM

## 2014-11-16 DIAGNOSIS — F329 Major depressive disorder, single episode, unspecified: Secondary | ICD-10-CM

## 2014-11-16 MED ORDER — VILAZODONE HCL 10 & 20 & 40 MG PO KIT: PACK | ORAL | Status: DC

## 2014-11-16 MED ORDER — HYDROCODONE-ACETAMINOPHEN 10-325 MG PO TABS: 1.0000 | ORAL_TABLET | Freq: Four times a day (QID) | ORAL | Status: DC | PRN

## 2014-11-16 MED ORDER — ALPRAZOLAM 0.5 MG PO TABS
ORAL_TABLET | ORAL | Status: DC
Start: 2014-11-16 — End: 2014-12-18

## 2014-11-16 NOTE — Progress Notes (Signed)
Patient ID: Tiffany [name], female   DOB: [birth year], 64 y.o.   MRN: [id number]    PCP: Lauree Chandler, NP  Allergies  Allergen Reactions  . Cymbalta [Duloxetine Hcl] Other (See Comments)    Sweating, bad dreams,    Chief Complaint  Patient presents with  . Medical Management of Chronic Issues    1 month follow on high blood pressure and anxiety     HPI: Patient is a 64 y.o. female seen in the office today for evaluation of HTN and anxiety Reports worsening pain to left shoulder, would like brace. No injury just over time increase pain with movement and decreased ROM. Has never had PT/OT. Has appt scheduled with orthopedic in June.   Increased lisinopril to 10 mg daily at last visit, tolerating medication, does report side effects. Does not take blood pressures at home.   Increased Zoloft to 100 mg at last visit, still requiring xanax daily. Reports she is depressed due to all the family tragedy  (grandson and daughters boyfriend died). Has feeling of being down, decrease motivation. Denies HI or SI Has not had therapy or counseling, using prayer   Review of Systems:  Review of Systems  Constitutional: Negative for activity change, appetite change, fatigue and unexpected weight change.  HENT: Negative for congestion and hearing loss.   Eyes: Negative.   Respiratory: Negative for cough and shortness of breath.   Cardiovascular: Negative for chest pain, palpitations and leg swelling.  Gastrointestinal: Negative for abdominal pain, diarrhea and constipation.  Genitourinary: Negative for dysuria and difficulty urinating.  Musculoskeletal: Positive for myalgias and arthralgias (see HPI).  Skin: Negative for color change and wound.  Neurological: Negative for dizziness and weakness.  Psychiatric/Behavioral: Negative for behavioral problems, confusion and agitation. The patient is nervous/anxious.        Having anxiety and depression    Past Medical History  Diagnosis Date  .  Arthritis     osteoarthritis  . Hx of bronchitis   . Hypertension   . GERD (gastroesophageal reflux disease)    Past Surgical History  Procedure Laterality Date  . Knee surgery      arthroscopic right  . Toe nail removal    . 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  She has a 5 pack-year smoking history. She has never used smokeless tobacco. She reports that she does not drink alcohol or use illicit drugs.  Family History  Problem Relation Age of Onset  . Heart disease Father     Died of CHF age 39  . Heart disease Mother     Died of CHF age 97  . Cancer Brother     Cancer  . Heart failure Brother     Died of CHF   . High blood pressure Son   . Colon cancer Neg Hx     Medications: Patient's Medications  New Prescriptions   No medications on file  Previous Medications   ACETAMINOPHEN (TYLENOL) 500 MG TABLET    Take 1,000 mg by mouth every 6 (six) hours as needed for moderate pain.   ALPRAZOLAM (XANAX) 0.5 MG TABLET    1/2 tablet- 1 tablet three times daily as needed for anxiety attacks   ASPIRIN 325 MG EC TABLET    Take 325 mg  by mouth daily.   CLONIDINE (CATAPRES) 0.1 MG TABLET    Take 1 tablet by mouth daily.   DICLOFENAC SODIUM (VOLTAREN) 1 % GEL    Apply 2 g topically 4 (four) times daily.   HYDROCODONE-ACETAMINOPHEN (NORCO) 10-325 MG PER TABLET    Take 1 tablet by mouth every 6 (six) hours as needed.   LISINOPRIL (PRINIVIL,ZESTRIL) 10 MG TABLET    Take 1 tablet (10 mg total) by mouth daily.   METHOCARBAMOL (ROBAXIN) 500 MG TABLET    Take 1 tablet (500 mg total) by mouth every 6 (six) hours as needed for muscle spasms (For Knee Pain/Spasms).   PANTOPRAZOLE (PROTONIX) 40 MG TABLET    Take 1 tablet (40 mg total) by mouth daily.   SERTRALINE (ZOLOFT) 100 MG TABLET    Take 1  tablet (100 mg total) by mouth daily.  Modified Medications   No medications on file  Discontinued Medications   No medications on file     Physical Exam:  Filed Vitals:   11/16/14 0911 11/16/14 0925  BP: 140/90 130/88  Pulse: 78   Temp: 98 F (36.7 C)   TempSrc: Oral   Resp: 20   Height: 5' (1.524 m)   Weight: 244 lb 9.6 oz (110.95 kg)   SpO2: 98%     Physical Exam  Constitutional: She is oriented to person, place, and time. She appears well-developed and well-nourished. No distress.  HENT:  Head: Normocephalic and atraumatic.  Neck: Normal range of motion. Neck supple.  Cardiovascular: Normal rate, regular rhythm and normal heart sounds.   Pulmonary/Chest: Effort normal and breath sounds normal.  Abdominal: Soft. Bowel sounds are normal.  Musculoskeletal: She exhibits no edema. Tenderness: with movement to right shoulder and right knee.       Right shoulder: She exhibits decreased range of motion.       Left shoulder: She exhibits decreased range of motion.  Neurological: She is alert and oriented to person, place, and time.  Skin: Skin is warm and dry. She is not diaphoretic.  Psychiatric: Her mood appears anxious.    Labs reviewed: Basic Metabolic Panel:  Recent Labs  03/07/14 1240 05/13/14 0911 05/29/14 1611 08/23/14 0910  NA 142 137 143 141  K 4.2 4.2 3.4* 4.2  CL 104 101 106 102  CO2 24 23  --  23  GLUCOSE 104* 127* 129* 144*  BUN _0 CREATININE 0.79 0.82 0.80 0.84  CALCIUM 9.1 9.0  --  9.1   Liver Function Tests:  Recent Labs  12/28/13 1209 08/23/14 0910  AST 15 12  ALT 12 17  ALKPHOS 134* 126*  BILITOT 0.3 0.4  PROT 7.3 7.3   No results for input(s): LIPASE, AMYLASE in the last 8760 hours. No results for input(s): AMMONIA in the last 8760 hours. CBC:  Recent Labs  12/28/13 1209 03/07/14 1240 05/13/14 0911 05/29/14 1611  WBC 5.9 5.2 7.7  --   NEUTROABS 3.8 3.4 5.7  --   HGB 12.5 12.2 12.4 15.0  HCT 39.3 37.8 39.6 44.0    MCV 83 80.8 84.4  --   PLT 279 259 263  --    Lipid Panel:  Recent Labs  12/28/13 1209  CHOL 114  HDL 40  LDLCALC 55  TRIG 96  CHOLHDL 2.9   TSH: No results for input(s): TSH in the last 8760 hours. A1C: Lab Results  Component Value Date   HGBA1C 6.9* 08/29/2013     Assessment/Plan  1. Anxiety and depression -zoloft not effective at increased dose, still having panic attacks daily, will titrate off and start Viibryd titration pack due to anxiety and depression this medication may work better for her symptoms. Given warning signs and when to call office  - Vilazodone HCl 10 & 20 & 40 MG KIT; 10 mg daily for 7 days, then 20 mg daily for 7 days, then 40 mg daily - ALPRAZolam (XANAX) 0.5 MG tablet; 1/2 tablet- 1 tablet three times daily as needed for anxiety attacks  Dispense: 60 tablet; Refill: 0  2. Essential hypertension -blood pressure with better controlled on current regimen, cont current medications - Basic metabolic panel  3. Chronic pain -due to chronic arthritis, went to the ED due to worsening pain in bilateral knees, pain medication effective, ortho appt scheduled - HYDROcodone-acetaminophen (NORCO) 10-325 MG per tablet; Take 1 tablet by mouth every 6 (six) hours as needed.  Dispense: 120 tablet; Refill: 0  4. Primary osteoarthritis of both knees -has orthopedic appt pending - HYDROcodone-acetaminophen (NORCO) 10-325 MG per tablet; Take 1 tablet by mouth every 6 (six) hours as needed.  Dispense: 120 tablet; Refill: 0  5. Pain in joint, shoulder region, unspecified laterality -had forms for immobilizer, would not recommenced this at this time, will have pt evaluated and treated by OT - HYDROcodone-acetaminophen (NORCO) 10-325 MG per tablet; Take 1 tablet by mouth every 6 (six) hours as needed.  Dispense: 120 tablet; Refill: 0 - Ambulatory referral to Occupational Therapy   To follow up in 1 month on depression and anxiety

## 2014-11-16 NOTE — Patient Instructions (Signed)
Will decrease zoloft to 50 mg daily for 7 days then decrease to 25 mg for 7 days then stop Start Viibryd titration pack when you start the zoloft 25 mg daily   Stop medication  and notify us if you have any thought of hurting yourself or others  Follow up in 4 weeks

## 2014-11-17 LAB — BASIC METABOLIC PANEL
BUN / CREAT RATIO: 15 (ref 11–26)
BUN: 13 mg/dL (ref 8–27)
CALCIUM: 9.1 mg/dL (ref 8.7–10.3)
CO2: 24 mmol/L (ref 18–29)
Chloride: 102 mmol/L (ref 97–108)
Creatinine, Ser: 0.86 mg/dL (ref 0.57–1.00)
GFR calc Af Amer: 83 mL/min/{1.73_m2} (ref >59)
GFR calc non Af Amer: 72 mL/min/{1.73_m2} (ref >59)
GLUCOSE: 116 mg/dL — AB (ref 65–99)
POTASSIUM: 4.2 mmol/L (ref 3.5–5.2)
SODIUM: 142 mmol/L (ref 134–144)

## 2014-11-27 IMAGING — CR DG WRIST COMPLETE 3+V*R*
4 series · 4 of 4 positions shown · non-contrast
Comparison: None.

CLINICAL DATA: Right hand pain that radiates into wrist. No injury.
Initial evaluation.

EXAM:
RIGHT WRIST - COMPLETE 3+ VIEW

[x wrist pa right]
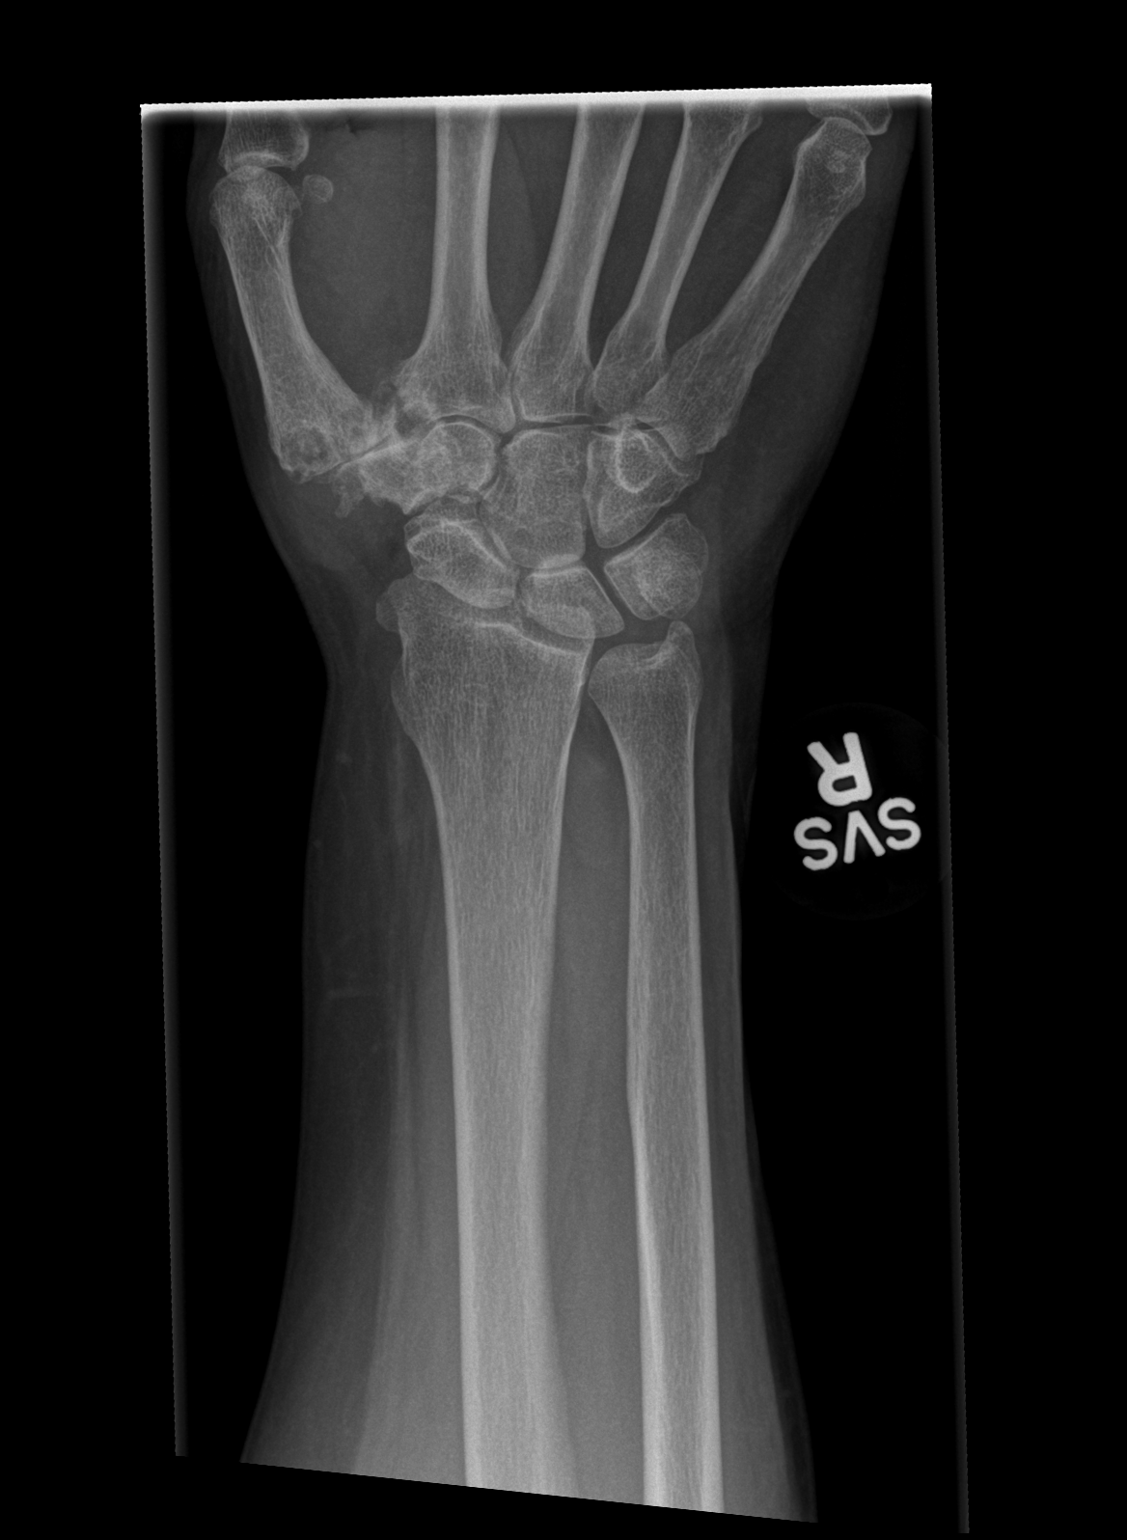

[x wrist obl right]
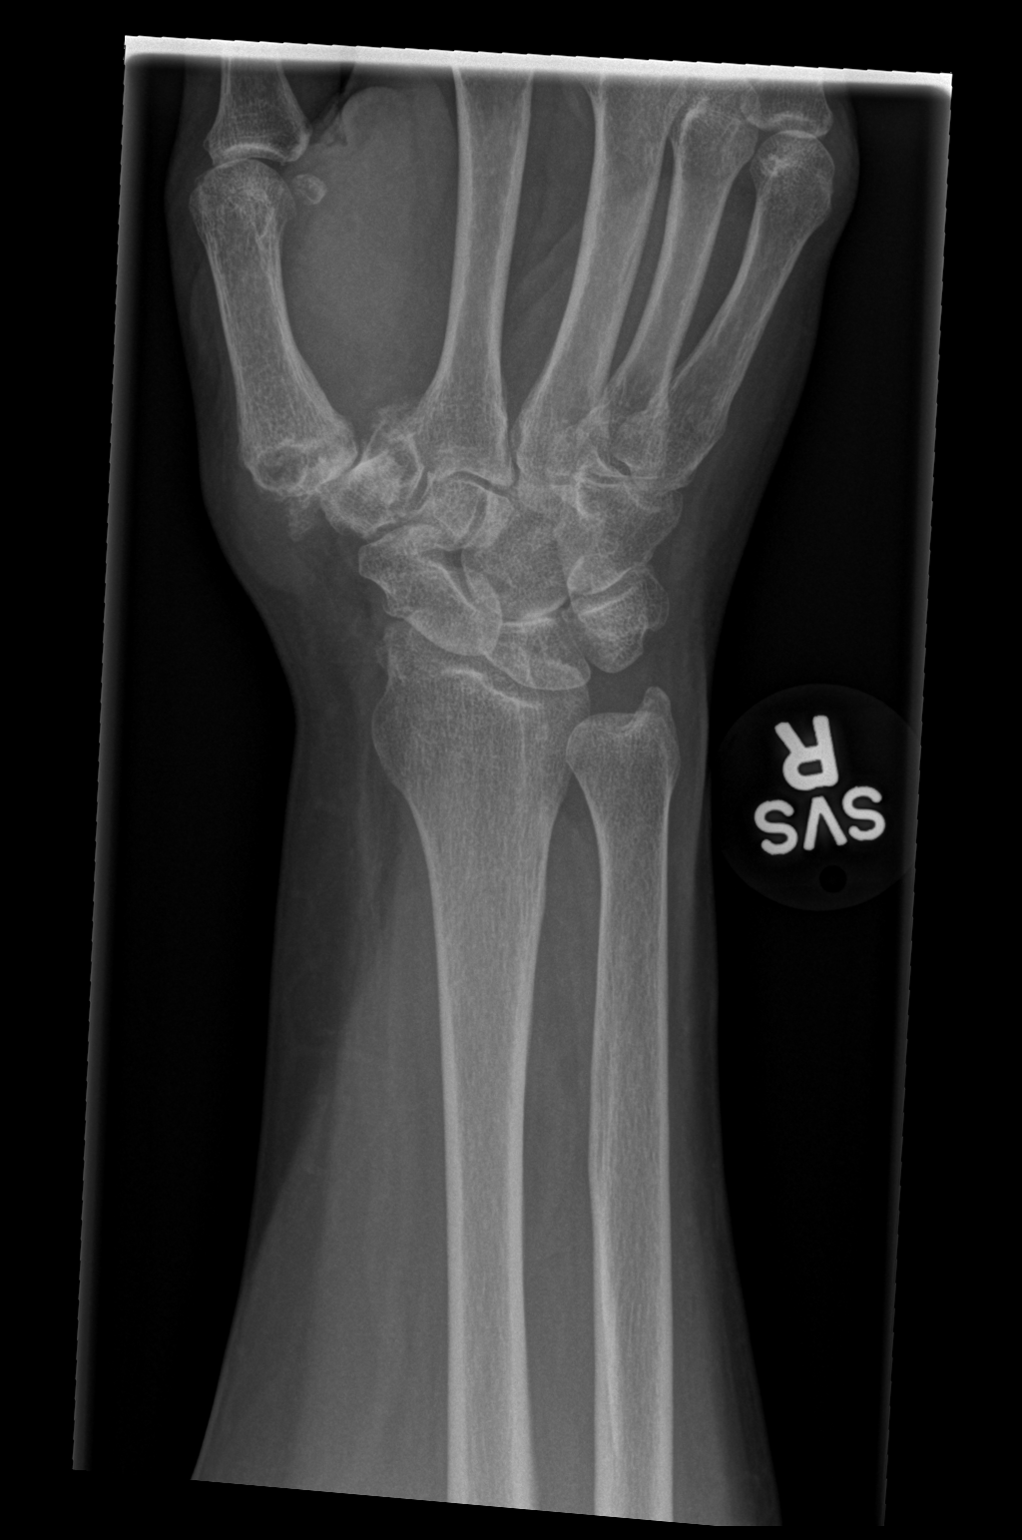

[x wrist lat right]
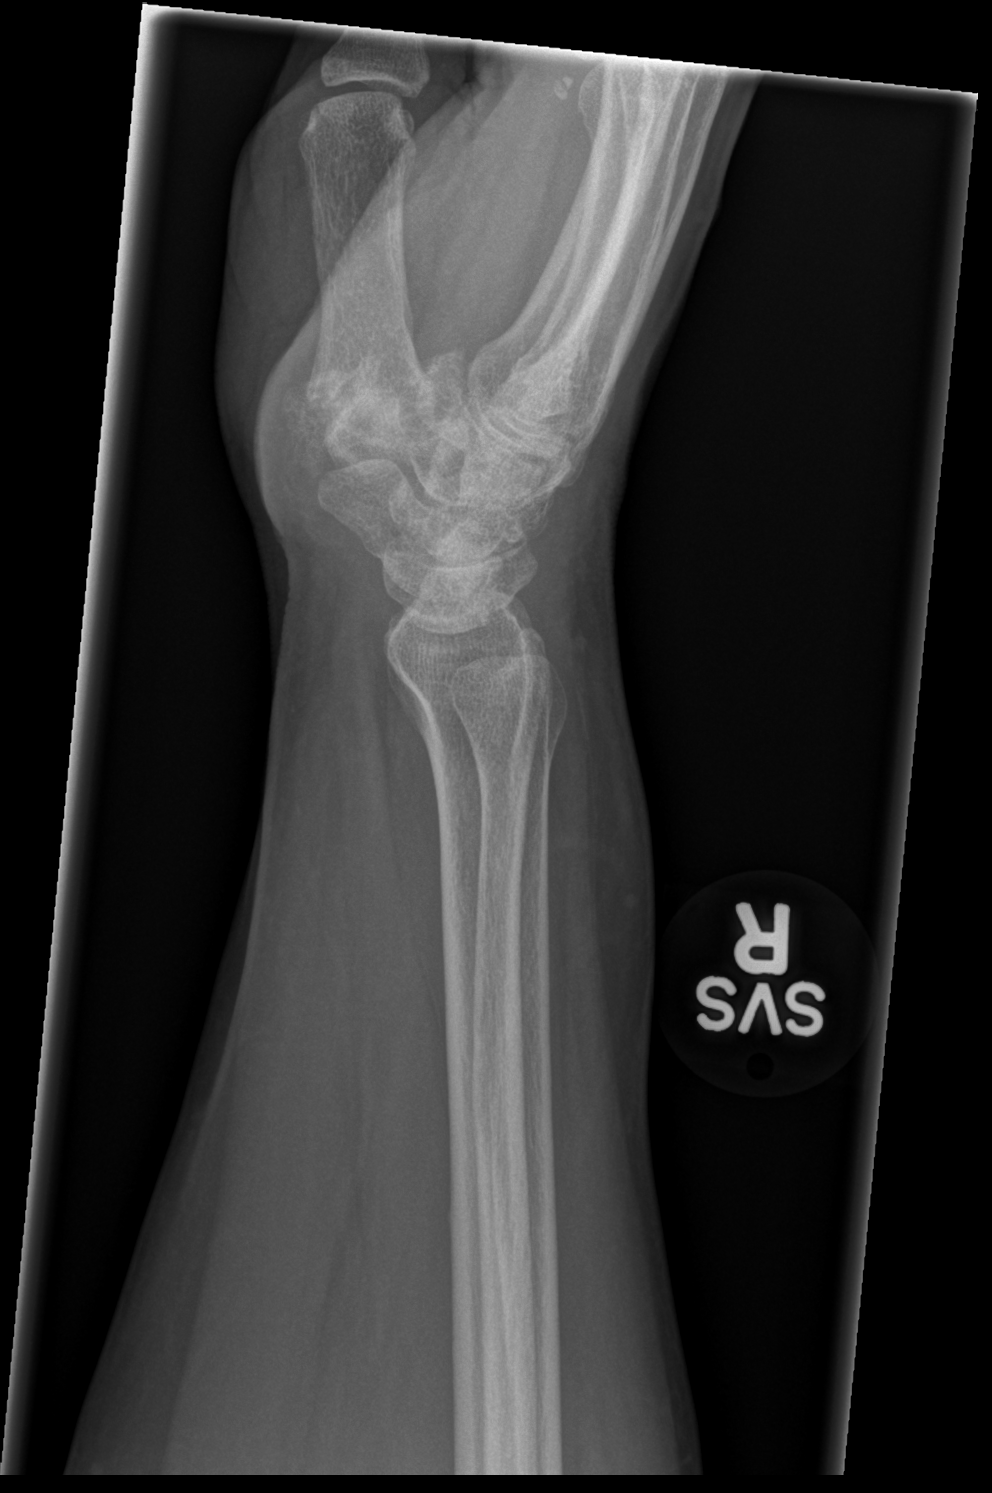

[x wrist navicular view right]
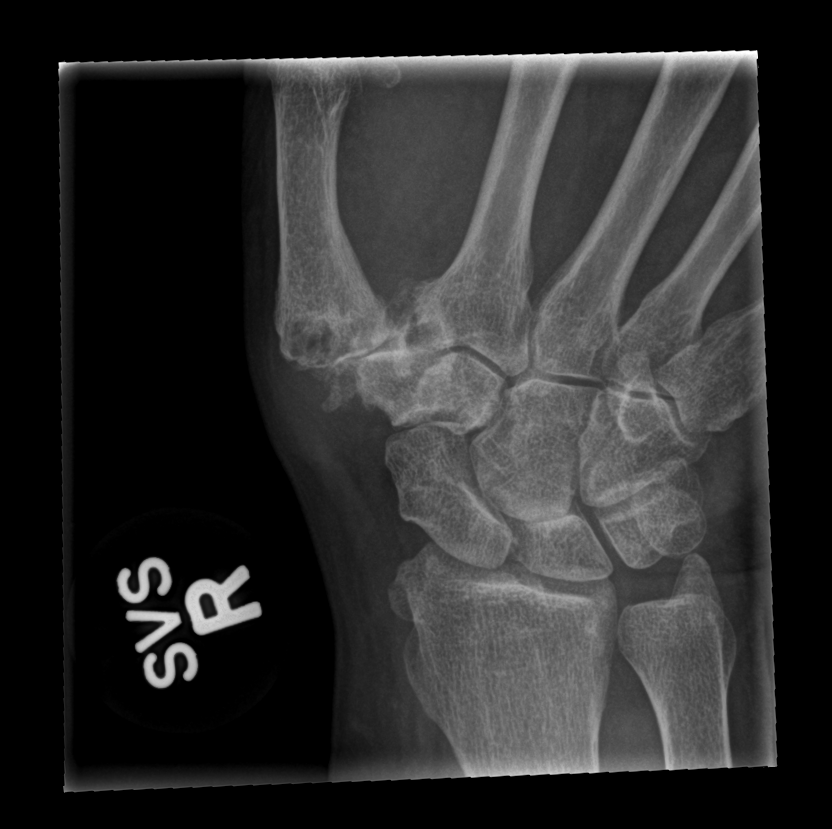

[4 of 4 positions shown; findings below may reference images not displayed]

FINDINGS: Diffuse degenerative changes present. Degenerative changes are most
severe at the first carpometacarpal joint and the radiocarpal joint.
No evidence of fracture or dislocation.
IMPRESSION: Severe degenerative changes right wrist particularly prominent at
the first carpometacarpal and radiocarpal joint spaces. No acute
abnormality.

## 2014-11-27 IMAGING — CR DG CHEST 2V
2 series · 2 of 2 positions shown · non-contrast
Comparison: Radiographs 05/13/2014.  CT 08/29/2013.

CLINICAL DATA: Intermittent anxiety for 2 weeks. Diaphoresis and
shortness of breath. Initial encounter.

EXAM:
CHEST  2 VIEW

[w chest pa]
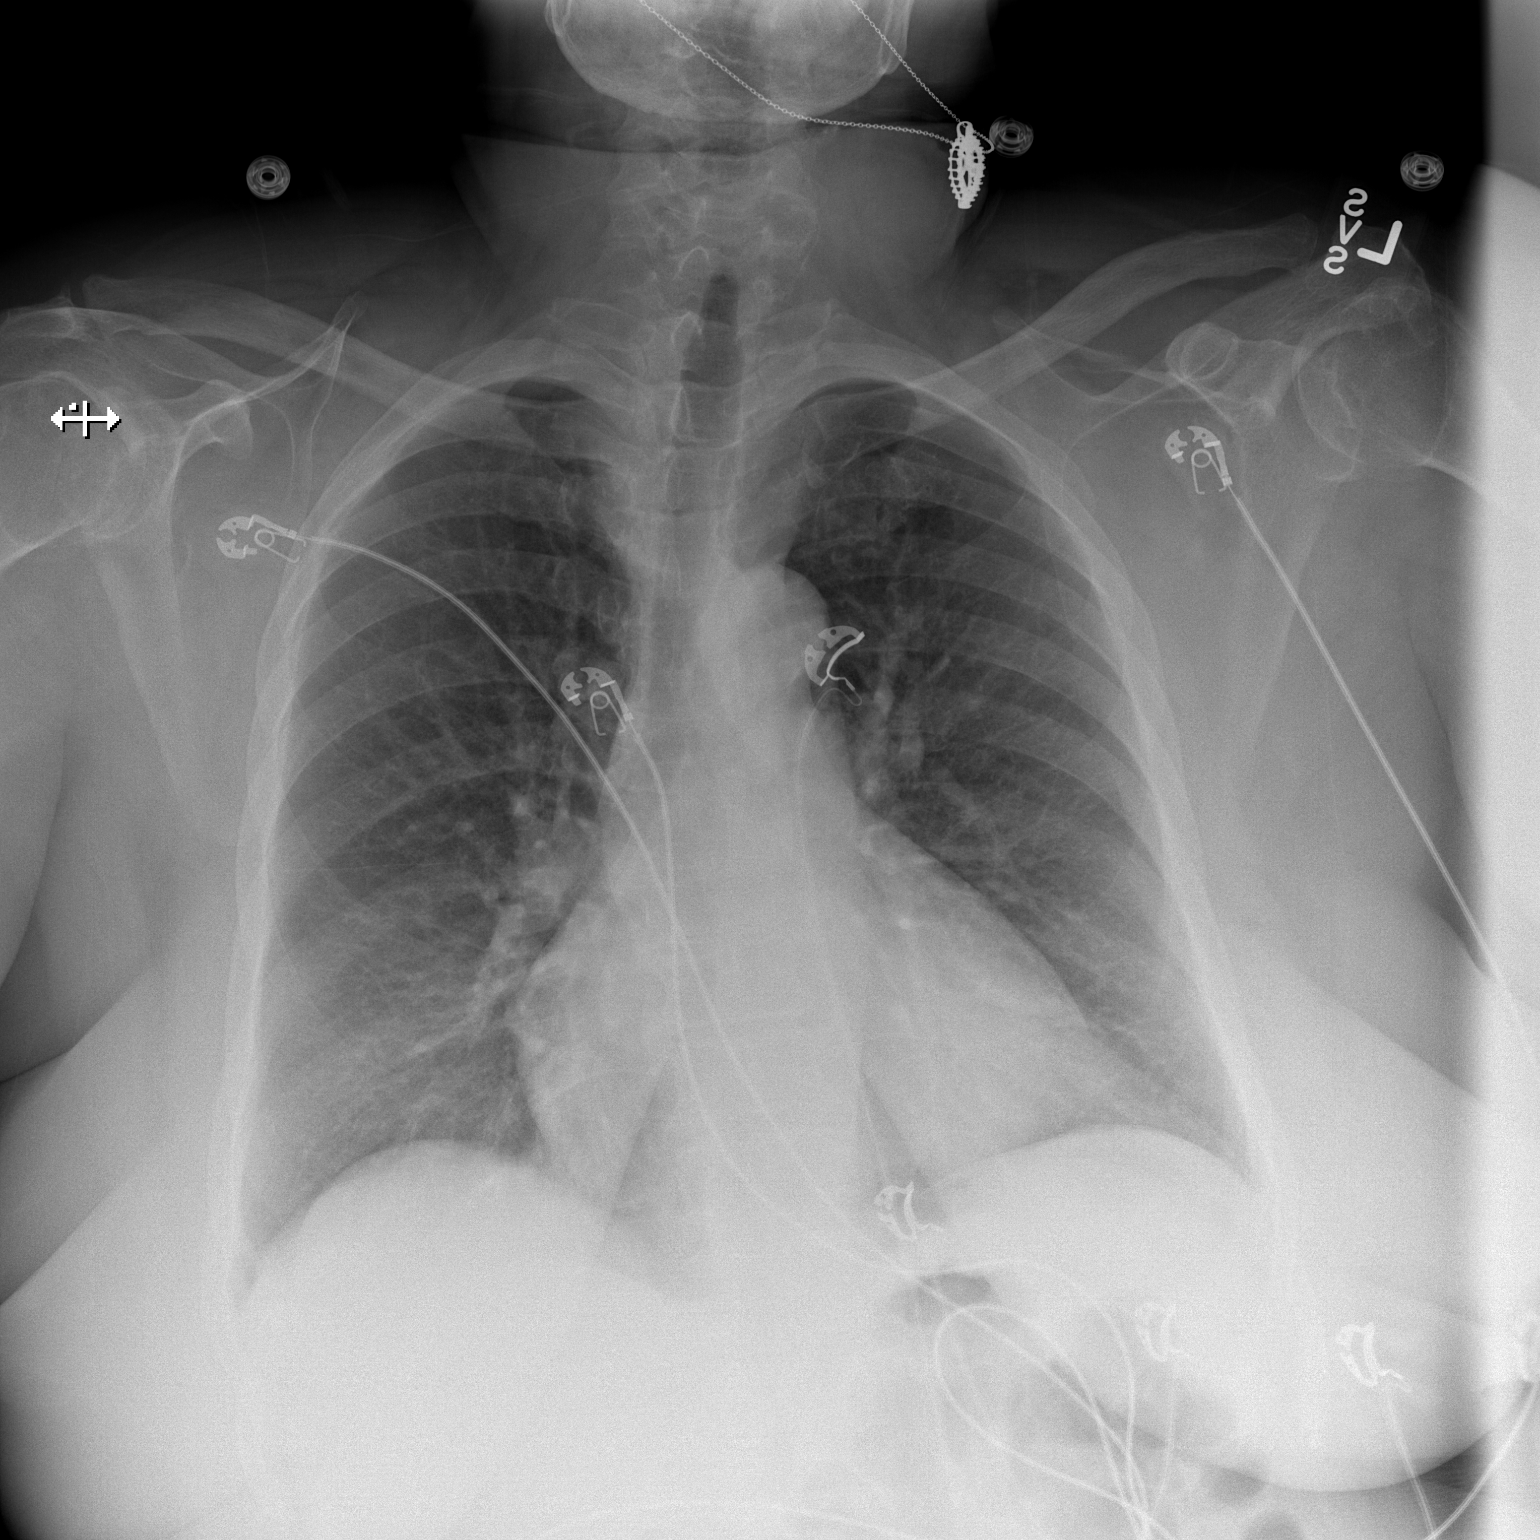

[w chest lat]
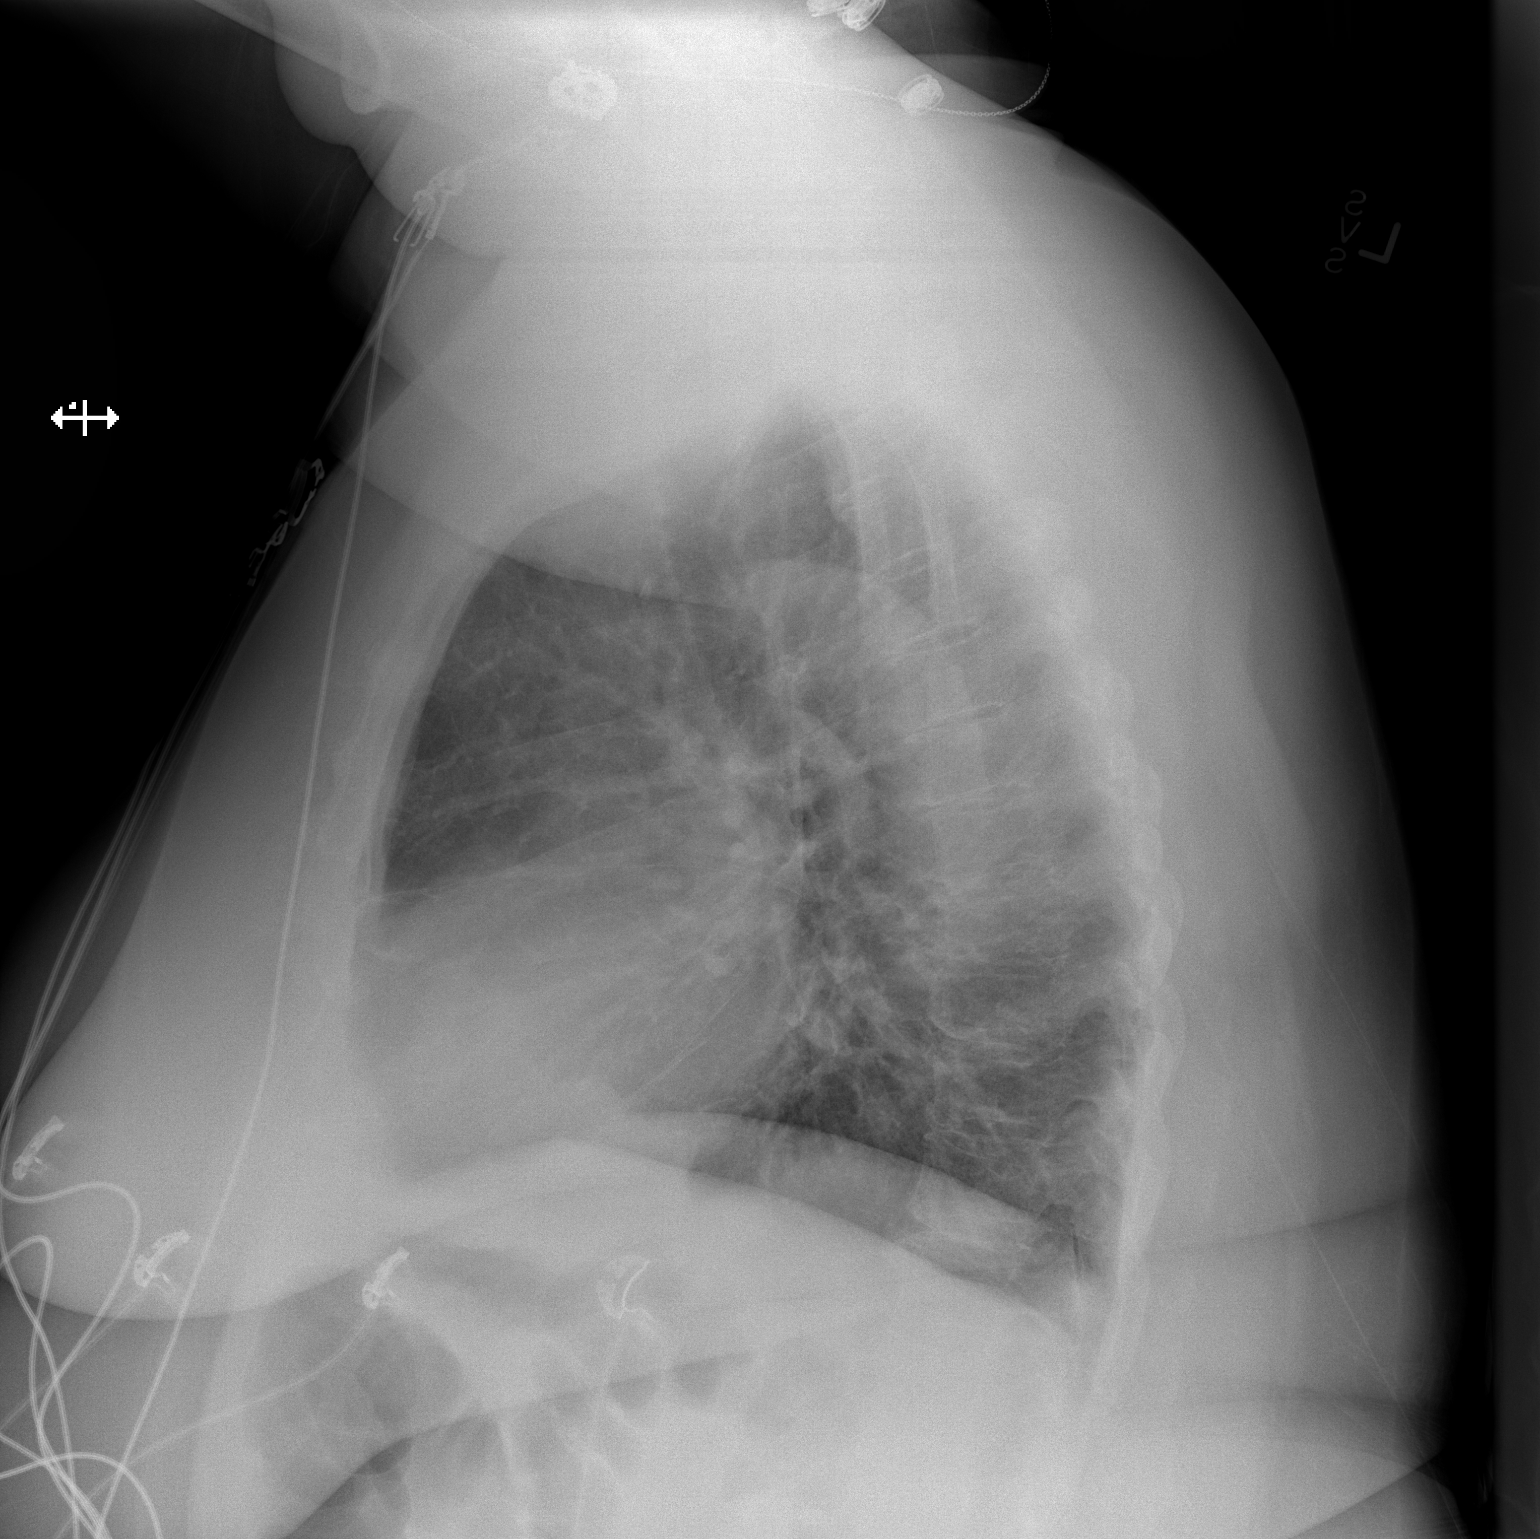

[2 of 2 positions shown; findings below may reference images not displayed]

FINDINGS: Mild cardiomegaly and vascular congestion appear stable. There is no
confluent airspace opacity, edema or significant pleural effusion.
There is stable linear scarring or atelectasis within the lingula,
best seen on the lateral view. Mild diffuse central airway
thickening is present. The osseous structures appear unchanged.
IMPRESSION: Stable mild chronic lung disease.  No acute cardiopulmonary process.

## 2014-11-28 ENCOUNTER — Telehealth: Payer: Self-pay

## 2014-11-28 DIAGNOSIS — M25519 Pain in unspecified shoulder: Secondary | ICD-10-CM

## 2014-11-28 NOTE — Addendum Note (Signed)
Addended by: Logan Bores on: 11/28/2014 03:38 PM   Modules accepted: Orders

## 2014-11-28 NOTE — Telephone Encounter (Signed)
Order placed for physical therapy, as per Jana Half is reasonable for joint pain.

## 2014-11-28 NOTE — Telephone Encounter (Signed)
Jana Half from [facility] called to inform us that they do not do Occupational Therapy at her location. Order should be changed to PT and placed under doctors name per guidelines vs NP or if OT is needed patient will need to go [address].  Please confirm if order placed on 11/16/14 should be for OT or PT

## 2014-11-28 NOTE — Telephone Encounter (Signed)
Pt with increased shoulder pain, whichever therapy they recommended due to OA and decreased ROM in bilateral shoulders

## 2014-12-12 ENCOUNTER — Ambulatory Visit (INDEPENDENT_AMBULATORY_CARE_PROVIDER_SITE_OTHER): Payer: Medicare Other | Admitting: Nurse Practitioner

## 2014-12-12 ENCOUNTER — Encounter: Payer: Self-pay | Admitting: Nurse Practitioner

## 2014-12-12 VITALS — BP 138/86 | HR 65 | Temp 98.0°F | Ht 60.0 in | Wt 244.0 lb

## 2014-12-12 DIAGNOSIS — M25519 Pain in unspecified shoulder: Secondary | ICD-10-CM | POA: Diagnosis not present

## 2014-12-12 DIAGNOSIS — F418 Other specified anxiety disorders: Secondary | ICD-10-CM

## 2014-12-12 DIAGNOSIS — I10 Essential (primary) hypertension: Secondary | ICD-10-CM

## 2014-12-12 DIAGNOSIS — F419 Anxiety disorder, unspecified: Secondary | ICD-10-CM

## 2014-12-12 DIAGNOSIS — F329 Major depressive disorder, single episode, unspecified: Secondary | ICD-10-CM

## 2014-12-12 DIAGNOSIS — Z720 Tobacco use: Secondary | ICD-10-CM | POA: Diagnosis not present

## 2014-12-12 MED ORDER — VILAZODONE HCL 40 MG PO TABS: 40.0000 mg | ORAL_TABLET | Freq: Every day | ORAL | Status: DC

## 2014-12-12 MED ORDER — TETANUS-DIPHTH-ACELL PERTUSSIS 5-2.5-18.5 LF-MCG/0.5 IM SUSP: 0.5000 mL | Freq: Once | INTRAMUSCULAR | Status: DC

## 2014-12-12 MED ORDER — METHOCARBAMOL 500 MG PO TABS
500.0000 mg | ORAL_TABLET | Freq: Four times a day (QID) | ORAL | Status: DC | PRN
Start: 2014-12-12 — End: 2014-12-14

## 2014-12-12 MED ORDER — ZOSTER VACCINE LIVE 19400 UNT/0.65ML ~~LOC~~ SOLR: 0.6500 mL | Freq: Once | SUBCUTANEOUS | Status: DC

## 2014-12-12 NOTE — Patient Instructions (Signed)
May take   Loratadine (generic Claritin) 10 mg daily for allergy symptoms   mucinex DM for cough and congestion- take 1 tablet twice daily as needed for cough and congestion  Strongly encourage therapy for anxiety and depression Generalized Anxiety Disorder Generalized anxiety disorder (GAD) is a mental disorder. It interferes with life functions, including relationships, work, and school. GAD is different from normal anxiety, which everyone experiences at some point in their lives in response to specific life events and activities. Normal anxiety actually helps [place] prepare for and get through these life events and activities. Normal anxiety goes away after the event or activity is over.  GAD causes anxiety that is not necessarily related to specific events or activities. It also causes excess anxiety in proportion to specific events or activities. The anxiety associated with GAD is also difficult to control. GAD can vary from mild to severe. People with severe GAD can have intense waves of anxiety with physical symptoms (panic attacks).  SYMPTOMS The anxiety and worry associated with GAD are difficult to control. This anxiety and worry are related to many life events and activities and also occur more days than not for 6 months or longer. People with GAD also have three or more of the following symptoms (one or more in children):  Restlessness.   Fatigue.  Difficulty concentrating.   Irritability.  Muscle tension.  Difficulty sleeping or unsatisfying sleep. DIAGNOSIS GAD is diagnosed through an assessment by your health care provider. Your health care provider will ask you questions aboutyour mood,physical symptoms, and events in your life. Your health care provider may ask you about your medical history and use of alcohol or drugs, including prescription medicines. Your health care provider may also do a physical exam and blood tests. Certain medical conditions and the use of certain  substances can cause symptoms similar to those associated with GAD. Your health care provider may refer you to a mental health specialist for further evaluation. TREATMENT The following therapies are usually used to treat GAD:   Medication. Antidepressant medication usually is prescribed for long-term daily control. Antianxiety medicines may be added in severe cases, especially when panic attacks occur.   Talk therapy (psychotherapy). Certain types of talk therapy can be helpful in treating GAD by providing support, education, and guidance. A form of talk therapy called cognitive behavioral therapy can teach you healthy ways to think about and react to daily life events and activities.  Stress managementtechniques. These include yoga, meditation, and exercise and can be very helpful when they are practiced regularly. A mental health specialist can help determine which treatment is best for you. Some people see improvement with one therapy. However, other people require a combination of therapies. Document Released: 12/06/2012 Document Revised: 12/26/2013 Document Reviewed: 12/06/2012 [facility] Patient Information 2015 [place], [place]. This information is not intended to replace advice given to you by your health care provider. Make sure you discuss any questions you have with your health care provider.

## 2014-12-12 NOTE — Progress Notes (Signed)
Patient ID: Tiffany [name], female   DOB: [birth year], 64 y.o.   MRN: [id number]    PCP: Lauree Chandler, NP  Allergies  Allergen Reactions  . Cymbalta [Duloxetine Hcl] Other (See Comments)    Sweating, bad dreams,    Chief Complaint  Patient presents with  . Follow-up    4 week follow-up on depression and anxiety  . URI    Cough- productive at times (yellowish)  . Immunizations    RXs printed for Shingles and TDaP Vaccine      HPI: Patient is a 64 y.o. female seen in the office today for evaluation anxiety. Pt titrated off zoloft and has started viibryd reports she feels like it helping Reports her daughter had a seizure last week- reports she has been worried about her mother. Very tearful during exam, has not went to any type of therapy.  Pt has had a bad cough, dry cough. Bringing up thick sputum. Hx of smoking. Been coughing 1-2 weeks, having chest discomfort when she cough. Does not hurt as bad. Has been taking tussin which has helped. No fever or malaise.  Has not gone to PT yet for shoulder but this is pending   Review of Systems:  Review of Systems  Constitutional: Negative for activity change, appetite change, fatigue and unexpected weight change.  HENT: Negative for congestion and hearing loss.   Eyes: Negative.   Respiratory: Negative for cough and shortness of breath.   Cardiovascular: Negative for chest pain, palpitations and leg swelling.  Gastrointestinal: Negative for abdominal pain, diarrhea and constipation.  Genitourinary: Negative for dysuria and difficulty urinating.  Musculoskeletal: Positive for myalgias and arthralgias (see HPI).  Skin: Negative for color change and wound.  Neurological: Negative for dizziness and weakness.  Psychiatric/Behavioral: Negative for behavioral problems, confusion and agitation. The patient is nervous/anxious.        Having anxiety and depression    Past Medical History  Diagnosis Date  . Arthritis    osteoarthritis  . Hx of bronchitis   . Hypertension   . GERD (gastroesophageal reflux disease)    Past Surgical History  Procedure Laterality Date  . Knee surgery      arthroscopic right  . Toe nail removal    . 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  She has a 5 pack-year smoking history. She has never used smokeless tobacco. She reports that she does not drink alcohol or use illicit drugs.  Family History  Problem Relation Age of Onset  . Heart disease Father     Died of CHF age 32  . Heart disease Mother     Died of CHF age 71  . Cancer Brother     Cancer  . Heart failure Brother     Died of CHF   . High blood pressure Son   . Colon cancer Neg Hx     Medications: Patient's Medications  New Prescriptions   No medications on file  Previous Medications   ACETAMINOPHEN (TYLENOL) 500 MG TABLET    Take 1,000 mg by mouth every 6 (six) hours as needed for moderate pain.   ALPRAZOLAM (XANAX) 0.5 MG TABLET    1/2 tablet- 1 tablet three times daily as needed for anxiety attacks   ASPIRIN 325 MG EC TABLET  Take 325 mg by mouth daily.   CLONIDINE (CATAPRES) 0.1 MG TABLET    Take 1 tablet by mouth daily.   DICLOFENAC SODIUM (VOLTAREN) 1 % GEL    Apply 2 g topically 4 (four) times daily.   HYDROCODONE-ACETAMINOPHEN (NORCO) 10-325 MG PER TABLET    Take 1 tablet by mouth every 6 (six) hours as needed.   LISINOPRIL (PRINIVIL,ZESTRIL) 10 MG TABLET    Take 1 tablet (10 mg total) by mouth daily.   METHOCARBAMOL (ROBAXIN) 500 MG TABLET    Take 1 tablet (500 mg total) by mouth every 6 (six) hours as needed for muscle spasms (For Knee Pain/Spasms).   PANTOPRAZOLE (PROTONIX) 40 MG TABLET    Take 1 tablet (40 mg total) by mouth daily.   SERTRALINE (ZOLOFT) 100 MG TABLET    Take 1 tablet (100 mg  total) by mouth daily.   VILAZODONE HCL 10 & 20 & 40 MG KIT    10 mg daily for 7 days, then 20 mg daily for 7 days, then 40 mg daily  Modified Medications   Modified Medication Previous Medication   TDAP (BOOSTRIX) 5-2.5-18.5 LF-MCG/0.5 INJECTION Tdap (BOOSTRIX) 5-2.5-18.5 LF-MCG/0.5 injection      Inject 0.5 mLs into the muscle once.    Inject 0.5 mLs into the muscle once.   ZOSTER VACCINE LIVE, PF, (ZOSTAVAX) 94496 UNT/0.65ML INJECTION zoster vaccine live, PF, (ZOSTAVAX) 75916 UNT/0.65ML injection      Inject 19,400 Units into the skin once.    Inject 0.65 mLs into the skin once.  Discontinued Medications   No medications on file     Physical Exam:  Filed Vitals:   12/12/14 0819  BP: 138/86  Pulse: 65  Temp: 98 F (36.7 C)  TempSrc: Oral  Height: 5' (1.524 m)  Weight: 244 lb (110.678 kg)  SpO2: 96%    Physical Exam  Constitutional: She is oriented to person, place, and time. She appears well-developed and well-nourished. No distress.  HENT:  Head: Normocephalic and atraumatic.  Neck: Normal range of motion. Neck supple.  Cardiovascular: Normal rate, regular rhythm and normal heart sounds.   Pulmonary/Chest: Effort normal and breath sounds normal.  Abdominal: Soft. Bowel sounds are normal.  Musculoskeletal: She exhibits no edema. Tenderness: with movement to right shoulder and right knee.       Right shoulder: She exhibits decreased range of motion.       Left shoulder: She exhibits decreased range of motion.  Neurological: She is alert and oriented to person, place, and time.  Skin: Skin is warm and dry. She is not diaphoretic.  Psychiatric: Her mood appears anxious.    Labs reviewed: Basic Metabolic Panel:  Recent Labs  05/13/14 0911 05/29/14 1611 08/23/14 0910 11/16/14 0956  NA 137 143 141 142  K 4.2 3.4* 4.2 4.2  CL 101 106 102 102  CO2 23  --  23 24  GLUCOSE 127* 129* 144* 116*  BUN _0 CREATININE 0.82 0.80 0.84 0.86  CALCIUM 9.0  --  9.1 9.1     Liver Function Tests:  Recent Labs  12/28/13 1209 08/23/14 0910  AST 15 12  ALT 12 17  ALKPHOS 134* 126*  BILITOT 0.3 0.4  PROT 7.3 7.3   No results for input(s): LIPASE, AMYLASE in the last 8760 hours. No results for input(s): AMMONIA in the last 8760 hours. CBC:  Recent Labs  12/28/13 1209 03/07/14 1240 05/13/14 0911 05/29/14 1611  WBC 5.9 5.2 7.7  --  NEUTROABS 3.8 3.4 5.7  --   HGB 12.5 12.2 12.4 15.0  HCT 39.3 37.8 39.6 44.0  MCV 83 80.8 84.4  --   PLT 279 259 263  --    Lipid Panel:  Recent Labs  12/28/13 1209  CHOL 114  HDL 40  LDLCALC 55  TRIG 96  CHOLHDL 2.9   TSH: No results for input(s): TSH in the last 8760 hours. A1C: Lab Results  Component Value Date   HGBA1C 6.9* 08/29/2013     Assessment/Plan  1. Pain in joint, shoulder region, unspecified laterality -has not gone to PT yet but appt pending.  - methocarbamol (ROBAXIN) 500 MG tablet; Take 1 tablet (500 mg total) by mouth every 6 (six) hours as needed for muscle spasms (For Knee Pain/Spasms).  Dispense: 120 tablet; Refill: 0  2. Anxiety and depression -pt with a lot of anxiety and depression due to social and situation. Viibryd has helped but pt still tearful during exam and using PRN xanax frequently.  -encouraged outpt therapy for anxiety and depression, this in combination with medication most likely to provide the best benefit.  - Vilazodone HCl (VIIBRYD) 40 MG TABS; Take 1 tablet (40 mg total) by mouth daily.  Dispense: 30 tablet; Refill: 3  3. Essential hypertension -controlled, conts on lisinopril and catapres daily  -discussed proper use of cough and cold medication with hypertension  4. Cough and congestion -appears to be related to allergies or cigarette smoking,  May take   Loratadine (generic Claritin) 10 mg daily for allergy symptoms  mucinex DM for cough and congestion- take 1 tablet twice daily as needed for cough and congestion  5. Tobacco abuse -encouraged to  cut back on smoking, pt with cough and congestion that tobacco use could be causing symptoms   To follow up in 2 months, sooner if needed

## 2014-12-14 ENCOUNTER — Other Ambulatory Visit: Payer: Self-pay | Admitting: *Deleted

## 2014-12-14 MED ORDER — BACLOFEN 10 MG PO TABS: ORAL_TABLET | ORAL | Status: DC

## 2014-12-14 NOTE — Telephone Encounter (Signed)
Received fax from [facility] [phone]) that insurance will not cover Methocarbam states that is is not covered. Non Formulary. Insurance prefers Tizanidine or Baclofen.  Per Sherrie Mustache, NP---Please call patient and make sure it is ok with her to change to Baclofen 5mg  One tablet by mouth every 8 hours as needed for muscle spasm (for knee pain/spasms) I called patient and she agreed with the change. Phoned into pharmacy.

## 2014-12-18 ENCOUNTER — Other Ambulatory Visit: Payer: Self-pay | Admitting: *Deleted

## 2014-12-18 DIAGNOSIS — F329 Major depressive disorder, single episode, unspecified: Secondary | ICD-10-CM

## 2014-12-18 DIAGNOSIS — M170 Bilateral primary osteoarthritis of knee: Secondary | ICD-10-CM

## 2014-12-18 DIAGNOSIS — F32A Depression, unspecified: Secondary | ICD-10-CM

## 2014-12-18 DIAGNOSIS — F419 Anxiety disorder, unspecified: Principal | ICD-10-CM

## 2014-12-18 DIAGNOSIS — G8929 Other chronic pain: Secondary | ICD-10-CM

## 2014-12-18 DIAGNOSIS — M25519 Pain in unspecified shoulder: Secondary | ICD-10-CM

## 2014-12-18 MED ORDER — HYDROCODONE-ACETAMINOPHEN 10-325 MG PO TABS: 1.0000 | ORAL_TABLET | Freq: Four times a day (QID) | ORAL | Status: DC | PRN

## 2014-12-18 MED ORDER — ALPRAZOLAM 0.5 MG PO TABS
ORAL_TABLET | ORAL | Status: DC
Start: 2014-12-18 — End: 2015-01-17

## 2014-12-18 NOTE — Telephone Encounter (Signed)
Patient Requested and will pick up 

## 2015-01-16 ENCOUNTER — Other Ambulatory Visit: Payer: Self-pay | Admitting: *Deleted

## 2015-01-17 ENCOUNTER — Other Ambulatory Visit: Payer: Self-pay | Admitting: *Deleted

## 2015-01-17 DIAGNOSIS — F329 Major depressive disorder, single episode, unspecified: Secondary | ICD-10-CM

## 2015-01-17 DIAGNOSIS — G8929 Other chronic pain: Secondary | ICD-10-CM

## 2015-01-17 DIAGNOSIS — M25519 Pain in unspecified shoulder: Secondary | ICD-10-CM

## 2015-01-17 DIAGNOSIS — M170 Bilateral primary osteoarthritis of knee: Secondary | ICD-10-CM

## 2015-01-17 DIAGNOSIS — F419 Anxiety disorder, unspecified: Principal | ICD-10-CM

## 2015-01-17 MED ORDER — HYDROCODONE-ACETAMINOPHEN 10-325 MG PO TABS: 1.0000 | ORAL_TABLET | Freq: Four times a day (QID) | ORAL | Status: DC | PRN

## 2015-01-17 MED ORDER — ALPRAZOLAM 0.5 MG PO TABS
ORAL_TABLET | ORAL | Status: DC
Start: 2015-01-17 — End: 2015-02-16

## 2015-01-17 NOTE — Telephone Encounter (Signed)
Patient requested and will pick up 

## 2015-02-13 ENCOUNTER — Ambulatory Visit: Payer: Medicare Other | Admitting: Nurse Practitioner

## 2015-02-13 ENCOUNTER — Encounter: Payer: Self-pay | Admitting: Nurse Practitioner

## 2015-02-16 ENCOUNTER — Other Ambulatory Visit: Payer: Self-pay | Admitting: *Deleted

## 2015-02-16 DIAGNOSIS — G8929 Other chronic pain: Secondary | ICD-10-CM

## 2015-02-16 DIAGNOSIS — M170 Bilateral primary osteoarthritis of knee: Secondary | ICD-10-CM

## 2015-02-16 DIAGNOSIS — M25519 Pain in unspecified shoulder: Secondary | ICD-10-CM

## 2015-02-16 DIAGNOSIS — F329 Major depressive disorder, single episode, unspecified: Secondary | ICD-10-CM

## 2015-02-16 DIAGNOSIS — F419 Anxiety disorder, unspecified: Principal | ICD-10-CM

## 2015-02-16 MED ORDER — HYDROCODONE-ACETAMINOPHEN 10-325 MG PO TABS: 1.0000 | ORAL_TABLET | Freq: Four times a day (QID) | ORAL | Status: DC | PRN

## 2015-02-16 MED ORDER — ALPRAZOLAM 0.5 MG PO TABS: ORAL_TABLET | ORAL | Status: DC

## 2015-02-16 NOTE — Telephone Encounter (Signed)
Patient walked in requesting refill. Printed

## 2015-02-20 ENCOUNTER — Ambulatory Visit: Payer: Medicare Other | Admitting: Nurse Practitioner

## 2015-02-22 ENCOUNTER — Ambulatory Visit: Payer: Medicare Other | Admitting: Nurse Practitioner

## 2015-03-01 ENCOUNTER — Ambulatory Visit: Payer: Medicare Other | Admitting: Nurse Practitioner

## 2015-03-16 ENCOUNTER — Other Ambulatory Visit: Payer: Self-pay | Admitting: *Deleted

## 2015-03-16 DIAGNOSIS — M25519 Pain in unspecified shoulder: Secondary | ICD-10-CM

## 2015-03-16 DIAGNOSIS — F329 Major depressive disorder, single episode, unspecified: Secondary | ICD-10-CM

## 2015-03-16 DIAGNOSIS — F419 Anxiety disorder, unspecified: Principal | ICD-10-CM

## 2015-03-16 DIAGNOSIS — G8929 Other chronic pain: Secondary | ICD-10-CM

## 2015-03-16 DIAGNOSIS — M170 Bilateral primary osteoarthritis of knee: Secondary | ICD-10-CM

## 2015-03-16 MED ORDER — BACLOFEN 10 MG PO TABS: ORAL_TABLET | ORAL | Status: DC

## 2015-03-16 MED ORDER — HYDROCODONE-ACETAMINOPHEN 10-325 MG PO TABS: 1.0000 | ORAL_TABLET | Freq: Four times a day (QID) | ORAL | Status: DC | PRN

## 2015-03-16 MED ORDER — ALPRAZOLAM 0.5 MG PO TABS: ORAL_TABLET | ORAL | Status: DC

## 2015-03-16 NOTE — Telephone Encounter (Signed)
Patient Requested and will pick up 

## 2015-03-22 ENCOUNTER — Ambulatory Visit: Payer: Medicare Other | Admitting: Nurse Practitioner

## 2015-03-28 DIAGNOSIS — M25561 Pain in right knee: Secondary | ICD-10-CM | POA: Diagnosis not present

## 2015-03-28 DIAGNOSIS — Z96652 Presence of left artificial knee joint: Secondary | ICD-10-CM | POA: Diagnosis not present

## 2015-03-28 DIAGNOSIS — M1711 Unilateral primary osteoarthritis, right knee: Secondary | ICD-10-CM | POA: Diagnosis not present

## 2015-03-28 DIAGNOSIS — Z4789 Encounter for other orthopedic aftercare: Secondary | ICD-10-CM | POA: Diagnosis not present

## 2015-04-16 ENCOUNTER — Other Ambulatory Visit: Payer: Self-pay | Admitting: *Deleted

## 2015-04-16 DIAGNOSIS — F32A Depression, unspecified: Secondary | ICD-10-CM

## 2015-04-16 DIAGNOSIS — F329 Major depressive disorder, single episode, unspecified: Secondary | ICD-10-CM

## 2015-04-16 DIAGNOSIS — G8929 Other chronic pain: Secondary | ICD-10-CM

## 2015-04-16 DIAGNOSIS — M25519 Pain in unspecified shoulder: Secondary | ICD-10-CM

## 2015-04-16 DIAGNOSIS — F419 Anxiety disorder, unspecified: Principal | ICD-10-CM

## 2015-04-16 DIAGNOSIS — M170 Bilateral primary osteoarthritis of knee: Secondary | ICD-10-CM

## 2015-04-16 MED ORDER — HYDROCODONE-ACETAMINOPHEN 10-325 MG PO TABS: 1.0000 | ORAL_TABLET | Freq: Four times a day (QID) | ORAL | Status: DC | PRN

## 2015-04-16 MED ORDER — BACLOFEN 10 MG PO TABS: ORAL_TABLET | ORAL | Status: DC

## 2015-04-16 MED ORDER — ALPRAZOLAM 0.5 MG PO TABS: ORAL_TABLET | ORAL | Status: DC

## 2015-04-16 NOTE — Telephone Encounter (Signed)
Patient requested and will pick up. I also explained to patient that she needs and appointment before anymore refills can be given after this one. She agreed to schedule an appointment.

## 2015-04-16 NOTE — Telephone Encounter (Signed)
Patient requested and will pick up. Explained to patient that she will need to schedule an appointment and be seen before anymore refills after this one is given. Patient agreed and stated that she will schedule appointment. Patient wanted a 2 month supply, but I told her we only give 30 day supply of these medications with no refills. She agreed and will pick up.

## 2015-05-15 ENCOUNTER — Encounter: Payer: Self-pay | Admitting: Nurse Practitioner

## 2015-05-15 ENCOUNTER — Other Ambulatory Visit: Payer: Self-pay | Admitting: *Deleted

## 2015-05-15 ENCOUNTER — Ambulatory Visit (INDEPENDENT_AMBULATORY_CARE_PROVIDER_SITE_OTHER): Payer: Medicare Other | Admitting: Nurse Practitioner

## 2015-05-15 VITALS — BP 146/98 | HR 73 | Temp 98.1°F | Resp 12 | Ht 60.0 in | Wt 235.0 lb

## 2015-05-15 DIAGNOSIS — K219 Gastro-esophageal reflux disease without esophagitis: Secondary | ICD-10-CM | POA: Diagnosis not present

## 2015-05-15 DIAGNOSIS — Z23 Encounter for immunization: Secondary | ICD-10-CM

## 2015-05-15 DIAGNOSIS — I10 Essential (primary) hypertension: Secondary | ICD-10-CM | POA: Diagnosis not present

## 2015-05-15 DIAGNOSIS — F418 Other specified anxiety disorders: Secondary | ICD-10-CM

## 2015-05-15 DIAGNOSIS — R739 Hyperglycemia, unspecified: Secondary | ICD-10-CM

## 2015-05-15 DIAGNOSIS — Z720 Tobacco use: Secondary | ICD-10-CM

## 2015-05-15 DIAGNOSIS — G8929 Other chronic pain: Secondary | ICD-10-CM | POA: Diagnosis not present

## 2015-05-15 DIAGNOSIS — F329 Major depressive disorder, single episode, unspecified: Secondary | ICD-10-CM

## 2015-05-15 DIAGNOSIS — F419 Anxiety disorder, unspecified: Secondary | ICD-10-CM

## 2015-05-15 MED ORDER — TETANUS-DIPHTH-ACELL PERTUSSIS 5-2.5-18.5 LF-MCG/0.5 IM SUSP: 0.5000 mL | Freq: Once | INTRAMUSCULAR | Status: DC

## 2015-05-15 MED ORDER — CLONIDINE HCL 0.1 MG PO TABS: 0.1000 mg | ORAL_TABLET | Freq: Every day | ORAL | Status: DC

## 2015-05-15 MED ORDER — ALPRAZOLAM 0.5 MG PO TABS: ORAL_TABLET | ORAL | Status: DC

## 2015-05-15 MED ORDER — VILAZODONE HCL 40 MG PO TABS: 40.0000 mg | ORAL_TABLET | Freq: Every day | ORAL | Status: DC

## 2015-05-15 MED ORDER — HYDROCODONE-ACETAMINOPHEN 10-325 MG PO TABS: 1.0000 | ORAL_TABLET | Freq: Four times a day (QID) | ORAL | Status: DC | PRN

## 2015-05-15 MED ORDER — ZOSTER VACCINE LIVE 19400 UNT/0.65ML ~~LOC~~ SOLR: 0.6500 mL | Freq: Once | SUBCUTANEOUS | Status: DC

## 2015-05-15 NOTE — Telephone Encounter (Signed)
Patient requested and faxed to pharmacy 

## 2015-05-15 NOTE — Patient Instructions (Signed)
Cont to work on eating right and exercise  Notify us after knee surgery for follow up -- will follow up in 4 months otherwise  Take medication as prescribe, contact your pharmacy for refills so you do not run out.

## 2015-05-15 NOTE — Telephone Encounter (Signed)
Patient's script for Xanax was placed in the shred due to the medication is not due until 05/17/2015, at that time it will be reprinted per Janett Billow. SRice RMA

## 2015-05-15 NOTE — Progress Notes (Signed)
Patient ID: Percival Spanish, female   DOB: [birth year], 64 y.o.   MRN: [id number]    PCP: Lauree Chandler, NP  Advanced Directive information Does patient have an advance directive?: No, Would patient like information on creating an advanced directive?: Yes - Educational materials given  Allergies  Allergen Reactions  . Cymbalta [Duloxetine Hcl] Other (See Comments)    Sweating, bad dreams,    Chief Complaint  Patient presents with  . Medical Management of Chronic Issues    5 month follow-up, no recent labs  . Medication Management    Discuss changing Hydrocodone to Percocet. Need rx's for clonidine and viibryd   . Urinary Tract Infection    Possible UTI, odor x couple weeks. Denies burning or discomfort     HPI: Patient is a 64 y.o. female seen in the office today for routine follow up.  Trying to lose weight, has cut back on fried food, more salads and fresh foods. Increased exercise Still dealing for family problems. Daughter with seizures and grandson died and does not feel like daughter is coping.  Has been taking norco for knee pain and had cortisone shot. Planning to have knee replacement, calling in the next few weeks. Wearing knee brace. Had gotten a pain prescription from her orthopedic last month for norco- pt reports number 30, pt already received 120 from our office on 04/16/15, wants to be changed to percocet.  Reports odor when urinating. No pain. No fever or chills. No increase in urination.  Increased acid reflux in the past, changes in diet helped this Taking protonix too.  Ran out of catapres 0.1 mg   Review of Systems:  Review of Systems  Constitutional: Negative for activity change, appetite change, fatigue and unexpected weight change.  HENT: Negative for congestion and hearing loss.   Eyes: Negative.   Respiratory: Negative for cough and shortness of breath.   Cardiovascular: Negative for chest pain, palpitations and leg swelling.  Gastrointestinal:  Negative for abdominal pain, diarrhea and constipation.  Genitourinary: Negative for dysuria and difficulty urinating.  Musculoskeletal: Positive for myalgias and arthralgias.       OA of knees  Skin: Negative for color change and wound.  Neurological: Negative for dizziness and weakness.  Psychiatric/Behavioral: Negative for behavioral problems, confusion and agitation. The patient is nervous/anxious.     Past Medical History  Diagnosis Date  . Arthritis     osteoarthritis  . Hx of bronchitis   . Hypertension   . GERD (gastroesophageal reflux disease)    Past Surgical History  Procedure Laterality Date  . Knee surgery      arthroscopic right  . Toe nail removal    . 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  She has a 5 pack-year smoking history. She has never used smokeless tobacco. She reports that she does not drink alcohol or use illicit drugs.  Family History  Problem Relation Age of Onset  . Heart disease Father     Died of CHF age 54  . Heart disease Mother     Died of CHF age 52  . Cancer Brother     Cancer  . Heart failure Brother     Died of CHF   . High blood pressure Son   . Colon cancer  Neg Hx     Medications: Patient's Medications  New Prescriptions   No medications on file  Previous Medications   ALPRAZOLAM (XANAX) 0.5 MG TABLET    1/2 tablet- 1 tablet three times daily as needed for anxiety attacks   ASPIRIN 325 MG EC TABLET    Take 325 mg by mouth daily.   BACLOFEN (LIORESAL) 10 MG TABLET    Take 1/2 tablet by mouth every 8 hours as needed for muscle spasms(for knee pain/spasms)   CLONIDINE (CATAPRES) 0.1 MG TABLET    Take 0.1 mg by mouth daily.   HYDROCODONE-ACETAMINOPHEN (NORCO) 10-325 MG PER TABLET    Take 1 tablet by mouth every 6 (six) hours as  needed.   LISINOPRIL (PRINIVIL,ZESTRIL) 10 MG TABLET    Take 1 tablet (10 mg total) by mouth daily.   PANTOPRAZOLE (PROTONIX) 40 MG TABLET    Take 1 tablet (40 mg total) by mouth daily.   VILAZODONE HCL (VIIBRYD) 40 MG TABS    Take 1 tablet (40 mg total) by mouth daily.  Modified Medications   Modified Medication Previous Medication   TDAP (BOOSTRIX) 5-2.5-18.5 LF-MCG/0.5 INJECTION Tdap (BOOSTRIX) 5-2.5-18.5 LF-MCG/0.5 injection      Inject 0.5 mLs into the muscle once.    Inject 0.5 mLs into the muscle once.   ZOSTER VACCINE LIVE, PF, (ZOSTAVAX) 77824 UNT/0.65ML INJECTION zoster vaccine live, PF, (ZOSTAVAX) 23536 UNT/0.65ML injection      Inject 19,400 Units into the skin once.    Inject 19,400 Units into the skin once.  Discontinued Medications   ACETAMINOPHEN (TYLENOL) 500 MG TABLET    Take 1,000 mg by mouth every 6 (six) hours as needed for moderate pain.   CLONIDINE (CATAPRES) 0.1 MG TABLET    Take 1 tablet by mouth daily.   DICLOFENAC SODIUM (VOLTAREN) 1 % GEL    Apply 2 g topically 4 (four) times daily.     Physical Exam:  Filed Vitals:   05/15/15 1033  BP: 146/98  Pulse: 73  Temp: 98.1 F (36.7 C)  TempSrc: Oral  Resp: 12  Height: 5' (1.524 m)  Weight: 235 lb (106.595 kg)  SpO2: 96%   Body mass index is 45.9 kg/(m^2).  Physical Exam  Constitutional: She is oriented to person, place, and time. She appears well-developed and well-nourished. No distress.  HENT:  Head: Normocephalic and atraumatic.  Mouth/Throat: Oropharynx is clear and moist. No oropharyngeal exudate.  Eyes: Conjunctivae are normal. Pupils are equal, round, and reactive to light.  Neck: Normal range of motion. Neck supple.  Cardiovascular: Normal rate, regular rhythm and normal heart sounds.   Pulmonary/Chest: Effort normal.  Diminished lung sounds throughout   Abdominal: Soft. Bowel sounds are normal.  Musculoskeletal: She exhibits no edema.  Neurological: She is alert and oriented to person, place,  and time.  Skin: Skin is warm and dry. She is not diaphoretic.  Psychiatric: She has a normal mood and affect.    Labs reviewed: Basic Metabolic Panel:  Recent Labs  05/29/14 1611 08/23/14 0910 11/16/14 0956  NA 143 141 142  K 3.4* 4.2 4.2  CL 106 102 102  CO2  --  23 24  GLUCOSE 129* 144* 116*  BUN 10 8 13   CREATININE 0.80 0.84 0.86  CALCIUM  --  9.1 9.1   Liver Function Tests:  Recent Labs  08/23/14 0910  AST 12  ALT 17  ALKPHOS 126*  BILITOT 0.4  PROT 7.3   No results for input(s): LIPASE, AMYLASE  in the last 8760 hours. No results for input(s): AMMONIA in the last 8760 hours. CBC:  Recent Labs  05/29/14 1611  HGB 15.0  HCT 44.0   Lipid Panel: No results for input(s): CHOL, HDL, LDLCALC, TRIG, CHOLHDL, LDLDIRECT in the last 8760 hours. TSH: No results for input(s): TSH in the last 8760 hours. A1C: Lab Results  Component Value Date   HGBA1C 6.9* 08/29/2013     Assessment/Plan 1. Essential hypertension -elevated today however did not take clonidine because she is out. Educated on getting prescriptions filled and not running out of medication.  - Comprehensive metabolic panel - CBC with Differential - cloNIDine (CATAPRES) 0.1 MG tablet; Take 1 tablet (0.1 mg total) by mouth daily.  Dispense: 30 tablet; Refill: 4  2. Gastroesophageal reflux disease without esophagitis -improved with diet modifications, and protonix 40 mg dailky   3. Chronic pain -of knees and shoulders due to OA.  -pt reports she has been following with ortho and last month received a pain prescription from orthopedic. Pt reports she is planning to have knee surgery soon. Needs to schedule. -will defer ALL pain prescriptions to orthopedic office at this time due to upcoming surgery and getting pain medication from their office last month.  -pain contract printed and given to patient.  -pt educated that will will not be providing additional refills.  - HYDROcodone-acetaminophen  ([place]) 10-325 MG per tablet; Take 1 tablet by mouth every 6 (six) hours as needed. Prescription to be obtained from orthopedic office.    4. Morbid obesity -pt has been attempting weight loss with healthy eating and increase in activity. To cont lifestyle modifications.  - Lipid panel - Hemoglobin A1c  5. Anxiety and depression -controlled on medication. To cont xanax as needed with viibryd - Vilazodone HCl (VIIBRYD) 40 MG TABS; Take 1 tablet (40 mg total) by mouth daily.  Dispense: 30 tablet; Refill: 3  6. Tobacco abuse To cont to cut back on cigarettes, smoking cessation   7. Hyperglycemia To cont heart healthy diet, low sugar diet.  - Hemoglobin A1c   To follow up in 4 months or after knee surgery.   Carlos American. Harle Battiest  [facility] [phone] 8 am - 5 pm) [phone] (after hours)

## 2015-05-16 LAB — HEMOGLOBIN A1C
Est. average glucose Bld gHb Est-mCnc: 148 mg/dL
Hgb A1c MFr Bld: 6.8 % — ABNORMAL HIGH (ref 4.8–5.6)

## 2015-05-16 LAB — COMPREHENSIVE METABOLIC PANEL
ALK PHOS: 124 IU/L — AB (ref 39–117)
ALT: 13 IU/L (ref 0–32)
AST: 11 IU/L (ref 0–40)
Albumin/Globulin Ratio: 1.2 (ref 1.1–2.5)
Albumin: 4.2 g/dL (ref 3.6–4.8)
BILIRUBIN TOTAL: 0.5 mg/dL (ref 0.0–1.2)
BUN/Creatinine Ratio: 14 (ref 11–26)
BUN: 13 mg/dL (ref 8–27)
CHLORIDE: 102 mmol/L (ref 97–108)
CO2: 25 mmol/L (ref 18–29)
Calcium: 9.4 mg/dL (ref 8.7–10.3)
Creatinine, Ser: 0.96 mg/dL (ref 0.57–1.00)
GFR calc Af Amer: 72 mL/min/{1.73_m2} (ref >59)
GFR calc non Af Amer: 63 mL/min/{1.73_m2} (ref >59)
GLUCOSE: 112 mg/dL — AB (ref 65–99)
Globulin, Total: 3.4 g/dL (ref 1.5–4.5)
Potassium: 4.2 mmol/L (ref 3.5–5.2)
Sodium: 143 mmol/L (ref 134–144)
Total Protein: 7.6 g/dL (ref 6.0–8.5)

## 2015-05-16 LAB — LIPID PANEL
CHOLESTEROL TOTAL: 144 mg/dL (ref 100–199)
Chol/HDL Ratio: 3.3 ratio units (ref 0.0–4.4)
HDL: 44 mg/dL (ref >39)
LDL Calculated: 78 mg/dL (ref 0–99)
Triglycerides: 112 mg/dL (ref 0–149)
VLDL CHOLESTEROL CAL: 22 mg/dL (ref 5–40)

## 2015-05-16 LAB — CBC WITH DIFFERENTIAL/PLATELET
BASOS: 0 %
Basophils Absolute: 0 10*3/uL (ref 0.0–0.2)
EOS (ABSOLUTE): 0.1 10*3/uL (ref 0.0–0.4)
EOS: 1 %
HEMATOCRIT: 42.2 % (ref 34.0–46.6)
HEMOGLOBIN: 13.6 g/dL (ref 11.1–15.9)
IMMATURE GRANULOCYTES: 0 %
Immature Grans (Abs): 0 10*3/uL (ref 0.0–0.1)
LYMPHS ABS: 1.6 10*3/uL (ref 0.7–3.1)
Lymphs: 28 %
MCH: 27.2 pg (ref 26.6–33.0)
MCHC: 32.2 g/dL (ref 31.5–35.7)
MCV: 84 fL (ref 79–97)
MONOS ABS: 0.3 10*3/uL (ref 0.1–0.9)
Monocytes: 6 %
NEUTROS PCT: 65 %
Neutrophils Absolute: 3.6 10*3/uL (ref 1.4–7.0)
Platelets: 280 10*3/uL (ref 150–379)
RBC: 5 x10E6/uL (ref 3.77–5.28)
RDW: 15.4 % (ref 12.3–15.4)
WBC: 5.6 10*3/uL (ref 3.4–10.8)

## 2015-05-17 ENCOUNTER — Other Ambulatory Visit: Payer: Self-pay | Admitting: *Deleted

## 2015-05-17 DIAGNOSIS — F329 Major depressive disorder, single episode, unspecified: Secondary | ICD-10-CM

## 2015-05-17 DIAGNOSIS — F419 Anxiety disorder, unspecified: Principal | ICD-10-CM

## 2015-05-17 MED ORDER — ALPRAZOLAM 0.5 MG PO TABS: ORAL_TABLET | ORAL | Status: DC

## 2015-05-17 NOTE — Telephone Encounter (Signed)
Patient requested and printed and pick up. Rx printed on 05/15/2015 was voided and tore up due to 1 day early

## 2015-06-05 ENCOUNTER — Telehealth: Payer: Self-pay

## 2015-06-05 DIAGNOSIS — R928 Other abnormal and inconclusive findings on diagnostic imaging of breast: Secondary | ICD-10-CM

## 2015-06-05 NOTE — Telephone Encounter (Signed)
Spoke with patient, patient states she was sick and was also dealing with a child that was very sick at the time these test (u/s and diagnostic mammogram of right breast) were placed. Patient forgot about going to have these test performed. Patient is ok if we place order now.   Orders placed

## 2015-06-05 NOTE — Telephone Encounter (Signed)
-----   Message from Lauree Chandler, NP sent at 06/05/2015 11:29 AM EDT ----- These were ordered last year to follow up possible mass on right breast, did she ever get this done?  ----- Message -----    From: SYSTEM    Sent: 06/05/2015  12:05 AM      To: Lauree Chandler, NP

## 2015-06-14 ENCOUNTER — Other Ambulatory Visit: Payer: Self-pay | Admitting: *Deleted

## 2015-06-14 DIAGNOSIS — F329 Major depressive disorder, single episode, unspecified: Secondary | ICD-10-CM

## 2015-06-14 DIAGNOSIS — F419 Anxiety disorder, unspecified: Principal | ICD-10-CM

## 2015-06-14 DIAGNOSIS — I10 Essential (primary) hypertension: Secondary | ICD-10-CM

## 2015-06-14 DIAGNOSIS — F32A Depression, unspecified: Secondary | ICD-10-CM

## 2015-06-14 MED ORDER — ALPRAZOLAM 0.5 MG PO TABS: ORAL_TABLET | ORAL | Status: DC

## 2015-06-14 MED ORDER — CLONIDINE HCL 0.1 MG PO TABS: 0.1000 mg | ORAL_TABLET | Freq: Every day | ORAL | Status: DC

## 2015-06-14 MED ORDER — BACLOFEN 10 MG PO TABS: ORAL_TABLET | ORAL | Status: DC

## 2015-06-14 NOTE — Telephone Encounter (Signed)
Patient requested refills due Thursday per Ivin Booty.

## 2015-06-15 ENCOUNTER — Ambulatory Visit
Admission: RE | Admit: 2015-06-15 | Discharge: 2015-06-15 | Disposition: A | Discharge status: Home / Self Care | Payer: Medicare Other | Source: Ambulatory Visit | Attending: Nurse Practitioner | Admitting: Nurse Practitioner

## 2015-06-15 DIAGNOSIS — R928 Other abnormal and inconclusive findings on diagnostic imaging of breast: Secondary | ICD-10-CM

## 2015-07-23 ENCOUNTER — Other Ambulatory Visit: Payer: Self-pay | Admitting: Nurse Practitioner

## 2015-07-23 ENCOUNTER — Other Ambulatory Visit: Payer: Self-pay | Admitting: Internal Medicine

## 2015-07-23 MED ORDER — BACLOFEN 10 MG PO TABS: ORAL_TABLET | ORAL | Status: DC

## 2015-07-23 NOTE — Telephone Encounter (Signed)
Patient requested to be faxed to pharmacy 

## 2015-08-16 ENCOUNTER — Other Ambulatory Visit: Payer: Self-pay | Admitting: Nurse Practitioner

## 2015-08-16 ENCOUNTER — Other Ambulatory Visit: Payer: Self-pay

## 2015-08-16 MED ORDER — ALPRAZOLAM 0.5 MG PO TABS: ORAL_TABLET | ORAL | Status: DC

## 2015-08-16 NOTE — Addendum Note (Signed)
Addended by: Logan Bores on: 08/16/2015 04:38 PM   Modules accepted: Orders

## 2015-08-16 NOTE — Telephone Encounter (Addendum)
Patient is leaving to go out of town 08/18/15 and will return 09/02/15, ? If xanax rx can be released sooner than due date of 08/22/15. [place], if ok please fax  Please advise

## 2015-08-16 NOTE — Telephone Encounter (Signed)
Per Janett Billow ok to fill this one time early. Patient aware this will not be a routine approved request to get medication prior to due date

## 2015-09-13 ENCOUNTER — Encounter: Payer: Self-pay | Admitting: Nurse Practitioner

## 2015-09-13 ENCOUNTER — Ambulatory Visit (INDEPENDENT_AMBULATORY_CARE_PROVIDER_SITE_OTHER): Payer: Medicare Other | Admitting: Nurse Practitioner

## 2015-09-13 VITALS — BP 142/90 | HR 81 | Temp 96.6°F | Resp 20 | Ht 60.0 in | Wt 247.4 lb

## 2015-09-13 DIAGNOSIS — F411 Generalized anxiety disorder: Secondary | ICD-10-CM

## 2015-09-13 DIAGNOSIS — G8929 Other chronic pain: Secondary | ICD-10-CM | POA: Diagnosis not present

## 2015-09-13 DIAGNOSIS — E119 Type 2 diabetes mellitus without complications: Secondary | ICD-10-CM

## 2015-09-13 DIAGNOSIS — Z720 Tobacco use: Secondary | ICD-10-CM | POA: Diagnosis not present

## 2015-09-13 DIAGNOSIS — I10 Essential (primary) hypertension: Secondary | ICD-10-CM | POA: Diagnosis not present

## 2015-09-13 DIAGNOSIS — K219 Gastro-esophageal reflux disease without esophagitis: Secondary | ICD-10-CM | POA: Diagnosis not present

## 2015-09-13 DIAGNOSIS — E1151 Type 2 diabetes mellitus with diabetic peripheral angiopathy without gangrene: Secondary | ICD-10-CM | POA: Insufficient documentation

## 2015-09-13 MED ORDER — BACLOFEN 10 MG PO TABS: ORAL_TABLET | ORAL | Status: DC

## 2015-09-13 MED ORDER — FLUOXETINE HCL 20 MG PO TABS: 20.0000 mg | ORAL_TABLET | Freq: Every day | ORAL | Status: DC

## 2015-09-13 MED ORDER — CLONIDINE HCL 0.1 MG PO TABS: 0.1000 mg | ORAL_TABLET | Freq: Every day | ORAL | Status: DC

## 2015-09-13 MED ORDER — LISINOPRIL 10 MG PO TABS: 10.0000 mg | ORAL_TABLET | Freq: Every day | ORAL | Status: DC

## 2015-09-13 NOTE — Progress Notes (Signed)
Patient ID: Tiffany [name], female   DOB: Dec 30, 1950, 65 y.o.   MRN: [id number]    PCP: Lauree Chandler, NP  Advanced Directive information    Allergies  Allergen Reactions  . Cymbalta [Duloxetine Hcl] Other (See Comments)    Sweating, bad dreams,    Chief Complaint  Patient presents with  . Medical Management of Chronic Issues    4 mo f/u, wants to increase her xanax     HPI: Patient is a 65 y.o. female seen in the office today for routine follow up on chronic conditions. Pt with hx of anxiety, chronic pain, hth,  Pt taking xanax three time daily because she has anxiety- does not feel like she is addicted to medication. Pt has tried Cymbalta in the past and had bad dreams. Has stopped taking viibryd did not feel like it was helping so she has stopped taking. Has tried zoloft in the past, did not like the effect. Does not feel like anxiety is well controlled.   Following with orthopedic for OA of right knee. Has put knee surgery off due to daughter having seizures. Has appt to follow up with ortho.   Acid reflux doing good on Protonix  Blood pressure at home- 147/79 Prescribed clonidine and lisinopril but  has not taking lisinopril in over a week.     Review of Systems:  Review of Systems  Constitutional: Negative for activity change, appetite change, fatigue and unexpected weight change.  HENT: Negative for congestion and hearing loss.   Eyes: Negative.   Respiratory: Negative for cough and shortness of breath.   Cardiovascular: Negative for chest pain, palpitations and leg swelling.  Gastrointestinal: Negative for abdominal pain, diarrhea and constipation.  Genitourinary: Negative for dysuria and difficulty urinating.  Musculoskeletal: Positive for myalgias and arthralgias.       OA of knees  Skin: Negative for color change and wound.  Neurological: Negative for dizziness and weakness.  Psychiatric/Behavioral: Negative for behavioral problems, confusion and  agitation. The patient is nervous/anxious.     Past Medical History  Diagnosis Date  . Arthritis     osteoarthritis  . Hx of bronchitis   . Hypertension   . GERD (gastroesophageal reflux disease)    Past Surgical History  Procedure Laterality Date  . Knee surgery      arthroscopic right  . Toe nail removal    . 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  She has a 5 pack-year smoking history. She has never used smokeless tobacco. She reports that she does not drink alcohol or use illicit drugs.  Family History  Problem Relation Age of Onset  . Heart disease Father     Died of CHF age 65  . Heart disease Mother     Died of CHF age 79  . Cancer Brother     Cancer  . Heart failure Brother     Died of CHF   . High blood pressure Son   . Colon cancer Neg Hx     Medications: Patient's Medications  New Prescriptions   No medications on file  Previous Medications   ALPRAZOLAM (XANAX) 0.5 MG TABLET    TAKE ONE-HALF TO ONE TABLET BY MOUTH THREE TIMES DAILY AS NEEDED FOR ANXIETY ATTACKS   ASPIRIN 325 MG  EC TABLET    Take 325 mg by mouth daily.   BACLOFEN (LIORESAL) 10 MG TABLET    Take 1/2 tablet by mouth every 8 hours as needed for muscle spasms(for knee pain/spasms)   CLONIDINE (CATAPRES) 0.1 MG TABLET    Take 1 tablet (0.1 mg total) by mouth daily.   HYDROCODONE-ACETAMINOPHEN (NORCO) 10-325 MG PER TABLET    Take 1 tablet by mouth every 6 (six) hours as needed. Prescription to be obtained from orthopedic office.   LISINOPRIL (PRINIVIL,ZESTRIL) 10 MG TABLET    Take 1 tablet (10 mg total) by mouth daily.   PANTOPRAZOLE (PROTONIX) 40 MG TABLET    Take 1 tablet (40 mg total) by mouth daily.   VILAZODONE HCL (VIIBRYD) 40 MG TABS    Take 1 tablet (40 mg total) by mouth daily.  Modified  Medications   No medications on file  Discontinued Medications   TDAP (BOOSTRIX) 5-2.5-18.5 LF-MCG/0.5 INJECTION    Inject 0.5 mLs into the muscle once.   ZOSTER VACCINE LIVE, PF, (ZOSTAVAX) 02725 UNT/0.65ML INJECTION    Inject 19,400 Units into the skin once.     Physical Exam:  Filed Vitals:   09/13/15 1019  BP: 142/90  Pulse: 81  Temp: 96.6 F (35.9 C)  TempSrc: Oral  Resp: 20  Height: 5' (1.524 m)  Weight: 247 lb 6.4 oz (112.22 kg)  SpO2: 97%   Body mass index is 48.32 kg/(m^2).  Physical Exam  Constitutional: She is oriented to person, place, and time. She appears well-developed and well-nourished. No distress.  HENT:  Head: Normocephalic and atraumatic.  Mouth/Throat: Oropharynx is clear and moist. No oropharyngeal exudate.  Eyes: Conjunctivae are normal. Pupils are equal, round, and reactive to light.  Neck: Normal range of motion. Neck supple.  Cardiovascular: Normal rate, regular rhythm and normal heart sounds.   Pulmonary/Chest: Effort normal.  Diminished lung sounds throughout   Abdominal: Soft. Bowel sounds are normal.  Musculoskeletal: She exhibits no edema.  Neurological: She is alert and oriented to person, place, and time.  Skin: Skin is warm and dry. She is not diaphoretic.  Psychiatric: She has a normal mood and affect.   Labs reviewed: Basic Metabolic Panel:  Recent Labs  11/16/14 0956 05/15/15 1108  NA 142 143  K 4.2 4.2  CL 102 102  CO2 24 25  GLUCOSE 116* 112*  BUN 13 13  CREATININE 0.86 0.96  CALCIUM 9.1 9.4   Liver Function Tests:  Recent Labs  05/15/15 1108  AST 11  ALT 13  ALKPHOS 124*  BILITOT 0.5  PROT 7.6  ALBUMIN 4.2   No results for input(s): LIPASE, AMYLASE in the last 8760 hours. No results for input(s): AMMONIA in the last 8760 hours. CBC:  Recent Labs  05/15/15 1108  WBC 5.6  NEUTROABS 3.6  HCT 42.2  MCV 84  PLT 280   Lipid Panel:  Recent Labs  05/15/15 1108  CHOL 144  HDL 44  LDLCALC 78  TRIG  112  CHOLHDL 3.3   TSH: No results for input(s): TSH in the last 8760 hours. A1C: Lab Results  Component Value Date   HGBA1C 6.8* 05/15/2015     Assessment/Plan 1. Essential hypertension -elevated today but pt has not been taking lisinopril, will provide refill. Encouraged compliance with medications so she can be treated properly.  - cloNIDine (CATAPRES) 0.1 MG tablet; Take 1 tablet (0.1 mg total) by mouth daily.  Dispense: 30 tablet; Refill: 4 - lisinopril (PRINIVIL,ZESTRIL) 10 MG  tablet; Take 1 tablet (10 mg total) by mouth daily.  Dispense: 30 tablet; Refill: 3 - Basic metabolic panel  2. Gastroesophageal reflux disease without esophagitis Stable, conts on protonix  3. Generalized anxiety disorder -ongoing anxiety, has quit taking viibryd. Discussed psych referral but pt would rather try another medication.  - FLUoxetine (PROZAC) 20 MG tablet; Take 1 tablet (20 mg total) by mouth daily.  Dispense: 30 tablet; Refill: 3  4. Chronic pain -ongoing and stable conts on norco (prescribed by ortho) and baclofen. - baclofen (LIORESAL) 10 MG tablet; Take 1/2 tablet by mouth every 8 hours as needed for muscle spasms(for knee pain/spasms)  Dispense: 30 each; Refill: 2  5. Tobacco abuse -conts to smoke, discussed smoking cessation  6. Type 2 diabetes mellitus without complication, without long-term current use of insulin (HCC) - not currently on medication, discussed diet modifications, to increase physical activity and weight loss.  - follow up Hemoglobin [id number] - Basic metabolic panel  To follow up in 4 weeks on anxiety   Jessica K. Harle Battiest  [facility] [phone] 8 am - 5 pm) [phone] (after hours)

## 2015-09-13 NOTE — Patient Instructions (Addendum)
Will start prozac 20 mg daily for anxiety Cont current dose of xanax  Make sure you are taking your blood pressure medication-- needing to take lisinopril and clonidine daily   Diabetes Mellitus and Food It is important for you to manage your blood sugar (glucose) level. Your blood glucose level can be greatly affected by what you eat. Eating healthier foods in the appropriate amounts throughout the day at about the same time each day will help you control your blood glucose level. It can also help slow or prevent worsening of your diabetes mellitus. Healthy eating may even help you improve the level of your blood pressure and reach or maintain a healthy weight.  General recommendations for healthful eating and cooking habits include:  Eating meals and snacks regularly. Avoid going long periods of time without eating to lose weight.  Eating a diet that consists mainly of plant-based foods, such as fruits, vegetables, nuts, legumes, and whole grains.  Using low-heat cooking methods, such as baking, instead of high-heat cooking methods, such as deep frying. Work with your dietitian to make sure you understand how to use the Nutrition Facts information on food labels. HOW CAN FOOD AFFECT ME? Carbohydrates Carbohydrates affect your blood glucose level more than any other type of food. Your dietitian will help you determine how many carbohydrates to eat at each meal and teach you how to count carbohydrates. Counting carbohydrates is important to keep your blood glucose at a healthy level, especially if you are using insulin or taking certain medicines for diabetes mellitus. Alcohol Alcohol can cause sudden decreases in blood glucose (hypoglycemia), especially if you use insulin or take certain medicines for diabetes mellitus. Hypoglycemia can be a life-threatening condition. Symptoms of hypoglycemia (sleepiness, dizziness, and disorientation) are similar to symptoms of having too much alcohol.  If  your health care provider has given you approval to drink alcohol, do so in moderation and use the following guidelines:  Women should not have more than one drink per day, and men should not have more than two drinks per day. One drink is equal to:  12 oz of beer.  5 oz of wine.  1 oz of hard liquor.  Do not drink on an empty stomach.  Keep yourself hydrated. Have water, diet soda, or unsweetened iced tea.  Regular soda, juice, and other mixers might contain a lot of carbohydrates and should be counted. WHAT FOODS ARE NOT RECOMMENDED? As you make food choices, it is important to remember that all foods are not the same. Some foods have fewer nutrients per serving than other foods, even though they might have the same number of calories or carbohydrates. It is difficult to get your body what it needs when you eat foods with fewer nutrients. Examples of foods that you should avoid that are high in calories and carbohydrates but low in nutrients include:  Trans fats (most processed foods list trans fats on the Nutrition Facts label).  Regular soda.  Juice.  Candy.  Sweets, such as cake, pie, doughnuts, and cookies.  Fried foods. WHAT FOODS CAN I EAT? Eat nutrient-rich foods, which will nourish your body and keep you healthy. The food you should eat also will depend on several factors, including:  The calories you need.  The medicines you take.  Your weight.  Your blood glucose level.  Your blood pressure level.  Your cholesterol level. You should eat a variety of foods, including:  Protein.  Lean cuts of meat.  Proteins low in  saturated fats, such as fish, egg whites, and beans. Avoid processed meats.  Fruits and vegetables.  Fruits and vegetables that may help control blood glucose levels, such as apples, mangoes, and yams.  Dairy products.  Choose fat-free or low-fat dairy products, such as milk, yogurt, and cheese.  Grains, bread, pasta, and  rice.  Choose whole grain products, such as multigrain bread, whole oats, and brown rice. These foods may help control blood pressure.  Fats.  Foods containing healthful fats, such as nuts, avocado, olive oil, canola oil, and fish. DOES EVERYONE WITH DIABETES MELLITUS HAVE THE SAME MEAL PLAN? Because every person with diabetes mellitus is different, there is not one meal plan that works for everyone. It is very important that you meet with a dietitian who will help you create a meal plan that is just right for you.   This information is not intended to replace advice given to you by your health care provider. Make sure you discuss any questions you have with your health care provider.   Document Released: 05/08/2005 Document Revised: 09/01/2014 Document Reviewed: 07/08/2013 Elsevier Interactive Patient Education [employer].

## 2015-09-24 ENCOUNTER — Telehealth: Payer: Self-pay

## 2015-09-24 NOTE — Telephone Encounter (Signed)
-----   Message from Lauree Chandler, NP sent at 09/18/2015 11:51 AM EST ----- Please have pt schedule lab appt to have labs obtained  ----- Message -----    From: SYSTEM    Sent: 09/18/2015  12:05 AM      To: Lauree Chandler, NP

## 2015-09-24 NOTE — Telephone Encounter (Signed)
Called spoke with patient about making a new lab appointment she said she would call back and make appointment herself for next week.

## 2015-09-24 NOTE — Telephone Encounter (Signed)
See notes

## 2015-10-18 ENCOUNTER — Ambulatory Visit: Payer: Medicare Other | Admitting: Nurse Practitioner

## 2015-10-23 ENCOUNTER — Encounter: Payer: Self-pay | Admitting: Nurse Practitioner

## 2015-10-23 ENCOUNTER — Ambulatory Visit (INDEPENDENT_AMBULATORY_CARE_PROVIDER_SITE_OTHER): Payer: Medicare Other | Admitting: Nurse Practitioner

## 2015-10-23 VITALS — BP 142/84 | HR 64 | Temp 98.3°F | Resp 20 | Ht 60.0 in | Wt 240.0 lb

## 2015-10-23 DIAGNOSIS — F411 Generalized anxiety disorder: Secondary | ICD-10-CM | POA: Diagnosis not present

## 2015-10-23 DIAGNOSIS — Z20828 Contact with and (suspected) exposure to other viral communicable diseases: Secondary | ICD-10-CM | POA: Diagnosis not present

## 2015-10-23 DIAGNOSIS — E119 Type 2 diabetes mellitus without complications: Secondary | ICD-10-CM | POA: Diagnosis not present

## 2015-10-23 DIAGNOSIS — I10 Essential (primary) hypertension: Secondary | ICD-10-CM

## 2015-10-23 MED ORDER — OSELTAMIVIR PHOSPHATE 75 MG PO CAPS: 75.0000 mg | ORAL_CAPSULE | Freq: Every day | ORAL | Status: DC

## 2015-10-23 MED ORDER — CLONIDINE HCL 0.1 MG PO TABS: 0.1000 mg | ORAL_TABLET | Freq: Every day | ORAL | Status: DC

## 2015-10-23 NOTE — Patient Instructions (Addendum)
Start taking tamiflu daily for 10 days  Please call psychiatrist for further evaluation and treatment of anxiety Also recommend psychologist for counseling which will help  Please take blood pressure ( at least 30 mins after you have taken your medication) and after you have been sitting at least 5 mins and record. Bring record with you to next visit.  Goal is blood pressure less than 140/90, notify if staying over  Low sodium diet    Diabetes Mellitus and Food It is important for you to manage your blood sugar (glucose) level. Your blood glucose level can be greatly affected by what you eat. Eating healthier foods in the appropriate amounts throughout the day at about the same time each day will help you control your blood glucose level. It can also help slow or prevent worsening of your diabetes mellitus. Healthy eating may even help you improve the level of your blood pressure and reach or maintain a healthy weight.  General recommendations for healthful eating and cooking habits include:  Eating meals and snacks regularly. Avoid going long periods of time without eating to lose weight.  Eating a diet that consists mainly of plant-based foods, such as fruits, vegetables, nuts, legumes, and whole grains.  Using low-heat cooking methods, such as baking, instead of high-heat cooking methods, such as deep frying. Work with your dietitian to make sure you understand how to use the Nutrition Facts information on food labels. HOW CAN FOOD AFFECT ME? Carbohydrates Carbohydrates affect your blood glucose level more than any other type of food. Your dietitian will help you determine how many carbohydrates to eat at each meal and teach you how to count carbohydrates. Counting carbohydrates is important to keep your blood glucose at a healthy level, especially if you are using insulin or taking certain medicines for diabetes mellitus. Alcohol Alcohol can cause sudden decreases in blood glucose  (hypoglycemia), especially if you use insulin or take certain medicines for diabetes mellitus. Hypoglycemia can be a life-threatening condition. Symptoms of hypoglycemia (sleepiness, dizziness, and disorientation) are similar to symptoms of having too much alcohol.  If your health care provider has given you approval to drink alcohol, do so in moderation and use the following guidelines:  Women should not have more than one drink per day, and men should not have more than two drinks per day. One drink is equal to:  12 oz of beer.  5 oz of wine.  1 oz of hard liquor.  Do not drink on an empty stomach.  Keep yourself hydrated. Have water, diet soda, or unsweetened iced tea.  Regular soda, juice, and other mixers might contain a lot of carbohydrates and should be counted. WHAT FOODS ARE NOT RECOMMENDED? As you make food choices, it is important to remember that all foods are not the same. Some foods have fewer nutrients per serving than other foods, even though they might have the same number of calories or carbohydrates. It is difficult to get your body what it needs when you eat foods with fewer nutrients. Examples of foods that you should avoid that are high in calories and carbohydrates but low in nutrients include:  Trans fats (most processed foods list trans fats on the Nutrition Facts label).  Regular soda.  Juice.  Candy.  Sweets, such as cake, pie, doughnuts, and cookies.  Fried foods. WHAT FOODS CAN I EAT? Eat nutrient-rich foods, which will nourish your body and keep you healthy. The food you should eat also will depend on several  factors, including:  The calories you need.  The medicines you take.  Your weight.  Your blood glucose level.  Your blood pressure level.  Your cholesterol level. You should eat a variety of foods, including:  Protein.  Lean cuts of meat.  Proteins low in saturated fats, such as fish, egg whites, and beans. Avoid processed  meats.  Fruits and vegetables.  Fruits and vegetables that may help control blood glucose levels, such as apples, mangoes, and yams.  Dairy products.  Choose fat-free or low-fat dairy products, such as milk, yogurt, and cheese.  Grains, bread, pasta, and rice.  Choose whole grain products, such as multigrain bread, whole oats, and brown rice. These foods may help control blood pressure.  Fats.  Foods containing healthful fats, such as nuts, avocado, olive oil, canola oil, and fish. DOES EVERYONE WITH DIABETES MELLITUS HAVE THE SAME MEAL PLAN? Because every person with diabetes mellitus is different, there is not one meal plan that works for everyone. It is very important that you meet with a dietitian who will help you create a meal plan that is just right for you.   This information is not intended to replace advice given to you by your health care provider. Make sure you discuss any questions you have with your health care provider.   Document Released: 05/08/2005 Document Revised: 09/01/2014 Document Reviewed: 07/08/2013 Elsevier Interactive Patient Education [employer].

## 2015-10-23 NOTE — Addendum Note (Signed)
Addended by: Eilene Ghazi on: 10/23/2015 11:39 AM   Modules accepted: Orders

## 2015-10-23 NOTE — Progress Notes (Signed)
Patient ID: Tiffany [name], female   DOB: [birth year], 65 y.o.   MRN: [id number]    PCP: Lauree Chandler, NP  Advanced Directive information Does patient have an advance directive?: No, Would patient like information on creating an advanced directive?: No - patient declined information  Allergies  Allergen Reactions  . Cymbalta [Duloxetine Hcl] Other (See Comments)    Sweating, bad dreams,    Chief Complaint  Patient presents with  . Medical Management of Chronic Issues    cough x 1 wk with chest tightness of and on  . Follow-up    anxiety      HPI: Patient is a 65 y.o. female seen in the office today for follow up anxiety. Pt with hx of anxiety, chronic pain, hypertension.  Pt taking xanax three time daily because she has anxiety- does not feel like she is addicted to medication. Pt has tried Cymbalta in the past and had bad dreams. Has stopped taking viibryd did not feel like it was helping so she has stopped taking. Has tried zoloft in the past, did not like the effect. Does not feel like anxiety is well controlled. Pt has been started on prozac does not feel like it is working.  A lot of factors at home that affect anxiety. Has not gone to therapist or counseling despite being encouraged to.   Pt reports several family members who live in house hold with flu like symptoms. Granddaughter with confirmed cause of influenza yesterday. Pt reports cough with clear sputum and chest tightness. No know fevers but feels achy.     Review of Systems:  Review of Systems  Constitutional: Negative for activity change, appetite change, fatigue and unexpected weight change.  HENT: Negative for congestion and hearing loss.   Eyes: Negative.   Respiratory: Positive for cough. Negative for shortness of breath.   Cardiovascular: Negative for chest pain, palpitations and leg swelling.  Gastrointestinal: Negative for abdominal pain, diarrhea and constipation.  Genitourinary: Negative for  dysuria and difficulty urinating.  Musculoskeletal: Positive for myalgias and arthralgias.       OA of knees  Skin: Negative for color change and wound.  Neurological: Negative for dizziness and weakness.  Psychiatric/Behavioral: Negative for behavioral problems, confusion and agitation. The patient is nervous/anxious.     Past Medical History  Diagnosis Date  . Arthritis     osteoarthritis  . Hx of bronchitis   . Hypertension   . GERD (gastroesophageal reflux disease)    Past Surgical History  Procedure Laterality Date  . Knee surgery      arthroscopic right  . Toe nail removal    . 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  She has a 5 pack-year smoking history. She has never used smokeless tobacco. She reports that she does not drink alcohol or use illicit drugs.  Family History  Problem Relation Age of Onset  . Heart disease Father     Died of CHF age 31  . Heart disease Mother     Died of CHF age 16  . Cancer Brother     Cancer  . Heart failure Brother     Died of CHF   . High blood pressure Son   . Colon cancer Neg Hx     Medications:  Patient's Medications  New Prescriptions   No medications on file  Previous Medications   ALPRAZOLAM (XANAX) 0.5 MG TABLET    TAKE ONE-HALF TO ONE TABLET BY MOUTH THREE TIMES DAILY AS NEEDED FOR ANXIETY ATTACKS   ASPIRIN 325 MG EC TABLET    Take 325 mg by mouth daily.   BACLOFEN (LIORESAL) 10 MG TABLET    Take 1/2 tablet by mouth every 8 hours as needed for muscle spasms(for knee pain/spasms)   CLONIDINE (CATAPRES) 0.1 MG TABLET    Take 1 tablet (0.1 mg total) by mouth daily.   FLUOXETINE (PROZAC) 20 MG TABLET    Take 1 tablet (20 mg total) by mouth daily.   HYDROCODONE-ACETAMINOPHEN (NORCO) 10-325 MG PER TABLET    Take 1 tablet by  mouth every 6 (six) hours as needed. Prescription to be obtained from orthopedic office.   LISINOPRIL (PRINIVIL,ZESTRIL) 10 MG TABLET    Take 1 tablet (10 mg total) by mouth daily.   PANTOPRAZOLE (PROTONIX) 40 MG TABLET    Take 1 tablet (40 mg total) by mouth daily.  Modified Medications   No medications on file  Discontinued Medications   No medications on file     Physical Exam:  Filed Vitals:   10/23/15 0833  BP: 142/84  Pulse: 64  Temp: 98.3 F (36.8 C)  TempSrc: Oral  Resp: 20  Height: 5' (1.524 m)  Weight: 240 lb (108.863 kg)  SpO2: 98%   Body mass index is 46.87 kg/(m^2).  Physical Exam  Constitutional: She is oriented to person, place, and time. She appears well-developed and well-nourished. No distress.  HENT:  Head: Normocephalic and atraumatic.  Mouth/Throat: Oropharynx is clear and moist. No oropharyngeal exudate.  Eyes: Conjunctivae are normal. Pupils are equal, round, and reactive to light.  Neck: Normal range of motion. Neck supple.  Cardiovascular: Normal rate, regular rhythm and normal heart sounds.   Pulmonary/Chest: Effort normal.  Diminished lung sounds throughout   Abdominal: Soft. Bowel sounds are normal.  Musculoskeletal: She exhibits no edema.  Neurological: She is alert and oriented to person, place, and time.  Skin: Skin is warm and dry. She is not diaphoretic.  Psychiatric: She has a normal mood and affect.   Labs reviewed: Basic Metabolic Panel:  Recent Labs  11/16/14 0956 05/15/15 1108  NA 142 143  K 4.2 4.2  CL 102 102  CO2 24 25  GLUCOSE 116* 112*  BUN 13 13  CREATININE 0.86 0.96  CALCIUM 9.1 9.4   Liver Function Tests:  Recent Labs  05/15/15 1108  AST 11  ALT 13  ALKPHOS 124*  BILITOT 0.5  PROT 7.6  ALBUMIN 4.2   No results for input(s): LIPASE, AMYLASE in the last 8760 hours. No results for input(s): AMMONIA in the last 8760 hours. CBC:  Recent Labs  05/15/15 1108  WBC 5.6  NEUTROABS 3.6  HCT 42.2  MCV 84   PLT 280   Lipid Panel:  Recent Labs  05/15/15 1108  CHOL 144  HDL 44  LDLCALC 78  TRIG 112  CHOLHDL 3.3   TSH: No results for input(s): TSH in the last 8760 hours. A1C: Lab Results  Component Value Date   HGBA1C 6.8* 05/15/2015     Assessment/Plan   1. Exposure to influenza -granddaughter who lives with pt tested positive yesterday, will give tamiflu prophylaxis  - oseltamivir (TAMIFLU) 75 MG capsule; Take 1 capsule (75 mg total) by mouth daily.  Dispense: 10 capsule; Refill: 0  2.  Generalized anxiety disorder -multiple medication attempted and not effective. Reports xanax only thing that helps. Recommended psych services.  -information given for counseling and for psychiatrist for further evaluation and treatment.   3. Essential hypertension -information given on low sodium diet, will have pt take blood pressure at home and bring log to next visit -to cont lisinopril and clonidine  - cloNIDine (CATAPRES) 0.1 MG tablet; Take 1 tablet (0.1 mg total) by mouth daily.  Dispense: 30 tablet; Refill: 4  To follow up in 2 months with Dr Eulas Post for routine follow up.    Carlos American. Harle Battiest  [facility] [phone] 8 am - 5 pm) [phone] (after hours)

## 2015-11-27 DIAGNOSIS — F39 Unspecified mood [affective] disorder: Secondary | ICD-10-CM | POA: Diagnosis not present

## 2015-11-27 DIAGNOSIS — Z79899 Other long term (current) drug therapy: Secondary | ICD-10-CM | POA: Diagnosis not present

## 2015-12-24 ENCOUNTER — Other Ambulatory Visit: Payer: Medicare Other

## 2015-12-28 ENCOUNTER — Ambulatory Visit: Payer: Medicare Other | Admitting: Internal Medicine

## 2016-01-17 ENCOUNTER — Other Ambulatory Visit: Payer: Self-pay | Admitting: Nurse Practitioner

## 2016-02-13 ENCOUNTER — Other Ambulatory Visit: Payer: Self-pay | Admitting: Nurse Practitioner

## 2016-03-03 ENCOUNTER — Telehealth: Payer: Self-pay

## 2016-03-03 NOTE — Telephone Encounter (Signed)
Will need follow up with Dr Mariea Clonts (or another doctor) prior to any 90 day refills

## 2016-03-03 NOTE — Telephone Encounter (Signed)
A note was faxed back to the pharmacy on the refill request that stated the following: "Patient must schedule an appointment with MD before 90 day refills will be given". This message was written on all three refill requests.

## 2016-03-03 NOTE — Telephone Encounter (Signed)
Patient has requested 90 day refills on the following medications. Pharmacy stated on refill fax that the increased amount would "improve adherence and less visits to pick-up".  Baclofen 10mg  tablets: Take one half tablet by mouth every 8 hours as needed for muscle spasm (for knee/spasm).   Lisinopril 10 mg tablets: Take one tablet by mouth once daily.   Catapres 0.1 mg tablet: Take one tablet by mouth once daily.   Last OV: 10/23/15 (patient was supposed to follow up in April)   No upcoming appointments scheduled.  Is it ok to refill these medications for the requested amounts without a scheduled appointment.   Please Advise.

## 2016-03-06 ENCOUNTER — Other Ambulatory Visit: Payer: Self-pay

## 2016-03-06 DIAGNOSIS — G8929 Other chronic pain: Secondary | ICD-10-CM

## 2016-03-06 DIAGNOSIS — I10 Essential (primary) hypertension: Secondary | ICD-10-CM

## 2016-03-06 MED ORDER — CLONIDINE HCL 0.1 MG PO TABS: 0.1000 mg | ORAL_TABLET | Freq: Every day | ORAL | Status: DC

## 2016-03-06 MED ORDER — LISINOPRIL 10 MG PO TABS: 10.0000 mg | ORAL_TABLET | Freq: Every day | ORAL | Status: DC

## 2016-03-06 MED ORDER — ALPRAZOLAM 0.5 MG PO TABS: ORAL_TABLET | ORAL | Status: DC

## 2016-03-06 MED ORDER — HYDROCODONE-ACETAMINOPHEN 10-325 MG PO TABS: 1.0000 | ORAL_TABLET | Freq: Four times a day (QID) | ORAL | Status: DC | PRN

## 2016-03-06 MED ORDER — PANTOPRAZOLE SODIUM 40 MG PO TBEC: 40.0000 mg | DELAYED_RELEASE_TABLET | Freq: Every day | ORAL | Status: DC

## 2016-03-06 MED ORDER — BACLOFEN 10 MG PO TABS: ORAL_TABLET | ORAL | Status: DC

## 2016-03-06 NOTE — Telephone Encounter (Signed)
Patient called requesting refill on all medications.   I printed rx for Hydrocodone and realized that rx is only to come from Orthopedic doctor. I called patient back and informed her to contact Ortho doctor for a refill on that medication. RX shredded. Other medications sent electronically.

## 2016-04-06 ENCOUNTER — Other Ambulatory Visit: Payer: Self-pay | Admitting: Nurse Practitioner

## 2016-04-07 ENCOUNTER — Telehealth: Payer: Self-pay

## 2016-04-07 NOTE — Telephone Encounter (Signed)
Needs appt with Dr Eulas Post before refill

## 2016-04-07 NOTE — Telephone Encounter (Signed)
Pharmacy sent request for refill on alprazolam 0.5 mg tablets. #60 with no refills.   Patient was last seen for OV on 10/23/15 and was instructed to follow up in 2 months with Dr. Eulas Post for routine follow up care. Patient did not schedule a follow up appointment.   Is it ok to refill this prescription?   Please advise.

## 2016-04-11 ENCOUNTER — Other Ambulatory Visit: Payer: Self-pay | Admitting: Nurse Practitioner

## 2016-04-11 NOTE — Telephone Encounter (Signed)
[address]

## 2016-04-18 ENCOUNTER — Ambulatory Visit: Payer: Self-pay | Admitting: Internal Medicine

## 2016-07-30 DIAGNOSIS — E1169 Type 2 diabetes mellitus with other specified complication: Secondary | ICD-10-CM | POA: Diagnosis not present

## 2016-07-30 DIAGNOSIS — M1711 Unilateral primary osteoarthritis, right knee: Secondary | ICD-10-CM | POA: Diagnosis not present

## 2016-07-30 DIAGNOSIS — E1142 Type 2 diabetes mellitus with diabetic polyneuropathy: Secondary | ICD-10-CM | POA: Diagnosis not present

## 2016-07-30 DIAGNOSIS — Z202 Contact with and (suspected) exposure to infections with a predominantly sexual mode of transmission: Secondary | ICD-10-CM | POA: Diagnosis not present

## 2016-07-30 DIAGNOSIS — Z23 Encounter for immunization: Secondary | ICD-10-CM | POA: Diagnosis not present

## 2016-07-30 DIAGNOSIS — N390 Urinary tract infection, site not specified: Secondary | ICD-10-CM | POA: Diagnosis not present

## 2016-07-30 DIAGNOSIS — N898 Other specified noninflammatory disorders of vagina: Secondary | ICD-10-CM | POA: Diagnosis not present

## 2016-07-30 DIAGNOSIS — Z0189 Encounter for other specified special examinations: Secondary | ICD-10-CM | POA: Diagnosis not present

## 2016-07-30 DIAGNOSIS — R5383 Other fatigue: Secondary | ICD-10-CM | POA: Diagnosis not present

## 2016-07-30 DIAGNOSIS — E669 Obesity, unspecified: Secondary | ICD-10-CM | POA: Diagnosis not present

## 2016-07-30 DIAGNOSIS — I10 Essential (primary) hypertension: Secondary | ICD-10-CM | POA: Diagnosis not present

## 2016-07-30 DIAGNOSIS — E119 Type 2 diabetes mellitus without complications: Secondary | ICD-10-CM | POA: Diagnosis not present

## 2016-07-30 DIAGNOSIS — N7689 Other specified inflammation of vagina and vulva: Secondary | ICD-10-CM | POA: Diagnosis not present

## 2016-07-30 DIAGNOSIS — B372 Candidiasis of skin and nail: Secondary | ICD-10-CM | POA: Diagnosis not present

## 2016-07-30 DIAGNOSIS — H538 Other visual disturbances: Secondary | ICD-10-CM | POA: Diagnosis not present

## 2016-07-30 DIAGNOSIS — Z6841 Body Mass Index (BMI) 40.0 and over, adult: Secondary | ICD-10-CM | POA: Diagnosis not present

## 2016-07-30 DIAGNOSIS — R51 Headache: Secondary | ICD-10-CM | POA: Diagnosis not present

## 2016-07-30 DIAGNOSIS — B379 Candidiasis, unspecified: Secondary | ICD-10-CM | POA: Diagnosis not present

## 2016-07-30 DIAGNOSIS — F419 Anxiety disorder, unspecified: Secondary | ICD-10-CM | POA: Diagnosis not present

## 2016-07-30 DIAGNOSIS — H539 Unspecified visual disturbance: Secondary | ICD-10-CM | POA: Diagnosis not present

## 2016-08-13 DIAGNOSIS — H9193 Unspecified hearing loss, bilateral: Secondary | ICD-10-CM | POA: Diagnosis not present

## 2016-08-13 DIAGNOSIS — R51 Headache: Secondary | ICD-10-CM | POA: Diagnosis not present

## 2016-08-13 DIAGNOSIS — H539 Unspecified visual disturbance: Secondary | ICD-10-CM | POA: Diagnosis not present

## 2016-08-13 DIAGNOSIS — K5909 Other constipation: Secondary | ICD-10-CM | POA: Diagnosis not present

## 2016-08-13 DIAGNOSIS — N3091 Cystitis, unspecified with hematuria: Secondary | ICD-10-CM | POA: Diagnosis not present

## 2016-08-13 DIAGNOSIS — M1711 Unilateral primary osteoarthritis, right knee: Secondary | ICD-10-CM | POA: Diagnosis not present

## 2016-08-13 DIAGNOSIS — L309 Dermatitis, unspecified: Secondary | ICD-10-CM | POA: Diagnosis not present

## 2016-08-13 DIAGNOSIS — E1165 Type 2 diabetes mellitus with hyperglycemia: Secondary | ICD-10-CM | POA: Diagnosis not present

## 2016-09-03 DIAGNOSIS — I10 Essential (primary) hypertension: Secondary | ICD-10-CM | POA: Diagnosis not present

## 2016-09-03 DIAGNOSIS — E1165 Type 2 diabetes mellitus with hyperglycemia: Secondary | ICD-10-CM | POA: Diagnosis not present

## 2016-09-03 DIAGNOSIS — F17200 Nicotine dependence, unspecified, uncomplicated: Secondary | ICD-10-CM | POA: Diagnosis not present

## 2016-09-03 DIAGNOSIS — E1142 Type 2 diabetes mellitus with diabetic polyneuropathy: Secondary | ICD-10-CM | POA: Diagnosis not present

## 2016-09-03 DIAGNOSIS — M1711 Unilateral primary osteoarthritis, right knee: Secondary | ICD-10-CM | POA: Diagnosis not present

## 2016-09-03 DIAGNOSIS — R51 Headache: Secondary | ICD-10-CM | POA: Diagnosis not present

## 2016-09-03 DIAGNOSIS — H9193 Unspecified hearing loss, bilateral: Secondary | ICD-10-CM | POA: Diagnosis not present

## 2016-09-03 DIAGNOSIS — L309 Dermatitis, unspecified: Secondary | ICD-10-CM | POA: Diagnosis not present

## 2016-09-26 DIAGNOSIS — B372 Candidiasis of skin and nail: Secondary | ICD-10-CM | POA: Diagnosis not present

## 2016-09-26 DIAGNOSIS — R252 Cramp and spasm: Secondary | ICD-10-CM | POA: Diagnosis not present

## 2016-09-26 DIAGNOSIS — E1142 Type 2 diabetes mellitus with diabetic polyneuropathy: Secondary | ICD-10-CM | POA: Diagnosis not present

## 2016-09-26 DIAGNOSIS — H539 Unspecified visual disturbance: Secondary | ICD-10-CM | POA: Diagnosis not present

## 2016-09-26 DIAGNOSIS — I160 Hypertensive urgency: Secondary | ICD-10-CM | POA: Diagnosis not present

## 2016-09-26 DIAGNOSIS — I10 Essential (primary) hypertension: Secondary | ICD-10-CM | POA: Diagnosis not present

## 2016-09-26 DIAGNOSIS — M1711 Unilateral primary osteoarthritis, right knee: Secondary | ICD-10-CM | POA: Diagnosis not present

## 2016-09-29 ENCOUNTER — Encounter: Payer: Self-pay | Admitting: Gastroenterology

## 2016-10-07 ENCOUNTER — Ambulatory Visit: Payer: Medicare Other | Admitting: Podiatry

## 2016-10-16 DIAGNOSIS — G8929 Other chronic pain: Secondary | ICD-10-CM | POA: Diagnosis not present

## 2016-10-16 DIAGNOSIS — R062 Wheezing: Secondary | ICD-10-CM | POA: Diagnosis not present

## 2016-10-16 DIAGNOSIS — G4733 Obstructive sleep apnea (adult) (pediatric): Secondary | ICD-10-CM | POA: Diagnosis not present

## 2016-10-16 DIAGNOSIS — M1711 Unilateral primary osteoarthritis, right knee: Secondary | ICD-10-CM | POA: Diagnosis not present

## 2016-10-16 DIAGNOSIS — I160 Hypertensive urgency: Secondary | ICD-10-CM | POA: Diagnosis not present

## 2016-10-16 DIAGNOSIS — E1142 Type 2 diabetes mellitus with diabetic polyneuropathy: Secondary | ICD-10-CM | POA: Diagnosis not present

## 2016-10-16 DIAGNOSIS — R51 Headache: Secondary | ICD-10-CM | POA: Diagnosis not present

## 2016-10-16 DIAGNOSIS — F419 Anxiety disorder, unspecified: Secondary | ICD-10-CM | POA: Diagnosis not present

## 2016-10-16 DIAGNOSIS — E1165 Type 2 diabetes mellitus with hyperglycemia: Secondary | ICD-10-CM | POA: Diagnosis not present

## 2016-10-16 DIAGNOSIS — I10 Essential (primary) hypertension: Secondary | ICD-10-CM | POA: Diagnosis not present

## 2016-10-17 DIAGNOSIS — M792 Neuralgia and neuritis, unspecified: Secondary | ICD-10-CM | POA: Diagnosis not present

## 2016-10-22 ENCOUNTER — Ambulatory Visit: Payer: Medicare Other | Admitting: Podiatry

## 2016-10-30 ENCOUNTER — Encounter (INDEPENDENT_AMBULATORY_CARE_PROVIDER_SITE_OTHER): Payer: Medicare Other | Admitting: Ophthalmology

## 2016-10-30 DIAGNOSIS — R0683 Snoring: Secondary | ICD-10-CM | POA: Diagnosis not present

## 2016-10-30 DIAGNOSIS — F17200 Nicotine dependence, unspecified, uncomplicated: Secondary | ICD-10-CM | POA: Diagnosis not present

## 2016-10-30 DIAGNOSIS — E1165 Type 2 diabetes mellitus with hyperglycemia: Secondary | ICD-10-CM | POA: Diagnosis not present

## 2016-10-30 DIAGNOSIS — E1142 Type 2 diabetes mellitus with diabetic polyneuropathy: Secondary | ICD-10-CM | POA: Diagnosis not present

## 2016-10-30 DIAGNOSIS — M1711 Unilateral primary osteoarthritis, right knee: Secondary | ICD-10-CM | POA: Diagnosis not present

## 2016-10-30 DIAGNOSIS — G8929 Other chronic pain: Secondary | ICD-10-CM | POA: Diagnosis not present

## 2016-10-30 DIAGNOSIS — I10 Essential (primary) hypertension: Secondary | ICD-10-CM | POA: Diagnosis not present

## 2016-10-30 DIAGNOSIS — B372 Candidiasis of skin and nail: Secondary | ICD-10-CM | POA: Diagnosis not present

## 2016-11-07 ENCOUNTER — Telehealth: Payer: Self-pay | Admitting: Nurse Practitioner

## 2016-11-07 ENCOUNTER — Other Ambulatory Visit (HOSPITAL_BASED_OUTPATIENT_CLINIC_OR_DEPARTMENT_OTHER): Payer: Self-pay

## 2016-11-07 DIAGNOSIS — R0683 Snoring: Secondary | ICD-10-CM

## 2016-11-07 DIAGNOSIS — R5383 Other fatigue: Secondary | ICD-10-CM

## 2016-11-07 NOTE — Telephone Encounter (Signed)
I spoke w/ pt on 08/22/16 to schedule AWV; states she has a team of trained physicians with a primary doctor and will no longer be seen at [facility]. VDM (DD)

## 2016-11-11 DIAGNOSIS — M545 Low back pain: Secondary | ICD-10-CM | POA: Diagnosis not present

## 2016-11-11 DIAGNOSIS — E1142 Type 2 diabetes mellitus with diabetic polyneuropathy: Secondary | ICD-10-CM | POA: Diagnosis not present

## 2016-11-11 DIAGNOSIS — F419 Anxiety disorder, unspecified: Secondary | ICD-10-CM | POA: Diagnosis not present

## 2016-11-11 DIAGNOSIS — I10 Essential (primary) hypertension: Secondary | ICD-10-CM | POA: Diagnosis not present

## 2016-11-11 DIAGNOSIS — M1711 Unilateral primary osteoarthritis, right knee: Secondary | ICD-10-CM | POA: Diagnosis not present

## 2016-11-14 ENCOUNTER — Ambulatory Visit: Payer: Medicare Other | Admitting: Podiatry

## 2016-11-14 DIAGNOSIS — M792 Neuralgia and neuritis, unspecified: Secondary | ICD-10-CM | POA: Diagnosis not present

## 2016-11-21 ENCOUNTER — Encounter (INDEPENDENT_AMBULATORY_CARE_PROVIDER_SITE_OTHER): Payer: Medicare Other | Admitting: Ophthalmology

## 2016-11-21 DIAGNOSIS — E113393 Type 2 diabetes mellitus with moderate nonproliferative diabetic retinopathy without macular edema, bilateral: Secondary | ICD-10-CM

## 2016-11-21 DIAGNOSIS — H353131 Nonexudative age-related macular degeneration, bilateral, early dry stage: Secondary | ICD-10-CM | POA: Diagnosis not present

## 2016-11-21 DIAGNOSIS — E11319 Type 2 diabetes mellitus with unspecified diabetic retinopathy without macular edema: Secondary | ICD-10-CM

## 2016-11-21 DIAGNOSIS — H43813 Vitreous degeneration, bilateral: Secondary | ICD-10-CM

## 2016-11-21 DIAGNOSIS — I10 Essential (primary) hypertension: Secondary | ICD-10-CM

## 2016-11-21 DIAGNOSIS — H35033 Hypertensive retinopathy, bilateral: Secondary | ICD-10-CM

## 2016-11-21 DIAGNOSIS — H2513 Age-related nuclear cataract, bilateral: Secondary | ICD-10-CM | POA: Diagnosis not present

## 2016-11-27 DIAGNOSIS — E1142 Type 2 diabetes mellitus with diabetic polyneuropathy: Secondary | ICD-10-CM | POA: Diagnosis not present

## 2016-11-27 DIAGNOSIS — R252 Cramp and spasm: Secondary | ICD-10-CM | POA: Diagnosis not present

## 2016-11-27 DIAGNOSIS — E1165 Type 2 diabetes mellitus with hyperglycemia: Secondary | ICD-10-CM | POA: Diagnosis not present

## 2016-11-27 DIAGNOSIS — M1711 Unilateral primary osteoarthritis, right knee: Secondary | ICD-10-CM | POA: Diagnosis not present

## 2016-11-27 DIAGNOSIS — Z760 Encounter for issue of repeat prescription: Secondary | ICD-10-CM | POA: Diagnosis not present

## 2016-11-27 DIAGNOSIS — I10 Essential (primary) hypertension: Secondary | ICD-10-CM | POA: Diagnosis not present

## 2016-11-27 DIAGNOSIS — G894 Chronic pain syndrome: Secondary | ICD-10-CM | POA: Diagnosis not present

## 2016-11-27 DIAGNOSIS — R0683 Snoring: Secondary | ICD-10-CM | POA: Diagnosis not present

## 2016-11-27 DIAGNOSIS — H539 Unspecified visual disturbance: Secondary | ICD-10-CM | POA: Diagnosis not present

## 2016-12-05 ENCOUNTER — Encounter: Payer: Self-pay | Admitting: Podiatry

## 2016-12-05 ENCOUNTER — Ambulatory Visit (INDEPENDENT_AMBULATORY_CARE_PROVIDER_SITE_OTHER): Payer: Medicare Other | Admitting: Podiatry

## 2016-12-05 VITALS — BP 177/108 | HR 65

## 2016-12-05 DIAGNOSIS — B351 Tinea unguium: Secondary | ICD-10-CM

## 2016-12-05 DIAGNOSIS — M79676 Pain in unspecified toe(s): Secondary | ICD-10-CM | POA: Diagnosis not present

## 2016-12-05 DIAGNOSIS — E119 Type 2 diabetes mellitus without complications: Secondary | ICD-10-CM | POA: Diagnosis not present

## 2016-12-05 NOTE — Progress Notes (Signed)
   Subjective:    Patient ID: Tiffany [name], female    DOB: [birth year], 66 y.o.   MRN: [id number]  HPI this patient presents to the office with chief complaint of long thick painful nails. Examination of her nails reveal she has not had her nails worked on for over a year. The nails are painful walking and wearing her shoes. This patient states that she is diabetic, type II and takes metformin for her diabetes. She was referred to this office by her medical doctor. She presents for preventative foot care services      Review of Systems  All other systems reviewed and are negative.      Objective:   Physical Exam GENERAL APPEARANCE: Alert, conversant. Appropriately groomed. No acute distress.  VASCULAR: Pedal pulses are  palpable at  [facility] and PT bilateral.  Capillary refill time is immediate to all digits,  Normal temperature gradient.   NEUROLOGIC: sensation is normal to 5.07 monofilament at 5/5 sites bilateral.  Light touch is intact bilateral, Muscle strength normal.  MUSCULOSKELETAL: acceptable muscle strength, tone and stability bilateral.  Intrinsic muscluature intact bilateral.  Rectus appearance of foot and digits noted bilateral.   DERMATOLOGIC: skin color, texture, and turgor are within normal limits.  No preulcerative lesions or ulcers  are seen, no interdigital maceration noted.  No open lesions present.  . No drainage noted.  NAILS  Thick disfigured discolored nails both feet.  Onychomycosis  B/L  Diabetes with no foot complications.  IE  Debride nails  RTC 3 months.   Gardiner Barefoot DPM         Assessment & Plan:

## 2016-12-08 ENCOUNTER — Ambulatory Visit (HOSPITAL_BASED_OUTPATIENT_CLINIC_OR_DEPARTMENT_OTHER): Payer: Medicare Other | Attending: Internal Medicine | Admitting: Cardiovascular Disease

## 2016-12-08 VITALS — Ht 62.0 in | Wt 221.0 lb

## 2016-12-08 DIAGNOSIS — G4733 Obstructive sleep apnea (adult) (pediatric): Secondary | ICD-10-CM | POA: Diagnosis not present

## 2016-12-08 DIAGNOSIS — R400 Somnolence: Secondary | ICD-10-CM | POA: Diagnosis not present

## 2016-12-08 DIAGNOSIS — R0902 Hypoxemia: Secondary | ICD-10-CM | POA: Diagnosis not present

## 2016-12-08 DIAGNOSIS — R0683 Snoring: Secondary | ICD-10-CM

## 2016-12-08 DIAGNOSIS — R5383 Other fatigue: Secondary | ICD-10-CM

## 2016-12-19 DIAGNOSIS — G894 Chronic pain syndrome: Secondary | ICD-10-CM | POA: Diagnosis not present

## 2016-12-19 DIAGNOSIS — Z79891 Long term (current) use of opiate analgesic: Secondary | ICD-10-CM | POA: Diagnosis not present

## 2016-12-19 DIAGNOSIS — M79643 Pain in unspecified hand: Secondary | ICD-10-CM | POA: Diagnosis not present

## 2016-12-19 DIAGNOSIS — M545 Low back pain: Secondary | ICD-10-CM | POA: Diagnosis not present

## 2016-12-19 DIAGNOSIS — M25569 Pain in unspecified knee: Secondary | ICD-10-CM | POA: Diagnosis not present

## 2016-12-19 DIAGNOSIS — Z79899 Other long term (current) drug therapy: Secondary | ICD-10-CM | POA: Diagnosis not present

## 2017-01-19 NOTE — Procedures (Signed)
    Patient Name: Tiffany [name], Tiffany [name] Date: 12/08/2016 Gender: Female D.O.B: Feb 08, 1951 Age (years): 20 Referring Provider: Latanya Presser MD Height (inches): 62 Interpreting Physician: Shelva Majestic MD, ABSM Weight (lbs): 221 RPSGT: Madelon Lips BMI: 40 MRN: [id number] Neck Size: 14.50  CLINICAL INFORMATION Sleep Study Type: NPSG  Indication for sleep study: Fatigue, Snoring  Epworth Sleepiness Score: 14  SLEEP STUDY TECHNIQUE As per the AASM Manual for the Scoring of Sleep and Associated Events v2.3 (April 2016) with a hypopnea requiring 4% desaturations.  The channels recorded and monitored were frontal, central and occipital EEG, electrooculogram (EOG), submentalis EMG (chin), nasal and oral airflow, thoracic and abdominal wall motion, anterior tibialis EMG, snore microphone, electrocardiogram, and pulse oximetry.  MEDICATIONS ALPRAZolam (XANAX) 0.5 MG tablet aspirin 325 MG EC tablet baclofen (LIORESAL) 10 MG tablet cloNIDine (CATAPRES) 0.1 MG tablet HYDROcodone-acetaminophen (NORCO) 10-325 MG tablet HYDROcodone-acetaminophen (NORCO) 10-325 MG tablet lisinopril (PRINIVIL,ZESTRIL) 10 MG tablet oseltamivir (TAMIFLU) 75 MG capsule pantoprazole (PROTONIX) 40 MG tablet  Medications self-administered by patient taken the night of the study : N/A  SLEEP ARCHITECTURE The study was initiated at 10:02:37 PM and ended at 4:15:52 AM.  Sleep onset time was 11.3 minutes and the sleep efficiency was 77.8%. The total sleep time was 290.5 minutes.  Stage REM latency was 62.5 minutes.  The patient spent 20.83% of the night in stage N1 sleep, 61.27% in stage N2 sleep, 0.00% in stage N3 and 17.90% in REM.  Alpha intrusion was absent.  Supine sleep was 1.72%.  RESPIRATORY PARAMETERS The overall apnea/hypopnea index (AHI) was 8.1 per hour. There were 1 total apneas, including 1 obstructive, 0 central and 0 mixed apneas. There were 38 hypopneas and 4 RERAs.  The AHI during  Stage REM sleep was 25.4 per hour.  AHI while supine was 0.0 per hour.  The mean oxygen saturation was 91.75%. The minimum SpO2 during sleep was 74.00%.  Loud snoring was noted during this study.  CARDIAC DATA The 2 lead EKG demonstrated sinus rhythm. The mean heart rate was 65.33 beats per minute. Other EKG findings include: PVCs.  LEG MOVEMENT DATA The total PLMS were 35 with a resulting PLMS index of 7.23. Associated arousal with leg movement index was 1.0 .  IMPRESSIONS - Mild obstructive sleep apnea overall (AHI 8.1/h); however, sleep apnea was moderate during REM sleep (AHI 25.4/h) - No significant central sleep apnea occurred during this study (CAI = 0.0/h). - Significant  oxygen desaturation to a nadir of 81% during NREM and 74% during REM sleep. - The patient snored with Loud snoring volume. - EKG findings include PVCs. - Mild periodic limb movements of sleep occurred during the study. No significant associated arousals.  DIAGNOSIS - Obstructive Sleep Apnea (327.23 [G47.33 ICD-10]) - Nocturnal Hypoxemia (327.26 [G47.36 ICD-10])  RECOMMENDATIONS - Recommend therapeutic CPAP titration to determine optimal pressure required to alleviate sleep disordered breathing. - Efforts should be made to optimize nasal and oropharyngeal patency. - Avoid alcohol, sedatives and other CNS depressants that may worsen sleep apnea and disrupt normal sleep architecture. - Sleep hygiene should be reviewed to assess factors that may improve sleep quality. - Weight management (BMI 40) and regular exercise should be initiated.  [Electronically signed] 01/19/2017 11:54 PM  Shelva Majestic MD,FACC, ABSM Diplomate, American Board of Sleep Medicine   NPI: [id number] [place] PH: [phone]   FX: [phone] [place]

## 2017-01-27 DIAGNOSIS — M79609 Pain in unspecified limb: Secondary | ICD-10-CM | POA: Diagnosis not present

## 2017-01-27 DIAGNOSIS — M545 Low back pain: Secondary | ICD-10-CM | POA: Diagnosis not present

## 2017-02-03 DIAGNOSIS — M545 Low back pain: Secondary | ICD-10-CM | POA: Diagnosis not present

## 2017-02-03 DIAGNOSIS — M79643 Pain in unspecified hand: Secondary | ICD-10-CM | POA: Diagnosis not present

## 2017-02-03 DIAGNOSIS — G894 Chronic pain syndrome: Secondary | ICD-10-CM | POA: Diagnosis not present

## 2017-02-03 DIAGNOSIS — Z79899 Other long term (current) drug therapy: Secondary | ICD-10-CM | POA: Diagnosis not present

## 2017-02-03 DIAGNOSIS — Z79891 Long term (current) use of opiate analgesic: Secondary | ICD-10-CM | POA: Diagnosis not present

## 2017-02-03 DIAGNOSIS — M25569 Pain in unspecified knee: Secondary | ICD-10-CM | POA: Diagnosis not present

## 2017-02-20 ENCOUNTER — Other Ambulatory Visit: Payer: Self-pay | Admitting: Physician Assistant

## 2017-02-20 ENCOUNTER — Ambulatory Visit
Admission: RE | Admit: 2017-02-20 | Discharge: 2017-02-20 | Disposition: A | Discharge status: Home / Self Care | Payer: Medicare Other | Source: Ambulatory Visit | Attending: Physician Assistant | Admitting: Physician Assistant

## 2017-02-20 DIAGNOSIS — M545 Low back pain: Secondary | ICD-10-CM

## 2017-02-20 DIAGNOSIS — M25569 Pain in unspecified knee: Secondary | ICD-10-CM

## 2017-02-20 DIAGNOSIS — M1711 Unilateral primary osteoarthritis, right knee: Secondary | ICD-10-CM | POA: Diagnosis not present

## 2017-02-20 DIAGNOSIS — M48061 Spinal stenosis, lumbar region without neurogenic claudication: Secondary | ICD-10-CM | POA: Diagnosis not present

## 2017-03-03 DIAGNOSIS — Z79899 Other long term (current) drug therapy: Secondary | ICD-10-CM | POA: Diagnosis not present

## 2017-03-03 DIAGNOSIS — M25569 Pain in unspecified knee: Secondary | ICD-10-CM | POA: Diagnosis not present

## 2017-03-03 DIAGNOSIS — Z79891 Long term (current) use of opiate analgesic: Secondary | ICD-10-CM | POA: Diagnosis not present

## 2017-03-03 DIAGNOSIS — M545 Low back pain: Secondary | ICD-10-CM | POA: Diagnosis not present

## 2017-03-03 DIAGNOSIS — M79643 Pain in unspecified hand: Secondary | ICD-10-CM | POA: Diagnosis not present

## 2017-03-03 DIAGNOSIS — G894 Chronic pain syndrome: Secondary | ICD-10-CM | POA: Diagnosis not present

## 2017-03-13 ENCOUNTER — Ambulatory Visit: Payer: Medicare Other | Admitting: Podiatry

## 2017-03-18 DIAGNOSIS — M47817 Spondylosis without myelopathy or radiculopathy, lumbosacral region: Secondary | ICD-10-CM | POA: Diagnosis not present

## 2017-03-30 ENCOUNTER — Ambulatory Visit (INDEPENDENT_AMBULATORY_CARE_PROVIDER_SITE_OTHER): Payer: Medicare Other | Admitting: Ophthalmology

## 2017-04-03 DIAGNOSIS — M25569 Pain in unspecified knee: Secondary | ICD-10-CM | POA: Diagnosis not present

## 2017-04-03 DIAGNOSIS — Z79899 Other long term (current) drug therapy: Secondary | ICD-10-CM | POA: Diagnosis not present

## 2017-04-03 DIAGNOSIS — G894 Chronic pain syndrome: Secondary | ICD-10-CM | POA: Diagnosis not present

## 2017-04-03 DIAGNOSIS — M545 Low back pain: Secondary | ICD-10-CM | POA: Diagnosis not present

## 2017-04-03 DIAGNOSIS — M79643 Pain in unspecified hand: Secondary | ICD-10-CM | POA: Diagnosis not present

## 2017-04-03 DIAGNOSIS — Z79891 Long term (current) use of opiate analgesic: Secondary | ICD-10-CM | POA: Diagnosis not present

## 2017-05-04 DIAGNOSIS — M47817 Spondylosis without myelopathy or radiculopathy, lumbosacral region: Secondary | ICD-10-CM | POA: Diagnosis not present

## 2017-05-04 DIAGNOSIS — M545 Low back pain: Secondary | ICD-10-CM | POA: Diagnosis not present

## 2017-05-04 DIAGNOSIS — M79643 Pain in unspecified hand: Secondary | ICD-10-CM | POA: Diagnosis not present

## 2017-05-04 DIAGNOSIS — Z79891 Long term (current) use of opiate analgesic: Secondary | ICD-10-CM | POA: Diagnosis not present

## 2017-05-04 DIAGNOSIS — Z79899 Other long term (current) drug therapy: Secondary | ICD-10-CM | POA: Diagnosis not present

## 2017-05-04 DIAGNOSIS — M25569 Pain in unspecified knee: Secondary | ICD-10-CM | POA: Diagnosis not present

## 2017-05-04 DIAGNOSIS — G894 Chronic pain syndrome: Secondary | ICD-10-CM | POA: Diagnosis not present

## 2017-06-02 DIAGNOSIS — M47816 Spondylosis without myelopathy or radiculopathy, lumbar region: Secondary | ICD-10-CM | POA: Diagnosis not present

## 2017-06-02 DIAGNOSIS — M25569 Pain in unspecified knee: Secondary | ICD-10-CM | POA: Diagnosis not present

## 2017-06-02 DIAGNOSIS — G894 Chronic pain syndrome: Secondary | ICD-10-CM | POA: Diagnosis not present

## 2017-06-02 DIAGNOSIS — Z79899 Other long term (current) drug therapy: Secondary | ICD-10-CM | POA: Diagnosis not present

## 2017-06-02 DIAGNOSIS — Z79891 Long term (current) use of opiate analgesic: Secondary | ICD-10-CM | POA: Diagnosis not present

## 2017-06-02 DIAGNOSIS — M79643 Pain in unspecified hand: Secondary | ICD-10-CM | POA: Diagnosis not present

## 2017-07-02 DIAGNOSIS — Z79899 Other long term (current) drug therapy: Secondary | ICD-10-CM | POA: Diagnosis not present

## 2017-07-02 DIAGNOSIS — M47816 Spondylosis without myelopathy or radiculopathy, lumbar region: Secondary | ICD-10-CM | POA: Diagnosis not present

## 2017-07-02 DIAGNOSIS — Z79891 Long term (current) use of opiate analgesic: Secondary | ICD-10-CM | POA: Diagnosis not present

## 2017-07-02 DIAGNOSIS — M79643 Pain in unspecified hand: Secondary | ICD-10-CM | POA: Diagnosis not present

## 2017-07-02 DIAGNOSIS — G894 Chronic pain syndrome: Secondary | ICD-10-CM | POA: Diagnosis not present

## 2017-07-02 DIAGNOSIS — M179 Osteoarthritis of knee, unspecified: Secondary | ICD-10-CM | POA: Diagnosis not present

## 2017-07-02 DIAGNOSIS — M25569 Pain in unspecified knee: Secondary | ICD-10-CM | POA: Diagnosis not present

## 2017-07-08 DIAGNOSIS — E782 Mixed hyperlipidemia: Secondary | ICD-10-CM | POA: Diagnosis not present

## 2017-07-08 DIAGNOSIS — E119 Type 2 diabetes mellitus without complications: Secondary | ICD-10-CM | POA: Diagnosis not present

## 2017-07-08 DIAGNOSIS — I10 Essential (primary) hypertension: Secondary | ICD-10-CM | POA: Diagnosis not present

## 2017-07-23 DIAGNOSIS — E559 Vitamin D deficiency, unspecified: Secondary | ICD-10-CM | POA: Diagnosis not present

## 2017-07-23 DIAGNOSIS — E1142 Type 2 diabetes mellitus with diabetic polyneuropathy: Secondary | ICD-10-CM | POA: Diagnosis not present

## 2017-07-23 DIAGNOSIS — R8290 Unspecified abnormal findings in urine: Secondary | ICD-10-CM | POA: Diagnosis not present

## 2017-07-23 DIAGNOSIS — E1165 Type 2 diabetes mellitus with hyperglycemia: Secondary | ICD-10-CM | POA: Diagnosis not present

## 2017-07-23 DIAGNOSIS — I10 Essential (primary) hypertension: Secondary | ICD-10-CM | POA: Diagnosis not present

## 2017-07-23 DIAGNOSIS — Z23 Encounter for immunization: Secondary | ICD-10-CM | POA: Diagnosis not present

## 2017-07-23 DIAGNOSIS — F419 Anxiety disorder, unspecified: Secondary | ICD-10-CM | POA: Diagnosis not present

## 2017-07-30 DIAGNOSIS — M47816 Spondylosis without myelopathy or radiculopathy, lumbar region: Secondary | ICD-10-CM | POA: Diagnosis not present

## 2017-07-30 DIAGNOSIS — Z79899 Other long term (current) drug therapy: Secondary | ICD-10-CM | POA: Diagnosis not present

## 2017-07-30 DIAGNOSIS — M25569 Pain in unspecified knee: Secondary | ICD-10-CM | POA: Diagnosis not present

## 2017-07-30 DIAGNOSIS — G894 Chronic pain syndrome: Secondary | ICD-10-CM | POA: Diagnosis not present

## 2017-07-30 DIAGNOSIS — M545 Low back pain: Secondary | ICD-10-CM | POA: Diagnosis not present

## 2017-07-30 DIAGNOSIS — Z79891 Long term (current) use of opiate analgesic: Secondary | ICD-10-CM | POA: Diagnosis not present

## 2017-12-20 ENCOUNTER — Emergency Department (HOSPITAL_COMMUNITY): Payer: Medicare Other

## 2017-12-20 ENCOUNTER — Other Ambulatory Visit: Payer: Self-pay

## 2017-12-20 ENCOUNTER — Encounter (HOSPITAL_COMMUNITY): Payer: Self-pay

## 2017-12-20 ENCOUNTER — Emergency Department (HOSPITAL_COMMUNITY)
Admission: EM | Admit: 2017-12-20 | Discharge: 2017-12-20 | Disposition: A | Discharge status: Home / Self Care | Payer: Medicare Other | Attending: Emergency Medicine | Admitting: Emergency Medicine

## 2017-12-20 DIAGNOSIS — Z96652 Presence of left artificial knee joint: Secondary | ICD-10-CM | POA: Diagnosis not present

## 2017-12-20 DIAGNOSIS — I10 Essential (primary) hypertension: Secondary | ICD-10-CM | POA: Diagnosis not present

## 2017-12-20 DIAGNOSIS — R202 Paresthesia of skin: Secondary | ICD-10-CM

## 2017-12-20 DIAGNOSIS — E119 Type 2 diabetes mellitus without complications: Secondary | ICD-10-CM | POA: Insufficient documentation

## 2017-12-20 DIAGNOSIS — R0602 Shortness of breath: Secondary | ICD-10-CM | POA: Insufficient documentation

## 2017-12-20 DIAGNOSIS — F17210 Nicotine dependence, cigarettes, uncomplicated: Secondary | ICD-10-CM | POA: Diagnosis not present

## 2017-12-20 DIAGNOSIS — Z79899 Other long term (current) drug therapy: Secondary | ICD-10-CM | POA: Diagnosis not present

## 2017-12-20 LAB — CBC WITH DIFFERENTIAL/PLATELET
BASOS PCT: 0 %
Basophils Absolute: 0 10*3/uL (ref 0.0–0.1)
EOS ABS: 0.1 10*3/uL (ref 0.0–0.7)
Eosinophils Relative: 1 %
HCT: 39.7 % (ref 36.0–46.0)
HEMOGLOBIN: 12.5 g/dL (ref 12.0–15.0)
Lymphocytes Relative: 20 %
Lymphs Abs: 1.2 10*3/uL (ref 0.7–4.0)
MCH: 26.6 pg (ref 26.0–34.0)
MCHC: 31.5 g/dL (ref 30.0–36.0)
MCV: 84.5 fL (ref 78.0–100.0)
MONO ABS: 0.4 10*3/uL (ref 0.1–1.0)
MONOS PCT: 7 %
NEUTROS PCT: 72 %
Neutro Abs: 4.2 10*3/uL (ref 1.7–7.7)
Platelets: 164 10*3/uL (ref 150–400)
RBC: 4.7 MIL/uL (ref 3.87–5.11)
RDW: 14.5 % (ref 11.5–15.5)
WBC: 5.8 10*3/uL (ref 4.0–10.5)

## 2017-12-20 LAB — COMPREHENSIVE METABOLIC PANEL
ALBUMIN: 3.4 g/dL — AB (ref 3.5–5.0)
ALT: 15 U/L (ref 14–54)
ANION GAP: 12 (ref 5–15)
AST: 19 U/L (ref 15–41)
Alkaline Phosphatase: 91 U/L (ref 38–126)
BUN: 17 mg/dL (ref 6–20)
CHLORIDE: 108 mmol/L (ref 101–111)
CO2: 20 mmol/L — AB (ref 22–32)
Calcium: 9.1 mg/dL (ref 8.9–10.3)
Creatinine, Ser: 0.87 mg/dL (ref 0.44–1.00)
GFR calc Af Amer: >60 mL/min (ref >60)
GFR calc non Af Amer: >60 mL/min (ref >60)
GLUCOSE: 91 mg/dL (ref 65–99)
POTASSIUM: 4.3 mmol/L (ref 3.5–5.1)
SODIUM: 140 mmol/L (ref 135–145)
Total Bilirubin: 0.8 mg/dL (ref 0.3–1.2)
Total Protein: 8 g/dL (ref 6.5–8.1)

## 2017-12-20 MED ORDER — PREDNISONE 20 MG PO TABS
20.0000 mg | ORAL_TABLET | Freq: Once | ORAL | Status: AC
  Administered 2017-12-20: 20 mg via ORAL
  Filled 2017-12-20: qty 1

## 2017-12-20 MED ORDER — PREDNISONE 10 MG PO TABS: 20.0000 mg | ORAL_TABLET | Freq: Every day | ORAL | 0 refills | Status: DC

## 2017-12-20 NOTE — ED Triage Notes (Signed)
EMS reports from home.right hand tingling and SOB since last night 2200. [place]oke screen negative enroute. Hx of hypertension. Pt states out of medication x 1 week, and since hasn't felt right. Has current Rx but has not refilled.  BP 174/104 HR 82 Resp 18 Sp02 98% RA CBG 140

## 2017-12-20 NOTE — ED Provider Notes (Signed)
[facility] DEPT Provider Note   CSN: [id number] Arrival date & time: 12/20/17  1425     History   Chief Complaint Chief Complaint  Patient presents with  . Tingling  . Shortness of Breath    HPI Tiffany [name] is a 67 y.o. female.  Patient complains of tingling in her hand some in her neck and arm.  She also had some shortness of breath.  This was in her right arm.  Patient has history of arthritis in her hand and neck  The history is provided by the patient. No language interpreter was used.  Illness  This is a new problem. The current episode started 2 days ago. The problem occurs constantly. The problem has not changed since onset.Pertinent negatives include no chest pain, no abdominal pain and no headaches. Nothing aggravates the symptoms. Nothing relieves the symptoms. She has tried nothing for the symptoms.    Past Medical History:  Diagnosis Date  . Arthritis    osteoarthritis  . GERD (gastroesophageal reflux disease)   . Hx of bronchitis   . Hypertension     Patient Active Problem List   Diagnosis Date Noted  . Diabetes ([place]) 09/13/2015  . Generalized anxiety disorder 06/15/2014  . Chronic pain 12/28/2013  . GERD (gastroesophageal reflux disease) 09/01/2013  . Tobacco abuse 09/01/2013  . Chest pain 08/29/2013  . Hypertension   . Expected blood loss anemia 06/01/2013  . Morbid obesity ([place]) 06/01/2013  . 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 Preop cardiovascular exam 02/18/2013    Past Surgical History:  Procedure Laterality Date  . KNEE SURGERY     arthroscopic right  . toe nail removal    . TOTAL KNEE ARTHROPLASTY Left 05/31/2013   Procedure: LEFT TOTAL KNEE ARTHROPLASTY;  Surgeon: Mauri Pole, MD;  Location: [facility];  Service: Orthopedics;  Laterality: Left;     OB History   None      Home Medications    Prior to Admission medications   Medication Sig Start Date End Date Taking? Authorizing Provider  cloNIDine  (CATAPRES) 0.1 MG tablet Take 1 tablet (0.1 mg total) by mouth daily. 03/06/16  Yes Lauree Chandler, NP  lisinopril (PRINIVIL,ZESTRIL) 40 MG tablet TK 1 T PO QD 10/21/17  Yes [provider]  oxyCODONE-acetaminophen (PERCOCET) 7.5-325 MG tablet TK 1 T PO QID PRN PAIN 12/14/17  Yes [provider]  ALPRAZolam (XANAX) 0.5 MG tablet TAKE ONE-HALF TO ONE TABLET BY MOUTH THREE TIMES DAILY AS NEEDED FOR ANXIETY ATTACKS Patient not taking: Reported on 12/20/2017 03/06/16   Lauree Chandler, NP  baclofen (LIORESAL) 10 MG tablet TAKE ONE-HALF TABLET BY MOUTH EVERY 8 HOURS AS NEEDED FOR MUSCLE SPASM (FOR KNEE/SPASM) 04/11/16   Lauree Chandler, NP  HYDROcodone-acetaminophen (NORCO) 10-325 MG tablet Take 1 tablet by mouth every 6 (six) hours as needed. Prescription to be obtained from orthopedic office. Patient not taking: Reported on 12/20/2017 03/06/16   Lauree Chandler, NP  HYDROcodone-acetaminophen (NORCO) 10-325 MG tablet Take 1 tablet by mouth every 6 (six) hours as needed. Prescription to be obtained from orthopedic office. Patient not taking: Reported on 12/20/2017 03/06/16   Lauree Chandler, NP  lisinopril (PRINIVIL,ZESTRIL) 10 MG tablet Take 1 tablet (10 mg total) by mouth daily. Patient not taking: Reported on 12/20/2017 03/06/16   Lauree Chandler, NP  oseltamivir (TAMIFLU) 75 MG capsule Take 1 capsule (75 mg total) by mouth daily. Patient not taking: Reported on 12/20/2017  10/23/15   Lauree Chandler, NP  pantoprazole (PROTONIX) 40 MG tablet Take 1 tablet (40 mg total) by mouth daily. Patient not taking: Reported on 12/20/2017 03/06/16   Lauree Chandler, NP  predniSONE (DELTASONE) 10 MG tablet Take 2 tablets (20 mg total) by mouth daily. 12/20/17   Milton Ferguson, MD    Family History Family History  Problem Relation Age of Onset  . Heart disease Father        Died of CHF age 24  . Heart disease Mother        Died of CHF age 1  . Cancer Brother        Cancer  .  Heart failure Brother        Died of CHF   . High blood pressure Son   . Colon cancer Neg Hx     Social History Social History   Tobacco Use  . Smoking status: Current Every Day Smoker    Packs/day: 0.25    Years: 20.00    Pack years: 5.00    Types: Cigarettes  . Smokeless tobacco: Never Used  . Tobacco comment: 5 cig daily   Substance Use Topics  . Alcohol use: No    Alcohol/week: 0.0 oz  . Drug use: No     Allergies   Cymbalta [duloxetine hcl]   Review of Systems Review of Systems  Constitutional: Negative for appetite change and fatigue.  HENT: Negative for congestion, ear discharge and sinus pressure.   Eyes: Negative for discharge.  Respiratory: Negative for cough.   Cardiovascular: Negative for chest pain.  Gastrointestinal: Negative for abdominal pain and diarrhea.  Genitourinary: Negative for frequency and hematuria.  Musculoskeletal: Negative for back pain.       Numbness and tingling in hand on the right  Skin: Negative for rash.  Neurological: Negative for seizures and headaches.  Psychiatric/Behavioral: Negative for hallucinations.     Physical Exam Updated Vital Signs BP (!) 157/82   Pulse 78   Temp 98.4 F (36.9 C) (Oral)   Resp 16   Ht 5\' 2"  (1.575 m)   Wt 102.1 kg (225 lb)   SpO2 98%   BMI 41.15 kg/m   Physical Exam  Constitutional: She is oriented to person, place, and time. She appears well-developed.  HENT:  Head: Normocephalic.  Eyes: Conjunctivae and EOM are normal. No scleral icterus.  Neck: Neck supple. No thyromegaly present.  Cardiovascular: Normal rate and regular rhythm. Exam reveals no gallop and no friction rub.  No murmur heard. Pulmonary/Chest: No stridor. She has no wheezes. She has no rales. She exhibits no tenderness.  Abdominal: She exhibits no distension. There is no tenderness. There is no rebound.  Genitourinary:  Genitourinary Comments: Severe arthritis to right hand  Musculoskeletal: Normal range of motion.  She exhibits no edema.  Lymphadenopathy:    She has no cervical adenopathy.  Neurological: She is oriented to person, place, and time. She exhibits normal muscle tone. Coordination normal.  Skin: No rash noted. No erythema.  Psychiatric: She has a normal mood and affect. Her behavior is normal.     ED Treatments / Results  Labs (all labs ordered are listed, but only abnormal results are displayed) Labs Reviewed  COMPREHENSIVE METABOLIC PANEL - Abnormal; Notable for the following components:      Result Value   CO2 20 (*)    Albumin 3.4 (*)    All other components within normal limits  CBC WITH DIFFERENTIAL/PLATELET  EKG None  Radiology Dg Chest 2 View  Result Date: 12/20/2017 CLINICAL DATA:  Shortness of breath since last night. EXAM: CHEST - 2 VIEW COMPARISON:  05/29/2014 FINDINGS: Lungs are adequately inflated and otherwise clear. Mild stable cardiomegaly. Minimal calcified plaque over the aortic arch. Minimal degenerate change of the spine. IMPRESSION: No active cardiopulmonary disease. Electronically Signed   By: Marin Olp M.D.   On: 12/20/2017 16:30   Dg Cervical Spine Complete  Result Date: 12/20/2017 CLINICAL DATA:  Right hand tingling and shortness of breath since last night. EXAM: CERVICAL SPINE - COMPLETE 4+ VIEW COMPARISON:  None. FINDINGS: Vertebral body alignment, heights and disc space heights are within normal. There is moderate spondylosis of the cervical spine. Prevertebral soft tissues are normal. No significant neural foraminal narrowing. Mild uncovertebral joint spurring and mild facet arthropathy. Atlantoaxial articulation is within normal. Calcified plaque at the carotid bifurcations bilaterally. IMPRESSION: Moderate spondylosis of the cervical spine. Electronically Signed   By: Marin Olp M.D.   On: 12/20/2017 16:30   Dg Hand Complete Right  Result Date: 12/20/2017 CLINICAL DATA:  Right hand tingling which shortness-of-breath since last night. EXAM:  RIGHT HAND - COMPLETE 3+ VIEW COMPARISON:  None. FINDINGS: Mild degenerative change over the radiocarpal joint with moderate degenerative changes over the radial side of the carpal bones and severe degenerative change at the first carpometacarpal joint. Mild degenerate change over the second MCP joint. No acute fracture or dislocation. Possible mild subluxation laterally of the first metacarpal at the first carpometacarpal joint. IMPRESSION: Degenerative changes as described with severe degenerative change at the first carpometacarpal joint. Possible mild lateral subluxation of the first metacarpal at the first carpometacarpal joint. Electronically Signed   By: Marin Olp M.D.   On: 12/20/2017 16:28    Procedures Procedures (including critical care time)  Medications Ordered in ED Medications  predniSONE (DELTASONE) tablet 20 mg (has no administration in time range)     Initial Impression / Assessment and Plan / ED Course  I have reviewed the triage vital signs and the nursing notes.  Pertinent labs & imaging results that were available during my care of the patient were reviewed by me and considered in my medical decision making (see chart for details).    Labs unremarkable.  X-ray show arthritic changes.  Suspect cervical radiculopathy.  Will treat with prednisone and have patient follow-up PCP Final Clinical Impressions(s) / ED Diagnoses   Final diagnoses:  Paresthesia    ED Discharge Orders        Ordered    predniSONE (DELTASONE) 10 MG tablet  Daily     12/20/17 1741       Milton Ferguson, MD 12/20/17 1746

## 2017-12-20 NOTE — ED Notes (Signed)
Bed: FB37 Expected date:  Expected time:  Means of arrival:  Comments: 67 yo SOB, R hand numbness

## 2017-12-20 NOTE — Discharge Instructions (Addendum)
Follow-up with your family doctor in 1 week for recheck.

## 2018-08-02 ENCOUNTER — Other Ambulatory Visit: Payer: Self-pay | Admitting: Nurse Practitioner

## 2018-08-02 DIAGNOSIS — E2839 Other primary ovarian failure: Secondary | ICD-10-CM

## 2018-08-02 DIAGNOSIS — Z1231 Encounter for screening mammogram for malignant neoplasm of breast: Secondary | ICD-10-CM

## 2018-12-03 ENCOUNTER — Ambulatory Visit: Payer: Medicare Other

## 2018-12-03 ENCOUNTER — Other Ambulatory Visit: Payer: Medicare Other

## 2020-11-22 ENCOUNTER — Other Ambulatory Visit: Payer: Self-pay

## 2020-11-22 ENCOUNTER — Emergency Department (HOSPITAL_COMMUNITY)
Admission: EM | Admit: 2020-11-22 | Discharge: 2020-11-22 | Disposition: A | Discharge status: Home / Self Care | Payer: Medicare (Managed Care) | Attending: Emergency Medicine | Admitting: Emergency Medicine

## 2020-11-22 ENCOUNTER — Emergency Department (HOSPITAL_COMMUNITY): Payer: Medicare (Managed Care)

## 2020-11-22 DIAGNOSIS — R5383 Other fatigue: Secondary | ICD-10-CM | POA: Diagnosis present

## 2020-11-22 DIAGNOSIS — Z59819 Housing instability, housed unspecified: Secondary | ICD-10-CM | POA: Insufficient documentation

## 2020-11-22 DIAGNOSIS — Z599 Problem related to housing and economic circumstances, unspecified: Secondary | ICD-10-CM

## 2020-11-22 DIAGNOSIS — Z79899 Other long term (current) drug therapy: Secondary | ICD-10-CM | POA: Insufficient documentation

## 2020-11-22 DIAGNOSIS — F17210 Nicotine dependence, cigarettes, uncomplicated: Secondary | ICD-10-CM | POA: Insufficient documentation

## 2020-11-22 DIAGNOSIS — E119 Type 2 diabetes mellitus without complications: Secondary | ICD-10-CM | POA: Diagnosis not present

## 2020-11-22 DIAGNOSIS — Z96652 Presence of left artificial knee joint: Secondary | ICD-10-CM | POA: Diagnosis not present

## 2020-11-22 DIAGNOSIS — I10 Essential (primary) hypertension: Secondary | ICD-10-CM | POA: Diagnosis not present

## 2020-11-22 DIAGNOSIS — N3090 Cystitis, unspecified without hematuria: Secondary | ICD-10-CM | POA: Insufficient documentation

## 2020-11-22 LAB — CBC WITH DIFFERENTIAL/PLATELET
Abs Immature Granulocytes: 0.01 10*3/uL (ref 0.00–0.07)
Basophils Absolute: 0 10*3/uL (ref 0.0–0.1)
Basophils Relative: 0 %
Eosinophils Absolute: 0.1 10*3/uL (ref 0.0–0.5)
Eosinophils Relative: 2 %
HCT: 40.4 % (ref 36.0–46.0)
Hemoglobin: 12.6 g/dL (ref 12.0–15.0)
Immature Granulocytes: 0 %
Lymphocytes Relative: 21 %
Lymphs Abs: 1 10*3/uL (ref 0.7–4.0)
MCH: 27.3 pg (ref 26.0–34.0)
MCHC: 31.2 g/dL (ref 30.0–36.0)
MCV: 87.6 fL (ref 80.0–100.0)
Monocytes Absolute: 0.3 10*3/uL (ref 0.1–1.0)
Monocytes Relative: 7 %
Neutro Abs: 3.3 10*3/uL (ref 1.7–7.7)
Neutrophils Relative %: 70 %
Platelets: 221 10*3/uL (ref 150–400)
RBC: 4.61 MIL/uL (ref 3.87–5.11)
RDW: 13.6 % (ref 11.5–15.5)
WBC: 4.8 10*3/uL (ref 4.0–10.5)
nRBC: 0 % (ref 0.0–0.2)

## 2020-11-22 LAB — COMPREHENSIVE METABOLIC PANEL
ALT: 14 U/L (ref 0–44)
AST: 17 U/L (ref 15–41)
Albumin: 3.4 g/dL — ABNORMAL LOW (ref 3.5–5.0)
Alkaline Phosphatase: 89 U/L (ref 38–126)
Anion gap: 6 (ref 5–15)
BUN: 13 mg/dL (ref 8–23)
CO2: 27 mmol/L (ref 22–32)
Calcium: 8.8 mg/dL — ABNORMAL LOW (ref 8.9–10.3)
Chloride: 105 mmol/L (ref 98–111)
Creatinine, Ser: 1.01 mg/dL — ABNORMAL HIGH (ref 0.44–1.00)
GFR, Estimated: >60 mL/min (ref >60)
Glucose, Bld: 101 mg/dL — ABNORMAL HIGH (ref 70–99)
Potassium: 3.7 mmol/L (ref 3.5–5.1)
Sodium: 138 mmol/L (ref 135–145)
Total Bilirubin: 0.5 mg/dL (ref 0.3–1.2)
Total Protein: 7.2 g/dL (ref 6.5–8.1)

## 2020-11-22 LAB — URINALYSIS, ROUTINE W REFLEX MICROSCOPIC
Bilirubin Urine: NEGATIVE
Glucose, UA: NEGATIVE mg/dL
Ketones, ur: NEGATIVE mg/dL
Nitrite: NEGATIVE
Protein, ur: NEGATIVE mg/dL
Specific Gravity, Urine: 1.011 (ref 1.005–1.030)
WBC, UA: >50 WBC/hpf — ABNORMAL HIGH (ref 0–5)
pH: 5 (ref 5.0–8.0)

## 2020-11-22 LAB — RAPID URINE DRUG SCREEN, HOSP PERFORMED
Amphetamines: NOT DETECTED
Barbiturates: NOT DETECTED
Benzodiazepines: NOT DETECTED
Cocaine: NOT DETECTED
Opiates: NOT DETECTED
Tetrahydrocannabinol: NOT DETECTED

## 2020-11-22 MED ORDER — CEPHALEXIN 250 MG PO CAPS
500.0000 mg | ORAL_CAPSULE | Freq: Once | ORAL | Status: AC
  Administered 2020-11-22: 500 mg via ORAL
  Filled 2020-11-22: qty 2

## 2020-11-22 MED ORDER — AMLODIPINE BESYLATE 5 MG PO TABS: 5.0000 mg | ORAL_TABLET | Freq: Every day | ORAL | 0 refills | Status: DC

## 2020-11-22 MED ORDER — CEPHALEXIN 500 MG PO CAPS: 500.0000 mg | ORAL_CAPSULE | Freq: Two times a day (BID) | ORAL | 0 refills | Status: DC

## 2020-11-22 MED ORDER — AMLODIPINE BESYLATE 5 MG PO TABS
5.0000 mg | ORAL_TABLET | Freq: Once | ORAL | Status: AC
  Administered 2020-11-22: 5 mg via ORAL
  Filled 2020-11-22: qty 1

## 2020-11-22 NOTE — ED Notes (Signed)
REPORT FROM PEGGY AT THIS TIME

## 2020-11-22 NOTE — ED Notes (Signed)
Placed pt back in bed and placed her back on the monitor at this time

## 2020-11-22 NOTE — ED Provider Notes (Signed)
[facility] EMERGENCY DEPARTMENT Provider Note   CSN: [id number] Arrival date & time: 11/22/20  1552     History Chief Complaint  Patient presents with  . Depression    Tiffany [name] is a 70 y.o. female.  The history is provided by the patient and medical records.  Depression   Tiffany [name] is a 70 y.o. female who presents to the Emergency Department complaining of social stresses. She presents the emergency department via EMS complaining of increased social stress recently. She is feeling very fatigued and unhappy over her current life situations. One week ago she moved into a hotel with her daughter. She states that previously she was living with her son. She is unwilling to discuss why she is no longer living with her son. Two days ago her daughter was arrested and is now in jail. She states that she currently does not know how to care for herself. She complains of feeling very fatigued. She has a history of hypertension and reflux. She is not currently on any medications. It has been over a year since she is seen a physician. She smokes two cigarettes daily. Denies any alcohol or drug use. She has no suicidal ideation, homicidal ideation, hallucinations. She states she does not know what to do at this point. She ambulates with a walker.    Past Medical History:  Diagnosis Date  . Arthritis    osteoarthritis  . GERD (gastroesophageal reflux disease)   . Hx of bronchitis   . Hypertension     Patient Active Problem List   Diagnosis Date Noted  . Diabetes ([place]) 09/13/2015  . Generalized anxiety disorder 06/15/2014  . Chronic pain 12/28/2013  . GERD (gastroesophageal reflux disease) 09/01/2013  . Tobacco abuse 09/01/2013  . Chest pain 08/29/2013  . Hypertension   . Expected blood loss anemia 06/01/2013  . Morbid obesity ([place]) 06/01/2013  . 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 Preop cardiovascular exam 02/18/2013    Past Surgical History:  Procedure  Laterality Date  . KNEE SURGERY     arthroscopic right  . toe nail removal    . TOTAL KNEE ARTHROPLASTY Left 05/31/2013   Procedure: LEFT TOTAL KNEE ARTHROPLASTY;  Surgeon: Mauri Pole, MD;  Location: [facility];  Service: Orthopedics;  Laterality: Left;     OB History   No obstetric history on file.     Family History  Problem Relation Age of Onset  . Heart disease Father        Died of CHF age 82  . Heart disease Mother        Died of CHF age 50  . Cancer Brother        Cancer  . Heart failure Brother        Died of CHF   . High blood pressure Son   . Colon cancer Neg Hx     Social History   Tobacco Use  . Smoking status: Current Every Day Smoker    Packs/day: 0.25    Years: 20.00    Pack years: 5.00    Types: Cigarettes  . Smokeless tobacco: Never Used  . Tobacco comment: 5 cig daily   Substance Use Topics  . Alcohol use: No    Alcohol/week: 0.0 standard drinks  . Drug use: No    Home Medications Prior to Admission medications   Medication Sig Start Date End Date Taking? Authorizing Provider  amLODipine (NORVASC) 5 MG tablet Take 1 tablet (5  mg total) by mouth daily. 11/22/20  Yes Quintella Reichert, MD  cephALEXin (KEFLEX) 500 MG capsule Take 1 capsule (500 mg total) by mouth 2 (two) times daily. 11/22/20  Yes Quintella Reichert, MD  ALPRAZolam Duanne Moron) 0.5 MG tablet TAKE ONE-HALF TO ONE TABLET BY MOUTH THREE TIMES DAILY AS NEEDED FOR ANXIETY ATTACKS Patient not taking: Reported on 12/20/2017 03/06/16   Lauree Chandler, NP  baclofen (LIORESAL) 10 MG tablet TAKE ONE-HALF TABLET BY MOUTH EVERY 8 HOURS AS NEEDED FOR MUSCLE SPASM (FOR KNEE/SPASM) 04/11/16   Lauree Chandler, NP  cloNIDine (CATAPRES) 0.1 MG tablet Take 1 tablet (0.1 mg total) by mouth daily. 03/06/16   Lauree Chandler, NP  HYDROcodone-acetaminophen (NORCO) 10-325 MG tablet Take 1 tablet by mouth every 6 (six) hours as needed. Prescription to be obtained from orthopedic office. Patient not taking: Reported  on 12/20/2017 03/06/16   Lauree Chandler, NP  HYDROcodone-acetaminophen (NORCO) 10-325 MG tablet Take 1 tablet by mouth every 6 (six) hours as needed. Prescription to be obtained from orthopedic office. Patient not taking: Reported on 12/20/2017 03/06/16   Lauree Chandler, NP  lisinopril (PRINIVIL,ZESTRIL) 10 MG tablet Take 1 tablet (10 mg total) by mouth daily. Patient not taking: Reported on 12/20/2017 03/06/16   Lauree Chandler, NP  lisinopril (PRINIVIL,ZESTRIL) 40 MG tablet TK 1 T PO QD 10/21/17   [provider]  oseltamivir (TAMIFLU) 75 MG capsule Take 1 capsule (75 mg total) by mouth daily. Patient not taking: Reported on 12/20/2017 10/23/15   Lauree Chandler, NP  oxyCODONE-acetaminophen (PERCOCET) 7.5-325 MG tablet TK 1 T PO QID PRN PAIN 12/14/17   [provider]  pantoprazole (PROTONIX) 40 MG tablet Take 1 tablet (40 mg total) by mouth daily. Patient not taking: Reported on 12/20/2017 03/06/16   Lauree Chandler, NP  predniSONE (DELTASONE) 10 MG tablet Take 2 tablets (20 mg total) by mouth daily. 12/20/17   Milton Ferguson, MD    Allergies    Cymbalta [duloxetine hcl]  Review of Systems   Review of Systems  Psychiatric/Behavioral: Positive for depression.  All other systems reviewed and are negative.   Physical Exam Updated Vital Signs BP (!) 144/99   Pulse 63   Temp (!) 97.4 F (36.3 C) (Oral)   Resp 18   Ht _0  (1.575 m)   Wt 102.1 kg   SpO2 97%   BMI 41.15 kg/m   Physical Exam Vitals and nursing note reviewed.  Constitutional:      Appearance: She is well-developed.  HENT:     Head: Normocephalic and atraumatic.  Cardiovascular:     Rate and Rhythm: Normal rate and regular rhythm.  Pulmonary:     Effort: Pulmonary effort is normal. No respiratory distress.  Abdominal:     Palpations: Abdomen is soft.     Tenderness: There is no abdominal tenderness. There is no guarding or rebound.  Musculoskeletal:        General: No swelling or  tenderness.  Skin:    General: Skin is warm and dry.  Neurological:     Mental Status: She is alert and oriented to person, place, and time.  Psychiatric:        Behavior: Behavior normal.     Comments: Depressed mood and affect.     ED Results / Procedures / Treatments   Labs (all labs ordered are listed, but only abnormal results are displayed) Labs Reviewed  URINALYSIS, ROUTINE W REFLEX MICROSCOPIC - Abnormal; Notable for  the following components:      Result Value   APPearance HAZY (*)    Hgb urine dipstick SMALL (*)    Leukocytes,Ua LARGE (*)    WBC, UA >50 (*)    Bacteria, UA FEW (*)    All other components within normal limits  COMPREHENSIVE METABOLIC PANEL - Abnormal; Notable for the following components:   Glucose, Bld 101 (*)    Creatinine, Ser 1.01 (*)    Calcium 8.8 (*)    Albumin 3.4 (*)    All other components within normal limits  URINE CULTURE  CBC WITH DIFFERENTIAL/PLATELET  RAPID URINE DRUG SCREEN, HOSP PERFORMED  ETHANOL    EKG None  Radiology DG Chest 2 View  Result Date: 11/22/2020 CLINICAL DATA:  Shortness of breath and chest pain EXAM: CHEST - 2 VIEW COMPARISON:  December 20, 2017 FINDINGS: Lungs are clear. Heart is slightly enlarged with pulmonary vascularity normal. No adenopathy. There is aortic atherosclerosis. There is degenerative change in each shoulder. IMPRESSION: Mild cardiac enlargement. No edema or airspace opacity. Aortic Atherosclerosis (ICD10-I70.0). Electronically Signed   By: Lowella Grip III M.D.   On: 11/22/2020 17:20    Procedures Procedures   Medications Ordered in ED Medications  amLODipine (NORVASC) tablet 5 mg (5 mg Oral Given 11/22/20 2032)  cephALEXin (KEFLEX) capsule 500 mg (500 mg Oral Given 11/22/20 2032)    ED Course  I have reviewed the triage vital signs and the nursing notes.  Pertinent labs & imaging results that were available during my care of the patient were reviewed by me and considered in my medical  decision making (see chart for details).    MDM Rules/Calculators/A&P                         patient here for evaluation of fatigue in the setting of increased stress and sudden homelessness. She is non-toxic appearing on evaluation. UA is concerning for developing UTI, on review of systems patient does have some dysuria. Will send cultures and start antibiotics. She does not have any current suicidal ideation or evidence of acute psychosis. Case management met with the patient to discuss shelter resources. Plan to discharge with outpatient resources and return precautions  Final Clinical Impression(s) / ED Diagnoses Final diagnoses:  Essential hypertension  Cystitis  Housing instability    Rx / DC Orders ED Discharge Orders         Ordered    amLODipine (NORVASC) 5 MG tablet  Daily        11/22/20 2125    cephALEXin (KEFLEX) 500 MG capsule  2 times daily        11/22/20 2125           Quintella Reichert, MD 11/22/20 2323

## 2020-11-22 NOTE — ED Notes (Signed)
Ambulated to restroom with walker when entering the room

## 2020-11-22 NOTE — ED Notes (Signed)
Made pt aware of d/c status per pt she doesn't have anyone that she can call. When asked about calling her daughter she said she couldn't when asked about calling her son she said no. Per pt she is just going to sit outside or sit in the waiting room. Pt advised to try and thank of someone that she could call while I got her d/c paperwork together

## 2020-11-22 NOTE — ED Notes (Signed)
Social work at bedside at this time.

## 2020-11-22 NOTE — ED Notes (Signed)
Pt returned from xray

## 2020-11-22 NOTE — ED Triage Notes (Addendum)
Pt arrived to ED via EMS. Pt with c/o depression, states she has been living in a hotel room and she is lonely. Reports daughter went to jail 2 days ago and they were living together. Pt states she is tired and wants to give up. Has no support system, unable to get her medication and no access to food. Pt with no HI or SI plan. Does feel safe in the hotel. Pt with hx of same, states last episode of depression was years ago

## 2020-11-22 NOTE — Social Work (Signed)
CSW met with Pt at bedside. Pt reports that she has been staying at [employer] [facility] with daughter. Daughter was arrested 2 days ago.   Pt states that she feels lonely and sad and has felt this way for about 10 months.  Pt reports that she has not seen a PCP in over a year because she just moved back to the area.   CSW gave resources for [facility]. CSW offered to make appointment for Pt, but Pt states she has no phone for CSW to follow up, Pt agrees to call clinic herself when she gets to a phone.  Resources added to AVS.  CSW discussed discharge options with Pt.  Pt states that she has no money on her card to rent a room.  CSW repeatedly requested permission to call Pt's son who is listed in chart to request that son for assistance and Pt repeatedly denied permission.  CSW made clear fact that upon discharge, Pt would be without housing. Pt states, "I would rather sit out all night than call any of my other children.  I don't deal with them, and I won't." CSW provided shelter resource list, information about [facility], a bus pass and an umbrella.

## 2020-11-22 NOTE — ED Notes (Signed)
Social worker out of room at this time

## 2020-11-22 NOTE — ED Notes (Signed)
Patient transported to X-ray 

## 2020-11-23 ENCOUNTER — Telehealth (HOSPITAL_COMMUNITY): Payer: Self-pay | Admitting: Licensed Clinical Social Worker
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
## 2020-11-25 LAB — URINE CULTURE

## 2020-12-03 ENCOUNTER — Emergency Department (HOSPITAL_COMMUNITY)
Admission: EM | Admit: 2020-12-03 | Discharge: 2020-12-04 | Disposition: A | Discharge status: Home / Self Care | Payer: Medicare (Managed Care) | Attending: Emergency Medicine | Admitting: Emergency Medicine

## 2020-12-03 ENCOUNTER — Other Ambulatory Visit: Payer: Self-pay

## 2020-12-03 ENCOUNTER — Emergency Department (HOSPITAL_COMMUNITY): Payer: Medicare (Managed Care)

## 2020-12-03 ENCOUNTER — Encounter (HOSPITAL_COMMUNITY): Payer: Self-pay | Admitting: Emergency Medicine

## 2020-12-03 DIAGNOSIS — E119 Type 2 diabetes mellitus without complications: Secondary | ICD-10-CM | POA: Diagnosis not present

## 2020-12-03 DIAGNOSIS — M79675 Pain in left toe(s): Secondary | ICD-10-CM | POA: Insufficient documentation

## 2020-12-03 DIAGNOSIS — Z79899 Other long term (current) drug therapy: Secondary | ICD-10-CM | POA: Insufficient documentation

## 2020-12-03 DIAGNOSIS — Z96652 Presence of left artificial knee joint: Secondary | ICD-10-CM | POA: Insufficient documentation

## 2020-12-03 DIAGNOSIS — F17210 Nicotine dependence, cigarettes, uncomplicated: Secondary | ICD-10-CM | POA: Diagnosis not present

## 2020-12-03 DIAGNOSIS — I10 Essential (primary) hypertension: Secondary | ICD-10-CM | POA: Insufficient documentation

## 2020-12-03 MED ORDER — HYDROCODONE-ACETAMINOPHEN 5-325 MG PO TABS
1.0000 | ORAL_TABLET | Freq: Once | ORAL | Status: AC
  Administered 2020-12-04: 1 via ORAL
  Filled 2020-12-03: qty 1

## 2020-12-03 NOTE — ED Provider Notes (Addendum)
[facility] DEPT Provider Note   CSN: [id number] Arrival date & time: 12/03/20  2150     History Chief Complaint  Patient presents with  . Toe Pain    Tiffany [name] is a 70 y.o. female.  Patient to ED for evaluation of left 5th toe pain for the past week. She denies injury. No wounds, bleeding or nail issues/drainage. She reports she is not taking any medications currently with history of HTN, T2DM, anxiety. No established primary care.  The history is provided by the patient. No language interpreter was used.  Toe Pain       Past Medical History:  Diagnosis Date  . Arthritis    osteoarthritis  . GERD (gastroesophageal reflux disease)   . Hx of bronchitis   . Hypertension     Patient Active Problem List   Diagnosis Date Noted  . Diabetes ([place]) 09/13/2015  . Generalized anxiety disorder 06/15/2014  . Chronic pain 12/28/2013  . GERD (gastroesophageal reflux disease) 09/01/2013  . Tobacco abuse 09/01/2013  . Chest pain 08/29/2013  . Hypertension   . Expected blood loss anemia 06/01/2013  . Morbid obesity ([place]) 06/01/2013  . 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 Preop cardiovascular exam 02/18/2013    Past Surgical History:  Procedure Laterality Date  . KNEE SURGERY     arthroscopic right  . toe nail removal    . TOTAL KNEE ARTHROPLASTY Left 05/31/2013   Procedure: LEFT TOTAL KNEE ARTHROPLASTY;  Surgeon: Mauri Pole, MD;  Location: [facility];  Service: Orthopedics;  Laterality: Left;     OB History   No obstetric history on file.     Family History  Problem Relation Age of Onset  . Heart disease Father        Died of CHF age 71  . Heart disease Mother        Died of CHF age 41  . Cancer Brother        Cancer  . Heart failure Brother        Died of CHF   . High blood pressure Son   . Colon cancer Neg Hx     Social History   Tobacco Use  . Smoking status: Current Every Day Smoker    Packs/day: 0.25    Years: 20.00     Pack years: 5.00    Types: Cigarettes  . Smokeless tobacco: Never Used  . Tobacco comment: 5 cig daily   Substance Use Topics  . Alcohol use: No    Alcohol/week: 0.0 standard drinks  . Drug use: No    Home Medications Prior to Admission medications   Medication Sig Start Date End Date Taking? Authorizing Provider  ALPRAZolam (XANAX) 0.5 MG tablet TAKE ONE-HALF TO ONE TABLET BY MOUTH THREE TIMES DAILY AS NEEDED FOR ANXIETY ATTACKS Patient not taking: Reported on 12/20/2017 03/06/16   Lauree Chandler, NP  amLODipine (NORVASC) 5 MG tablet Take 1 tablet (5 mg total) by mouth daily. 11/22/20   Quintella Reichert, MD  baclofen (LIORESAL) 10 MG tablet TAKE ONE-HALF TABLET BY MOUTH EVERY 8 HOURS AS NEEDED FOR MUSCLE SPASM (FOR KNEE/SPASM) 04/11/16   Lauree Chandler, NP  cephALEXin (KEFLEX) 500 MG capsule Take 1 capsule (500 mg total) by mouth 2 (two) times daily. 11/22/20   Quintella Reichert, MD  cloNIDine (CATAPRES) 0.1 MG tablet Take 1 tablet (0.1 mg total) by mouth daily. 03/06/16   Lauree Chandler, NP  HYDROcodone-acetaminophen (NORCO) 10-325 MG tablet  Take 1 tablet by mouth every 6 (six) hours as needed. Prescription to be obtained from orthopedic office. Patient not taking: Reported on 12/20/2017 03/06/16   Lauree Chandler, NP  HYDROcodone-acetaminophen (NORCO) 10-325 MG tablet Take 1 tablet by mouth every 6 (six) hours as needed. Prescription to be obtained from orthopedic office. Patient not taking: Reported on 12/20/2017 03/06/16   Lauree Chandler, NP  lisinopril (PRINIVIL,ZESTRIL) 10 MG tablet Take 1 tablet (10 mg total) by mouth daily. Patient not taking: Reported on 12/20/2017 03/06/16   Lauree Chandler, NP  lisinopril (PRINIVIL,ZESTRIL) 40 MG tablet TK 1 T PO QD 10/21/17   [provider]  oseltamivir (TAMIFLU) 75 MG capsule Take 1 capsule (75 mg total) by mouth daily. Patient not taking: Reported on 12/20/2017 10/23/15   Lauree Chandler, NP  oxyCODONE-acetaminophen  (PERCOCET) 7.5-325 MG tablet TK 1 T PO QID PRN PAIN 12/14/17   [provider]  pantoprazole (PROTONIX) 40 MG tablet Take 1 tablet (40 mg total) by mouth daily. Patient not taking: Reported on 12/20/2017 03/06/16   Lauree Chandler, NP  predniSONE (DELTASONE) 10 MG tablet Take 2 tablets (20 mg total) by mouth daily. 12/20/17   Milton Ferguson, MD    Allergies    Cymbalta [duloxetine hcl]  Review of Systems   Review of Systems  Constitutional: Negative for fever.  Musculoskeletal:       See HPI.  Skin: Positive for color change.  Neurological: Negative for weakness and numbness.    Physical Exam Updated Vital Signs BP (!) 183/130 (BP Location: Left Arm)   Pulse 96   Temp 98.1 F (36.7 C) (Oral)   Resp 18   Ht 5\' 2"  (1.575 m)   Wt 102.1 kg   SpO2 99%   BMI 41.15 kg/m   Physical Exam Constitutional:      Appearance: She is well-developed.  Pulmonary:     Effort: Pulmonary effort is normal.  Musculoskeletal:     Cervical back: Normal range of motion.     Comments: Left 5th toe: nail intact, diffuse discoloration that appears ecchymotic. No skin breakdown or lesion. Warm to touch. No significant tenderness on palpation.   Skin:    General: Skin is warm and dry.  Neurological:     Mental Status: She is alert and oriented to person, place, and time.     ED Results / Procedures / Treatments   Labs (all labs ordered are listed, but only abnormal results are displayed) Labs Reviewed - No data to display  EKG [name]  Radiology No results found.  Procedures Procedures   Medications Ordered in ED Medications - No data to display  ED Course  I have reviewed the triage vital signs and the nursing notes.  Pertinent labs & imaging results that were available during my care of the patient were reviewed by me and considered in my medical decision making (see chart for details).    MDM Rules/Calculators/A&P                          Patient presents to ED for  evaluation of left 5th toe pain. No injury.   The toe is diffusely discolored. Appears ecchymotic, dark, but not gangrenous. Imaging and labs are negative. Suspect minor injury. She has been evaluated by Dr. Florina Ou who agrees with plan.  She can be discharged home and PCP follow up is recommended for this, and for recheck/management of blood pressure.  Final Clinical Impression(s) / ED Diagnoses Final diagnoses:  [name]   1. Left 5th toe pain  Rx / DC Orders ED Discharge Orders    [name]       Charlann Lange, PA-C 12/04/20 0204    Charlann Lange, PA-C 12/04/20 0208    Molpus, Jenny Reichmann, MD 12/04/20 [phone]

## 2020-12-04 DIAGNOSIS — M79675 Pain in left toe(s): Secondary | ICD-10-CM | POA: Diagnosis not present

## 2020-12-04 LAB — COMPREHENSIVE METABOLIC PANEL
ALT: 14 U/L (ref 0–44)
AST: 14 U/L — ABNORMAL LOW (ref 15–41)
Albumin: 3.4 g/dL — ABNORMAL LOW (ref 3.5–5.0)
Alkaline Phosphatase: 81 U/L (ref 38–126)
Anion gap: 8 (ref 5–15)
BUN: 17 mg/dL (ref 8–23)
CO2: 25 mmol/L (ref 22–32)
Calcium: 8.5 mg/dL — ABNORMAL LOW (ref 8.9–10.3)
Chloride: 105 mmol/L (ref 98–111)
Creatinine, Ser: 0.88 mg/dL (ref 0.44–1.00)
GFR, Estimated: >60 mL/min (ref >60)
Glucose, Bld: 109 mg/dL — ABNORMAL HIGH (ref 70–99)
Potassium: 3.8 mmol/L (ref 3.5–5.1)
Sodium: 138 mmol/L (ref 135–145)
Total Bilirubin: 0.6 mg/dL (ref 0.3–1.2)
Total Protein: 7.5 g/dL (ref 6.5–8.1)

## 2020-12-04 LAB — CBC WITH DIFFERENTIAL/PLATELET
Abs Immature Granulocytes: 0.05 10*3/uL (ref 0.00–0.07)
Basophils Absolute: 0 10*3/uL (ref 0.0–0.1)
Basophils Relative: 0 %
Eosinophils Absolute: 0.1 10*3/uL (ref 0.0–0.5)
Eosinophils Relative: 2 %
HCT: 38.3 % (ref 36.0–46.0)
Hemoglobin: 11.7 g/dL — ABNORMAL LOW (ref 12.0–15.0)
Immature Granulocytes: 1 %
Lymphocytes Relative: 19 %
Lymphs Abs: 1.4 10*3/uL (ref 0.7–4.0)
MCH: 27.2 pg (ref 26.0–34.0)
MCHC: 30.5 g/dL (ref 30.0–36.0)
MCV: 89.1 fL (ref 80.0–100.0)
Monocytes Absolute: 0.4 10*3/uL (ref 0.1–1.0)
Monocytes Relative: 6 %
Neutro Abs: 5 10*3/uL (ref 1.7–7.7)
Neutrophils Relative %: 72 %
Platelets: 245 10*3/uL (ref 150–400)
RBC: 4.3 MIL/uL (ref 3.87–5.11)
RDW: 13.7 % (ref 11.5–15.5)
WBC: 7 10*3/uL (ref 4.0–10.5)
nRBC: 0 % (ref 0.0–0.2)

## 2020-12-04 MED ORDER — IBUPROFEN 600 MG PO TABS: 600.0000 mg | ORAL_TABLET | Freq: Four times a day (QID) | ORAL | 0 refills | Status: DC | PRN

## 2020-12-04 NOTE — ED Notes (Signed)
Pt asleep.

## 2020-12-04 NOTE — Discharge Instructions (Addendum)
Your lab and xray studies of the toe are normal. Treat discomfort with ibuprofen as prescribed.   Please schedule an appointment with your doctor for recheck in 2 days. If the toe becomes exceedingly painful, the color changes, please return to the emergency department for further evaluation and recheck.

## 2020-12-04 NOTE — ED Notes (Signed)
Pt to X-Ray via stretcher 

## 2021-02-05 ENCOUNTER — Inpatient Hospital Stay (HOSPITAL_COMMUNITY)
Admission: EM | Admit: 2021-02-05 | Discharge: 2021-02-19 | DRG: 617 | Disposition: A | Discharge status: Skilled Nursing Facility | Payer: Medicare (Managed Care) | Attending: Student in an Organized Health Care Education/Training Program | Admitting: Student in an Organized Health Care Education/Training Program

## 2021-02-05 ENCOUNTER — Emergency Department (HOSPITAL_COMMUNITY): Payer: Medicare (Managed Care)

## 2021-02-05 ENCOUNTER — Encounter (HOSPITAL_COMMUNITY): Payer: Self-pay

## 2021-02-05 DIAGNOSIS — Z01810 Encounter for preprocedural cardiovascular examination: Secondary | ICD-10-CM

## 2021-02-05 DIAGNOSIS — F17210 Nicotine dependence, cigarettes, uncomplicated: Secondary | ICD-10-CM | POA: Diagnosis present

## 2021-02-05 DIAGNOSIS — L97529 Non-pressure chronic ulcer of other part of left foot with unspecified severity: Secondary | ICD-10-CM | POA: Diagnosis present

## 2021-02-05 DIAGNOSIS — I70263 Atherosclerosis of native arteries of extremities with gangrene, bilateral legs: Secondary | ICD-10-CM | POA: Diagnosis present

## 2021-02-05 DIAGNOSIS — Z20822 Contact with and (suspected) exposure to covid-19: Secondary | ICD-10-CM | POA: Diagnosis present

## 2021-02-05 DIAGNOSIS — E11621 Type 2 diabetes mellitus with foot ulcer: Secondary | ICD-10-CM | POA: Diagnosis present

## 2021-02-05 DIAGNOSIS — F419 Anxiety disorder, unspecified: Secondary | ICD-10-CM | POA: Diagnosis present

## 2021-02-05 DIAGNOSIS — Z833 Family history of diabetes mellitus: Secondary | ICD-10-CM

## 2021-02-05 DIAGNOSIS — G4733 Obstructive sleep apnea (adult) (pediatric): Secondary | ICD-10-CM | POA: Diagnosis present

## 2021-02-05 DIAGNOSIS — Z7982 Long term (current) use of aspirin: Secondary | ICD-10-CM

## 2021-02-05 DIAGNOSIS — I96 Gangrene, not elsewhere classified: Secondary | ICD-10-CM | POA: Diagnosis present

## 2021-02-05 DIAGNOSIS — E1151 Type 2 diabetes mellitus with diabetic peripheral angiopathy without gangrene: Secondary | ICD-10-CM | POA: Diagnosis present

## 2021-02-05 DIAGNOSIS — E669 Obesity, unspecified: Secondary | ICD-10-CM | POA: Diagnosis present

## 2021-02-05 DIAGNOSIS — Z809 Family history of malignant neoplasm, unspecified: Secondary | ICD-10-CM

## 2021-02-05 DIAGNOSIS — Z8249 Family history of ischemic heart disease and other diseases of the circulatory system: Secondary | ICD-10-CM

## 2021-02-05 DIAGNOSIS — Z720 Tobacco use: Secondary | ICD-10-CM | POA: Diagnosis present

## 2021-02-05 DIAGNOSIS — Z95828 Presence of other vascular implants and grafts: Secondary | ICD-10-CM

## 2021-02-05 DIAGNOSIS — E1165 Type 2 diabetes mellitus with hyperglycemia: Secondary | ICD-10-CM | POA: Diagnosis present

## 2021-02-05 DIAGNOSIS — M869 Osteomyelitis, unspecified: Secondary | ICD-10-CM

## 2021-02-05 DIAGNOSIS — Z888 Allergy status to other drugs, medicaments and biological substances status: Secondary | ICD-10-CM

## 2021-02-05 DIAGNOSIS — Z96652 Presence of left artificial knee joint: Secondary | ICD-10-CM | POA: Diagnosis present

## 2021-02-05 DIAGNOSIS — L03116 Cellulitis of left lower limb: Secondary | ICD-10-CM

## 2021-02-05 DIAGNOSIS — F32A Depression, unspecified: Secondary | ICD-10-CM | POA: Diagnosis present

## 2021-02-05 DIAGNOSIS — M79675 Pain in left toe(s): Secondary | ICD-10-CM | POA: Diagnosis not present

## 2021-02-05 DIAGNOSIS — K219 Gastro-esophageal reflux disease without esophagitis: Secondary | ICD-10-CM | POA: Diagnosis present

## 2021-02-05 DIAGNOSIS — R739 Hyperglycemia, unspecified: Secondary | ICD-10-CM

## 2021-02-05 DIAGNOSIS — E1169 Type 2 diabetes mellitus with other specified complication: Principal | ICD-10-CM | POA: Diagnosis present

## 2021-02-05 DIAGNOSIS — S98132A Complete traumatic amputation of one left lesser toe, initial encounter: Secondary | ICD-10-CM

## 2021-02-05 DIAGNOSIS — I10 Essential (primary) hypertension: Secondary | ICD-10-CM

## 2021-02-05 DIAGNOSIS — E1152 Type 2 diabetes mellitus with diabetic peripheral angiopathy with gangrene: Secondary | ICD-10-CM | POA: Diagnosis present

## 2021-02-05 DIAGNOSIS — Z79899 Other long term (current) drug therapy: Secondary | ICD-10-CM

## 2021-02-05 LAB — CBC WITH DIFFERENTIAL/PLATELET
Abs Immature Granulocytes: 0.01 10*3/uL (ref 0.00–0.07)
Basophils Absolute: 0 10*3/uL (ref 0.0–0.1)
Basophils Relative: 0 %
Eosinophils Absolute: 0.1 10*3/uL (ref 0.0–0.5)
Eosinophils Relative: 2 %
HCT: 41.2 % (ref 36.0–46.0)
Hemoglobin: 12.7 g/dL (ref 12.0–15.0)
Immature Granulocytes: 0 %
Lymphocytes Relative: 19 %
Lymphs Abs: 1.2 10*3/uL (ref 0.7–4.0)
MCH: 26.8 pg (ref 26.0–34.0)
MCHC: 30.8 g/dL (ref 30.0–36.0)
MCV: 86.9 fL (ref 80.0–100.0)
Monocytes Absolute: 0.4 10*3/uL (ref 0.1–1.0)
Monocytes Relative: 5 %
Neutro Abs: 4.9 10*3/uL (ref 1.7–7.7)
Neutrophils Relative %: 74 %
Platelets: 236 10*3/uL (ref 150–400)
RBC: 4.74 MIL/uL (ref 3.87–5.11)
RDW: 14.4 % (ref 11.5–15.5)
WBC: 6.6 10*3/uL (ref 4.0–10.5)
nRBC: 0 % (ref 0.0–0.2)

## 2021-02-05 LAB — COMPREHENSIVE METABOLIC PANEL
ALT: 18 U/L (ref 0–44)
AST: 17 U/L (ref 15–41)
Albumin: 3.6 g/dL (ref 3.5–5.0)
Alkaline Phosphatase: 94 U/L (ref 38–126)
Anion gap: 10 (ref 5–15)
BUN: 7 mg/dL — ABNORMAL LOW (ref 8–23)
CO2: 28 mmol/L (ref 22–32)
Calcium: 9 mg/dL (ref 8.9–10.3)
Chloride: 103 mmol/L (ref 98–111)
Creatinine, Ser: 0.85 mg/dL (ref 0.44–1.00)
GFR, Estimated: >60 mL/min (ref >60)
Glucose, Bld: 118 mg/dL — ABNORMAL HIGH (ref 70–99)
Potassium: 3.2 mmol/L — ABNORMAL LOW (ref 3.5–5.1)
Sodium: 141 mmol/L (ref 135–145)
Total Bilirubin: 0.6 mg/dL (ref 0.3–1.2)
Total Protein: 7.6 g/dL (ref 6.5–8.1)

## 2021-02-05 NOTE — ED Provider Notes (Signed)
Emergency Medicine Provider Triage Evaluation Note  Tiffany [name] , a 70 y.o. female  was evaluated in triage.  Pt complains of black left 5th toe/ Pt reports toe has been black for several weeks.   Review of Systems  Positive: Elevated glucose Negative: No fever  Physical Exam  BP (!) 190/115   Pulse 86   Temp 98.1 F (36.7 C)   SpO2 100%  Gen:   Awake, no distress   Resp:  Normal effort  MSK:   Moves extremities. Black left 5th toe  Other:    Medical Decision Making  Medically screening exam initiated at 9:24 PM.  Appropriate orders placed.  Tiffany [name] was informed that the remainder of the evaluation will be completed by another provider, this initial triage assessment does not replace that evaluation, and the importance of remaining in the ED until their evaluation is complete.     Sidney Ace 02/05/21 2126    Blanchie Dessert, MD 02/08/21 1352

## 2021-02-05 NOTE — ED Triage Notes (Signed)
Pt comes via [facility] for R little toe pain that has been going on for the past month, now black

## 2021-02-06 DIAGNOSIS — Z79899 Other long term (current) drug therapy: Secondary | ICD-10-CM | POA: Diagnosis not present

## 2021-02-06 DIAGNOSIS — Z8249 Family history of ischemic heart disease and other diseases of the circulatory system: Secondary | ICD-10-CM | POA: Diagnosis not present

## 2021-02-06 DIAGNOSIS — I70263 Atherosclerosis of native arteries of extremities with gangrene, bilateral legs: Secondary | ICD-10-CM | POA: Diagnosis present

## 2021-02-06 DIAGNOSIS — E11621 Type 2 diabetes mellitus with foot ulcer: Secondary | ICD-10-CM | POA: Diagnosis present

## 2021-02-06 DIAGNOSIS — G4733 Obstructive sleep apnea (adult) (pediatric): Secondary | ICD-10-CM | POA: Diagnosis present

## 2021-02-06 DIAGNOSIS — Z20822 Contact with and (suspected) exposure to covid-19: Secondary | ICD-10-CM | POA: Diagnosis present

## 2021-02-06 DIAGNOSIS — M79675 Pain in left toe(s): Secondary | ICD-10-CM | POA: Diagnosis present

## 2021-02-06 DIAGNOSIS — Z809 Family history of malignant neoplasm, unspecified: Secondary | ICD-10-CM | POA: Diagnosis not present

## 2021-02-06 DIAGNOSIS — F17210 Nicotine dependence, cigarettes, uncomplicated: Secondary | ICD-10-CM | POA: Diagnosis present

## 2021-02-06 DIAGNOSIS — Z888 Allergy status to other drugs, medicaments and biological substances status: Secondary | ICD-10-CM | POA: Diagnosis not present

## 2021-02-06 DIAGNOSIS — E1151 Type 2 diabetes mellitus with diabetic peripheral angiopathy without gangrene: Secondary | ICD-10-CM

## 2021-02-06 DIAGNOSIS — E119 Type 2 diabetes mellitus without complications: Secondary | ICD-10-CM | POA: Diagnosis not present

## 2021-02-06 DIAGNOSIS — I96 Gangrene, not elsewhere classified: Secondary | ICD-10-CM | POA: Diagnosis not present

## 2021-02-06 DIAGNOSIS — F419 Anxiety disorder, unspecified: Secondary | ICD-10-CM | POA: Diagnosis present

## 2021-02-06 DIAGNOSIS — K219 Gastro-esophageal reflux disease without esophagitis: Secondary | ICD-10-CM | POA: Diagnosis present

## 2021-02-06 DIAGNOSIS — I739 Peripheral vascular disease, unspecified: Secondary | ICD-10-CM | POA: Diagnosis not present

## 2021-02-06 DIAGNOSIS — E1152 Type 2 diabetes mellitus with diabetic peripheral angiopathy with gangrene: Secondary | ICD-10-CM | POA: Diagnosis present

## 2021-02-06 DIAGNOSIS — M868X7 Other osteomyelitis, ankle and foot: Secondary | ICD-10-CM | POA: Diagnosis not present

## 2021-02-06 DIAGNOSIS — Z01810 Encounter for preprocedural cardiovascular examination: Secondary | ICD-10-CM | POA: Diagnosis not present

## 2021-02-06 DIAGNOSIS — E1169 Type 2 diabetes mellitus with other specified complication: Secondary | ICD-10-CM | POA: Diagnosis present

## 2021-02-06 DIAGNOSIS — M869 Osteomyelitis, unspecified: Secondary | ICD-10-CM | POA: Diagnosis present

## 2021-02-06 DIAGNOSIS — L97529 Non-pressure chronic ulcer of other part of left foot with unspecified severity: Secondary | ICD-10-CM | POA: Diagnosis present

## 2021-02-06 DIAGNOSIS — I70262 Atherosclerosis of native arteries of extremities with gangrene, left leg: Secondary | ICD-10-CM | POA: Diagnosis not present

## 2021-02-06 DIAGNOSIS — Z720 Tobacco use: Secondary | ICD-10-CM | POA: Diagnosis not present

## 2021-02-06 DIAGNOSIS — E669 Obesity, unspecified: Secondary | ICD-10-CM | POA: Diagnosis present

## 2021-02-06 DIAGNOSIS — E1165 Type 2 diabetes mellitus with hyperglycemia: Secondary | ICD-10-CM | POA: Diagnosis present

## 2021-02-06 DIAGNOSIS — Z7982 Long term (current) use of aspirin: Secondary | ICD-10-CM | POA: Diagnosis not present

## 2021-02-06 DIAGNOSIS — Z833 Family history of diabetes mellitus: Secondary | ICD-10-CM | POA: Diagnosis not present

## 2021-02-06 DIAGNOSIS — Z96652 Presence of left artificial knee joint: Secondary | ICD-10-CM | POA: Diagnosis present

## 2021-02-06 DIAGNOSIS — I10 Essential (primary) hypertension: Secondary | ICD-10-CM | POA: Diagnosis present

## 2021-02-06 DIAGNOSIS — F32A Depression, unspecified: Secondary | ICD-10-CM | POA: Diagnosis present

## 2021-02-06 DIAGNOSIS — I7092 Chronic total occlusion of artery of the extremities: Secondary | ICD-10-CM | POA: Diagnosis not present

## 2021-02-06 DIAGNOSIS — L03116 Cellulitis of left lower limb: Secondary | ICD-10-CM | POA: Diagnosis present

## 2021-02-06 LAB — CBG MONITORING, ED
Glucose-Capillary: 95 mg/dL (ref 70–99)
Glucose-Capillary: 105 mg/dL — ABNORMAL HIGH (ref 70–99)
Glucose-Capillary: 96 mg/dL (ref 70–99)

## 2021-02-06 LAB — LACTIC ACID, PLASMA: Lactic Acid, Venous: 1.3 mmol/L (ref 0.5–1.9)

## 2021-02-06 LAB — HIV ANTIBODY (ROUTINE TESTING W REFLEX): HIV Screen 4th Generation wRfx: NONREACTIVE

## 2021-02-06 LAB — RESP PANEL BY RT-PCR (FLU A&B, COVID) ARPGX2
Influenza A by PCR: NEGATIVE
Influenza B by PCR: NEGATIVE
SARS Coronavirus 2 by RT PCR: NEGATIVE

## 2021-02-06 LAB — GLUCOSE, CAPILLARY: Glucose-Capillary: 103 mg/dL — ABNORMAL HIGH (ref 70–99)

## 2021-02-06 LAB — PROTIME-INR
INR: 1 (ref 0.8–1.2)
Prothrombin Time: 13.5 seconds (ref 11.4–15.2)

## 2021-02-06 MED ORDER — AMLODIPINE BESYLATE 5 MG PO TABS
5.0000 mg | ORAL_TABLET | Freq: Every day | ORAL | Status: DC
  Administered 2021-02-06 – 2021-02-09 (×4): 5 mg via ORAL
  Filled 2021-02-06 (×4): qty 1

## 2021-02-06 MED ORDER — ACETAMINOPHEN 650 MG RE SUPP: 650.0000 mg | Freq: Four times a day (QID) | RECTAL | Status: DC | PRN

## 2021-02-06 MED ORDER — ACETAMINOPHEN 325 MG PO TABS: 650.0000 mg | ORAL_TABLET | Freq: Four times a day (QID) | ORAL | Status: DC | PRN

## 2021-02-06 MED ORDER — POTASSIUM CHLORIDE CRYS ER 20 MEQ PO TBCR
40.0000 meq | EXTENDED_RELEASE_TABLET | Freq: Once | ORAL | Status: AC
  Administered 2021-02-06: 40 meq via ORAL
  Filled 2021-02-06: qty 2

## 2021-02-06 MED ORDER — ONDANSETRON HCL 4 MG/2ML IJ SOLN
4.0000 mg | Freq: Once | INTRAMUSCULAR | Status: AC
  Administered 2021-02-06: 4 mg via INTRAVENOUS
  Filled 2021-02-06: qty 2

## 2021-02-06 MED ORDER — VANCOMYCIN HCL 1000 MG/200ML IV SOLN
1000.0000 mg | Freq: Two times a day (BID) | INTRAVENOUS | Status: DC
  Administered 2021-02-06 – 2021-02-07 (×2): 1000 mg via INTRAVENOUS
  Filled 2021-02-06 (×2): qty 200

## 2021-02-06 MED ORDER — SODIUM CHLORIDE 0.9 % IV SOLN: 2.0000 g | INTRAVENOUS | Status: DC

## 2021-02-06 MED ORDER — OXYCODONE HCL 5 MG PO TABS
5.0000 mg | ORAL_TABLET | ORAL | Status: DC | PRN
  Administered 2021-02-06: 5 mg via ORAL
  Filled 2021-02-06: qty 1

## 2021-02-06 MED ORDER — SODIUM CHLORIDE 0.9 % IV BOLUS
500.0000 mL | Freq: Once | INTRAVENOUS | Status: AC
  Administered 2021-02-06: 500 mL via INTRAVENOUS

## 2021-02-06 MED ORDER — METRONIDAZOLE 500 MG/100ML IV SOLN
500.0000 mg | Freq: Three times a day (TID) | INTRAVENOUS | Status: DC
  Administered 2021-02-06 – 2021-02-07 (×3): 500 mg via INTRAVENOUS
  Filled 2021-02-06 (×4): qty 100

## 2021-02-06 MED ORDER — SODIUM CHLORIDE 0.9 % IV SOLN
2.0000 g | Freq: Three times a day (TID) | INTRAVENOUS | Status: DC
  Administered 2021-02-06: 2 g via INTRAVENOUS
  Filled 2021-02-06: qty 2

## 2021-02-06 MED ORDER — ONDANSETRON HCL 4 MG PO TABS: 4.0000 mg | ORAL_TABLET | Freq: Four times a day (QID) | ORAL | Status: DC | PRN

## 2021-02-06 MED ORDER — OXYCODONE HCL 5 MG PO TABS: 5.0000 mg | ORAL_TABLET | ORAL | Status: DC | PRN

## 2021-02-06 MED ORDER — HEPARIN SODIUM (PORCINE) 5000 UNIT/ML IJ SOLN
5000.0000 [IU] | Freq: Three times a day (TID) | INTRAMUSCULAR | Status: DC
  Administered 2021-02-06 – 2021-02-12 (×16): 5000 [IU] via SUBCUTANEOUS
  Filled 2021-02-06 (×19): qty 1

## 2021-02-06 MED ORDER — VANCOMYCIN HCL 1750 MG/350ML IV SOLN
1750.0000 mg | Freq: Once | INTRAVENOUS | Status: AC
  Administered 2021-02-06: 1750 mg via INTRAVENOUS
  Filled 2021-02-06: qty 350

## 2021-02-06 MED ORDER — FENTANYL CITRATE (PF) 100 MCG/2ML IJ SOLN
25.0000 ug | Freq: Once | INTRAMUSCULAR | Status: AC
  Administered 2021-02-06: 25 ug via INTRAVENOUS
  Filled 2021-02-06: qty 2

## 2021-02-06 MED ORDER — ONDANSETRON HCL 4 MG/2ML IJ SOLN
4.0000 mg | Freq: Four times a day (QID) | INTRAMUSCULAR | Status: DC | PRN
  Administered 2021-02-07: 4 mg via INTRAVENOUS
  Filled 2021-02-06: qty 2

## 2021-02-06 MED ORDER — INSULIN ASPART 100 UNIT/ML IJ SOLN
0.0000 [IU] | INTRAMUSCULAR | Status: DC
Start: 2021-02-06 — End: 2021-02-09
  Administered 2021-02-07: 3 [IU] via SUBCUTANEOUS
  Administered 2021-02-08 – 2021-02-09 (×3): 2 [IU] via SUBCUTANEOUS

## 2021-02-06 NOTE — ED Notes (Signed)
Pt requesting a tuna fish sandwich, explained to pt again that we do not have a stocked kitchen in the emergency department and do not have access to a tuna fish sandwich at this time.

## 2021-02-06 NOTE — ED Provider Notes (Addendum)
[name] EMERGENCY DEPARTMENT Provider Note   CSN: [id number] Arrival date & time: 02/05/21  2121     History Chief Complaint  Patient presents with   Toe Pain    Tiffany [name] is a 70 y.o. female with past medical history significant for diabetes, anxiety, tobacco abuse, hypertension who presents for evaluation of left little toe pain.  Has been hurting over the last month however worse over the last week.  For the last week she has noted that her little toe turned black.  She has noted that she some redness extending the anterior left leg. Has not followed with PCP in years. No known arterial disease that patient is aware of. Has had chills however has not taken temp. Feels generally weak. No fever, chills, HA, emesis, CP, SOB Abd pain, unilateral weakness, numbness. No falls or injuries. Rates her pain a 8/10. Denies additional aggravating or alleviating factors.  No home meds.   History obtained from patient and past medical records. No interpretor was used.  PCP- None   HPI     Past Medical History:  Diagnosis Date   Arthritis    osteoarthritis   GERD (gastroesophageal reflux disease)    Hx of bronchitis    Hypertension     Patient Active Problem List   Diagnosis Date Noted   Diabetes ([place]) 09/13/2015   Generalized anxiety disorder 06/15/2014   Chronic pain 12/28/2013   GERD (gastroesophageal reflux disease) 09/01/2013   Tobacco abuse 09/01/2013   Chest pain 08/29/2013   Hypertension    Expected blood loss anemia 06/01/2013   Morbid obesity ([place]) 06/01/2013   S/P left TKA 05/31/2013   Preop cardiovascular exam 02/18/2013    Past Surgical History:  Procedure Laterality Date   KNEE SURGERY     arthroscopic right   toe nail removal     TOTAL KNEE ARTHROPLASTY Left 05/31/2013   Procedure: LEFT TOTAL KNEE ARTHROPLASTY;  Surgeon: Mauri Pole, MD;  Location: [facility];  Service: Orthopedics;  Laterality: Left;     OB History   No  obstetric history on file.     Family History  Problem Relation Age of Onset   Heart disease Father        Died of CHF age 55   Heart disease Mother        Died of CHF age 29   Cancer Brother        Cancer   Heart failure Brother        Died of CHF    High blood pressure Son    Colon cancer Neg Hx     Social History   Tobacco Use   Smoking status: Every Day    Packs/day: 0.25    Years: 20.00    Pack years: 5.00    Types: Cigarettes   Smokeless tobacco: Never   Tobacco comments:    5 cig daily   Substance Use Topics   Alcohol use: No    Alcohol/week: 0.0 standard drinks   Drug use: No    Home Medications Prior to Admission medications   Medication Sig Start Date End Date Taking? Authorizing Provider  ALPRAZolam (XANAX) 0.5 MG tablet TAKE ONE-HALF TO ONE TABLET BY MOUTH THREE TIMES DAILY AS NEEDED FOR ANXIETY ATTACKS Patient not taking: Reported on 12/20/2017 03/06/16   Lauree Chandler, NP  amLODipine (NORVASC) 5 MG tablet Take 1 tablet (5 mg total) by mouth daily. 11/22/20   Quintella Reichert, MD  baclofen (LIORESAL) 10 MG tablet TAKE ONE-HALF TABLET BY MOUTH EVERY 8 HOURS AS NEEDED FOR MUSCLE SPASM (FOR KNEE/SPASM) 04/11/16   Lauree Chandler, NP  cephALEXin (KEFLEX) 500 MG capsule Take 1 capsule (500 mg total) by mouth 2 (two) times daily. 11/22/20   Quintella Reichert, MD  cloNIDine (CATAPRES) 0.1 MG tablet Take 1 tablet (0.1 mg total) by mouth daily. 03/06/16   Lauree Chandler, NP  HYDROcodone-acetaminophen (NORCO) 10-325 MG tablet Take 1 tablet by mouth every 6 (six) hours as needed. Prescription to be obtained from orthopedic office. Patient not taking: Reported on 12/20/2017 03/06/16   Lauree Chandler, NP  HYDROcodone-acetaminophen (NORCO) 10-325 MG tablet Take 1 tablet by mouth every 6 (six) hours as needed. Prescription to be obtained from orthopedic office. Patient not taking: Reported on 12/20/2017 03/06/16   Lauree Chandler, NP  ibuprofen (ADVIL) 600 MG  tablet Take 1 tablet (600 mg total) by mouth every 6 (six) hours as needed. 12/04/20   Charlann Lange, PA-C  lisinopril (PRINIVIL,ZESTRIL) 10 MG tablet Take 1 tablet (10 mg total) by mouth daily. Patient not taking: Reported on 12/20/2017 03/06/16   Lauree Chandler, NP  lisinopril (PRINIVIL,ZESTRIL) 40 MG tablet TK 1 T PO QD 10/21/17   [provider]  oseltamivir (TAMIFLU) 75 MG capsule Take 1 capsule (75 mg total) by mouth daily. Patient not taking: Reported on 12/20/2017 10/23/15   Lauree Chandler, NP  oxyCODONE-acetaminophen (PERCOCET) 7.5-325 MG tablet TK 1 T PO QID PRN PAIN 12/14/17   [provider]  pantoprazole (PROTONIX) 40 MG tablet Take 1 tablet (40 mg total) by mouth daily. Patient not taking: Reported on 12/20/2017 03/06/16   Lauree Chandler, NP  predniSONE (DELTASONE) 10 MG tablet Take 2 tablets (20 mg total) by mouth daily. 12/20/17   Milton Ferguson, MD    Allergies    Cymbalta [duloxetine hcl]  Review of Systems   Review of Systems  Constitutional:  Positive for chills. Negative for activity change, appetite change, diaphoresis, fatigue, fever and unexpected weight change.  HENT: Negative.    Respiratory: Negative.    Cardiovascular: Negative.   Gastrointestinal: Negative.   Genitourinary: Negative.   Musculoskeletal: Negative.   Skin:  Positive for color change and wound.  Neurological:  Positive for weakness (Generalized weakness). Negative for dizziness, tremors, seizures, syncope, facial asymmetry, speech difficulty, light-headedness, numbness and headaches.  All other systems reviewed and are negative.  Physical Exam Updated Vital Signs BP (!) 177/96   Pulse 70   Temp 98.2 F (36.8 C)   Resp 14   Ht 5\' 2"  (1.575 m)   Wt 91 kg   SpO2 100%   BMI 36.69 kg/m   Physical Exam Vitals and nursing note reviewed.  Constitutional:      General: She is not in acute distress.    Appearance: She is well-developed. She is not ill-appearing,  toxic-appearing or diaphoretic.  HENT:     Head: Normocephalic and atraumatic.     Nose: Nose normal.     Mouth/Throat:     Mouth: Mucous membranes are moist.  Eyes:     Pupils: Pupils are equal, round, and reactive to light.  Cardiovascular:     Rate and Rhythm: Normal rate.     Pulses:          Radial pulses are 2+ on the right side and 2+ on the left side.       Dorsalis pedis pulses are detected w/ Doppler on  the right side and detected w/ Doppler on the left side.     Heart sounds: Normal heart sounds.  Pulmonary:     Effort: Pulmonary effort is normal. No respiratory distress.     Breath sounds: Normal breath sounds and air entry.  Abdominal:     General: Bowel sounds are normal. There is no distension.     Palpations: Abdomen is soft.     Tenderness: There is no abdominal tenderness. There is no guarding or rebound.  Musculoskeletal:        General: Normal range of motion.     Cervical back: Normal range of motion.     Comments: Necrotic left little toe. No tib/fib  bony tenderness. Full ROM BL extremities  Feet:     Left foot:     Skin integrity: Erythema and warmth present.     Comments: Necrotic left little toe. Skin:    General: Skin is warm and dry.     Comments: See picture in chart. Erythema and warmth to left LE from dorsal foot to mid shin.  Neurological:     General: No focal deficit present.     Mental Status: She is alert.     Cranial Nerves: Cranial nerves are intact.     Motor: Motor function is intact.     Comments: Intact sensation to LE feet Ambulatory  Psychiatric:        Mood and Affect: Mood normal.          ED Results / Procedures / Treatments   Labs (all labs ordered are listed, but only abnormal results are displayed) Labs Reviewed  COMPREHENSIVE METABOLIC PANEL - Abnormal; Notable for the following components:      Result Value   Potassium 3.2 (*)    Glucose, Bld 118 (*)    BUN 7 (*)    All other components within normal limits   RESP PANEL BY RT-PCR (FLU A&B, COVID) ARPGX2  CULTURE, BLOOD (ROUTINE X 2)  CULTURE, BLOOD (ROUTINE X 2)  CBC WITH DIFFERENTIAL/PLATELET  LACTIC ACID, PLASMA  LACTIC ACID, PLASMA  CBG MONITORING, ED    EKG EKG Interpretation  Date/Time:  Wednesday February 06 2021 07:56:16 EDT Ventricular Rate:  71 PR Interval:  132 QRS Duration: 111 QT Interval:  436 QTC Calculation: 474 R Axis:   24 Text Interpretation: Sinus rhythm Probable left atrial enlargement Minimal ST depression, inferior leads Baseline wander in lead(s) V1 No significant change since prior 9/15 Confirmed by Aletta Edouard [phone]) on 02/06/2021 8:10:22 AM  Radiology DG Foot Complete Left  Result Date: 02/05/2021 CLINICAL DATA:  Black 5th toe EXAM: LEFT FOOT - COMPLETE 3+ VIEW COMPARISON:  None. FINDINGS: Areas of lucency noted in the distal aspect of the left 5th toe proximal phalanx concerning for osteomyelitis. No fracture. Early degenerative changes with hallux valgus deformity at the 1st MTP joint. Soft tissues are intact. No soft tissue gas. IMPRESSION: Lucency/bone destruction in the distal aspect of the left 5th toe proximal phalanx concerning for osteomyelitis. Electronically Signed   By: Rolm Baptise M.D.   On: 02/05/2021 21:59    Procedures .Critical Care  Date/Time: 02/06/2021 9:55 AM Performed by: Nettie Elm, PA-C Authorized by: Nettie Elm, PA-C   Critical care provider statement:    Critical care time (minutes):  45   Critical care was necessary to treat or prevent imminent or life-threatening deterioration of the following conditions:  Circulatory failure   Critical care was time spent personally by  me on the following activities:  Discussions with consultants, evaluation of patient's response to treatment, examination of patient, ordering and performing treatments and interventions, ordering and review of laboratory studies, ordering and review of radiographic studies, pulse oximetry,  re-evaluation of patient's condition, obtaining history from patient or surrogate and review of old charts   Medications Ordered in ED Medications  ceFEPIme (MAXIPIME) 2 g in sodium chloride 0.9 % 100 mL IVPB (2 g Intravenous New Bag/Given 02/06/21 0804)  vancomycin (VANCOREADY) IVPB 1750 mg/350 mL (has no administration in time range)  vancomycin (VANCOREADY) IVPB 1000 mg/200 mL (has no administration in time range)  sodium chloride 0.9 % bolus 500 mL (500 mLs Intravenous New Bag/Given 02/06/21 0759)  fentaNYL (SUBLIMAZE) injection 25 mcg (25 mcg Intravenous Given 02/06/21 0800)  ondansetron (ZOFRAN) injection 4 mg (4 mg Intravenous Given 02/06/21 0800)    ED Course  I have reviewed the triage vital signs and the nursing notes.  Pertinent labs & imaging results that were available during my care of the patient were reviewed by me and considered in my medical decision making (see chart for details).  Unfortunately patient with wait time greater then 10 hours in the waiting room prior to being moved to back for evaluation.   70 year old here for evaluation of left little toe pain.  She is afebrile, nonseptic appearing.  Apparently has had some pain to this toe x months.  Was actually seen here previously for this with reassuring WU.  I reviewed the imaging which not show any significant underlying abnormality at that time.  Toe turned black last week.  She is obviously necrotic left little toe.  She has cellulitis from dorsum of her left foot extending into her anterior leg. She has doppler pulses bilaterally.  Compartments soft. No clinical evidence of VTE. Remains LE seems well perfused. She has history of diabetes and hypertension and unfortunately has not been seen by PCP over the last few years.  She has no bony tenderness to her left tib-fib, proximal foot.  Heart and lungs clear.  Abdomen soft, nontender.  Appears at this toe has been necrotic for quite some time. Fortunately she does have  pulses bilaterally to her feet.   Work-up started from triage which I personally reviewed and interpreted:  CBC without leukocytosis Metabolic panel hypokalemia at 3.2, glucose 118 X-ray left foot shows osteomyelitis in left great toe. BC pending Lactic 1.3 COVID, Flu neg  Discussed with pharmacist.  Recommend Vanco and cefepime given history of diabetes.  We will plan for pain control, admission.  CONSULT with Jacqulynn Cadet, PA with Ortho who will assess patient in ED. Rec medicine admit  CONSULT with IM resident who agrees to evaluate patient for admission. Discussed with team may need possible vascular consult if Ortho feels this is more a vascular issue. ABI orders per admitting team.   Patient seen eval by attending, Dr. Melina Copa who agrees with above treatment, plan and disposition   The patient appears reasonably stabilized for admission considering the current resources, flow, and capabilities available in the ED at this time, and I doubt any other [facility] requiring further screening and/or treatment in the ED prior to admission.   Clinical Course as of 02/06/21 1513  Wed Jun 15, 752  5256 70 year old female diabetic here with left little toe pain for a few weeks, turned black.  No trauma.  Feels a little weak but otherwise is not systemically ill.  X-ray showing signs of bony erosion.  Will need  admission for antibiotics and likely operative intervention. [MB]    Clinical Course User Index [MB] Hayden Rasmussen, MD   MDM Rules/Calculators/A&P                           Final Clinical Impression(s) / ED Diagnoses Final diagnoses:  Osteomyelitis of left foot, unspecified type ([place])  Necrotic toes ([place])  Hypertension, unspecified type  Hyperglycemia  Cellulitis of left lower extremity    Rx / DC Orders ED Discharge Orders     None        Daleena Rotter A, PA-C 02/06/21 0956    Augusta Hilbert A, PA-C 02/06/21 1514    Hayden Rasmussen, MD 02/06/21 2101

## 2021-02-06 NOTE — Progress Notes (Signed)
Pharmacy Antibiotic Note  Tiffany [name] is a 70 y.o. female admitted on 02/05/2021 with Osteomyelitis. Pharmacy has been consulted for Vancomycin dosing.  Patient presented with severe cellulitis and necrosis of the toe. WBC 6.6, afebrile. CrCl >52ml/min.   Plan: Vancomycin 1750mg  x1 loading dose Then Vancomycin 1000mg  q12 (nomogram)  Patient also on Cefepime 2g q8hr F/u cultures, sensitivities and clinical picture  Height: 5\' 2"  (157.5 cm) Weight: 91 kg (200 lb 9.9 oz) IBW/kg (Calculated) : 50.1  Temp (24hrs), Avg:98.4 F (36.9 C), Min:98.1 F (36.7 C), Max:98.9 F (37.2 C)  Recent Labs  Lab 02/05/21 2125 02/06/21 0850  WBC 6.6  --   CREATININE 0.85  --   LATICACIDVEN  --  1.3    Estimated Creatinine Clearance: 65.6 mL/min (by C-G formula based on SCr of 0.85 mg/dL).    Allergies  Allergen Reactions   Cymbalta [Duloxetine Hcl] Other (See Comments)    Sweating, bad dreams,    Antimicrobials this admission: Vanc 6/15 >> Cefepime 6/15 >>  Dose adjustments this admission: NA  Microbiology results: 6/15 BCx: pend (one prior to abx)  Thank you for allowing pharmacy to be a part of this patient's care.  Norina Buzzard, PharmD PGY1 Pharmacy Resident 02/06/2021 10:26 AM

## 2021-02-06 NOTE — Consult Note (Signed)
ORTHOPAEDIC CONSULTATION  REQUESTING PHYSICIAN: Axel Filler, *  Chief Complaint: Painful ischemia with gait and of the little toe left foot  HPI: Tiffany [name] is a 70 y.o. female who presents with dry gangrene left foot little toe with ischemic pain of her left foot.  Patient has type 2 diabetes and states that she has been having pain for over a month in her left foot she states that her primary care physician provided pain medication for her pain.  She is status post a left total knee arthroplasty in 2014.  Past Medical History:  Diagnosis Date   Arthritis    osteoarthritis   GERD (gastroesophageal reflux disease)    Hx of bronchitis    Hypertension    Past Surgical History:  Procedure Laterality Date   KNEE SURGERY     arthroscopic right   toe nail removal     TOTAL KNEE ARTHROPLASTY Left 05/31/2013   Procedure: LEFT TOTAL KNEE ARTHROPLASTY;  Surgeon: Mauri Pole, MD;  Location: [facility];  Service: Orthopedics;  Laterality: Left;   Social History   Socioeconomic History   Marital status: Single    Spouse name: Not on file   Number of children: 2   Years of education: Not on file   Highest education level: Not on file  Occupational History    Employer: NOT EMPLOYED  Tobacco Use   Smoking status: Every Day    Packs/day: 0.25    Years: 20.00    Pack years: 5.00    Types: Cigarettes   Smokeless tobacco: Never   Tobacco comments:    5 cig daily   Substance and Sexual Activity   Alcohol use: No    Alcohol/week: 0.0 standard drinks   Drug use: No   Sexual activity: Not Currently  Other Topics Concern   Not on file  Social History Narrative   Daughter, son and grandson live with her.    Social Determinants of Health   Financial Resource Strain: Not on file  Food Insecurity: Not on file  Transportation Needs: Not on file  Physical Activity: Not on file  Stress: Not on file  Social Connections: Not on file   Family History  Problem Relation  Age of Onset   Heart disease Father        Died of CHF age 85   Heart disease Mother        Died of CHF age 28   Cancer Brother        Cancer   Heart failure Brother        Died of CHF    High blood pressure Son    Colon cancer Neg Hx    - negative except otherwise stated in the family history section Allergies  Allergen Reactions   Cymbalta [Duloxetine Hcl] Other (See Comments)    Sweating, bad dreams,   Prior to Admission medications   Medication Sig Start Date End Date Taking? Authorizing Provider  ALPRAZolam (XANAX) 0.5 MG tablet TAKE ONE-HALF TO ONE TABLET BY MOUTH THREE TIMES DAILY AS NEEDED FOR ANXIETY ATTACKS Patient not taking: No sig reported 03/06/16   Lauree Chandler, NP  amLODipine (NORVASC) 5 MG tablet Take 1 tablet (5 mg total) by mouth daily. Patient not taking: Reported on 02/06/2021 11/22/20   Quintella Reichert, MD  baclofen (LIORESAL) 10 MG tablet TAKE ONE-HALF TABLET BY MOUTH EVERY 8 HOURS AS NEEDED FOR MUSCLE SPASM (FOR KNEE/SPASM) Patient not taking: Reported on 02/06/2021 04/11/16  Lauree Chandler, NP  cloNIDine (CATAPRES) 0.1 MG tablet Take 1 tablet (0.1 mg total) by mouth daily. Patient not taking: Reported on 02/06/2021 03/06/16   Lauree Chandler, NP  HYDROcodone-acetaminophen (NORCO) 10-325 MG tablet Take 1 tablet by mouth every 6 (six) hours as needed. Prescription to be obtained from orthopedic office. Patient not taking: No sig reported 03/06/16   Lauree Chandler, NP  HYDROcodone-acetaminophen (NORCO) 10-325 MG tablet Take 1 tablet by mouth every 6 (six) hours as needed. Prescription to be obtained from orthopedic office. Patient not taking: No sig reported 03/06/16   Lauree Chandler, NP  ibuprofen (ADVIL) 600 MG tablet Take 1 tablet (600 mg total) by mouth every 6 (six) hours as needed. Patient not taking: Reported on 02/06/2021 12/04/20   Charlann Lange, PA-C  lisinopril (PRINIVIL,ZESTRIL) 10 MG tablet Take 1 tablet (10 mg total) by mouth  daily. Patient not taking: No sig reported 03/06/16   Lauree Chandler, NP  oseltamivir (TAMIFLU) 75 MG capsule Take 1 capsule (75 mg total) by mouth daily. Patient not taking: No sig reported 10/23/15   Lauree Chandler, NP  oxyCODONE-acetaminophen (PERCOCET) 7.5-325 MG tablet TK 1 T PO QID PRN PAIN Patient not taking: Reported on 02/06/2021 12/14/17   [provider]  pantoprazole (PROTONIX) 40 MG tablet Take 1 tablet (40 mg total) by mouth daily. Patient not taking: No sig reported 03/06/16   Lauree Chandler, NP   DG Foot Complete Left  Result Date: 02/05/2021 CLINICAL DATA:  Black 5th toe EXAM: LEFT FOOT - COMPLETE 3+ VIEW COMPARISON:  None. FINDINGS: Areas of lucency noted in the distal aspect of the left 5th toe proximal phalanx concerning for osteomyelitis. No fracture. Early degenerative changes with hallux valgus deformity at the 1st MTP joint. Soft tissues are intact. No soft tissue gas. IMPRESSION: Lucency/bone destruction in the distal aspect of the left 5th toe proximal phalanx concerning for osteomyelitis. Electronically Signed   By: Rolm Baptise M.D.   On: 02/05/2021 21:59   - pertinent xrays, CT, MRI studies were reviewed and independently interpreted  Positive ROS: All other systems have been reviewed and were otherwise negative with the exception of those mentioned in the HPI and as above.  Physical Exam: General: Alert, no acute distress Psychiatric: Patient is competent for consent with normal mood and affect Lymphatic: No axillary or cervical lymphadenopathy Cardiovascular: No pedal edema Respiratory: No cyanosis, no use of accessory musculature GI: No organomegaly, abdomen is soft and non-tender    Images:  @ENCIMAGES @  Labs:  Lab Results  Component Value Date   HGBA1C 6.8 (H) 05/15/2015   HGBA1C 6.9 (H) 08/29/2013   REPTSTATUS 11/25/2020 FINAL 11/22/2020   CULT MULTIPLE SPECIES PRESENT, SUGGEST RECOLLECTION (A) 11/22/2020    Lab Results   Component Value Date   ALBUMIN 3.6 02/05/2021   ALBUMIN 3.4 (L) 12/04/2020   ALBUMIN 3.4 (L) 11/22/2020     CBC EXTENDED Latest Ref Rng & Units 02/05/2021 12/04/2020 11/22/2020  WBC 4.0 - 10.5 K/uL 6.6 7.0 4.8  RBC 3.87 - 5.11 MIL/uL 4.74 4.30 4.61  HGB 12.0 - 15.0 g/dL 12.7 11.7(L) 12.6  HCT 36.0 - 46.0 % 41.2 38.3 40.4  PLT 150 - 400 K/uL 236 245 221  NEUTROABS 1.7 - 7.7 K/uL 4.9 5.0 3.3  LYMPHSABS 0.7 - 4.0 K/uL 1.2 1.4 1.0    Neurologic: Patient does not have protective sensation bilateral lower extremities.   MUSCULOSKELETAL:   Skin: Examination patient has dry gangrenous  changes to the left little toe there is no ascending cellulitis.  Patient has a palpable dorsalis pedis pulse on the right she does not have a palpable pulse on the left.  Patient was white blood cell count is 6.6 hemoglobin 12.7 I do not have a current hemoglobin A1c last value was in 2016.  Review the radiographs shows destructive bony changes of the left little toe consistent with chronic osteomyelitis.  Assessment: Assessment: Peripheral vascular disease with type 2 diabetes and gangrene of the left little toe.  Plan: Plan: We will request consultation with vascular vein surgery for arteriogram study of the left lower extremity patient does have an ABI ordered.  Thank you for the consult and the opportunity to see Ms. [place], MD [facility] [phone] 2:52 PM

## 2021-02-06 NOTE — Consult Note (Addendum)
Hospital Consult    Reason for Consult:  L fifth toe osteomyelitis Requesting Physician:  [facility] MRN #:  [id number]  History of Present Illness: This is a 70 y.o. female with past medical history significant for type 2 diabetes mellitus.  She presented to the emergency department with left fifth toe gangrene.  She does endorse some pain however the presence of gangrene is what prompted her and her daughter to come in for treatment.  Work-up included plain film which demonstrates bony changes consistent with osteomyelitis.  She is ambulatory and lives with her daughter.  She denies any claudication, rest pain, or other tissue changes.  She smokes 1/2 pack a day.  Past Medical History:  Diagnosis Date   Arthritis    osteoarthritis   GERD (gastroesophageal reflux disease)    Hx of bronchitis    Hypertension     Past Surgical History:  Procedure Laterality Date   KNEE SURGERY     arthroscopic right   toe nail removal     TOTAL KNEE ARTHROPLASTY Left 05/31/2013   Procedure: LEFT TOTAL KNEE ARTHROPLASTY;  Surgeon: Mauri Pole, MD;  Location: [facility];  Service: Orthopedics;  Laterality: Left;    Allergies  Allergen Reactions   Cymbalta [Duloxetine Hcl] Other (See Comments)    Sweating, bad dreams,    Prior to Admission medications   Medication Sig Start Date End Date Taking? Authorizing Provider  ALPRAZolam (XANAX) 0.5 MG tablet TAKE ONE-HALF TO ONE TABLET BY MOUTH THREE TIMES DAILY AS NEEDED FOR ANXIETY ATTACKS Patient not taking: No sig reported 03/06/16   Lauree Chandler, NP  amLODipine (NORVASC) 5 MG tablet Take 1 tablet (5 mg total) by mouth daily. Patient not taking: Reported on 02/06/2021 11/22/20   Quintella Reichert, MD  baclofen (LIORESAL) 10 MG tablet TAKE ONE-HALF TABLET BY MOUTH EVERY 8 HOURS AS NEEDED FOR MUSCLE SPASM (FOR KNEE/SPASM) Patient not taking: Reported on 02/06/2021 04/11/16   Lauree Chandler, NP  cloNIDine (CATAPRES) 0.1 MG tablet Take 1 tablet (0.1 mg total)  by mouth daily. Patient not taking: Reported on 02/06/2021 03/06/16   Lauree Chandler, NP  HYDROcodone-acetaminophen (NORCO) 10-325 MG tablet Take 1 tablet by mouth every 6 (six) hours as needed. Prescription to be obtained from orthopedic office. Patient not taking: No sig reported 03/06/16   Lauree Chandler, NP  HYDROcodone-acetaminophen (NORCO) 10-325 MG tablet Take 1 tablet by mouth every 6 (six) hours as needed. Prescription to be obtained from orthopedic office. Patient not taking: No sig reported 03/06/16   Lauree Chandler, NP  ibuprofen (ADVIL) 600 MG tablet Take 1 tablet (600 mg total) by mouth every 6 (six) hours as needed. Patient not taking: Reported on 02/06/2021 12/04/20   Charlann Lange, PA-C  lisinopril (PRINIVIL,ZESTRIL) 10 MG tablet Take 1 tablet (10 mg total) by mouth daily. Patient not taking: No sig reported 03/06/16   Lauree Chandler, NP  oseltamivir (TAMIFLU) 75 MG capsule Take 1 capsule (75 mg total) by mouth daily. Patient not taking: No sig reported 10/23/15   Lauree Chandler, NP  oxyCODONE-acetaminophen (PERCOCET) 7.5-325 MG tablet TK 1 T PO QID PRN PAIN Patient not taking: Reported on 02/06/2021 12/14/17   [provider]  pantoprazole (PROTONIX) 40 MG tablet Take 1 tablet (40 mg total) by mouth daily. Patient not taking: No sig reported 03/06/16   Lauree Chandler, NP    Social History   Socioeconomic History   Marital status: Single    Spouse  name: Not on file   Number of children: 2   Years of education: Not on file   Highest education level: Not on file  Occupational History    Employer: NOT EMPLOYED  Tobacco Use   Smoking status: Every Day    Packs/day: 0.25    Years: 20.00    Pack years: 5.00    Types: Cigarettes   Smokeless tobacco: Never   Tobacco comments:    5 cig daily   Substance and Sexual Activity   Alcohol use: No    Alcohol/week: 0.0 standard drinks   Drug use: No   Sexual activity: Not Currently  Other Topics  Concern   Not on file  Social History Narrative   Daughter, son and grandson live with her.    Social Determinants of Health   Financial Resource Strain: Not on file  Food Insecurity: Not on file  Transportation Needs: Not on file  Physical Activity: Not on file  Stress: Not on file  Social Connections: Not on file  Intimate Partner Violence: Not on file     Family History  Problem Relation Age of Onset   Heart disease Father        Died of CHF age 4   Heart disease Mother        Died of CHF age 70   Cancer Brother        Cancer   Heart failure Brother        Died of CHF    High blood pressure Son    Colon cancer Neg Hx     ROS: Otherwise negative unless mentioned in HPI  Physical Examination  Vitals:   02/06/21 1200 02/06/21 1300  BP: 126/81 130/89  Pulse:  83  Resp:  14  Temp:  98 F (36.7 C)  SpO2:  99%   Body mass index is 36.69 kg/m.  General:  WDWN in NAD Gait: Not observed HENT: WNL, normocephalic Pulmonary: normal non-labored breathing, without Rales, rhonchi,  wheezing Cardiac: regular Abdomen:  soft, NT/ND, no masses Skin: without rashes Vascular Exam/Pulses: Palpable right DP pulse: Left DP by Doppler Extremities: Left fifth toe dry gangrene Musculoskeletal: no muscle wasting or atrophy  Neurologic: A&O X 3;  No focal weakness or paresthesias are detected; speech is fluent/normal Psychiatric:  The pt has Normal affect. Lymph:  Unremarkable  CBC    Component Value Date/Time   WBC 6.6 02/05/2021 2125   RBC 4.74 02/05/2021 2125   HGB 12.7 02/05/2021 2125   HGB 13.6 05/15/2015 1108   HCT 41.2 02/05/2021 2125   HCT 42.2 05/15/2015 1108   PLT 236 02/05/2021 2125   PLT 280 05/15/2015 1108   MCV 86.9 02/05/2021 2125   MCV 84 05/15/2015 1108   MCH 26.8 02/05/2021 2125   MCHC 30.8 02/05/2021 2125   RDW 14.4 02/05/2021 2125   RDW 15.4 05/15/2015 1108   LYMPHSABS 1.2 02/05/2021 2125   LYMPHSABS 1.6 05/15/2015 1108   MONOABS 0.4  02/05/2021 2125   EOSABS 0.1 02/05/2021 2125   EOSABS 0.1 05/15/2015 1108   BASOSABS 0.0 02/05/2021 2125   BASOSABS 0.0 05/15/2015 1108    BMET    Component Value Date/Time   NA 141 02/05/2021 2125   NA 143 05/15/2015 1108   K 3.2 (L) 02/05/2021 2125   CL 103 02/05/2021 2125   CO2 28 02/05/2021 2125   GLUCOSE 118 (H) 02/05/2021 2125   BUN 7 (L) 02/05/2021 2125   BUN 13 05/15/2015 1108  CREATININE 0.85 02/05/2021 2125   CALCIUM 9.0 02/05/2021 2125   GFRNONAA >60 02/05/2021 2125   GFRAA >60 12/20/2017 1529    COAGS: Lab Results  Component Value Date   INR 1.0 02/06/2021   INR 1.05 05/23/2013     Non-Invasive Vascular Imaging:   Plain film suggesting osteomyelitis of left fifth toe    ASSESSMENT/PLAN: This is a 70 y.o. female with left fifth toe gangrene  -Patient has a palpable right DP pulse however I am unable to palpate any pulses on her left lower extremity; she does have a brisk DP signal by Doppler -ABIs have been ordered -Plan will likely be for left lower extremity arteriogram and possible intervention.  This procedure was explained to the patient including risks and benefits and she agrees to proceed -On-call vascular surgeon Dr. Trula Slade will evaluate the patient later today and provide further treatment plans including timing of angiography   Dagoberto Ligas PA-C [facility] [phone]   Briefly, this is a 70 year old female with gangrenous changes to the left fifth toe that has been going on for over a month.  Pain prompted her to come to the emergency department.  She is a diabetic.  On physical exam she has a palpable right dorsalis pedis pulse but nonpalpable left.  I discussed with the patient that we will need to get baseline ABIs as well as proceed with angiography prior to her toe amputation to optimize her chances for healing of her amputation.  I discussed the risks and benefits as well as the details of angiography and she  wishes to proceed.  She will be placed on the Cath Lab schedule for this Friday.  She will need to be n.p.o. after midnight on Thursday.  Annamarie Major

## 2021-02-06 NOTE — ED Notes (Signed)
One set of blood cultures obtained prior to abx started and before order placed by provider.

## 2021-02-06 NOTE — H&P (View-Only) (Signed)
Hospital Consult    Reason for Consult:  L fifth toe osteomyelitis Requesting Physician:  [facility] MRN #:  [id number]  History of Present Illness: This is a 70 y.o. female with past medical history significant for type 2 diabetes mellitus.  She presented to the emergency department with left fifth toe gangrene.  She does endorse some pain however the presence of gangrene is what prompted her and her daughter to come in for treatment.  Work-up included plain film which demonstrates bony changes consistent with osteomyelitis.  She is ambulatory and lives with her daughter.  She denies any claudication, rest pain, or other tissue changes.  She smokes 1/2 pack a day.  Past Medical History:  Diagnosis Date   Arthritis    osteoarthritis   GERD (gastroesophageal reflux disease)    Hx of bronchitis    Hypertension     Past Surgical History:  Procedure Laterality Date   KNEE SURGERY     arthroscopic right   toe nail removal     TOTAL KNEE ARTHROPLASTY Left 05/31/2013   Procedure: LEFT TOTAL KNEE ARTHROPLASTY;  Surgeon: Mauri Pole, MD;  Location: [facility];  Service: Orthopedics;  Laterality: Left;    Allergies  Allergen Reactions   Cymbalta [Duloxetine Hcl] Other (See Comments)    Sweating, bad dreams,    Prior to Admission medications   Medication Sig Start Date End Date Taking? Authorizing Provider  ALPRAZolam (XANAX) 0.5 MG tablet TAKE ONE-HALF TO ONE TABLET BY MOUTH THREE TIMES DAILY AS NEEDED FOR ANXIETY ATTACKS Patient not taking: No sig reported 03/06/16   Lauree Chandler, NP  amLODipine (NORVASC) 5 MG tablet Take 1 tablet (5 mg total) by mouth daily. Patient not taking: Reported on 02/06/2021 11/22/20   Quintella Reichert, MD  baclofen (LIORESAL) 10 MG tablet TAKE ONE-HALF TABLET BY MOUTH EVERY 8 HOURS AS NEEDED FOR MUSCLE SPASM (FOR KNEE/SPASM) Patient not taking: Reported on 02/06/2021 04/11/16   Lauree Chandler, NP  cloNIDine (CATAPRES) 0.1 MG tablet Take 1 tablet (0.1 mg total)  by mouth daily. Patient not taking: Reported on 02/06/2021 03/06/16   Lauree Chandler, NP  HYDROcodone-acetaminophen (NORCO) 10-325 MG tablet Take 1 tablet by mouth every 6 (six) hours as needed. Prescription to be obtained from orthopedic office. Patient not taking: No sig reported 03/06/16   Lauree Chandler, NP  HYDROcodone-acetaminophen (NORCO) 10-325 MG tablet Take 1 tablet by mouth every 6 (six) hours as needed. Prescription to be obtained from orthopedic office. Patient not taking: No sig reported 03/06/16   Lauree Chandler, NP  ibuprofen (ADVIL) 600 MG tablet Take 1 tablet (600 mg total) by mouth every 6 (six) hours as needed. Patient not taking: Reported on 02/06/2021 12/04/20   Charlann Lange, PA-C  lisinopril (PRINIVIL,ZESTRIL) 10 MG tablet Take 1 tablet (10 mg total) by mouth daily. Patient not taking: No sig reported 03/06/16   Lauree Chandler, NP  oseltamivir (TAMIFLU) 75 MG capsule Take 1 capsule (75 mg total) by mouth daily. Patient not taking: No sig reported 10/23/15   Lauree Chandler, NP  oxyCODONE-acetaminophen (PERCOCET) 7.5-325 MG tablet TK 1 T PO QID PRN PAIN Patient not taking: Reported on 02/06/2021 12/14/17   [provider]  pantoprazole (PROTONIX) 40 MG tablet Take 1 tablet (40 mg total) by mouth daily. Patient not taking: No sig reported 03/06/16   Lauree Chandler, NP    Social History   Socioeconomic History   Marital status: Single    Spouse  name: Not on file   Number of children: 2   Years of education: Not on file   Highest education level: Not on file  Occupational History    Employer: NOT EMPLOYED  Tobacco Use   Smoking status: Every Day    Packs/day: 0.25    Years: 20.00    Pack years: 5.00    Types: Cigarettes   Smokeless tobacco: Never   Tobacco comments:    5 cig daily   Substance and Sexual Activity   Alcohol use: No    Alcohol/week: 0.0 standard drinks   Drug use: No   Sexual activity: Not Currently  Other Topics  Concern   Not on file  Social History Narrative   Daughter, son and grandson live with her.    Social Determinants of Health   Financial Resource Strain: Not on file  Food Insecurity: Not on file  Transportation Needs: Not on file  Physical Activity: Not on file  Stress: Not on file  Social Connections: Not on file  Intimate Partner Violence: Not on file     Family History  Problem Relation Age of Onset   Heart disease Father        Died of CHF age 54   Heart disease Mother        Died of CHF age 39   Cancer Brother        Cancer   Heart failure Brother        Died of CHF    High blood pressure Son    Colon cancer Neg Hx     ROS: Otherwise negative unless mentioned in HPI  Physical Examination  Vitals:   02/06/21 1200 02/06/21 1300  BP: 126/81 130/89  Pulse:  83  Resp:  14  Temp:  98 F (36.7 C)  SpO2:  99%   Body mass index is 36.69 kg/m.  General:  WDWN in NAD Gait: Not observed HENT: WNL, normocephalic Pulmonary: normal non-labored breathing, without Rales, rhonchi,  wheezing Cardiac: regular Abdomen:  soft, NT/ND, no masses Skin: without rashes Vascular Exam/Pulses: Palpable right DP pulse: Left DP by Doppler Extremities: Left fifth toe dry gangrene Musculoskeletal: no muscle wasting or atrophy  Neurologic: A&O X 3;  No focal weakness or paresthesias are detected; speech is fluent/normal Psychiatric:  The pt has Normal affect. Lymph:  Unremarkable  CBC    Component Value Date/Time   WBC 6.6 02/05/2021 2125   RBC 4.74 02/05/2021 2125   HGB 12.7 02/05/2021 2125   HGB 13.6 05/15/2015 1108   HCT 41.2 02/05/2021 2125   HCT 42.2 05/15/2015 1108   PLT 236 02/05/2021 2125   PLT 280 05/15/2015 1108   MCV 86.9 02/05/2021 2125   MCV 84 05/15/2015 1108   MCH 26.8 02/05/2021 2125   MCHC 30.8 02/05/2021 2125   RDW 14.4 02/05/2021 2125   RDW 15.4 05/15/2015 1108   LYMPHSABS 1.2 02/05/2021 2125   LYMPHSABS 1.6 05/15/2015 1108   MONOABS 0.4  02/05/2021 2125   EOSABS 0.1 02/05/2021 2125   EOSABS 0.1 05/15/2015 1108   BASOSABS 0.0 02/05/2021 2125   BASOSABS 0.0 05/15/2015 1108    BMET    Component Value Date/Time   NA 141 02/05/2021 2125   NA 143 05/15/2015 1108   K 3.2 (L) 02/05/2021 2125   CL 103 02/05/2021 2125   CO2 28 02/05/2021 2125   GLUCOSE 118 (H) 02/05/2021 2125   BUN 7 (L) 02/05/2021 2125   BUN 13 05/15/2015 1108  CREATININE 0.85 02/05/2021 2125   CALCIUM 9.0 02/05/2021 2125   GFRNONAA >60 02/05/2021 2125   GFRAA >60 12/20/2017 1529    COAGS: Lab Results  Component Value Date   INR 1.0 02/06/2021   INR 1.05 05/23/2013     Non-Invasive Vascular Imaging:   Plain film suggesting osteomyelitis of left fifth toe    ASSESSMENT/PLAN: This is a 70 y.o. female with left fifth toe gangrene  -Patient has a palpable right DP pulse however I am unable to palpate any pulses on her left lower extremity; she does have a brisk DP signal by Doppler -ABIs have been ordered -Plan will likely be for left lower extremity arteriogram and possible intervention.  This procedure was explained to the patient including risks and benefits and she agrees to proceed -On-call vascular surgeon Dr. Trula Slade will evaluate the patient later today and provide further treatment plans including timing of angiography   Dagoberto Ligas PA-C [facility] [phone]   Briefly, this is a 70 year old female with gangrenous changes to the left fifth toe that has been going on for over a month.  Pain prompted her to come to the emergency department.  She is a diabetic.  On physical exam she has a palpable right dorsalis pedis pulse but nonpalpable left.  I discussed with the patient that we will need to get baseline ABIs as well as proceed with angiography prior to her toe amputation to optimize her chances for healing of her amputation.  I discussed the risks and benefits as well as the details of angiography and she  wishes to proceed.  She will be placed on the Cath Lab schedule for this Friday.  She will need to be n.p.o. after midnight on Thursday.  Annamarie Major

## 2021-02-06 NOTE — ED Notes (Addendum)
Pt called out again asking for non diet soda, explained again that pt has a diabetic diet ordered and could have a non diet soda. Pt also asked again for a tuna fish sandwich, told again that no tuna was available in the ED.

## 2021-02-06 NOTE — H&P (Addendum)
Date: 02/06/2021               Patient Name:  Tiffany [name] MRN: [id number]  DOB: 1951/01/22 Age / Sex: 70 y.o., female   PCP: Mardi Mainland, FNP         Medical Service: Internal Medicine Teaching Service         Attending Physician: Dr. Hayden Rasmussen, MD    First Contact: Dr. Johnney Ou Pager: [phone]  Second Contact: Dr. Charleen Kirks Pager: [phone]       After Hours (After 5p/  First Contact Pager: [phone]  weekends / holidays): Second Contact Pager: [phone]   Chief Complaint: Foot pain  History of Present Illness:  Ms.Ramirez is a 70 yo F w/ PMH of HTN, T2DM, Anxiety/depression presenting to [facility] with complaint of L fifth toe pain. Ms.Santillanes was examined and evaluated at bedside this am with daughter, Tippi Mccrae, at bedside. She was in her usual state of health until 2 months prior when she developed worsening toe pain without obvious inciting event. She mentions she was walking normally and noticed insidious onset progressively worsening pain with blister and grayish discoloration of the toe. She describes the pain as dull soreness without radiation and did not notice any open wounds or ulcers beside the blister. She was evaluated at [facility] ED at the time and was advised to take ibuprofen as needed and follow up with PCP. Her symptoms progressively worsened despite ibuprofen use and she was also unable to establish care with PCP due to family stressors as well as recent incarceration. She came to ED yesterday for eval due to worsening progression of her symptoms.   She mentions that her daughter and she recently moved from [place] five months ago and has had multiple issues with recent death of family and unstable living condition. She has not yet established care with a PCP in the area and she has not been taking any of her chronic medications although she is unable to recall her previously prescribed medications.  On review of systems, she mentions some  subjective fevers, chills and nausea. Denies any vomiting, diarrhea, constipation, chest pain, palpitations, dyspnea, cough, focal weakness, blurry vision, headache.  Meds:  Tylenol PM 500mg  PRN  Allergies: Allergies as of 02/05/2021 - Review Complete 02/05/2021  Allergen Reaction Noted   Cymbalta [duloxetine hcl] Other (See Comments) 07/27/2014   Past Medical History:  Diagnosis Date   Arthritis    osteoarthritis   GERD (gastroesophageal reflux disease)    Hx of bronchitis    Hypertension    Family History:  Mother had diabetes. Denies any other clinically significant   Social History: Moved from [place] 5 months ago. Used to work as Marine scientist, currently retired. Denies alcohol, illicit substance use. 1 pack daily tobacco use for 50 years.  Review of Systems: A complete ROS was negative except as per HPI.   Physical Exam: Blood pressure (!) 177/96, pulse 70, temperature 98.2 F (36.8 C), resp. rate 14, height 5\' 2"  (1.575 m), weight 91 kg, SpO2 100 %.  Gen: Well-developed, well nourished, NAD HEENT: NCAT head, hearing intact, EOMI, PERRL, MMM Neck: supple, ROM intact CV: RRR, S1, S2 normal, No rubs, no murmurs, no gallops Pulm: CTAB, No rales, no wheezes Abd: Soft, BS+, NTND, No rebound, no guarding Extm: ROM intact, Diminished pulses LLE, necrotic L phalanx with minimal strength or sensation with oozing bloody discharge w/ erythema extending to dorsum of left foot Neuro: AAOx3, Diminished  sensation of L fifth phalanx Psych: Normal mood and affect  EKG: personally reviewed my interpretation is + motion artifact, flat T waves on lateral leads similar to prior EKGs  CXR: N/A  Assessment & Plan by Problem: Active Problems:   * No active hospital problems. *  Ms.Pai is a 70 yo F w/ PMH of HTN, T2DM, Anxiety/depression presenting to [facility] with complaint of L fifth toe pain due to L fifth phalanx osteomyelitis.  L fifth phalanx osteomyelitis with gangrene Present with  L fifth toe pain. Obvious gangrene on exam. X-ray suggests bone involvement. Dorsalis pedis pulse present, L foot digits other than fifth phalanx well perfused. Has hx of diabetes not on any current treatment. Will likely need amputation. No evidence of systemic illness. Lactate 1.3. Ortho on board. Started on vanc/cefepime in ED for likely polymicrobial infection - Appreciate orthopedic surgery recommendations -  No risk factors for pseudomonas, can narrow to vanc/ceftriaxone/metronidazole - F/u cultures - Tylenol, oxy for pain  T2DM Last hgb a1c 6.8 from 2016. Not on any meds at home. CBG 118 on admission - Hgb a1c - Glucose checks - SSI  HTN Admit bp 177/96. Prescribed amlodipine 5mg  on prior ED visit. Does not take other anti-hypertensives at home - Resume amlodipine 5mg  daily - Monitor  DVT prophx: subqhep Diet: Npo in case of surgery Bowel: Miralax Code: Full  Prior to Admission Living Arrangement: Home Anticipated Discharge Location: SNF Barriers to Discharge: Medical tx  Dispo: Admit patient to Inpatient with expected length of stay greater than 2 midnights.  Signed: Mosetta Anis, MD 02/06/2021, 10:10 AM Pager: [phone] After 5pm on weekdays and 1pm on weekends: On Call Pager: [phone]

## 2021-02-06 NOTE — ED Notes (Signed)
Pt eating lunch tray but asked for cheese and crackers, but there are none, then asked for [place] sandwich bag but there are none, Pt provided with graham crackers and peanut butter x2.

## 2021-02-06 NOTE — Consult Note (Signed)
Reason for Consult:Left little toe gangrene Referring Physician: Lalla Brothers Time called: 0825 Time at bedside: [place] is an 70 y.o. female.  HPI: Tiffany [name] was brought to the ED with left little toe gangrene. She notes this has been going on for about 6 weeks. It started with toe pain and discoloration. She was seen in the [facility] ED and diagnosed with a contusion and given supportive care. The toe continued to discolor and hurt until it has ended up in it's present form. The patient notes some pain proximal to the toe but denies fevers, chills, sweats, or N/V. No prior hx/o foot ulcers. She lives with her daughter and is a retired [employer] scientist.  Past Medical History:  Diagnosis Date   Arthritis    osteoarthritis   GERD (gastroesophageal reflux disease)    Hx of bronchitis    Hypertension     Past Surgical History:  Procedure Laterality Date   KNEE SURGERY     arthroscopic right   toe nail removal     TOTAL KNEE ARTHROPLASTY Left 05/31/2013   Procedure: LEFT TOTAL KNEE ARTHROPLASTY;  Surgeon: Mauri Pole, MD;  Location: [facility];  Service: Orthopedics;  Laterality: Left;    Family History  Problem Relation Age of Onset   Heart disease Father        Died of CHF age 59   Heart disease Mother        Died of CHF age 57   Cancer Brother        Cancer   Heart failure Brother        Died of CHF    High blood pressure Son    Colon cancer Neg Hx     Social History:  reports that she has been smoking cigarettes. She has a 5.00 pack-year smoking history. She has never used smokeless tobacco. She reports that she does not drink alcohol and does not use drugs.  Allergies:  Allergies  Allergen Reactions   Cymbalta [Duloxetine Hcl] Other (See Comments)    Sweating, bad dreams,    Medications: I have reviewed the patient's current medications.  Results for orders placed or performed during the hospital encounter of 02/05/21 (from the past 48 hour(s))  CBC with  Differential/Platelet     Status: None   Collection Time: 02/05/21  9:25 PM  Result Value Ref Range   WBC 6.6 4.0 - 10.5 K/uL   RBC 4.74 3.87 - 5.11 MIL/uL   Hemoglobin 12.7 12.0 - 15.0 g/dL   HCT 41.2 36.0 - 46.0 %   MCV 86.9 80.0 - 100.0 fL   MCH 26.8 26.0 - 34.0 pg   MCHC 30.8 30.0 - 36.0 g/dL   RDW 14.4 11.5 - 15.5 %   Platelets 236 150 - 400 K/uL   nRBC 0.0 0.0 - 0.2 %   Neutrophils Relative % 74 %   Neutro Abs 4.9 1.7 - 7.7 K/uL   Lymphocytes Relative 19 %   Lymphs Abs 1.2 0.7 - 4.0 K/uL   Monocytes Relative 5 %   Monocytes Absolute 0.4 0.1 - 1.0 K/uL   Eosinophils Relative 2 %   Eosinophils Absolute 0.1 0.0 - 0.5 K/uL   Basophils Relative 0 %   Basophils Absolute 0.0 0.0 - 0.1 K/uL   Immature Granulocytes 0 %   Abs Immature Granulocytes 0.01 0.00 - 0.07 K/uL    Comment: Performed at [facility], [address], [place], [place] [address]  Comprehensive  metabolic panel     Status: Abnormal   Collection Time: 02/05/21  9:25 PM  Result Value Ref Range   Sodium 141 135 - 145 mmol/L   Potassium 3.2 (L) 3.5 - 5.1 mmol/L   Chloride 103 98 - 111 mmol/L   CO2 28 22 - 32 mmol/L   Glucose, Bld 118 (H) 70 - 99 mg/dL    Comment: Glucose reference range applies only to samples taken after fasting for at least 8 hours.   BUN 7 (L) 8 - 23 mg/dL   Creatinine, Ser 0.85 0.44 - 1.00 mg/dL   Calcium 9.0 8.9 - 10.3 mg/dL   Total Protein 7.6 6.5 - 8.1 g/dL   Albumin 3.6 3.5 - 5.0 g/dL   AST 17 15 - 41 U/L   ALT 18 0 - 44 U/L   Alkaline Phosphatase 94 38 - 126 U/L   Total Bilirubin 0.6 0.3 - 1.2 mg/dL   GFR, Estimated >60 >60 mL/min    Comment: (NOTE) Calculated using the CKD-EPI Creatinine Equation (2021)    Anion gap 10 5 - 15    Comment: Performed at [facility] [address], [place], [place] [address]  CBG monitoring, ED     Status: None   Collection Time: 02/06/21  1:51 AM  Result Value Ref Range   Glucose-Capillary 95 70 - 99 mg/dL    Comment: Glucose  reference range applies only to samples taken after fasting for at least 8 hours.  Resp Panel by RT-PCR (Flu A&B, Covid) Nasopharyngeal Swab     Status: None   Collection Time: 02/06/21  7:26 AM   Specimen: Nasopharyngeal Swab; Nasopharyngeal(NP) swabs in vial transport medium  Result Value Ref Range   SARS Coronavirus 2 by RT PCR NEGATIVE NEGATIVE    Comment: (NOTE) SARS-CoV-2 target nucleic acids are NOT DETECTED.  The SARS-CoV-2 RNA is generally detectable in upper respiratory specimens during the acute phase of infection. The lowest concentration of SARS-CoV-2 viral copies this assay can detect is 138 copies/mL. A negative result does not preclude SARS-Cov-2 infection and should not be used as the sole basis for treatment or other patient management decisions. A negative result may occur with  improper specimen collection/handling, submission of specimen other than nasopharyngeal swab, presence of viral mutation(s) within the areas targeted by this assay, and inadequate number of viral copies(<138 copies/mL). A negative result must be combined with clinical observations, patient history, and epidemiological information. The expected result is Negative.  Fact Sheet for Patients:  EntrepreneurPulse.com.au  Fact Sheet for Healthcare Providers:  IncredibleEmployment.be  This test is no t yet approved or cleared by the [place] FDA and  has been authorized for detection and/or diagnosis of SARS-CoV-2 by FDA under an Emergency Use Authorization (EUA). This EUA will remain  in effect (meaning this test can be used) for the duration of the COVID-19 declaration under Section 564(b)(1) of the Act, 21 U.S.C.section 360bbb-3(b)(1), unless the authorization is terminated  or revoked sooner.       Influenza A by PCR NEGATIVE NEGATIVE   Influenza B by PCR NEGATIVE NEGATIVE    Comment: (NOTE) The Xpert Xpress SARS-CoV-2/FLU/RSV plus assay is  intended as an aid in the diagnosis of influenza from Nasopharyngeal swab specimens and should not be used as a sole basis for treatment. Nasal washings and aspirates are unacceptable for Xpert Xpress SARS-CoV-2/FLU/RSV testing.  Fact Sheet for Patients: EntrepreneurPulse.com.au  Fact Sheet for Healthcare Providers: IncredibleEmployment.be  This test is not  yet approved or cleared by the [place] and has been authorized for detection and/or diagnosis of SARS-CoV-2 by FDA under an Emergency Use Authorization (EUA). This EUA will remain in effect (meaning this test can be used) for the duration of the COVID-19 declaration under Section 564(b)(1) of the Act, 21 U.S.C. section 360bbb-3(b)(1), unless the authorization is terminated or revoked.  Performed at [facility], [place] [address], [place], [place] [address]   Lactic acid, plasma     Status: None   Collection Time: 02/06/21  8:50 AM  Result Value Ref Range   Lactic Acid, Venous 1.3 0.5 - 1.9 mmol/L    Comment: Performed at [facility] [address], [place], [place] [address]  Protime-INR     Status: None   Collection Time: 02/06/21  9:43 AM  Result Value Ref Range   Prothrombin Time 13.5 11.4 - 15.2 seconds   INR 1.0 0.8 - 1.2    Comment: (NOTE) INR goal varies based on device and disease states. Performed at [facility], [place] [address] [place], [place] [address]     DG Foot Complete Left  Result Date: 02/05/2021 CLINICAL DATA:  Black 5th toe EXAM: LEFT FOOT - COMPLETE 3+ VIEW COMPARISON:  None. FINDINGS: Areas of lucency noted in the distal aspect of the left 5th toe proximal phalanx concerning for osteomyelitis. No fracture. Early degenerative changes with hallux valgus deformity at the 1st MTP joint. Soft tissues are intact. No soft tissue gas. IMPRESSION: Lucency/bone destruction in the distal aspect of the left 5th toe proximal phalanx concerning for  osteomyelitis. Electronically Signed   By: Rolm Baptise M.D.   On: 02/05/2021 21:59    Review of Systems  Constitutional:  Negative for chills, diaphoresis and fever.  HENT:  Negative for ear discharge, ear pain, hearing loss and tinnitus.   Eyes:  Negative for photophobia and pain.  Respiratory:  Negative for cough and shortness of breath.   Cardiovascular:  Negative for chest pain.  Gastrointestinal:  Negative for abdominal pain, nausea and vomiting.  Genitourinary:  Negative for dysuria, flank pain, frequency and urgency.  Musculoskeletal:  Positive for arthralgias (Left laterel foot). Negative for back pain, myalgias and neck pain.  Neurological:  Negative for dizziness and headaches.  Hematological:  Does not bruise/bleed easily.  Psychiatric/Behavioral:  The patient is not nervous/anxious.   Blood pressure (!) 177/96, pulse 70, temperature 98.2 F (36.8 C), resp. rate 14, height 5\' 2"  (1.575 m), weight 91 kg, SpO2 100 %. Physical Exam Constitutional:      General: She is not in acute distress.    Appearance: She is well-developed. She is not diaphoretic.  HENT:     Head: Normocephalic and atraumatic.  Eyes:     General: No scleral icterus.       Right eye: No discharge.        Left eye: No discharge.     Conjunctiva/sclera: Conjunctivae normal.  Cardiovascular:     Rate and Rhythm: Normal rate and regular rhythm.  Pulmonary:     Effort: Pulmonary effort is normal. No respiratory distress.  Musculoskeletal:     Cervical back: Normal range of motion.  Feet:     Comments: Left foot: Dry gangrene little toe, mild NP edema forefoot. No streaking erythema. Absent palpable DP/PT. DPN, SPN, TN intact. Skin:    General: Skin is warm and dry.  Neurological:     Mental Status: She is alert.  Psychiatric:  Mood and Affect: Mood normal.        Behavior: Behavior normal.    Assessment/Plan: Left 5th toe gangrene -- Will check ABI's. Will need toe amputation once PAD worked  up and potentially treated. Dr. Sharol Given to evaluate later today or in AM.    Lisette Abu, PA-C Orthopedic Surgery [phone] 02/06/2021, 10:22 AM

## 2021-02-07 ENCOUNTER — Encounter (HOSPITAL_COMMUNITY): Payer: Medicare (Managed Care)

## 2021-02-07 ENCOUNTER — Inpatient Hospital Stay (HOSPITAL_COMMUNITY): Payer: Medicare (Managed Care)

## 2021-02-07 DIAGNOSIS — I96 Gangrene, not elsewhere classified: Secondary | ICD-10-CM

## 2021-02-07 DIAGNOSIS — I739 Peripheral vascular disease, unspecified: Secondary | ICD-10-CM

## 2021-02-07 DIAGNOSIS — E1152 Type 2 diabetes mellitus with diabetic peripheral angiopathy with gangrene: Secondary | ICD-10-CM

## 2021-02-07 LAB — GLUCOSE, CAPILLARY
Glucose-Capillary: 102 mg/dL — ABNORMAL HIGH (ref 70–99)
Glucose-Capillary: 110 mg/dL — ABNORMAL HIGH (ref 70–99)
Glucose-Capillary: 119 mg/dL — ABNORMAL HIGH (ref 70–99)
Glucose-Capillary: 160 mg/dL — ABNORMAL HIGH (ref 70–99)
Glucose-Capillary: 72 mg/dL (ref 70–99)
Glucose-Capillary: 80 mg/dL (ref 70–99)
Glucose-Capillary: 93 mg/dL (ref 70–99)

## 2021-02-07 LAB — LIPID PANEL
Cholesterol: 107 mg/dL (ref 0–200)
HDL: 35 mg/dL — ABNORMAL LOW (ref >40)
LDL Cholesterol: 58 mg/dL (ref 0–99)
Total CHOL/HDL Ratio: 3.1 RATIO
Triglycerides: 70 mg/dL (ref <150)
VLDL: 14 mg/dL (ref 0–40)

## 2021-02-07 LAB — CBC
HCT: 37.5 % (ref 36.0–46.0)
Hemoglobin: 11.6 g/dL — ABNORMAL LOW (ref 12.0–15.0)
MCH: 27.1 pg (ref 26.0–34.0)
MCHC: 30.9 g/dL (ref 30.0–36.0)
MCV: 87.6 fL (ref 80.0–100.0)
Platelets: 202 10*3/uL (ref 150–400)
RBC: 4.28 MIL/uL (ref 3.87–5.11)
RDW: 14.7 % (ref 11.5–15.5)
WBC: 5.2 10*3/uL (ref 4.0–10.5)
nRBC: 0 % (ref 0.0–0.2)

## 2021-02-07 LAB — HEMOGLOBIN A1C
Hgb A1c MFr Bld: 6.1 % — ABNORMAL HIGH (ref 4.8–5.6)
Mean Plasma Glucose: 128 mg/dL

## 2021-02-07 LAB — BASIC METABOLIC PANEL
Anion gap: 8 (ref 5–15)
BUN: 18 mg/dL (ref 8–23)
CO2: 27 mmol/L (ref 22–32)
Calcium: 8.5 mg/dL — ABNORMAL LOW (ref 8.9–10.3)
Chloride: 106 mmol/L (ref 98–111)
Creatinine, Ser: 0.91 mg/dL (ref 0.44–1.00)
GFR, Estimated: >60 mL/min (ref >60)
Glucose, Bld: 97 mg/dL (ref 70–99)
Potassium: 3.5 mmol/L (ref 3.5–5.1)
Sodium: 141 mmol/L (ref 135–145)

## 2021-02-07 MED ORDER — SODIUM CHLORIDE 0.9 % IV SOLN
2.0000 g | INTRAVENOUS | Status: DC
  Administered 2021-02-07 – 2021-02-08 (×2): 2 g via INTRAVENOUS
  Filled 2021-02-07 (×2): qty 20

## 2021-02-07 MED ORDER — VANCOMYCIN HCL 1000 MG/200ML IV SOLN
1000.0000 mg | INTRAVENOUS | Status: DC
  Administered 2021-02-08: 1000 mg via INTRAVENOUS
  Filled 2021-02-07: qty 200

## 2021-02-07 NOTE — Progress Notes (Addendum)
   VASCULAR SURGERY ASSESSMENT & PLAN:   Left 5th toe gangrene, chronic osteomyelitis>>Afebrile, No leukocytosis. ABIs ordered, results pending. Aortogram with LLE run-off +/- intervention planned for tomorrow. Serum creatinine 0.91 today. NPO order placed.  History of DM2   SUBJECTIVE:   No complaints. Daughter at bedside.  PHYSICAL EXAM:   Vitals:   02/06/21 1400 02/06/21 1600 02/06/21 1652 02/06/21 2002  BP: (!) 146/79 (!) 149/90 (!) 163/92 (!) 118/56  Pulse:  81 98 75  Resp:  14 14 16   Temp:  98.2 F (36.8 C) 98.2 F (36.8 C) 97.7 F (36.5 C)  TempSrc:  Oral Oral Oral  SpO2:  100% 93% 100%  Weight:      Height:       General appearance: Awake, alert in no apparent distress Cardiac: Heart rate and rhythm are regular Respirations: Nonlabored Extremities: Both feet are warm with intact sensation and motor function.  Ischemic changes noted of left 5th toe. Foul odor present. Pulse/Doppler exam: Palpable right DP pulse. I cannot palpate left pedal pulses.  LABS:   Lab Results  Component Value Date   WBC 5.2 02/07/2021   HGB 11.6 (L) 02/07/2021   HCT 37.5 02/07/2021   MCV 87.6 02/07/2021   PLT 202 02/07/2021   Lab Results  Component Value Date   CREATININE 0.91 02/07/2021   Lab Results  Component Value Date   INR 1.0 02/06/2021   CBG (last 3)  Recent Labs    02/06/21 2005 02/07/21 0023 02/07/21 0625  GLUCAP 102* 93 80    PROBLEM LIST:    Active Problems:   Diabetes mellitus with peripheral vascular disease (HCC)   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 (HCC)   CURRENT MEDS:    amLODipine  5 mg Oral Daily   heparin  5,000 Units Subcutaneous Q8H   insulin aspart  0-15 Units Subcutaneous Q4H   Barbie Banner, PA-C  Office: [phone] 02/07/2021   I agree with the above.  I have seen and evaluated the patient.  We discussed proceeding with abdominal aortogram with runoff with possible intervention to the left leg.  All questions were  answered.  She will be n.p.o. after midnight.  Annamarie Major

## 2021-02-07 NOTE — Progress Notes (Signed)
HD#1 Subjective:  Overnight Events: None  Tiffany [name] is resting in bed comfortably.  She denies any acute concerns at this time.  We discussed the current plan for patient to have an arteriogram to assess blood flow prior to amputation.  Patient states that she has been a longtime smoker, we discussed the importance of this contributing to her lower extremity vascular disease.  Patient is interested in quitting and we discussed prescribing medications to assist her with quitting.  Objective:  Vital signs in last 24 hours: Vitals:   02/06/21 2002 02/07/21 0733 02/07/21 1011 02/07/21 1216  BP: (!) 118/56 (!) 139/98 (!) 145/90 139/78  Pulse: 75 67  75  Resp: 16 10  19   Temp: 97.7 F (36.5 C) 98.6 F (37 C)  98.6 F (37 C)  TempSrc: Oral Oral  Oral  SpO2: 100% 92%  94%  Weight:      Height:       Supplemental O2: Room Air SpO2: 94 % O2 Flow Rate (L/min): 0 L/min FiO2 (%): 21 %   Physical Exam:  Constitutional: well-appearing, in no acute distress HENT: normocephalic atraumatic Eyes: conjunctiva non-erythematous Neck: supple Cardiovascular: regular rate and rhythm, no m/r/g.  Difficult to palpate left lower extremity pulses.  2+ right dorsalis pedis, posterior tibialis Pulmonary/Chest: normal work of breathing on room air, lungs clear to auscultation bilaterally Abdominal: soft, non-tender, non-distended MSK: normal bulk and tone, gangrenous left fifth toe Neurological: alert & oriented x 3 Skin: warm and dry.  Erythema surrounding gangrenous left fifth toe Psych: Normal mood and thought process  Filed Weights   02/06/21 0700  Weight: 91 kg    Intake/Output Summary (Last 24 hours) at 02/07/2021 1253 Last data filed at 02/07/2021 0908 Gross per 24 hour  Intake 480 ml  Output 400 ml  Net 80 ml   Net IO Since Admission: 580 mL [02/07/21 1253]  Pertinent Labs: CBC Latest Ref Rng & Units 02/07/2021 02/05/2021 12/04/2020  WBC 4.0 - 10.5 K/uL 5.2 6.6 7.0  Hemoglobin  12.0 - 15.0 g/dL 11.6(L) 12.7 11.7(L)  Hematocrit 36.0 - 46.0 % 37.5 41.2 38.3  Platelets 150 - 400 K/uL 202 236 245    CMP Latest Ref Rng & Units 02/07/2021 02/05/2021 12/04/2020  Glucose 70 - 99 mg/dL 97 118(H) 109(H)  BUN 8 - 23 mg/dL 18 7(L) 17  Creatinine 0.44 - 1.00 mg/dL 0.91 0.85 0.88  Sodium 135 - 145 mmol/L 141 141 138  Potassium 3.5 - 5.1 mmol/L 3.5 3.2(L) 3.8  Chloride 98 - 111 mmol/L 106 103 105  CO2 22 - 32 mmol/L 27 28 25   Calcium 8.9 - 10.3 mg/dL 8.5(L) 9.0 8.5(L)  Total Protein 6.5 - 8.1 g/dL - 7.6 7.5  Total Bilirubin 0.3 - 1.2 mg/dL - 0.6 0.6  Alkaline Phos 38 - 126 U/L - 94 81  AST 15 - 41 U/L - 17 14(L)  ALT 0 - 44 U/L - 18 14    Imaging: VAS [place] ABI WITH/WO TBI  Result Date: 02/07/2021  LOWER EXTREMITY DOPPLER STUDY Patient Name:  Tiffany [name]  Date of Exam:   02/07/2021 Medical Rec #: [id number]         Accession #:    [id number] Date of Birth: Jun 07, 1951         Patient Gender: F Patient Age:   69Y Exam [facility]  [facility] Procedure:      VAS [place] ABI WITH/WO TBI Referring Phys: [id number] [name] --------------------------------------------------------------------------------  Indications: Gangrene, and peripheral artery disease. High Risk Factors: Diabetes.  Comparison Study: no prior study Performing Technologist: Sharion Dove RVS  Examination Guidelines: A complete evaluation includes at minimum, Doppler waveform signals and systolic blood pressure reading at the level of bilateral brachial, anterior tibial, and posterior tibial arteries, when vessel segments are accessible. Bilateral testing is considered an integral part of a complete examination. Photoelectric Plethysmograph (PPG) waveforms and toe systolic pressure readings are included as required and additional duplex testing as needed. Limited examinations for reoccurring indications may be performed as noted.  ABI Findings: +---------+------------------+-----+-----------+--------+ Right    Rt  Pressure (mmHg)IndexWaveform   Comment  +---------+------------------+-----+-----------+--------+ Brachial 153                    multiphasic         +---------+------------------+-----+-----------+--------+ PTA      144               0.94 multiphasic         +---------+------------------+-----+-----------+--------+ DP       157               1.03 multiphasic         +---------+------------------+-----+-----------+--------+ Great Toe129               0.84                     +---------+------------------+-----+-----------+--------+ +---------+------------------+-----+-----------+-----------+ Left     Lt Pressure (mmHg)IndexWaveform   Comment     +---------+------------------+-----+-----------+-----------+ Brachial                                   IV/bandages +---------+------------------+-----+-----------+-----------+ PTA      0                 0.00 absent                 +---------+------------------+-----+-----------+-----------+ DP       72                0.47 multiphasic            +---------+------------------+-----+-----------+-----------+ Great Toe49                0.32                        +---------+------------------+-----+-----------+-----------+ +-------+-----------+-----------+------------+------------+ ABI/TBIToday's ABIToday's TBIPrevious ABIPrevious TBI +-------+-----------+-----------+------------+------------+ Right  1.03       0.84                                +-------+-----------+-----------+------------+------------+ Left   0.47       0.32                                +-------+-----------+-----------+------------+------------+  Summary: Right: Resting right ankle-brachial index is within normal range. No evidence of significant right lower extremity arterial disease. The right toe-brachial index is normal. Left: Resting left ankle-brachial index indicates severe left lower extremity arterial disease. The left  toe-brachial index is abnormal.  *See table(s) above for measurements and observations.     Preliminary     Assessment/Plan:   Principal Problem:   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 (HCC) Active Problems:   Hypertension   Tobacco abuse  Diabetes mellitus with peripheral vascular disease ([place])   Gangrene ([place])  [name] is a 70 y.o. with a pertinent PMH of type 2 diabetes, tobacco use disorder, hypertension, who presented with pain of left fifth toe and admitted for dry gangrenous fifth left toe  Dry gangrene left fifth toe Patient with 37-month history of left fifth toe pain, now consistent with dry gangrene and bony involvement.  Suspect this is secondary to ischemic etiology.  Patient with multiple risk factors including hypertension, diabetes, tobacco use disorder. Orthopedics as well as vascular surgery consulted.  Plan for amputation after angiogram to assess peripheral blood flow.  We will optimize her medically for her lower extremity ischemia.  Does appear to have some erythema surrounding toe, will continue antibiotics at this time. -Appreciate vascular as well as orthopedic surgeries consultation -N.p.o. tonight, angiogram tomorrow with possible angioplasty -ABI-severe left lower extremity arterial disease.  Left toe brachial index abnormal.  Right ABI/TBI normal. -Plan for amputation left fifth toe -Continue vancomycin, ceftriaxone-day 2.   -DC Flagyl -Lipid panel pending -Start aspirin at discharge -Pain control-Tylenol 650 mg q6h PRN mild pain, oxycodone 5 mg q4h PRN severe pain -PT after amputation  Tobacco use disorder Patient with extensive history of tobacco use.  She notes that she has been able to cut down to 1 and half cigarettes per day.  She has quit in the past for 1 week.  Discussed with her that this is contributing to her arterial disease and that quitting can prevent further progression.  Patient is interested in quitting and will  we will plan to start Chantix on discharge. -Start Chantix at discharge  Hypertension History of hypertension, not on any medications.  Per chart review patient at one point was on amlodipine.  We will continue amlodipine 5 mg started on admission.  We will increase to 10 mg if not well controlled. -Amlodipine 5 mg daily  Type 2 diabetes Patient notes history of type 2 diabetes that has been untreated.  A1c during admission of 6.1%.  Does appear as though she was on metformin in the past but per patient has not been on this for some time.  Because her A1c is well controlled on diet alone, we will plan to repeat A1c in 1 year and continue to work on lifestyle modification. -Repeat A1c in 1 year -SSI during admission  Diet: Renal/carb modified.  Fluid restriction to 1 mL.  N.p.o. at midnight IVF: None,None VTE: Heparin Code: Full PT/OT recs: Pending, none. TOC recs: None at this time  Dispo: Pending agitation further evaluation of dry gangrenous infection of left fifth toe  [place] Internal Medicine Resident PGY-1 Pager [phone] Please contact the on call pager after 5 pm and on weekends at [phone].

## 2021-02-07 NOTE — Progress Notes (Signed)
VASCULAR LAB    ABIs have been performed.  See CV proc for preliminary results.   Ileanna Gemmill, RVT 02/07/2021, 12:02 PM

## 2021-02-07 NOTE — Progress Notes (Signed)
Pharmacy Antibiotic Note  Tiffany [name] is a 70 y.o. female admitted on 02/05/2021 with foot pain, found to have left 5th toe osteomyelitis with gangrene.  Pharmacy has been consulted for vancomycin dosing.  Patient is also on Rocephin and Flagyl.  Renal function stable, afebrile, WBC WNL Switch from vancomycin trough to AUC dosing  Plan: Change vanc to 1gm IV Q24H for AUC 515 using SCr 0.91 (goal AUC 400-550) CTX 2gm IV Q24H per MD Flagyl 500mg  IV Q8H per MD Monitor renal fxn, clinical progress, vanc levels as indicated  Height: 5\' 2"  (157.5 cm) Weight: 91 kg (200 lb 9.9 oz) IBW/kg (Calculated) : 50.1  Temp (24hrs), Avg:98.1 F (36.7 C), Min:97.7 F (36.5 C), Max:98.6 F (37 C)  Recent Labs  Lab 02/05/21 2125 02/06/21 0850 02/07/21 0358  WBC 6.6  --  5.2  CREATININE 0.85  --  0.91  LATICACIDVEN  --  1.3  --      Estimated Creatinine Clearance: 61.3 mL/min (by C-G formula based on SCr of 0.91 mg/dL).    Allergies  Allergen Reactions   Cymbalta [Duloxetine Hcl] Other (See Comments)    Sweating, bad dreams,   Vanc 6/15 >> Cefepime x1 6/15 CTX 6/16 >>  Flagyl 6/15 >>    6/15 BCx - NGTD  Tiffany [name] D. Mina Marble, PharmD, BCPS, [place] 02/07/2021, 8:39 AM

## 2021-02-08 ENCOUNTER — Encounter (HOSPITAL_COMMUNITY)
Admission: EM | Disposition: A | Discharge status: Skilled Nursing Facility | Payer: Self-pay | Source: Home / Self Care | Attending: Student in an Organized Health Care Education/Training Program

## 2021-02-08 ENCOUNTER — Encounter (HOSPITAL_COMMUNITY): Payer: Self-pay | Admitting: Anesthesiology

## 2021-02-08 DIAGNOSIS — I10 Essential (primary) hypertension: Secondary | ICD-10-CM

## 2021-02-08 DIAGNOSIS — Z720 Tobacco use: Secondary | ICD-10-CM | POA: Diagnosis not present

## 2021-02-08 DIAGNOSIS — Z01810 Encounter for preprocedural cardiovascular examination: Secondary | ICD-10-CM

## 2021-02-08 DIAGNOSIS — I739 Peripheral vascular disease, unspecified: Secondary | ICD-10-CM

## 2021-02-08 DIAGNOSIS — I70262 Atherosclerosis of native arteries of extremities with gangrene, left leg: Secondary | ICD-10-CM

## 2021-02-08 HISTORY — PX: ABDOMINAL AORTOGRAM W/LOWER EXTREMITY: CATH118223

## 2021-02-08 LAB — CBC
HCT: 34.9 % — ABNORMAL LOW (ref 36.0–46.0)
Hemoglobin: 11.2 g/dL — ABNORMAL LOW (ref 12.0–15.0)
MCH: 27.4 pg (ref 26.0–34.0)
MCHC: 32.1 g/dL (ref 30.0–36.0)
MCV: 85.3 fL (ref 80.0–100.0)
Platelets: 215 10*3/uL (ref 150–400)
RBC: 4.09 MIL/uL (ref 3.87–5.11)
RDW: 14.6 % (ref 11.5–15.5)
WBC: 4.5 10*3/uL (ref 4.0–10.5)
nRBC: 0 % (ref 0.0–0.2)

## 2021-02-08 LAB — GLUCOSE, CAPILLARY
Glucose-Capillary: 102 mg/dL — ABNORMAL HIGH (ref 70–99)
Glucose-Capillary: 128 mg/dL — ABNORMAL HIGH (ref 70–99)
Glucose-Capillary: 80 mg/dL (ref 70–99)
Glucose-Capillary: 89 mg/dL (ref 70–99)
Glucose-Capillary: 98 mg/dL (ref 70–99)
Glucose-Capillary: 98 mg/dL (ref 70–99)

## 2021-02-08 LAB — BASIC METABOLIC PANEL
Anion gap: 11 (ref 5–15)
BUN: 14 mg/dL (ref 8–23)
CO2: 26 mmol/L (ref 22–32)
Calcium: 8.6 mg/dL — ABNORMAL LOW (ref 8.9–10.3)
Chloride: 104 mmol/L (ref 98–111)
Creatinine, Ser: 0.76 mg/dL (ref 0.44–1.00)
GFR, Estimated: >60 mL/min (ref >60)
Glucose, Bld: 92 mg/dL (ref 70–99)
Potassium: 3.3 mmol/L — ABNORMAL LOW (ref 3.5–5.1)
Sodium: 141 mmol/L (ref 135–145)

## 2021-02-08 SURGERY — AMPUTATION, FOOT, RAY
Anesthesia: General | Laterality: Left

## 2021-02-08 SURGERY — ABDOMINAL AORTOGRAM W/LOWER EXTREMITY
Anesthesia: LOCAL

## 2021-02-08 MED ORDER — LABETALOL HCL 5 MG/ML IV SOLN
INTRAVENOUS | Status: DC | PRN
  Administered 2021-02-08: 10 mg via INTRAVENOUS

## 2021-02-08 MED ORDER — HYDRALAZINE HCL 20 MG/ML IJ SOLN
INTRAMUSCULAR | Status: AC
  Filled 2021-02-08: qty 1

## 2021-02-08 MED ORDER — SODIUM CHLORIDE 0.9 % IV SOLN: INTRAVENOUS | Status: DC

## 2021-02-08 MED ORDER — IODIXANOL 320 MG/ML IV SOLN
INTRAVENOUS | Status: DC | PRN
  Administered 2021-02-08: 142 mL via INTRA_ARTERIAL

## 2021-02-08 MED ORDER — MORPHINE SULFATE (PF) 2 MG/ML IV SOLN
2.0000 mg | INTRAVENOUS | Status: DC | PRN
Start: 2021-02-08 — End: 2021-02-09
  Administered 2021-02-09: 2 mg via INTRAVENOUS
  Filled 2021-02-08: qty 1

## 2021-02-08 MED ORDER — HYDRALAZINE HCL 20 MG/ML IJ SOLN
INTRAMUSCULAR | Status: DC | PRN
  Administered 2021-02-08: 10 mg via INTRAVENOUS

## 2021-02-08 MED ORDER — HYDRALAZINE HCL 20 MG/ML IJ SOLN
5.0000 mg | INTRAMUSCULAR | Status: AC | PRN
  Administered 2021-02-08 – 2021-02-10 (×2): 5 mg via INTRAVENOUS
  Filled 2021-02-08: qty 1

## 2021-02-08 MED ORDER — SODIUM CHLORIDE 0.9 % IV SOLN: INTRAVENOUS | Status: AC

## 2021-02-08 MED ORDER — POTASSIUM CHLORIDE CRYS ER 20 MEQ PO TBCR
40.0000 meq | EXTENDED_RELEASE_TABLET | Freq: Once | ORAL | Status: AC
  Administered 2021-02-08: 40 meq via ORAL
  Filled 2021-02-08: qty 2

## 2021-02-08 MED ORDER — HEPARIN (PORCINE) IN NACL 1000-0.9 UT/500ML-% IV SOLN
INTRAVENOUS | Status: DC | PRN
  Administered 2021-02-08 (×2): 500 mL

## 2021-02-08 MED ORDER — MORPHINE SULFATE (PF) 2 MG/ML IV SOLN
INTRAVENOUS | Status: AC
  Filled 2021-02-08: qty 1

## 2021-02-08 MED ORDER — MORPHINE SULFATE (PF) 2 MG/ML IV SOLN
2.0000 mg | INTRAVENOUS | Status: DC | PRN
Start: 2021-02-08 — End: 2021-02-08
  Administered 2021-02-08: 2 mg via INTRAVENOUS

## 2021-02-08 MED ORDER — POTASSIUM CHLORIDE 10 MEQ/100ML IV SOLN: 10.0000 meq | INTRAVENOUS | Status: AC

## 2021-02-08 MED ORDER — HEPARIN (PORCINE) IN NACL 1000-0.9 UT/500ML-% IV SOLN
INTRAVENOUS | Status: AC
  Filled 2021-02-08: qty 1000

## 2021-02-08 MED ORDER — OXYCODONE HCL 5 MG PO TABS
5.0000 mg | ORAL_TABLET | ORAL | Status: DC | PRN
  Administered 2021-02-09: 5 mg via ORAL
  Filled 2021-02-08: qty 1

## 2021-02-08 MED ORDER — LIDOCAINE HCL (PF) 1 % IJ SOLN
INTRAMUSCULAR | Status: DC | PRN
  Administered 2021-02-08: 15 mL via INTRADERMAL

## 2021-02-08 MED ORDER — ATORVASTATIN CALCIUM 40 MG PO TABS
40.0000 mg | ORAL_TABLET | Freq: Every day | ORAL | Status: DC
  Administered 2021-02-08 – 2021-02-19 (×12): 40 mg via ORAL
  Filled 2021-02-08 (×12): qty 1

## 2021-02-08 MED ORDER — ASPIRIN EC 81 MG PO TBEC
81.0000 mg | DELAYED_RELEASE_TABLET | Freq: Every day | ORAL | Status: DC
  Administered 2021-02-08 – 2021-02-19 (×12): 81 mg via ORAL
  Filled 2021-02-08 (×12): qty 1

## 2021-02-08 MED ORDER — LIDOCAINE HCL (PF) 1 % IJ SOLN
INTRAMUSCULAR | Status: AC
  Filled 2021-02-08: qty 30

## 2021-02-08 MED ORDER — SODIUM CHLORIDE 0.9% FLUSH
3.0000 mL | Freq: Two times a day (BID) | INTRAVENOUS | Status: DC
  Administered 2021-02-08 – 2021-02-12 (×7): 3 mL via INTRAVENOUS

## 2021-02-08 MED ORDER — LABETALOL HCL 5 MG/ML IV SOLN
10.0000 mg | INTRAVENOUS | Status: DC | PRN
Start: 2021-02-08 — End: 2021-02-11
  Administered 2021-02-11: 10 mg via INTRAVENOUS
  Filled 2021-02-08: qty 4

## 2021-02-08 MED ORDER — SODIUM CHLORIDE 0.9 % IV SOLN
250.0000 mL | INTRAVENOUS | Status: DC | PRN
  Administered 2021-02-09: 250 mL via INTRAVENOUS

## 2021-02-08 MED ORDER — SODIUM CHLORIDE 0.9% FLUSH: 3.0000 mL | INTRAVENOUS | Status: DC | PRN

## 2021-02-08 MED ORDER — LABETALOL HCL 5 MG/ML IV SOLN
INTRAVENOUS | Status: AC
  Filled 2021-02-08: qty 4

## 2021-02-08 SURGICAL SUPPLY — 11 items
CATH ANGIO 5F PIGTAIL 65CM (CATHETERS) ×1 IMPLANT
CATH CROSS OVER TEMPO 5F (CATHETERS) ×1 IMPLANT
CATH STRAIGHT 5FR 65CM (CATHETERS) ×1 IMPLANT
KIT PV (KITS) ×2 IMPLANT
MAT PREVALON FULL STRYKER (MISCELLANEOUS) ×1 IMPLANT
SHEATH PINNACLE 5F 10CM (SHEATH) ×1 IMPLANT
SHEATH PROBE COVER 6X72 (BAG) ×1 IMPLANT
SYR MEDRAD MARK V 150ML (SYRINGE) ×1 IMPLANT
TRANSDUCER W/STOPCOCK (MISCELLANEOUS) ×2 IMPLANT
TRAY PV CATH (CUSTOM PROCEDURE TRAY) ×2 IMPLANT
WIRE HITORQ VERSACORE ST 145CM (WIRE) ×1 IMPLANT

## 2021-02-08 NOTE — Progress Notes (Signed)
Pt admitted to [facility] from [facility] Lab.  Pt placed on telemetry and CCMD notified.  CHB bath completed.  Vitals taken and within normal range with exception of BP: 155/81 (104).  Left femoral site currently a level 0 with no signs of hematoma or bleeding.  Pt is currently comfortable and not in pain.

## 2021-02-08 NOTE — Interval H&P Note (Signed)
History and Physical Interval Note:  02/08/2021 10:53 AM  Tiffany [name]  has presented today for surgery, with the diagnosis of osteomyelitis.  The various methods of treatment have been discussed with the patient and family. After consideration of risks, benefits and other options for treatment, the patient has consented to  Procedure(s): ABDOMINAL AORTOGRAM W/LOWER EXTREMITY (N/A) as a surgical intervention.  The patient's history has been reviewed, patient examined, no change in status, stable for surgery.  I have reviewed the patient's chart and labs.  Questions were answered to the patient's satisfaction.     Ruta Hinds

## 2021-02-08 NOTE — Op Note (Signed)
Procedure: Ultrasound right groin, abdominal aortogram with bilateral lower extremity runoff  Preoperative diagnosis: Gangrene left foot  Postoperative diagnosis: Same  Anesthesia: Local  Operative findings: 1.  Flush occlusion left superficial femoral artery, 70% stenosis with calcific plaque left popliteal artery, proximal disease of posterior tibial anterior tibial arteries with two-vessel outflow to the left foot via the anterior tibial and posterior tibial artery  Operative details: After obtaining informed consent, the patient was taken the [place] lab.  The patient was placed in supine position on the angio table.  Both groins were prepped and draped in usual sterile fashion.  Ultrasound was used to identify the right common femoral artery and femoral bifurcation.  Local anesthesia was infiltrated over the right common femoral artery.  An introducer needle was used to cannulate the right common femoral artery under ultrasound guidance.  035 versa core wire was advanced and the abdominal aorta under fluoroscopic guidance.  A 5 French sheath was placed over the guidewire in the right common femoral artery.  This was thoroughly flushed with heparinized saline.  5 French pigtail catheter was then advanced over the guidewire to the abdominal aorta and abdominal aortogram was obtained in AP projection.  Right renal arteries are patent.  Infrarenal abdominal aorta is patent.  Left and right common external and internal iliac arteries are all patent.  At this point bilateral lower extremity runoff views were obtained through the pigtail catheter.  In the right lower extremity the right common femoral profunda femoris and superficial femoral arteries are patent.  There is irregular atherosclerotic plaque in the right popliteal artery.  There is also irregular atherosclerotic plaque in the tibial vessels of the right leg but these are intact.  In the left lower extremity the left common femoral artery is  patent.  The left profunda femoris is patent.  The left superficial femoral artery has a flush occlusion at its origin.  The above-knee popliteal artery does reconstitute but this is a very diseased vessel with severe calcific plaque and a 70% stenosis.  There is a less narrowing in the below-knee popliteal artery but again this is severely calcified.  There is two-vessel runoff via mildly diseased anterior tibial and posterior tibial artery to the left foot.  At this point the pigtail catheter was swapped out for a 5 [place] crossover catheter.  This was used to selectively catheterize the left common iliac artery and the guidewire advanced down into the distal left common femoral artery.  This was then replaced with a 5 French straight catheter.  Additional views of the left leg were obtained with views behind a prosthetic knee and further clarify the tibial vessels from the above images.  At this point the 5 French straight catheter was removed the 5 French sheath was left in place to be pulled in the holding area.  The patient tolerated procedure well and there were no complications.  Operative management: Patient's images will be reviewed by Dr. Trula Slade most likely patient will require lower extremity bypass grafting prior to her toe amputation.  Ruta Hinds, MD [facility] Office: [phone]

## 2021-02-08 NOTE — Progress Notes (Signed)
HD#2 Subjective:  Overnight Events: None  Tiffany [name] was evaluated at bedside this afternoon, post-aortogram. She is feeling overwhelmed at this time and wishes her daughter was here. She states she was explained the results but cannot recall them. We discussed her aortogram results and she expressed understanding, including regarding next steps for bypass prior to amputation. She denies any acute complaints at this time.   Objective:  Vital signs in last 24 hours: Vitals:   02/08/21 1330 02/08/21 1335 02/08/21 1340 02/08/21 1411  BP: (!) 168/94 (!) 169/83 (!) 145/86 (!) 155/81  Pulse: 71 68 70 71  Resp: 14 13 13 13   Temp:    99.1 F (37.3 C)  TempSrc:    Oral  SpO2: 96% 96% 94% 98%  Weight:      Height:       Supplemental O2: Room Air SpO2: 98 % O2 Flow Rate (L/min): 2 L/min FiO2 (%): 21 %  Physical Exam:  Constitutional: well-appearing, in no acute distress HENT: normocephalic atraumatic Pulmonary/Chest: normal work of breathing on room air MSK: normal bulk and tone, gangrenous left fifth toe Neurological: alert & oriented x 3 Psych: Teary  Filed Weights   02/06/21 0700  Weight: 91 kg    Intake/Output Summary (Last 24 hours) at 02/08/2021 1509 Last data filed at 02/07/2021 1616 Gross per 24 hour  Intake 120.78 ml  Output --  Net 120.78 ml   Net IO Since Admission: 700.78 mL [02/08/21 1509]  Pertinent Labs: CBC Latest Ref Rng & Units 02/08/2021 02/07/2021 02/05/2021  WBC 4.0 - 10.5 K/uL 4.5 5.2 6.6  Hemoglobin 12.0 - 15.0 g/dL 11.2(L) 11.6(L) 12.7  Hematocrit 36.0 - 46.0 % 34.9(L) 37.5 41.2  Platelets 150 - 400 K/uL 215 202 236    CMP Latest Ref Rng & Units 02/08/2021 02/07/2021 02/05/2021  Glucose 70 - 99 mg/dL 92 97 118(H)  BUN 8 - 23 mg/dL 14 18 7(L)  Creatinine 0.44 - 1.00 mg/dL 0.76 0.91 0.85  Sodium 135 - 145 mmol/L 141 141 141  Potassium 3.5 - 5.1 mmol/L 3.3(L) 3.5 3.2(L)  Chloride 98 - 111 mmol/L 104 106 103  CO2 22 - 32 mmol/L 26 27 28   Calcium  8.9 - 10.3 mg/dL 8.6(L) 8.5(L) 9.0  Total Protein 6.5 - 8.1 g/dL - - 7.6  Total Bilirubin 0.3 - 1.2 mg/dL - - 0.6  Alkaline Phos 38 - 126 U/L - - 94  AST 15 - 41 U/L - - 17  ALT 0 - 44 U/L - - 18    Imaging: PERIPHERAL VASCULAR CATHETERIZATION  Result Date: 02/08/2021 Procedure: Ultrasound right groin, abdominal aortogram with bilateral lower extremity runoff Preoperative diagnosis: Gangrene left foot Postoperative diagnosis: Same Anesthesia: Local Operative findings: 1.  Flush occlusion left superficial femoral artery, 70% stenosis with calcific plaque left popliteal artery, proximal disease of posterior tibial anterior tibial arteries with two-vessel outflow to the left foot via the anterior tibial and posterior tibial artery Operative details: After obtaining informed consent, the patient was taken the [place] lab.  The patient was placed in supine position on the angio table.  Both groins were prepped and draped in usual sterile fashion.  Ultrasound was used to identify the right common femoral artery and femoral bifurcation.  Local anesthesia was infiltrated over the right common femoral artery.  An introducer needle was used to cannulate the right common femoral artery under ultrasound guidance.  035 versa core wire was advanced and the abdominal aorta under fluoroscopic guidance.  A 5 French sheath was placed over the guidewire in the right common femoral artery.  This was thoroughly flushed with heparinized saline.  5 French pigtail catheter was then advanced over the guidewire to the abdominal aorta and abdominal aortogram was obtained in AP projection.  Right renal arteries are patent.  Infrarenal abdominal aorta is patent.  Left and right common external and internal iliac arteries are all patent. At this point bilateral lower extremity runoff views were obtained through the pigtail catheter. In the right lower extremity the right common femoral profunda femoris and superficial femoral arteries are  patent.  There is irregular atherosclerotic plaque in the right popliteal artery.  There is also irregular atherosclerotic plaque in the tibial vessels of the right leg but these are intact. In the left lower extremity the left common femoral artery is patent.  The left profunda femoris is patent.  The left superficial femoral artery has a flush occlusion at its origin.  The above-knee popliteal artery does reconstitute but this is a very diseased vessel with severe calcific plaque and a 70% stenosis.  There is a less narrowing in the below-knee popliteal artery but again this is severely calcified.  There is two-vessel runoff via mildly diseased anterior tibial and posterior tibial artery to the left foot. At this point the pigtail catheter was swapped out for a 5 [place] crossover catheter.  This was used to selectively catheterize the left common iliac artery and the guidewire advanced down into the distal left common femoral artery.  This was then replaced with a 5 French straight catheter.  Additional views of the left leg were obtained with views behind a prosthetic knee and further clarify the tibial vessels from the above images. At this point the 5 French straight catheter was removed the 5 French sheath was left in place to be pulled in the holding area.  The patient tolerated procedure well and there were no complications. Operative management: Patient's images will be reviewed by Dr. Trula Slade most likely patient will require lower extremity bypass grafting prior to her toe amputation. Ruta Hinds, MD [facility] Office: [phone]    Assessment/Plan:   Principal Problem:   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 ([place]) Active Problems:   Hypertension   Tobacco abuse   Diabetes mellitus with peripheral vascular disease ([place])   Gangrene ([place])  [name] is a 70 y.o. with a pertinent PMH of type 2 diabetes, tobacco use disorder,  hypertension, who presented with pain of left fifth toe and admitted for dry gangrenous fifth left toe  Dry gangrene, left fifth toe Aortogram this afternoon with evidence of severe vessel disease involving the left popliteal artery. Per vascular note, plan is for bypass surgery prior to amputation. On examination, left toe does not appear to be acutely infected will discontinue antibiotic therapy.  -Appreciate vascular as well as orthopedic surgeries consultation -Pain control - Tylenol 650 mg q6h PRN for mild pain, oxycodone 5 mg q4h PRN for moderate pain, morphine 2 mg q4h PRN for severe pain -Discontinue antibiotics Vancomycin and Ceftriaxone  -Start high intensity statin, Atorvastatin 40 mg daily -Will touch base with vascular surgery regarding anti-coagulation and aspirin, as it may be affected by anticipated bypass surgery date.   Hypertension Patient required Hydralazine and Labetalol prior to cath, will likely need more aggressive blood pressure control prior to discharge.  -For now, continue Amlodipine 5 mg daily  Type 2 diabetes Patient notes history  of type 2 diabetes that has been untreated.  A1c during admission of 6.1%. CBGs are within goal during hospitalization thus far.  -Continue SSI  Tobacco use disorder Patient with extensive history of tobacco use. Patient is interested in quitting and will plan to start Chantix on discharge.  Diet: Renal/carb modified.  IVF: None,None VTE: Heparin Code: Full PT/OT recs: Pending  Dispo: Pending further evaluation of dry gangrenous infection of left fifth toe  Dr. Jose Persia Internal Medicine PGY-2  Pager: [phone] After 5pm on weekdays and 1pm on weekends: On Call pager [phone]  02/08/2021, 3:21 PM

## 2021-02-08 NOTE — Consult Note (Signed)
Cardiology Consultation:   Patient ID: Tiffany [name] MRN: [id number]; DOB: 01-15-1951  Admit date: 02/05/2021 Date of Consult: 02/08/2021  PCP:  Mardi Mainland, FNP   [facility] Providers Cardiologist:  Johnney Ou here to update MD or APP on Care Team, Refresh:1}     Patient Profile:   Tiffany [name] is a 70 y.o. female with a hx of type II diabetes, tobacco use who is being seen 02/08/2021 for the evaluation of preoperative cardiovascular evaluation at the request of Dr. Evette Doffing.  History of Present Illness:   We are consulted today for preoperative cardiovascular risk stratification. History reviewed with patient and confirmed in the chart.  Of note, patient is very tearful during interview and feels overwhelmed with the recent information that she has PAD and will need surgery. See plan, but difficult for her to make any decisions today.  Planned surgery: left lower extremity bypass, date TBA, vascular surgery  Pertinent past cardiac history: none that she is aware of Prior cardiac workup: none History of valve disease: none that she is aware of History of CAD/PAD/CVA/TIA: new diagnosis of PAD this admission History of heart failure: none that she is aware of History of arrhythmia: none that she is aware of On anticoagulation: no History of hypertension: yes, has been elevated History of diabetes: yes, last A1c 6.1, not on long term insulin History of CKD: none that she is aware of History of OSA: none that she is aware of History of anesthesia complications: none Current symptoms: Denies chest pain, shortness of breath at rest or with normal exertion. No PND, orthopnea, LE edema or unexpected weight gain. No syncope or palpitations. 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 No stairs in her house, doesn't walk around her neighborhood or around the  store. She does walk around the house without chest pain or shortness of breath.   Past Medical History:  Diagnosis Date   Arthritis    osteoarthritis   GERD (gastroesophageal reflux disease)    Hx of bronchitis    Hypertension     Past Surgical History:  Procedure Laterality Date   KNEE SURGERY     arthroscopic right   toe nail removal     TOTAL KNEE ARTHROPLASTY Left 05/31/2013   Procedure: LEFT TOTAL KNEE ARTHROPLASTY;  Surgeon: Mauri Pole, MD;  Location: [facility];  Service: Orthopedics;  Laterality: Left;     Home Medications:  Prior to Admission medications   Medication Sig Start Date End Date Taking? Authorizing Provider  ALPRAZolam (XANAX) 0.5 MG tablet TAKE ONE-HALF TO ONE TABLET BY MOUTH THREE TIMES DAILY AS NEEDED FOR ANXIETY ATTACKS Patient not taking: No sig reported 03/06/16   Lauree Chandler, NP  amLODipine (NORVASC) 5 MG tablet Take 1 tablet (5 mg total) by mouth daily. Patient not taking: Reported on 02/06/2021 11/22/20   Quintella Reichert, MD  baclofen (LIORESAL) 10 MG tablet TAKE ONE-HALF TABLET BY MOUTH EVERY 8 HOURS AS NEEDED FOR MUSCLE SPASM (FOR KNEE/SPASM) Patient not taking: Reported on 02/06/2021 04/11/16   Lauree Chandler, NP  cloNIDine (CATAPRES) 0.1 MG tablet Take 1 tablet (0.1 mg total) by mouth daily. Patient not taking: Reported on 02/06/2021 03/06/16   Lauree Chandler, NP  HYDROcodone-acetaminophen (NORCO) 10-325 MG tablet Take 1 tablet by mouth every 6 (six) hours as needed. Prescription to be obtained from orthopedic office. Patient not taking:  No sig reported 03/06/16   Lauree Chandler, NP  HYDROcodone-acetaminophen (NORCO) 10-325 MG tablet Take 1 tablet by mouth every 6 (six) hours as needed. Prescription to be obtained from orthopedic office. Patient not taking: No sig reported 03/06/16   Lauree Chandler, NP  ibuprofen (ADVIL) 600 MG tablet Take 1 tablet (600 mg total) by mouth every 6 (six) hours as needed. Patient not taking: Reported  on 02/06/2021 12/04/20   Charlann Lange, PA-C  lisinopril (PRINIVIL,ZESTRIL) 10 MG tablet Take 1 tablet (10 mg total) by mouth daily. Patient not taking: No sig reported 03/06/16   Lauree Chandler, NP  oseltamivir (TAMIFLU) 75 MG capsule Take 1 capsule (75 mg total) by mouth daily. Patient not taking: No sig reported 10/23/15   Lauree Chandler, NP  oxyCODONE-acetaminophen (PERCOCET) 7.5-325 MG tablet TK 1 T PO QID PRN PAIN Patient not taking: Reported on 02/06/2021 12/14/17   [provider]  pantoprazole (PROTONIX) 40 MG tablet Take 1 tablet (40 mg total) by mouth daily. Patient not taking: No sig reported 03/06/16   Lauree Chandler, NP    Inpatient Medications: Scheduled Meds:  amLODipine  5 mg Oral Daily   aspirin EC  81 mg Oral Daily   atorvastatin  40 mg Oral Daily   heparin  5,000 Units Subcutaneous Q8H   insulin aspart  0-15 Units Subcutaneous Q4H   sodium chloride flush  3 mL Intravenous Q12H   Continuous Infusions:  sodium chloride 100 mL/hr at 02/08/21 1433   sodium chloride     PRN Meds: sodium chloride, acetaminophen **OR** acetaminophen, hydrALAZINE, labetalol, morphine injection, ondansetron **OR** ondansetron (ZOFRAN) IV, oxyCODONE, sodium chloride flush  Allergies:    Allergies  Allergen Reactions   Cymbalta [Duloxetine Hcl] Other (See Comments)    Sweating, bad dreams,    Social History:   Social History   Socioeconomic History   Marital status: Single    Spouse name: Not on file   Number of children: 2   Years of education: Not on file   Highest education level: Not on file  Occupational History    Employer: NOT EMPLOYED  Tobacco Use   Smoking status: Every Day    Packs/day: 0.25    Years: 20.00    Pack years: 5.00    Types: Cigarettes   Smokeless tobacco: Never   Tobacco comments:    5 cig daily   Substance and Sexual Activity   Alcohol use: No    Alcohol/week: 0.0 standard drinks   Drug use: No   Sexual activity: Not Currently   Other Topics Concern   Not on file  Social History Narrative   Daughter, son and grandson live with her.    Social Determinants of Health   Financial Resource Strain: Not on file  Food Insecurity: Not on file  Transportation Needs: Not on file  Physical Activity: Not on file  Stress: Not on file  Social Connections: Not on file  Intimate Partner Violence: Not on file    Family History:    Family History  Problem Relation Age of Onset   Heart disease Father        Died of CHF age 11   Heart disease Mother        Died of CHF age 90   Cancer Brother        Cancer   Heart failure Brother        Died of CHF    High blood pressure Son  Colon cancer Neg Hx      ROS:  Please see the history of present illness.  Constitutional: Negative for chills, fever, night sweats, unintentional weight loss  HENT: Negative for ear pain and hearing loss.   Eyes: Negative for loss of vision and eye pain.  Respiratory: Negative for cough, sputum, wheezing.   Cardiovascular: See HPI. Gastrointestinal: Negative for abdominal pain, melena, and hematochezia.  Genitourinary: Negative for dysuria and hematuria.  Musculoskeletal: Negative for falls and myalgias.  Skin: Negative for itching and rash.  Neurological: Negative for focal weakness, focal sensory changes and loss of consciousness.  Endo/Heme/Allergies: Does not bruise/bleed easily.   All other ROS reviewed and negative.     Physical Exam/Data:   Vitals:   02/08/21 1340 02/08/21 1411 02/08/21 1631 02/08/21 1800  BP: (!) 145/86 (!) 155/81 138/81 (!) 158/82  Pulse: 70 71 76 74  Resp: 13 13 15 16   Temp:  99.1 F (37.3 C) 99.2 F (37.3 C)   TempSrc:  Oral Oral   SpO2: 94% 98% 99% 98%  Weight:      Height:       No intake or output data in the 24 hours ending 02/08/21 1813  Last 3 Weights 02/06/2021 12/03/2020 11/22/2020  Weight (lbs) 200 lb 9.9 oz 225 lb 225 lb  Weight (kg) 91 kg 102.059 kg 102.059 kg     Body mass index is  36.69 kg/m.  General:  Well nourished, well developed, tearful on interview HEENT: normal Lymph: no adenopathy Neck: no JVD apparent but difficult body habitus Endocrine:  No thryomegaly Vascular: No carotid bruits; RA pulses 2+ bilaterally. Cannot palpate distal LLE pulses well, normal on RLE. Cardiac:  normal S1, S2; RRR; no murmur  Lungs:  clear to auscultation bilaterally, no wheezing, rhonchi or rales  Abd: soft, nontender, no hepatomegaly  Ext: no edema Musculoskeletal:  No deformities, moves all limbs independently. Skin: warm and dry  Neuro:  CNs 2-12 intact, no focal abnormalities noted Psych:  Normal affect   EKG:  The EKG was personally reviewed and demonstrates:  SR, difficult baseline, but appears to have inferior ST depressions, mild Telemetry:  Telemetry was personally reviewed and demonstrates:  NSR  Relevant CV Studies: No cardiac studies Vascular angio reviewed from today  Laboratory Data:  High Sensitivity Troponin:  No results for input(s): TROPONINIHS in the last 720 hours.   Chemistry Recent Labs  Lab 02/05/21 2125 02/07/21 0358 02/08/21 0301  NA 141 141 141  K 3.2* 3.5 3.3*  CL 103 106 104  CO2 28 27 26   GLUCOSE 118* 97 92  BUN 7* 18 14  CREATININE 0.85 0.91 0.76  CALCIUM 9.0 8.5* 8.6*  GFRNONAA >60 >60 >60  ANIONGAP 10 8 11     Recent Labs  Lab 02/05/21 2125  PROT 7.6  ALBUMIN 3.6  AST 17  ALT 18  ALKPHOS 94  BILITOT 0.6   Hematology Recent Labs  Lab 02/05/21 2125 02/07/21 0358 02/08/21 0301  WBC 6.6 5.2 4.5  RBC 4.74 4.28 4.09  HGB 12.7 11.6* 11.2*  HCT 41.2 37.5 34.9*  MCV 86.9 87.6 85.3  MCH 26.8 27.1 27.4  MCHC 30.8 30.9 32.1  RDW 14.4 14.7 14.6  PLT 236 202 215   BNPNo results for input(s): BNP, PROBNP in the last 168 hours.  DDimer No results for input(s): DDIMER in the last 168 hours.   Radiology/Studies:  PERIPHERAL VASCULAR CATHETERIZATION  Result Date: 02/08/2021 Procedure: Ultrasound right groin, abdominal  aortogram with  bilateral lower extremity runoff Preoperative diagnosis: Gangrene left foot Postoperative diagnosis: Same Anesthesia: Local Operative findings: 1.  Flush occlusion left superficial femoral artery, 70% stenosis with calcific plaque left popliteal artery, proximal disease of posterior tibial anterior tibial arteries with two-vessel outflow to the left foot via the anterior tibial and posterior tibial artery Operative details: After obtaining informed consent, the patient was taken the [place] lab.  The patient was placed in supine position on the angio table.  Both groins were prepped and draped in usual sterile fashion.  Ultrasound was used to identify the right common femoral artery and femoral bifurcation.  Local anesthesia was infiltrated over the right common femoral artery.  An introducer needle was used to cannulate the right common femoral artery under ultrasound guidance.  035 versa core wire was advanced and the abdominal aorta under fluoroscopic guidance.  A 5 French sheath was placed over the guidewire in the right common femoral artery.  This was thoroughly flushed with heparinized saline.  5 French pigtail catheter was then advanced over the guidewire to the abdominal aorta and abdominal aortogram was obtained in AP projection.  Right renal arteries are patent.  Infrarenal abdominal aorta is patent.  Left and right common external and internal iliac arteries are all patent. At this point bilateral lower extremity runoff views were obtained through the pigtail catheter. In the right lower extremity the right common femoral profunda femoris and superficial femoral arteries are patent.  There is irregular atherosclerotic plaque in the right popliteal artery.  There is also irregular atherosclerotic plaque in the tibial vessels of the right leg but these are intact. In the left lower extremity the left common femoral artery is patent.  The left profunda femoris is patent.  The left superficial  femoral artery has a flush occlusion at its origin.  The above-knee popliteal artery does reconstitute but this is a very diseased vessel with severe calcific plaque and a 70% stenosis.  There is a less narrowing in the below-knee popliteal artery but again this is severely calcified.  There is two-vessel runoff via mildly diseased anterior tibial and posterior tibial artery to the left foot. At this point the pigtail catheter was swapped out for a 5 [place] crossover catheter.  This was used to selectively catheterize the left common iliac artery and the guidewire advanced down into the distal left common femoral artery.  This was then replaced with a 5 French straight catheter.  Additional views of the left leg were obtained with views behind a prosthetic knee and further clarify the tibial vessels from the above images. At this point the 5 French straight catheter was removed the 5 French sheath was left in place to be pulled in the holding area.  The patient tolerated procedure well and there were no complications. Operative management: Patient's images will be reviewed by Dr. Trula Slade most likely patient will require lower extremity bypass grafting prior to her toe amputation. Ruta Hinds, MD [facility] Office: [phone]   DG Foot Complete Left  Result Date: 02/05/2021 CLINICAL DATA:  Black 5th toe EXAM: LEFT FOOT - COMPLETE 3+ VIEW COMPARISON:  None. FINDINGS: Areas of lucency noted in the distal aspect of the left 5th toe proximal phalanx concerning for osteomyelitis. No fracture. Early degenerative changes with hallux valgus deformity at the 1st MTP joint. Soft tissues are intact. No soft tissue gas. IMPRESSION: Lucency/bone destruction in the distal aspect of the left 5th toe proximal phalanx concerning for osteomyelitis. Electronically  Signed   By: Rolm Baptise M.D.   On: 02/05/2021 21:59   VAS [place] ABI WITH/WO TBI  Result Date: 02/07/2021  LOWER EXTREMITY DOPPLER  STUDY Patient Name:  JULEA HUTTO  Date of Exam:   02/07/2021 Medical Rec #: [id number]         Accession #:    [id number] Date of Birth: 1951-02-01         Patient Gender: F Patient Age:   69Y Exam Location:  [facility] Procedure:      VAS [place] ABI WITH/WO TBI Referring Phys: [id number] [name] --------------------------------------------------------------------------------  Indications: Gangrene, and peripheral artery disease. High Risk Factors: Diabetes.  Comparison Study: no prior study Performing Technologist: Sharion Dove RVS  Examination Guidelines: A complete evaluation includes at minimum, Doppler waveform signals and systolic blood pressure reading at the level of bilateral brachial, anterior tibial, and posterior tibial arteries, when vessel segments are accessible. Bilateral testing is considered an integral part of a complete examination. Photoelectric Plethysmograph (PPG) waveforms and toe systolic pressure readings are included as required and additional duplex testing as needed. Limited examinations for reoccurring indications may be performed as noted.  ABI Findings: +---------+------------------+-----+-----------+--------+ Right    Rt Pressure (mmHg)IndexWaveform   Comment  +---------+------------------+-----+-----------+--------+ Brachial 153                    multiphasic         +---------+------------------+-----+-----------+--------+ PTA      144               0.94 multiphasic         +---------+------------------+-----+-----------+--------+ DP       157               1.03 multiphasic         +---------+------------------+-----+-----------+--------+ Great Toe129               0.84                     +---------+------------------+-----+-----------+--------+ +---------+------------------+-----+-----------+-----------+ Left     Lt Pressure (mmHg)IndexWaveform   Comment     +---------+------------------+-----+-----------+-----------+ Brachial                                    IV/bandages +---------+------------------+-----+-----------+-----------+ PTA      0                 0.00 absent                 +---------+------------------+-----+-----------+-----------+ DP       72                0.47 multiphasic            +---------+------------------+-----+-----------+-----------+ Great Toe49                0.32                        +---------+------------------+-----+-----------+-----------+ +-------+-----------+-----------+------------+------------+ ABI/TBIToday's ABIToday's TBIPrevious ABIPrevious TBI +-------+-----------+-----------+------------+------------+ Right  1.03       0.84                                +-------+-----------+-----------+------------+------------+ Left   0.47       0.32                                +-------+-----------+-----------+------------+------------+  Summary: Right: Resting right ankle-brachial index is within normal range. No evidence of significant right lower extremity arterial disease. The right toe-brachial index is normal. Left: Resting left ankle-brachial index indicates severe left lower extremity arterial disease. The left toe-brachial index is abnormal.  *See table(s) above for measurements and observations.  Electronically signed by Jamelle Haring on 02/07/2021 at 2:15:46 PM.    Final      Assessment and Plan:   Preoperative cardiovascular risk assessment: Planned for lower extremity bypass grafting prior to toe amputation. Has dry gangrene of left fifth toe  Based on available date, patient's RCRI score = 0, which carries a 3.9% 30-day risk of death, MI, or cardiac arrest.  The patient is not currently having active cardiac symptoms, but she is minimally active at baseline. She also has nonspecific inferior ST depressions on ECG. We discussed stress testing today. She is very tearful and overwhelmed, cannot commit to this today. She wants to talk to her daughter.  Unclear when surgery is planned, but unlikely to be over the weekend. Based on her discussion with her daughter, would re-discuss lexiscan nuclear stress test prior to surgery. She does not have known disease, and it is not a high risk surgery, so if she declines I think she could still proceed to surgery with close monitoring.  PAD: -planned for lower extremity bypass graft and toe amputation as above -management per vascular surgery -agree with atorvastatin -given PAD and type II diabetes, would consider GLP1RA. While the data for amputation with SGLT2i is inconclusive, GLP1RA is a good alternative with added possible benefit of weight loss -needs complete tobacco cessation -would recommend aspirin at a minimum, possibly aspirin plus clopidogrel. Defer to vascular surgery on DAPT but would continue aspirin indefinitely  Hypertension: -remains on home amlodipine 5 mg daily -will not be overly aggressive with BP control prior to surgery to preserve blood flow, but would increase amlodipine and/or add additional agent (ARB is a good option) if not at goal post op. Avoid clonidine.   Risk Assessment/Risk Scores:  NA  For questions or updates, please contact [facility] Please consult [email or web address] for contact info under    Signed, Buford Dresser, MD  02/08/2021 6:13 PM

## 2021-02-08 NOTE — Progress Notes (Signed)
Site area: Right groin a 5 french sheath was removed  Site Prior to Removal:  Level 0  Pressure Applied For 20 MINUTES    Bedrest Beginning at 1245p X 4 hours  Manual:   Yes.    Patient Status During Pull:  stable  Post Pull Groin Site:  Level 0  Post Pull Instructions Given:  Yes.    Post Pull Pulses Present:  Yes.    Dressing Applied:  Yes.    Comments:

## 2021-02-08 NOTE — Progress Notes (Signed)
Discussed Dr. Oneida Alar will do angio today.  If she can be revascularized, or has adequate blood flow to heal a toe amp, I will proceed with left 5th toe amputation  today.   Annamarie Major

## 2021-02-08 NOTE — Anesthesia Preprocedure Evaluation (Deleted)
Anesthesia Evaluation    Reviewed: Allergy & Precautions, Patient's Chart, lab work & pertinent test results, Unable to perform ROS - Chart review only  Airway        Dental   Pulmonary neg pulmonary ROS, Current Smoker,           Cardiovascular hypertension, + Peripheral Vascular Disease       Neuro/Psych PSYCHIATRIC DISORDERS Anxiety negative neurological ROS     GI/Hepatic Neg liver ROS, GERD  ,  Endo/Other  diabetes  Renal/GU negative Renal ROS     Musculoskeletal  (+) Arthritis ,   Abdominal   Peds  Hematology negative hematology ROS (+) anemia ,   Anesthesia Other Findings   Reproductive/Obstetrics                             Anesthesia Physical Anesthesia Plan Anesthesia Quick Evaluation

## 2021-02-09 ENCOUNTER — Inpatient Hospital Stay (HOSPITAL_COMMUNITY): Payer: Medicare (Managed Care)

## 2021-02-09 DIAGNOSIS — I7092 Chronic total occlusion of artery of the extremities: Secondary | ICD-10-CM

## 2021-02-09 DIAGNOSIS — Z01810 Encounter for preprocedural cardiovascular examination: Secondary | ICD-10-CM | POA: Diagnosis not present

## 2021-02-09 DIAGNOSIS — I70262 Atherosclerosis of native arteries of extremities with gangrene, left leg: Secondary | ICD-10-CM

## 2021-02-09 LAB — CBC WITH DIFFERENTIAL/PLATELET
Abs Immature Granulocytes: 0.02 10*3/uL (ref 0.00–0.07)
Basophils Absolute: 0 10*3/uL (ref 0.0–0.1)
Basophils Relative: 1 %
Eosinophils Absolute: 0.1 10*3/uL (ref 0.0–0.5)
Eosinophils Relative: 2 %
HCT: 35.8 % — ABNORMAL LOW (ref 36.0–46.0)
Hemoglobin: 11 g/dL — ABNORMAL LOW (ref 12.0–15.0)
Immature Granulocytes: 0 %
Lymphocytes Relative: 23 %
Lymphs Abs: 1.2 10*3/uL (ref 0.7–4.0)
MCH: 26.8 pg (ref 26.0–34.0)
MCHC: 30.7 g/dL (ref 30.0–36.0)
MCV: 87.1 fL (ref 80.0–100.0)
Monocytes Absolute: 0.4 10*3/uL (ref 0.1–1.0)
Monocytes Relative: 8 %
Neutro Abs: 3.3 10*3/uL (ref 1.7–7.7)
Neutrophils Relative %: 66 %
Platelets: 222 10*3/uL (ref 150–400)
RBC: 4.11 MIL/uL (ref 3.87–5.11)
RDW: 15 % (ref 11.5–15.5)
WBC: 5 10*3/uL (ref 4.0–10.5)
nRBC: 0 % (ref 0.0–0.2)

## 2021-02-09 LAB — GLUCOSE, CAPILLARY
Glucose-Capillary: 103 mg/dL — ABNORMAL HIGH (ref 70–99)
Glucose-Capillary: 111 mg/dL — ABNORMAL HIGH (ref 70–99)
Glucose-Capillary: 114 mg/dL — ABNORMAL HIGH (ref 70–99)
Glucose-Capillary: 114 mg/dL — ABNORMAL HIGH (ref 70–99)
Glucose-Capillary: 138 mg/dL — ABNORMAL HIGH (ref 70–99)
Glucose-Capillary: 142 mg/dL — ABNORMAL HIGH (ref 70–99)

## 2021-02-09 LAB — MAGNESIUM: Magnesium: 1.9 mg/dL (ref 1.7–2.4)

## 2021-02-09 MED ORDER — OXYCODONE HCL 5 MG PO TABS: 5.0000 mg | ORAL_TABLET | ORAL | Status: DC | PRN

## 2021-02-09 MED ORDER — OXYCODONE-ACETAMINOPHEN 5-325 MG PO TABS
1.0000 | ORAL_TABLET | Freq: Four times a day (QID) | ORAL | Status: DC | PRN
  Administered 2021-02-09 – 2021-02-12 (×5): 1 via ORAL
  Filled 2021-02-09 (×5): qty 1

## 2021-02-09 MED ORDER — INSULIN ASPART 100 UNIT/ML IJ SOLN
0.0000 [IU] | Freq: Three times a day (TID) | INTRAMUSCULAR | Status: DC
  Administered 2021-02-11: 2 [IU] via SUBCUTANEOUS
  Administered 2021-02-12 – 2021-02-13 (×2): 3 [IU] via SUBCUTANEOUS
  Administered 2021-02-14 – 2021-02-15 (×4): 2 [IU] via SUBCUTANEOUS
  Administered 2021-02-17: 3 [IU] via SUBCUTANEOUS
  Administered 2021-02-18: 2 [IU] via SUBCUTANEOUS
  Administered 2021-02-18: 3 [IU] via SUBCUTANEOUS
  Administered 2021-02-18: 2 [IU] via SUBCUTANEOUS

## 2021-02-09 NOTE — Progress Notes (Signed)
VASCULAR LAB    Lower extremity vein mapping has been performed.  See CV proc for preliminary results.   Hal Norrington, RVT 02/09/2021, 4:14 PM

## 2021-02-09 NOTE — Progress Notes (Signed)
Subjective  -   The patient's daughter is in the room this morning.  They are both very upset.  This is because the patient came to the emergency department at [facility] 2 months ago, and from their perspective nothing was done, and now she is facing a leg bypass and toe amputation.   Physical Exam:  Groin cannulation site is soft without any  no changes in left fifth toe gangrene        Assessment/Plan:    Left toe dry gangrene: The patient has a flush occlusion of her left superficial femoral artery.  Her best option for revascularization is a left femoral to below-knee popliteal artery bypass graft.  I spoke with cardiology about surgical clearance which is occurring currently.  I have also ordered lower extremity vein mapping.  Once all of our information is available, I will make a determination on the timing of her surgery, most likely Wednesday.  Wells Quantavius Humm 02/09/2021 10:01 AM --  Vitals:   02/09/21 0407 02/09/21 0801  BP: (!) 151/81 (!) 160/96  Pulse: 82 76  Resp: 16 15  Temp: 98.9 F (37.2 C) 98.6 F (37 C)  SpO2: 95% 96%    Intake/Output Summary (Last 24 hours) at 02/09/2021 1001 Last data filed at 02/09/2021 0300 Gross per 24 hour  Intake 360 ml  Output 1300 ml  Net -940 ml     Laboratory CBC    Component Value Date/Time   WBC 5.0 02/09/2021 0108   HGB 11.0 (L) 02/09/2021 0108   HGB 13.6 05/15/2015 1108   HCT 35.8 (L) 02/09/2021 0108   HCT 42.2 05/15/2015 1108   PLT 222 02/09/2021 0108   PLT 280 05/15/2015 1108    BMET    Component Value Date/Time   NA 141 02/08/2021 0301   NA 143 05/15/2015 1108   K 3.3 (L) 02/08/2021 0301   CL 104 02/08/2021 0301   CO2 26 02/08/2021 0301   GLUCOSE 92 02/08/2021 0301   BUN 14 02/08/2021 0301   BUN 13 05/15/2015 1108   CREATININE 0.76 02/08/2021 0301   CALCIUM 8.6 (L) 02/08/2021 0301   GFRNONAA >60 02/08/2021 0301   GFRAA >60 12/20/2017 1529    COAG Lab Results  Component Value Date    INR 1.0 02/06/2021   INR 1.05 05/23/2013   No results found for: PTT  Antibiotics Anti-infectives (From admission, onward)    Start     Dose/Rate Route Frequency Ordered Stop   02/08/21 0630  vancomycin (VANCOREADY) IVPB 1000 mg/200 mL  Status:  Discontinued        1,000 mg 200 mL/hr over 60 Minutes Intravenous Every 24 hours 02/07/21 0841 02/08/21 1516   02/07/21 1600  cefTRIAXone (ROCEPHIN) 2 g in sodium chloride 0.9 % 100 mL IVPB  Status:  Discontinued        2 g 200 mL/hr over 30 Minutes Intravenous Every 24 hours 02/06/21 1013 02/07/21 0842   02/07/21 0930  cefTRIAXone (ROCEPHIN) 2 g in sodium chloride 0.9 % 100 mL IVPB  Status:  Discontinued        2 g 200 mL/hr over 30 Minutes Intravenous Every 24 hours 02/07/21 0841 02/08/21 1516   02/06/21 1900  vancomycin (VANCOREADY) IVPB 1000 mg/200 mL  Status:  Discontinued        1,000 mg 200 mL/hr over 60 Minutes Intravenous Every 12 hours 02/06/21 0734 02/07/21 0841   02/06/21 1600  metroNIDAZOLE (FLAGYL) IVPB 500 mg  Status:  Discontinued  500 mg 100 mL/hr over 60 Minutes Intravenous Every 8 hours 02/06/21 1013 02/07/21 1151   02/06/21 0745  ceFEPIme (MAXIPIME) 2 g in sodium chloride 0.9 % 100 mL IVPB  Status:  Discontinued        2 g 200 mL/hr over 30 Minutes Intravenous Every 8 hours 02/06/21 0734 02/06/21 1013   02/06/21 0745  vancomycin (VANCOREADY) IVPB 1750 mg/350 mL        1,750 mg 175 mL/hr over 120 Minutes Intravenous  Once 02/06/21 0734 02/06/21 1104        V. Leia Alf, M.D., [facility] [place] Office: [phone] Pager:  [phone]

## 2021-02-09 NOTE — Progress Notes (Signed)
HD#3 Subjective:  Overnight Events: None  Ms. Corpuz denies any overnight events.  She was able to sleep well as her daughter was able to spend the night.  At this time she denies any chest pain or shortness of breath.  She is having a little tenderness in her foot though.  We discussed again her results from yesterday as well as the evaluation from cardiology.  We discussed their recommendation of a stress test and both Ms. Sedore and her daughter are very concerned about the risks of doing a stress test.  They would like to speak to cardiology regarding it again.  Objective:  Vital signs in last 24 hours: Vitals:   02/08/21 1800 02/08/21 2000 02/08/21 2332 02/09/21 0407  BP: (!) 158/82 (!) 148/84 (!) 150/88 (!) 151/81  Pulse: 74 93 88 82  Resp: 16 16 14 16   Temp:  98.7 F (37.1 C) 98.5 F (36.9 C) 98.9 F (37.2 C)  TempSrc:  Oral Oral Oral  SpO2: 98% 94% 96% 95%  Weight:      Height:       Supplemental O2: Room Air  Physical Exam:  Constitutional: well-appearing, in no acute distress HENT: normocephalic atraumatic Cardiac: Regular rate and rhythm with no murmurs rubs or gallops.  No lower extremity edema Pulmonary/Chest: normal work of breathing on room air.  Clear to auscultation bilaterally MSK: normal bulk and tone,  Skin: Gangrenous (dry) left fifth toe without any surrounding erythema or edema Neurological: alert & oriented x 3 Psych: Normal mood and affect  Filed Weights   02/06/21 0700  Weight: 91 kg    Intake/Output Summary (Last 24 hours) at 02/09/2021 0821 Last data filed at 02/09/2021 0300 Gross per 24 hour  Intake 360 ml  Output 1300 ml  Net -940 ml   Net IO Since Admission: -239.22 mL [02/09/21 0821]  Pertinent Labs: CBC Latest Ref Rng & Units 02/09/2021 02/08/2021 02/07/2021  WBC 4.0 - 10.5 K/uL 5.0 4.5 5.2  Hemoglobin 12.0 - 15.0 g/dL 11.0(L) 11.2(L) 11.6(L)  Hematocrit 36.0 - 46.0 % 35.8(L) 34.9(L) 37.5  Platelets 150 - 400 K/uL 222 215 202     CMP Latest Ref Rng & Units 02/08/2021 02/07/2021 02/05/2021  Glucose 70 - 99 mg/dL 92 97 118(H)  BUN 8 - 23 mg/dL 14 18 7(L)  Creatinine 0.44 - 1.00 mg/dL 0.76 0.91 0.85  Sodium 135 - 145 mmol/L 141 141 141  Potassium 3.5 - 5.1 mmol/L 3.3(L) 3.5 3.2(L)  Chloride 98 - 111 mmol/L 104 106 103  CO2 22 - 32 mmol/L 26 27 28   Calcium 8.9 - 10.3 mg/dL 8.6(L) 8.5(L) 9.0  Total Protein 6.5 - 8.1 g/dL - - 7.6  Total Bilirubin 0.3 - 1.2 mg/dL - - 0.6  Alkaline Phos 38 - 126 U/L - - 94  AST 15 - 41 U/L - - 17  ALT 0 - 44 U/L - - 18    Imaging: PERIPHERAL VASCULAR CATHETERIZATION  Result Date: 02/08/2021 Procedure: Ultrasound right groin, abdominal aortogram with bilateral lower extremity runoff Preoperative diagnosis: Gangrene left foot Postoperative diagnosis: Same Anesthesia: Local Operative findings: 1.  Flush occlusion left superficial femoral artery, 70% stenosis with calcific plaque left popliteal artery, proximal disease of posterior tibial anterior tibial arteries with two-vessel outflow to the left foot via the anterior tibial and posterior tibial artery Operative details: After obtaining informed consent, the patient was taken the [place] lab.  The patient was placed in supine position on the angio table.  Both groins were prepped and draped in usual sterile fashion.  Ultrasound was used to identify the right common femoral artery and femoral bifurcation.  Local anesthesia was infiltrated over the right common femoral artery.  An introducer needle was used to cannulate the right common femoral artery under ultrasound guidance.  035 versa core wire was advanced and the abdominal aorta under fluoroscopic guidance.  A 5 French sheath was placed over the guidewire in the right common femoral artery.  This was thoroughly flushed with heparinized saline.  5 French pigtail catheter was then advanced over the guidewire to the abdominal aorta and abdominal aortogram was obtained in AP projection.  Right renal  arteries are patent.  Infrarenal abdominal aorta is patent.  Left and right common external and internal iliac arteries are all patent. At this point bilateral lower extremity runoff views were obtained through the pigtail catheter. In the right lower extremity the right common femoral profunda femoris and superficial femoral arteries are patent.  There is irregular atherosclerotic plaque in the right popliteal artery.  There is also irregular atherosclerotic plaque in the tibial vessels of the right leg but these are intact. In the left lower extremity the left common femoral artery is patent.  The left profunda femoris is patent.  The left superficial femoral artery has a flush occlusion at its origin.  The above-knee popliteal artery does reconstitute but this is a very diseased vessel with severe calcific plaque and a 70% stenosis.  There is a less narrowing in the below-knee popliteal artery but again this is severely calcified.  There is two-vessel runoff via mildly diseased anterior tibial and posterior tibial artery to the left foot. At this point the pigtail catheter was swapped out for a 5 [place] crossover catheter.  This was used to selectively catheterize the left common iliac artery and the guidewire advanced down into the distal left common femoral artery.  This was then replaced with a 5 French straight catheter.  Additional views of the left leg were obtained with views behind a prosthetic knee and further clarify the tibial vessels from the above images. At this point the 5 French straight catheter was removed the 5 French sheath was left in place to be pulled in the holding area.  The patient tolerated procedure well and there were no complications. Operative management: Patient's images will be reviewed by Dr. Trula Slade most likely patient will require lower extremity bypass grafting prior to her toe amputation. Ruta Hinds, MD [facility] Office: [phone]     Assessment/Plan:   Principal Problem:   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 ([place]) Active Problems:   Hypertension   Tobacco abuse   Diabetes mellitus with peripheral vascular disease ([place])   Gangrene ([place])  Patient Summary: MEYLIN STENZEL is a 70 y.o. with a pertinent PMH of type 2 diabetes, tobacco use disorder, hypertension, who presented with pain of left fifth toe and admitted for dry gangrenous fifth left toe  Dry gangrene, left fifth toe Aortogram this afternoon with evidence of severe vessel disease involving the left popliteal artery. Per vascular note, plan is for bypass surgery prior to amputation, expected to be on Wednesday.  Cardiology has been consulted for presurgical clearance.  They are recommending a stress test, however patient would like more time to consider it.  -Appreciate vascular as well as orthopedic surgeries consultation -Pain control - Tylenol 650 mg q6h PRN for mild to moderate pain, oxycodone 5 mg q4h PRN  for severe pain -Continue high intensity statin, Atorvastatin 40 mg daily -Continue daily aspirin 81 mg  Hypertension Patient required Hydralazine and Labetalol prior to cath, will likely need more aggressive blood pressure control prior to discharge.  -For now, continue Amlodipine 5 mg daily  Type 2 diabetes Patient notes history of type 2 diabetes that has been untreated.  A1c during admission of 6.1%. CBGs are within goal during hospitalization thus far.  -Continue SSI  Tobacco use disorder Patient with extensive history of tobacco use. Patient is interested in quitting and will plan to start Chantix on discharge.  Diet: Renal/carb modified.  IVF: None,None VTE: Heparin Code: Full PT/OT recs: Pending  Dispo: Pending surgery plan for next week  Dr. Jose Persia Internal Medicine PGY-2  Pager: [phone] After 5pm on weekdays and 1pm on weekends: On Call pager [phone]  02/09/2021, 8:21 AM

## 2021-02-09 NOTE — Progress Notes (Signed)
   Dr. Judeth Cornfield consult note reviewed as well as the progress note today by Dr. Trula Slade - tentatively planning LE arterial bypass next Wednesday - Ms. Mansour is sedentary and there was discussion about possible myoview stress testing prior to surgery, however, it was thought if she declined testing she could still proceed with surgery. It was noted that she was not having anginal symptoms.  Cardiology will be available over the weekend and can further discuss pre-operative work-up with patient and family members on Monday.  Pixie Casino, MD, [facility], [place] Director of the Advanced Lipid Disorders &  Cardiovascular Risk Reduction Clinic Diplomate of the American Board of Clinical Lipidology Attending Cardiologist  Direct Dial: [phone]  Fax: [phone]  Website:  [email or web address]

## 2021-02-10 LAB — BASIC METABOLIC PANEL
Anion gap: 7 (ref 5–15)
BUN: 19 mg/dL (ref 8–23)
CO2: 26 mmol/L (ref 22–32)
Calcium: 8.6 mg/dL — ABNORMAL LOW (ref 8.9–10.3)
Chloride: 106 mmol/L (ref 98–111)
Creatinine, Ser: 0.94 mg/dL (ref 0.44–1.00)
GFR, Estimated: >60 mL/min (ref >60)
Glucose, Bld: 123 mg/dL — ABNORMAL HIGH (ref 70–99)
Potassium: 4 mmol/L (ref 3.5–5.1)
Sodium: 139 mmol/L (ref 135–145)

## 2021-02-10 LAB — GLUCOSE, CAPILLARY
Glucose-Capillary: 99 mg/dL (ref 70–99)
Glucose-Capillary: 107 mg/dL — ABNORMAL HIGH (ref 70–99)
Glucose-Capillary: 110 mg/dL — ABNORMAL HIGH (ref 70–99)
Glucose-Capillary: 109 mg/dL — ABNORMAL HIGH (ref 70–99)

## 2021-02-10 MED ORDER — AMLODIPINE BESYLATE 10 MG PO TABS
10.0000 mg | ORAL_TABLET | Freq: Every day | ORAL | Status: DC
  Administered 2021-02-10 – 2021-02-19 (×10): 10 mg via ORAL
  Filled 2021-02-10 (×10): qty 1

## 2021-02-10 NOTE — Progress Notes (Signed)
HD#4 Subjective:   Overnight Events: Patient received one time dose of PRN Hydralazine for elevated BP of 175/92.  Tiffany [name] states she was not able to sleep well last night. She denies any changes in her foot pain. She denies any other acute complaints.   Objective:  Vital signs in last 24 hours: Vitals:   02/09/21 2000 02/09/21 2314 02/10/21 0329 02/10/21 0400  BP: (!) 158/83 132/76 (!) 175/92 (!) 148/80  Pulse: 73 79 67   Resp: 19 14 16 16   Temp: 98.4 F (36.9 C) 98.6 F (37 C) 98.2 F (36.8 C)   TempSrc: Oral Oral Oral   SpO2: 97% 98% 99%   Weight:   96.6 kg   Height:       Supplemental O2: Room Air  Physical Exam:  Constitutional: well-appearing, in no acute distress HENT: normocephalic atraumatic Cardiac: Regular rate and rhythm with no murmurs rubs or gallops.  No lower extremity edema Pulmonary/Chest: normal work of breathing on room air.  Clear to auscultation bilaterally MSK: normal bulk and tone,  Skin: Gangrenous (dry) left fifth toe without any surrounding erythema or edema. Small amount of dark bloody discharge though.  Neurological: alert & oriented x 3 Psych: Normal mood and affect  Filed Weights   02/06/21 0700 02/10/21 0329  Weight: 91 kg 96.6 kg    Intake/Output Summary (Last 24 hours) at 02/10/2021 0644 Last data filed at 02/10/2021 6226 Gross per 24 hour  Intake 480 ml  Output 400 ml  Net 80 ml   Net IO Since Admission: -159.22 mL [02/10/21 0644]  Pertinent Labs: CBC Latest Ref Rng & Units 02/09/2021 02/08/2021 02/07/2021  WBC 4.0 - 10.5 K/uL 5.0 4.5 5.2  Hemoglobin 12.0 - 15.0 g/dL 11.0(L) 11.2(L) 11.6(L)  Hematocrit 36.0 - 46.0 % 35.8(L) 34.9(L) 37.5  Platelets 150 - 400 K/uL 222 215 202    CMP Latest Ref Rng & Units 02/10/2021 02/08/2021 02/07/2021  Glucose 70 - 99 mg/dL 123(H) 92 97  BUN 8 - 23 mg/dL 19 14 18   Creatinine 0.44 - 1.00 mg/dL 0.94 0.76 0.91  Sodium 135 - 145 mmol/L 139 141 141  Potassium 3.5 - 5.1 mmol/L 4.0 3.3(L)  3.5  Chloride 98 - 111 mmol/L 106 104 106  CO2 22 - 32 mmol/L 26 26 27   Calcium 8.9 - 10.3 mg/dL 8.6(L) 8.6(L) 8.5(L)  Total Protein 6.5 - 8.1 g/dL - - -  Total Bilirubin 0.3 - 1.2 mg/dL - - -  Alkaline Phos 38 - 126 U/L - - -  AST 15 - 41 U/L - - -  ALT 0 - 44 U/L - - -    Imaging: VAS [place] LOWER EXTREMITY SAPHENOUS VEIN MAPPING  Result Date: 02/09/2021 [place] MAPPING Patient Name:  Tiffany [name]  Date of Exam:   02/09/2021 Medical Rec #: [id number]         Accession #:    [id number] Date of Birth: [birth year]         Patient Gender: F Patient Age:   70Y Exam Location:  [facility] Procedure:      VAS [place] LOWER EXTREMITY SAPHENOUS VEIN MAPPING Referring Phys: [place] --------------------------------------------------------------------------------  Indications:  Pre-op Risk Factors: PAD.  Comparison Study: no prior study Performing Technologist: Sharion Dove RVS  Examination Guidelines: A complete evaluation includes B-mode imaging, spectral Doppler, color Doppler, and power Doppler as needed of all accessible portions of each vessel. Bilateral testing is considered an integral part  of a complete examination. Limited examinations for reoccurring indications may be performed as noted. +---------------+-----------+----------------------+---------------+-----------+   RT Diameter  RT Findings         GSV            LT Diameter  LT Findings      (cm)                                            (cm)                  +---------------+-----------+----------------------+---------------+-----------+      0.49                     Saphenofemoral         0.55       branching                                   Junction                                  +---------------+-----------+----------------------+---------------+-----------+      0.43       branching     Proximal thigh         0.50                   +---------------+-----------+----------------------+---------------+-----------+      0.49                       Mid thigh            0.46       branching  +---------------+-----------+----------------------+---------------+-----------+      0.37       branching      Distal thigh          0.43       branching  +---------------+-----------+----------------------+---------------+-----------+      0.21                          Knee              0.45                  +---------------+-----------+----------------------+---------------+-----------+      0.26       branching       Prox calf            0.43                  +---------------+-----------+----------------------+---------------+-----------+      0.34       branching        Mid calf            0.31       branching  +---------------+-----------+----------------------+---------------+-----------+      0.21       branching      Distal calf           0.25       branching  +---------------+-----------+----------------------+---------------+-----------+      0.20                         Ankle  0.36       branching  +---------------+-----------+----------------------+---------------+-----------+ Diagnosing physician: Harold Barban MD Electronically signed by Harold Barban MD on 02/09/2021 at 11:35:36 PM.    Final     Assessment/Plan:   Principal Problem:   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 ([place]) Active Problems:   Hypertension   Tobacco abuse   Diabetes mellitus with peripheral vascular disease ([place])   Gangrene ([place])  [name] is a 70 y.o. with a pertinent PMH of type 2 diabetes, tobacco use disorder, hypertension, who presented with pain of left fifth toe and admitted for dry gangrenous fifth left toe  Dry gangrene, left fifth toe Aortogram this afternoon with evidence of severe vessel disease involving the left popliteal artery. Per vascular note, plan is for bypass  surgery prior to amputation, expected to be on Wednesday.  Cardiology has been consulted for presurgical clearance.  They are recommending a stress test, however patient would like more time to consider it. Per note, cardiology will speak to her regarding this again tomorrow morning.   -Appreciate vascular as well as orthopedic surgeries consultation -Pain control - Tylenol 650 mg q6h PRN for mild to moderate pain, oxycodone 5 mg q4h PRN for severe pain -Continue high intensity statin, Atorvastatin 40 mg daily -Continue daily aspirin 81 mg  Hypertension Required PRN Hydralazine overnight. Will increase Amlodipine.   -Increase Amlodipine to 10 mg daily.   Type 2 diabetes Patient notes history of type 2 diabetes that has been untreated.  A1c during admission of 6.1%. CBGs are within goal during hospitalization thus far.  -Continue SSI  Tobacco use disorder Patient with extensive history of tobacco use. Patient is interested in quitting and will plan to start Chantix on discharge.  Diet: Renal/carb modified.  IVF: None,None VTE: Heparin Code: Full PT/OT recs: Pending  Dispo: Pending surgery plan for next week  Dr. Jose Persia Internal Medicine PGY-2  Pager: [phone] After 5pm on weekdays and 1pm on weekends: On Call pager [phone]  02/10/2021, 6:44 AM

## 2021-02-10 NOTE — Progress Notes (Signed)
   02/10/21 0329  Vitals  Temp 98.2 F (36.8 C)  Temp Source Oral  BP (!) 175/92  MAP (mmHg) 116  BP Location Left Arm  BP Method Automatic  Patient Position (if appropriate) Lying  Pulse Rate 67  Pulse Rate Source Monitor  ECG Heart Rate 83  Resp 16   5 mg hydralazine given per md order

## 2021-02-11 ENCOUNTER — Encounter (HOSPITAL_COMMUNITY): Payer: Self-pay | Admitting: Vascular Surgery

## 2021-02-11 DIAGNOSIS — I70262 Atherosclerosis of native arteries of extremities with gangrene, left leg: Secondary | ICD-10-CM

## 2021-02-11 LAB — GLUCOSE, CAPILLARY
Glucose-Capillary: 121 mg/dL — ABNORMAL HIGH (ref 70–99)
Glucose-Capillary: 115 mg/dL — ABNORMAL HIGH (ref 70–99)
Glucose-Capillary: 126 mg/dL — ABNORMAL HIGH (ref 70–99)
Glucose-Capillary: 95 mg/dL (ref 70–99)

## 2021-02-11 LAB — CULTURE, BLOOD (ROUTINE X 2)
Culture: NO GROWTH
Culture: NO GROWTH

## 2021-02-11 MED ORDER — LOSARTAN POTASSIUM 25 MG PO TABS
25.0000 mg | ORAL_TABLET | Freq: Every day | ORAL | Status: DC
  Administered 2021-02-11 – 2021-02-14 (×4): 25 mg via ORAL
  Filled 2021-02-11 (×4): qty 1

## 2021-02-11 NOTE — Progress Notes (Signed)
   02/11/21 0347  Vitals  Temp 98.1 F (36.7 C)  Temp Source Oral  BP (!) 181/96  MAP (mmHg) 114  BP Location Left Arm  BP Method Automatic  Patient Position (if appropriate) Lying  Pulse Rate 100  Pulse Rate Source Monitor  ECG Heart Rate 100  Resp 16  10 mg prn labetalol given per md order

## 2021-02-11 NOTE — Progress Notes (Signed)
HD#5 Subjective:  Overnight Events: Patient was noted to be hypertensive and received 10 mg IV Labetalol.   This morning, Tiffany [name] states she is feeling tired. She is eating and drinking well.  She continues to feel overwhelmed by everything that is occurred during her admission and the amount of specialist she is seeing.  She denies any other acute complaints. We discussed Cardiology's recommendation for a stress test. She states she does not want to do it, especially since it will not change surgical intervention or plan. She would like our team to discuss with Cardiology.  Tiffany [name] has no other acute needs at this time.   Objective:  Vital signs in last 24 hours: Vitals:   02/11/21 0347 02/11/21 0623 02/11/21 0753 02/11/21 1237  BP: (!) 181/96 (!) 180/99 (!) 186/90 (!) 159/89  Pulse: 100  85 83  Resp: 16  20 19   Temp: 98.1 F (36.7 C)  98.5 F (36.9 C) 98 F (36.7 C)  TempSrc: Oral  Oral Oral  SpO2: 100%  100% 98%  Weight: 94.9 kg     Height:       Supplemental O2: Saturating well on room air Physical Exam:  Constitutional: In no acute distress, resting comfortably HENT: normocephalic atraumatic Eyes: conjunctiva non-erythematous Neck: supple Cardiovascular: regular rate and rhythm, no m/r/g Pulmonary/Chest: normal work of breathing on room air Abdominal: soft, non-tender, non-distended MSK: normal bulk and tone Neurological: alert & oriented x 3 Skin: warm and dry.  Dry gangrenous left fifth toe. Psych: Flat affect  Filed Weights   02/06/21 0700 02/10/21 0329 02/11/21 0347  Weight: 91 kg 96.6 kg 94.9 kg     Intake/Output Summary (Last 24 hours) at 02/11/2021 1345 Last data filed at 02/11/2021 0909 Gross per 24 hour  Intake 120 ml  Output 1700 ml  Net -1580 ml   Net IO Since Admission: -2,789.22 mL [02/11/21 1345]  Pertinent Labs: CBC Latest Ref Rng & Units 02/09/2021 02/08/2021 02/07/2021  WBC 4.0 - 10.5 K/uL 5.0 4.5 5.2  Hemoglobin 12.0 - 15.0 g/dL  11.0(L) 11.2(L) 11.6(L)  Hematocrit 36.0 - 46.0 % 35.8(L) 34.9(L) 37.5  Platelets 150 - 400 K/uL 222 215 202    CMP Latest Ref Rng & Units 02/10/2021 02/08/2021 02/07/2021  Glucose 70 - 99 mg/dL 123(H) 92 97  BUN 8 - 23 mg/dL 19 14 18   Creatinine 0.44 - 1.00 mg/dL 0.94 0.76 0.91  Sodium 135 - 145 mmol/L 139 141 141  Potassium 3.5 - 5.1 mmol/L 4.0 3.3(L) 3.5  Chloride 98 - 111 mmol/L 106 104 106  CO2 22 - 32 mmol/L 26 26 27   Calcium 8.9 - 10.3 mg/dL 8.6(L) 8.6(L) 8.5(L)  Total Protein 6.5 - 8.1 g/dL - - -  Total Bilirubin 0.3 - 1.2 mg/dL - - -  Alkaline Phos 38 - 126 U/L - - -  AST 15 - 41 U/L - - -  ALT 0 - 44 U/L - - -    Imaging: No results found.  Assessment/Plan:   Principal Problem:   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 ([facility]) Active Problems:   Preoperative cardiovascular examination   Hypertension   Tobacco abuse   Diabetes mellitus with peripheral vascular disease ([place])   Gangrene ([place])  [name] is a 70 y.o. with a pertinent PMH of type 2 diabetes, tobacco use disorder, hypertension, who presented with pain of left fifth toe and admitted for dry gangrenous fifth left toe   Dry  gangrene, left fifth toe PAD Patient with severe peripheral artery disease, angiogram revealed occlusion of left superficial femoral artery.  Vascular surgery following and recommend revascularization with left fem below knee popliteal artery bypass.  Vein mapping performed yesterday which revealed adequate vein conduit for bypass.  Vascular surgery requested cardiology consult for operative clearance.  Patient and daughter declined stress test after coming in the decision that this would not alter the overall outcome which was surgery.  Patient acknowledges understanding the risks and benefits.  Patient requested I reach out to cardiology, discussed this with Dr. Radford Pax.  Cardiology will sign off at this time per Dr. Radford Pax.  Patient scheduled for amputation on  02/13/2021.  Will make patient n.p.o. tomorrow midnight. -Appreciate cardiology, vascular and orthopedic surgeries assistance -Plan for OR for bypass/left 5th toe amputation 02/13/21 -Pain control - Tylenol 650 mg q6h PRN for mild to moderate pain, Percocet 5-325 mg q4h PRN for severe pain -Continue high intensity statin, Atorvastatin 40 mg daily -Continue daily aspirin 81 mg -NPO evening of 02/12/21 at midnight -PT/OT postop   Hypertension Patient continues to be hypotensive and she was increased amlodipine 10 mg.  We will also add losartan 25 mg daily.  Patient with creatinine of under 1 with GFR over 60. -Amlodipine to 10 mg daily -Start losartan 25 mg daily   Type 2 diabetes Patient notes history of type 2 diabetes that has been untreated.  A1c during admission of 6.1%. CBGs are within goal during hospitalization thus far. -Continue SSI   Tobacco use disorder Patient with extensive history of tobacco use. Patient is interested in quitting and will plan to start Chantix on discharge.  Diet: Carb-Modified IVF: None,None VTE: Heparin Code: Full PT/OT recs: Pending, postop TOC recs: None at this time  Dispo: Anticipated discharge to Home pending surgical amputation in 2 days and postoperative management.  Sanjuana Letters DO Internal Medicine Resident PGY-1 Pager [phone] Please contact the on call pager after 5 pm and on weekends at [phone].

## 2021-02-11 NOTE — Progress Notes (Signed)
Progress Note  Patient Name: Tiffany [name] Date of Encounter: 02/11/2021  [facility] Cardiologist: None   Subjective   Patient denies any chest pain or SOB  Inpatient Medications    Scheduled Meds:  amLODipine  10 mg Oral Daily   aspirin EC  81 mg Oral Daily   atorvastatin  40 mg Oral Daily   heparin  5,000 Units Subcutaneous Q8H   insulin aspart  0-15 Units Subcutaneous TID WC   losartan  25 mg Oral Daily   sodium chloride flush  3 mL Intravenous Q12H   Continuous Infusions:  sodium chloride 250 mL (02/09/21 0537)   PRN Meds: sodium chloride, acetaminophen **OR** acetaminophen, ondansetron **OR** ondansetron (ZOFRAN) IV, oxyCODONE-acetaminophen, sodium chloride flush   Vital Signs    Vitals:   02/11/21 0347 02/11/21 0623 02/11/21 0753 02/11/21 1237  BP: (!) 181/96 (!) 180/99 (!) 186/90 (!) 159/89  Pulse: 100  85 83  Resp: 16  20 19   Temp: 98.1 F (36.7 C)  98.5 F (36.9 C) 98 F (36.7 C)  TempSrc: Oral  Oral Oral  SpO2: 100%  100% 98%  Weight: 94.9 kg     Height:        Intake/Output Summary (Last 24 hours) at 02/11/2021 1507 Last data filed at 02/11/2021 7829 Gross per 24 hour  Intake 120 ml  Output 1700 ml  Net -1580 ml   Last 3 Weights 02/11/2021 02/10/2021 02/06/2021  Weight (lbs) 209 lb 3.5 oz 212 lb 15.4 oz 200 lb 9.9 oz  Weight (kg) 94.9 kg 96.6 kg 91 kg      Telemetry    NSR - Personally Reviewed  ECG    No new EKG to review - Personally Reviewed  Physical Exam   GEN: No acute distress.   Neck: No JVD Cardiac: RRR, no murmurs, rubs, or gallops.  Respiratory: Clear to auscultation bilaterally. GI: Soft, nontender, non-distended  MS: No edema; No deformity. Neuro:  Nonfocal  Psych: Normal affect   Labs    High Sensitivity Troponin:  No results for input(s): TROPONINIHS in the last 720 hours.    Chemistry Recent Labs  Lab 02/05/21 2125 02/07/21 0358 02/08/21 0301 02/10/21 0124  NA 141 141 141 139  K 3.2* 3.5 3.3* 4.0   CL 103 106 104 106  CO2 28 27 26 26   GLUCOSE 118* 97 92 123*  BUN 7* 18 14 19   CREATININE 0.85 0.91 0.76 0.94  CALCIUM 9.0 8.5* 8.6* 8.6*  PROT 7.6  --   --   --   ALBUMIN 3.6  --   --   --   AST 17  --   --   --   ALT 18  --   --   --   ALKPHOS 94  --   --   --   BILITOT 0.6  --   --   --   GFRNONAA >60 >60 >60 >60  ANIONGAP 10 8 11 7      Hematology Recent Labs  Lab 02/07/21 0358 02/08/21 0301 02/09/21 0108  WBC 5.2 4.5 5.0  RBC 4.28 4.09 4.11  HGB 11.6* 11.2* 11.0*  HCT 37.5 34.9* 35.8*  MCV 87.6 85.3 87.1  MCH 27.1 27.4 26.8  MCHC 30.9 32.1 30.7  RDW 14.7 14.6 15.0  PLT 202 215 222    BNPNo results for input(s): BNP, PROBNP in the last 168 hours.   DDimer No results for input(s): DDIMER in the last 168 hours.  Radiology    VAS [place] LOWER EXTREMITY SAPHENOUS VEIN MAPPING  Result Date: 02/09/2021 LOWER EXTREMITY VEIN MAPPING Patient Name:  Tiffany [name]  Date of Exam:   02/09/2021 Medical Rec #: [id number]         Accession #:    [id number] Date of Birth: Jan 07, 1951         Patient Gender: F Patient Age:   69Y Exam Location:  [facility] Procedure:      VAS [place] LOWER EXTREMITY SAPHENOUS VEIN MAPPING Referring Phys: [id number] [name] --------------------------------------------------------------------------------  Indications:  Pre-op Risk Factors: PAD.  Comparison Study: no prior study Performing Technologist: Sharion Dove RVS  Examination Guidelines: A complete evaluation includes B-mode imaging, spectral Doppler, color Doppler, and power Doppler as needed of all accessible portions of each vessel. Bilateral testing is considered an integral part of a complete examination. Limited examinations for reoccurring indications may be performed as noted. +---------------+-----------+----------------------+---------------+-----------+   RT Diameter  RT Findings         GSV            LT Diameter  LT Findings      (cm)                                             (cm)                  +---------------+-----------+----------------------+---------------+-----------+      0.49                     Saphenofemoral         0.55       branching                                   Junction                                  +---------------+-----------+----------------------+---------------+-----------+      0.43       branching     Proximal thigh         0.50                  +---------------+-----------+----------------------+---------------+-----------+      0.49                       Mid thigh            0.46       branching  +---------------+-----------+----------------------+---------------+-----------+      0.37       branching      Distal thigh          0.43       branching  +---------------+-----------+----------------------+---------------+-----------+      0.21                          Knee              0.45                  +---------------+-----------+----------------------+---------------+-----------+      0.26       branching       Prox calf  0.43                  +---------------+-----------+----------------------+---------------+-----------+      0.34       branching        Mid calf            0.31       branching  +---------------+-----------+----------------------+---------------+-----------+      0.21       branching      Distal calf           0.25       branching  +---------------+-----------+----------------------+---------------+-----------+      0.20                         Ankle              0.36       branching  +---------------+-----------+----------------------+---------------+-----------+ Diagnosing physician: Harold Barban MD Electronically signed by Harold Barban MD on 02/09/2021 at 11:35:36 PM.    Final     Cardiac Studies   none  Patient Profile     70 y.o. female a hx of type II diabetes, tobacco use who is being seen 02/08/2021 for the evaluation of preoperative  cardiovascular evaluation at the request of Dr. Evette Doffing.    Assessment & Plan    Preoperative cardiovascular risk assessment: -Planned for lower extremity bypass grafting prior to toe amputation. Has dry gangrene of left fifth toe -Based on available date, patient's RCRI score = 0, which carries a 3.9% 30-day risk of death, MI, or cardiac arrest. -she has no cardiac symptoms at baseline but is very sedentary.  EKG with nonspecific ST abnormality -Lexiscan myoview was recommended prior to surgery due to inability to assess for angina with exertion -patient is very adamant that she will not consent to further stress testint -she wanted [place] to talk with her daughter but I have not been able to contact her>>she apparently does not have a cell phone and the person she is staying with has a cell phone that her daughter is using as her contact.  I talked with the contact but the daughter was not with her and there is no phone at the place she is staying.  -The patient does not have known disease, and it is not a high risk surgery, so if she declines I think she could still proceed to surgery with close monitoring.   PAD: -planned for lower extremity bypass graft and toe amputation as above -management per vascular surgery -agree with atorvastatin -given PAD and type II diabetes, would consider GLP1RA. While the data for amputation with SGLT2i is inconclusive, GLP1RA is a good alternative with added possible benefit of weight loss -needs complete tobacco cessation -would recommend aspirin at a minimum, possibly aspirin plus clopidogrel. Defer to vascular surgery on DAPT but would continue aspirin indefinitely   Hypertension: -BP still remains elevated -will not be overly aggressive with BP control prior to surgery to preserve blood flow, but would increase amlodipine and/or add additional agent (ARB is a good option) if not at goal post op. Avoid clonidine.   [facility] will sign off.    Medication Recommendations:  see above note Other recommendations (labs, testing, etc):  none Follow up as an outpatient:  Dr. Harrell Gave post surgery outpt  For questions or updates, please contact [facility] Please consult [email or web address] for contact info under        Signed, Fransico Him, MD  02/11/2021, 3:07  PM

## 2021-02-11 NOTE — Care Management Important Message (Signed)
Important Message  Patient Details  Name: Tiffany [name] MRN: [id number] Date of Birth: 1951-08-16   Medicare Important Message Given:  Yes     Katelind Pytel 02/11/2021, 4:02 PM

## 2021-02-11 NOTE — Progress Notes (Addendum)
  Progress Note    02/11/2021 7:55 AM 3 Days Post-Op  Subjective:  no major complaints. Left foot pain unchanged   Vitals:   02/11/21 0623 02/11/21 0753  BP: (!) 180/99 (!) 186/90  Pulse:  85  Resp:  20  Temp:  98.5 F (36.9 C)  SpO2:  100%   Physical Exam: Cardiac:  regula Lungs:  non labored Extremities: femoral access site soft without hematoma. left toe dry gangrene unchagned. No palpable distal pulses Neurologic: alert and oriented  CBC    Component Value Date/Time   WBC 5.0 02/09/2021 0108   RBC 4.11 02/09/2021 0108   HGB 11.0 (L) 02/09/2021 0108   HGB 13.6 05/15/2015 1108   HCT 35.8 (L) 02/09/2021 0108   HCT 42.2 05/15/2015 1108   PLT 222 02/09/2021 0108   PLT 280 05/15/2015 1108   MCV 87.1 02/09/2021 0108   MCV 84 05/15/2015 1108   MCH 26.8 02/09/2021 0108   MCHC 30.7 02/09/2021 0108   RDW 15.0 02/09/2021 0108   RDW 15.4 05/15/2015 1108   LYMPHSABS 1.2 02/09/2021 0108   LYMPHSABS 1.6 05/15/2015 1108   MONOABS 0.4 02/09/2021 0108   EOSABS 0.1 02/09/2021 0108   EOSABS 0.1 05/15/2015 1108   BASOSABS 0.0 02/09/2021 0108   BASOSABS 0.0 05/15/2015 1108    BMET    Component Value Date/Time   NA 139 02/10/2021 0124   NA 143 05/15/2015 1108   K 4.0 02/10/2021 0124   CL 106 02/10/2021 0124   CO2 26 02/10/2021 0124   GLUCOSE 123 (H) 02/10/2021 0124   BUN 19 02/10/2021 0124   BUN 13 05/15/2015 1108   CREATININE 0.94 02/10/2021 0124   CALCIUM 8.6 (L) 02/10/2021 0124   GFRNONAA >60 02/10/2021 0124   GFRAA >60 12/20/2017 1529    INR    Component Value Date/Time   INR 1.0 02/06/2021 0943     Intake/Output Summary (Last 24 hours) at 02/11/2021 0755 Last data filed at 02/11/2021 0349 Gross per 24 hour  Intake 120 ml  Output 2050 ml  Net -1930 ml     Assessment/Plan:  71 y.o. female with left toe dry gangrene. Angiogram showed flush occlusion of left SFA. Recommendation has been revascularization with left fem- below knee popliteal artery  bypass.  - Vein mapping shows adequate vein conduit for bypass - Pending cardiology discussion with patient and family today and pre op clearance - LLE fem- bk pop bypass still pending for Wednesday 6/22   Karoline Caldwell, [facility] [phone] 02/11/2021 7:55 AM   I agree with the above.  I have seen and evaluated the patient.  She has an adequate vein for bypass.  I discussed proceeding with left femoral-popliteal bypass graft and toe amputation on Wednesday.  Annamarie Major

## 2021-02-12 DIAGNOSIS — I70262 Atherosclerosis of native arteries of extremities with gangrene, left leg: Secondary | ICD-10-CM

## 2021-02-12 LAB — BASIC METABOLIC PANEL
Anion gap: 9 (ref 5–15)
BUN: 13 mg/dL (ref 8–23)
CO2: 26 mmol/L (ref 22–32)
Calcium: 9.1 mg/dL (ref 8.9–10.3)
Chloride: 103 mmol/L (ref 98–111)
Creatinine, Ser: 0.9 mg/dL (ref 0.44–1.00)
GFR, Estimated: >60 mL/min (ref >60)
Glucose, Bld: 106 mg/dL — ABNORMAL HIGH (ref 70–99)
Potassium: 3.7 mmol/L (ref 3.5–5.1)
Sodium: 138 mmol/L (ref 135–145)

## 2021-02-12 LAB — CBC WITH DIFFERENTIAL/PLATELET
Abs Immature Granulocytes: 0.01 10*3/uL (ref 0.00–0.07)
Basophils Absolute: 0 10*3/uL (ref 0.0–0.1)
Basophils Relative: 0 %
Eosinophils Absolute: 0.1 10*3/uL (ref 0.0–0.5)
Eosinophils Relative: 1 %
HCT: 39.7 % (ref 36.0–46.0)
Hemoglobin: 12.5 g/dL (ref 12.0–15.0)
Immature Granulocytes: 0 %
Lymphocytes Relative: 17 %
Lymphs Abs: 1.3 10*3/uL (ref 0.7–4.0)
MCH: 26.8 pg (ref 26.0–34.0)
MCHC: 31.5 g/dL (ref 30.0–36.0)
MCV: 85 fL (ref 80.0–100.0)
Monocytes Absolute: 0.7 10*3/uL (ref 0.1–1.0)
Monocytes Relative: 10 %
Neutro Abs: 5.4 10*3/uL (ref 1.7–7.7)
Neutrophils Relative %: 72 %
Platelets: 260 10*3/uL (ref 150–400)
RBC: 4.67 MIL/uL (ref 3.87–5.11)
RDW: 14.9 % (ref 11.5–15.5)
WBC: 7.5 10*3/uL (ref 4.0–10.5)
nRBC: 0 % (ref 0.0–0.2)

## 2021-02-12 LAB — GLUCOSE, CAPILLARY
Glucose-Capillary: 103 mg/dL — ABNORMAL HIGH (ref 70–99)
Glucose-Capillary: 119 mg/dL — ABNORMAL HIGH (ref 70–99)
Glucose-Capillary: 181 mg/dL — ABNORMAL HIGH (ref 70–99)
Glucose-Capillary: 176 mg/dL — ABNORMAL HIGH (ref 70–99)

## 2021-02-12 MED ORDER — SODIUM CHLORIDE 0.9 % IV SOLN
2.0000 g | Freq: Three times a day (TID) | INTRAVENOUS | Status: DC
  Administered 2021-02-12 – 2021-02-13 (×3): 2 g via INTRAVENOUS
  Filled 2021-02-12 (×3): qty 2

## 2021-02-12 MED ORDER — VANCOMYCIN HCL 1000 MG/200ML IV SOLN
1000.0000 mg | INTRAVENOUS | Status: DC
  Administered 2021-02-12: 1000 mg via INTRAVENOUS
  Filled 2021-02-12 (×2): qty 200

## 2021-02-12 NOTE — Progress Notes (Signed)
Pt refused am labs. CBC ordered. None obtained since 02/09/2021.Notified MD

## 2021-02-12 NOTE — Progress Notes (Signed)
PT Cancellation Note  Patient Details Name: Tiffany [name] MRN: [id number] DOB: July 16, 1951   Cancelled Treatment:    Reason Eval/Treat Not Completed: Other (comment) Noted orders to begin therapy on 02/14/21, but also pt too confused to participate today. There is plan for revascularization and toe amputation tomorrow.  Will f/u as able.  Abran Richard, PT Acute Rehab Services Pager [phone] [facility] [place] 02/12/2021, 3:04 PM

## 2021-02-12 NOTE — H&P (View-Only) (Signed)
  Progress Note    02/12/2021 7:23 AM 4 Days Post-Op  Subjective:  sleeping   Tm 100.8  Vitals:   02/12/21 0011 02/12/21 0506  BP: (!) 178/91 (!) 158/84  Pulse: 61 93  Resp: 19 20  Temp: 98.3 F (36.8 C) (!) 100.8 F (38.2 C)  SpO2: 93% 100%    Physical Exam: General:  sleeping soundly Lungs:  non labored Extremities:  right foot wrapped with kerlix; malodorous   CBC    Component Value Date/Time   WBC 5.0 02/09/2021 0108   RBC 4.11 02/09/2021 0108   HGB 11.0 (L) 02/09/2021 0108   HGB 13.6 05/15/2015 1108   HCT 35.8 (L) 02/09/2021 0108   HCT 42.2 05/15/2015 1108   PLT 222 02/09/2021 0108   PLT 280 05/15/2015 1108   MCV 87.1 02/09/2021 0108   MCV 84 05/15/2015 1108   MCH 26.8 02/09/2021 0108   MCHC 30.7 02/09/2021 0108   RDW 15.0 02/09/2021 0108   RDW 15.4 05/15/2015 1108   LYMPHSABS 1.2 02/09/2021 0108   LYMPHSABS 1.6 05/15/2015 1108   MONOABS 0.4 02/09/2021 0108   EOSABS 0.1 02/09/2021 0108   EOSABS 0.1 05/15/2015 1108   BASOSABS 0.0 02/09/2021 0108   BASOSABS 0.0 05/15/2015 1108    BMET    Component Value Date/Time   NA 139 02/10/2021 0124   NA 143 05/15/2015 1108   K 4.0 02/10/2021 0124   CL 106 02/10/2021 0124   CO2 26 02/10/2021 0124   GLUCOSE 123 (H) 02/10/2021 0124   BUN 19 02/10/2021 0124   BUN 13 05/15/2015 1108   CREATININE 0.94 02/10/2021 0124   CALCIUM 8.6 (L) 02/10/2021 0124   GFRNONAA >60 02/10/2021 0124   GFRAA >60 12/20/2017 1529    INR    Component Value Date/Time   INR 1.0 02/06/2021 0943     Intake/Output Summary (Last 24 hours) at 02/12/2021 0723 Last data filed at 02/12/2021 0518 Gross per 24 hour  Intake 297.34 ml  Output 1120 ml  Net -822.66 ml     Assessment/Plan:  70 y.o. female is s/p:  Angiogram showed flush occlusion of left SFA. Recommendation has been revascularization with left fem- below knee popliteal artery bypass  4 Days Post-Op   -pt sleeping and I did not wake her.   -pt for LLE bypass & toe  amputation tomorrow with Dr. Trula Slade.  Pt febrile this am -stress test was recommended, however, pt and daughter refused.  Per cardiology, ok to proceed with surgery & close monitoring as she does not have known disease & not felt to be high risk. -DVT prophylaxis:  sq heparin   Leontine Locket, PA-C [facility] [phone] 02/12/2021 7:23 AM  I agree with the above.  I have seen and evaluated the patient.  We discussed proceeding with left femoral to below-knee popliteal bypass graft with vein as well as left fifth toe amputation tomorrow.  I went over the details of the procedure and the risks and benefits.  All questions were answered.  She will be n.p.o. after midnight.  Annamarie Major

## 2021-02-12 NOTE — Progress Notes (Signed)
Patient setting at bottom of bed. Little toe on left foot scant amt. Of bleeding and skin is open. Asked patient what happened and patient stated,"I do not know how it happen. "Cleaned and applied gauze dressing.

## 2021-02-12 NOTE — Progress Notes (Signed)
HD#6 Subjective:  Overnight Events: Temp of 100.8 overnight.  Patient refusing labs, nursing staff note patient is confused  [name] was resting comfortably in bed.  States that she slept well.  She denies any worsening pain of her left fifth toe.  When asked what brought her into the hospital she states her toe.  She denied lab draws this morning as she states she is tired of being poked in the mornings.  She denies any chills overnight or feeling feverish.  States she is eating and drinking well.  Denies any burning with urination.  Discussed that we will avoid starting antibiotics as her toe seems to be having worsening infection.  Patient has no other questions or concerns at this time.  Objective:  Vital signs in last 24 hours: Vitals:   02/11/21 1617 02/11/21 2100 02/12/21 0011 02/12/21 0506  BP: (!) 141/99 (!) 169/96 (!) 178/91 (!) 158/84  Pulse:  94 61 93  Resp: 19 20 19 20   Temp: 98.7 F (37.1 C) 98.3 F (36.8 C) 98.3 F (36.8 C) (!) 100.8 F (38.2 C)  TempSrc: Oral Oral Oral Axillary  SpO2: 97% 96% 93% 100%  Weight:    91.8 kg  Height:       Supplemental O2: Saturating well on room air Physical Exam:  Constitutional: In no acute distress, resting comfortably HENT: normocephalic atraumatic Eyes: conjunctiva non-erythematous Neck: supple Cardiovascular: regular rate and rhythm, no m/r/g Pulmonary/Chest: normal work of breathing on room air MSK: normal bulk and tone Neurological: alert & oriented x 3 Skin: warm and dry.  Dry gangrenous left fifth toe.  Erythema at base of left fifth toe with pustular fluid draining from the toe. Psych: Flat affect  Filed Weights   02/10/21 0329 02/11/21 0347 02/12/21 0506  Weight: 96.6 kg 94.9 kg 91.8 kg     Intake/Output Summary (Last 24 hours) at 02/12/2021 0717 Last data filed at 02/12/2021 0518 Gross per 24 hour  Intake 297.34 ml  Output 1120 ml  Net -822.66 ml    Net IO Since Admission: -2,911.88 mL [02/12/21  0717]  Pertinent Labs: CBC Latest Ref Rng & Units 02/09/2021 02/08/2021 02/07/2021  WBC 4.0 - 10.5 K/uL 5.0 4.5 5.2  Hemoglobin 12.0 - 15.0 g/dL 11.0(L) 11.2(L) 11.6(L)  Hematocrit 36.0 - 46.0 % 35.8(L) 34.9(L) 37.5  Platelets 150 - 400 K/uL 222 215 202    CMP Latest Ref Rng & Units 02/10/2021 02/08/2021 02/07/2021  Glucose 70 - 99 mg/dL 123(H) 92 97  BUN 8 - 23 mg/dL 19 14 18   Creatinine 0.44 - 1.00 mg/dL 0.94 0.76 0.91  Sodium 135 - 145 mmol/L 139 141 141  Potassium 3.5 - 5.1 mmol/L 4.0 3.3(L) 3.5  Chloride 98 - 111 mmol/L 106 104 106  CO2 22 - 32 mmol/L 26 26 27   Calcium 8.9 - 10.3 mg/dL 8.6(L) 8.6(L) 8.5(L)  Total Protein 6.5 - 8.1 g/dL - - -  Total Bilirubin 0.3 - 1.2 mg/dL - - -  Alkaline Phos 38 - 126 U/L - - -  AST 15 - 41 U/L - - -  ALT 0 - 44 U/L - - -    Imaging: No results found.  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 with a pertinent PMH of type 2 diabetes, tobacco use disorder, hypertension, who presented with pain of left fifth toe and admitted for dry gangrenous fifth left toe   Dry gangrene, left fifth toe PAD Patient with severe peripheral artery disease, angiogram revealed occlusion of left superficial femoral artery.  Vascular surgery following and recommend revascularization with left fem below knee popliteal artery bypass.  Vein mapping revealed adequate vein conduit for bypass. Cardiology believe patient is safe to proceed with bypass and amputation of left fifth toe without need for further cardiac workup. Patient to be made NPO at midnight, plan for procedure tomorrow.  Patient also febrile overnight, with increased amounts of erythema and pustules of her left fifth toe.  We will restart  antibiotics, vancomycin and cefepime in hopes to limit the spread of infection. -Appreciate cardiology, vascular and orthopedic surgeries assistance -CBC pending -Plan for OR for bypass/left 5th toe amputation tomorrow -Vancomycin and cefepime per pharmacy -Pain control - Tylenol 650 mg q6h PRN for mild to moderate pain, Percocet 5-325 mg q4h PRN for severe pain -Continue high intensity statin, Atorvastatin 40 mg daily -Continue daily aspirin 81 mg -NPO at midnight -PT/OT postop   Hypertension Losartan added to patient's antihypertensive medications yesterday. Will continue to monitor, BP of 158/84 this am.  If no improvement in the next few days, plan to increase losartan to 50 mg. -Amlodipine 10 mg daily -Losartan 25 mg daily  Type 2 diabetes Hx ot T2DM, untreated prior to admission. A1c during admission of 6.1%. CBGs are within goal during hospitalization thus far. -Continue SSI   Tobacco use disorder Patient with extensive history of tobacco use. Patient is interested in quitting and will plan to start Chantix on discharge.  Diet: Carb-Modified IVF: None,None VTE: Heparin Code: Full PT/OT recs: Pending, postop TOC recs: None at this time  Dispo: Anticipated discharge to Home pending surgical amputation tomorrow a.m. postop care  Sanjuana Letters DO Internal Medicine Resident PGY-1 Pager [phone] Please contact the on call pager after 5 pm and on weekends at [phone].

## 2021-02-12 NOTE — Progress Notes (Signed)
Pharmacy Antibiotic Note  Tiffany [name] is a 70 y.o. female admitted on 02/05/2021 with foot pain, found to have left 5th toe osteomyelitis with gangrene.  Pharmacy has been consulted for vancomycin dosing.  Patient is also on Rocephin and Flagyl.  Pt has got a few days of vanc before they were dced. Plan for amputation in AM. She spiked a temp of 100.8 so abx were ordered to be restarted.   Scr 0.94 Wbc wnl  Plan: Restarting vanc 1g IV q24 AUC 524 scr 0.94 Restart Cefepime 2g IV q8  Height: 5\' 2"  (157.5 cm) Weight: 91.8 kg (202 lb 6.1 oz) IBW/kg (Calculated) : 50.1  Temp (24hrs), Avg:99 F (37.2 C), Min:98 F (36.7 C), Max:100.8 F (38.2 C)  Recent Labs  Lab 02/05/21 2125 02/06/21 0850 02/07/21 0358 02/08/21 0301 02/09/21 0108 02/10/21 0124  WBC 6.6  --  5.2 4.5 5.0  --   CREATININE 0.85  --  0.91 0.76  --  0.94  LATICACIDVEN  --  1.3  --   --   --   --      Estimated Creatinine Clearance: 59.6 mL/min (by C-G formula based on SCr of 0.94 mg/dL).    Allergies  Allergen Reactions   Cymbalta [Duloxetine Hcl] Other (See Comments)    Sweating, bad dreams,   Vanc 6/15 >> 6/17 6/21>> Cefepime x1 6/15 6/21>> CTX 6/16 >> 6/17 Flagyl 6/15 >> 6/16  6/15 BCx - NGTDF  Onnie Boer, PharmD, BCIDP, AAHIVP, CPP Infectious Disease Pharmacist 02/12/2021 9:37 AM

## 2021-02-12 NOTE — Progress Notes (Addendum)
  Progress Note    02/12/2021 7:23 AM 4 Days Post-Op  Subjective:  sleeping   Tm 100.8  Vitals:   02/12/21 0011 02/12/21 0506  BP: (!) 178/91 (!) 158/84  Pulse: 61 93  Resp: 19 20  Temp: 98.3 F (36.8 C) (!) 100.8 F (38.2 C)  SpO2: 93% 100%    Physical Exam: General:  sleeping soundly Lungs:  non labored Extremities:  right foot wrapped with kerlix; malodorous   CBC    Component Value Date/Time   WBC 5.0 02/09/2021 0108   RBC 4.11 02/09/2021 0108   HGB 11.0 (L) 02/09/2021 0108   HGB 13.6 05/15/2015 1108   HCT 35.8 (L) 02/09/2021 0108   HCT 42.2 05/15/2015 1108   PLT 222 02/09/2021 0108   PLT 280 05/15/2015 1108   MCV 87.1 02/09/2021 0108   MCV 84 05/15/2015 1108   MCH 26.8 02/09/2021 0108   MCHC 30.7 02/09/2021 0108   RDW 15.0 02/09/2021 0108   RDW 15.4 05/15/2015 1108   LYMPHSABS 1.2 02/09/2021 0108   LYMPHSABS 1.6 05/15/2015 1108   MONOABS 0.4 02/09/2021 0108   EOSABS 0.1 02/09/2021 0108   EOSABS 0.1 05/15/2015 1108   BASOSABS 0.0 02/09/2021 0108   BASOSABS 0.0 05/15/2015 1108    BMET    Component Value Date/Time   NA 139 02/10/2021 0124   NA 143 05/15/2015 1108   K 4.0 02/10/2021 0124   CL 106 02/10/2021 0124   CO2 26 02/10/2021 0124   GLUCOSE 123 (H) 02/10/2021 0124   BUN 19 02/10/2021 0124   BUN 13 05/15/2015 1108   CREATININE 0.94 02/10/2021 0124   CALCIUM 8.6 (L) 02/10/2021 0124   GFRNONAA >60 02/10/2021 0124   GFRAA >60 12/20/2017 1529    INR    Component Value Date/Time   INR 1.0 02/06/2021 0943     Intake/Output Summary (Last 24 hours) at 02/12/2021 0723 Last data filed at 02/12/2021 0518 Gross per 24 hour  Intake 297.34 ml  Output 1120 ml  Net -822.66 ml     Assessment/Plan:  70 y.o. female is s/p:  Angiogram showed flush occlusion of left SFA. Recommendation has been revascularization with left fem- below knee popliteal artery bypass  4 Days Post-Op   -pt sleeping and I did not wake her.   -pt for LLE bypass & toe  amputation tomorrow with Dr. Trula Slade.  Pt febrile this am -stress test was recommended, however, pt and daughter refused.  Per cardiology, ok to proceed with surgery & close monitoring as she does not have known disease & not felt to be high risk. -DVT prophylaxis:  sq heparin   Leontine Locket, PA-C [facility] [phone] 02/12/2021 7:23 AM  I agree with the above.  I have seen and evaluated the patient.  We discussed proceeding with left femoral to below-knee popliteal bypass graft with vein as well as left fifth toe amputation tomorrow.  I went over the details of the procedure and the risks and benefits.  All questions were answered.  She will be n.p.o. after midnight.  Annamarie Major

## 2021-02-12 NOTE — Evaluation (Signed)
Occupational Therapy Evaluation Patient Details Name: Tiffany [name] MRN: [id number] DOB: 04/07/1951 Today's Date: 02/12/2021    History of Present Illness 70 y.o. female who presented with left fifth toe gangrene and osteomyelitis. Pt initially underwent abdominal aortogram of L LE on 6/17. Planned L fem-popliteal bypass and L 5th toe amputation on 6/21. PMH: DM II, GERD, HTN, L TKA.   Clinical Impression   Per nursing staff, pt ambulatory over the weekend and cognition WFL. However, pt noted with new confusion, as well as self-limiting behaviors. Pt refuses to answer orientation questions and remains silent when asked about PLOF. Pt able to follow one-step commands but would not initiate movement to assist in getting EOB. Noted plan for amputation tomorrow and anticipated WB precautions that will further impact ADL/mobility status if pt remains confused. Recommend DC to SNF for short term rehab at this time. Will follow-up to re-assess post-op.     Follow Up Recommendations  SNF;Supervision/Assistance - 24 hour    Equipment Recommendations  3 in 1 bedside commode    Recommendations for Other Services       Precautions / Restrictions Precautions Precautions: Fall Restrictions Weight Bearing Restrictions: No Other Position/Activity Restrictions: likely will have WB precautions s/p sx 6/22      Mobility Bed Mobility Overal bed mobility: Needs Assistance             General bed mobility comments: When initially attempting bed mobility to EOB, pt Total A with no initiation to assist. Then when reported need for BSC use, pt able to demo rolling to sidelying but ultimately did not get EOB    Transfers                 General transfer comment: pt refused    Balance Overall balance assessment:  (to be further assessed)                                         ADL either performed or assessed with clinical judgement   ADL Overall ADL's : Needs  assistance/impaired Eating/Feeding: Minimal assistance;Bed level Eating/Feeding Details (indicate cue type and reason): RN assisting with taking meds and drinking from cup Grooming: Moderate assistance;Bed level Grooming Details (indicate cue type and reason): initially reports desire for warm washcloth to wash face but then reports she did not ask for that, refused to initiate task Upper Body Bathing: Moderate assistance;Bed level   Lower Body Bathing: Total assistance;Sit to/from stand;Sitting/lateral leans;Bed level   Upper Body Dressing : Moderate assistance;Bed level   Lower Body Dressing: Total assistance;Sit to/from stand;Bed level       Toileting- Clothing Manipulation and Hygiene: Total assistance;Bed level         General ADL Comments: Greatly limited by cognition and/or self limiting behaviors. Had reported need to use bathroom, agreeable for [facility] use but then upon initiating bed mobility, stopped during task then reported feeling went away     Vision Patient Visual Report: No change from baseline Vision Assessment?: No apparent visual deficits     Perception     Praxis      Pertinent Vitals/Pain Pain Assessment: No/denies pain     Hand Dominance Right (Both)   Extremity/Trunk Assessment Upper Extremity Assessment Upper Extremity Assessment: Overall WFL for tasks assessed   Lower Extremity Assessment Lower Extremity Assessment: Defer to PT evaluation   Cervical / Trunk Assessment Cervical / Trunk  Assessment: Kyphotic   Communication Communication Communication: No difficulties   Cognition Arousal/Alertness: Awake/alert Behavior During Therapy: Flat affect Overall Cognitive Status: No family/caregiver present to determine baseline cognitive functioning Area of Impairment: Orientation;Attention;Memory;Following commands;Awareness;Safety/judgement;Problem solving                 Orientation Level: Disoriented to;Place;Time;Situation Current  Attention Level: Sustained Memory: Decreased short-term memory Following Commands: Follows one step commands with increased time Safety/Judgement: Decreased awareness of safety;Decreased awareness of deficits Awareness: Intellectual Problem Solving: Slow processing;Decreased initiation;Difficulty sequencing;Requires verbal cues;Requires tactile cues General Comments: Pt appears to attempt to mask cognitive impairments. When asked to state name of place, month, planned surgery, pt consistently defers to staff and reports they should know these things rather than asking her. Able to follow one step commands, self limiting behavior to participate in EOB tasks. would not answer PLOF information   General Comments  Per nursing, pt was ambulatory and less confused noted over the weekend. Had pulled out IVs, more confusion noted today. Coordinated with MD/resident team for OT eval pre-op vs post-op    Exercises     Shoulder Instructions      Home Living Family/patient expects to be discharged to:: Unsure                                        Prior Functioning/Environment Level of Independence: Needs assistance        Comments: Unsure as pt poor historian, also refused to answer PLOF questions. Noted ED visit 3/21 that pt was ambulatory with RW and had been living in hotel with daughter        OT Problem List: Decreased strength;Decreased activity tolerance;Impaired balance (sitting and/or standing);Decreased cognition;Decreased safety awareness;Decreased knowledge of use of DME or AE      OT Treatment/Interventions: Self-care/ADL training;Therapeutic exercise;DME and/or AE instruction;Therapeutic activities;Patient/family education;Balance training    OT Goals(Current goals can be found in the care plan section) Acute Rehab OT Goals Patient Stated Goal: go to sleep OT Goal Formulation: Patient unable to participate in goal setting Time For Goal Achievement:  02/26/21 Potential to Achieve Goals: Good ADL Goals Pt Will Perform Upper Body Bathing: with supervision;sitting Pt Will Perform Lower Body Bathing: with mod assist;sit to/from stand;sitting/lateral leans Pt Will Transfer to Toilet: with min guard assist;stand pivot transfer;bedside commode Pt Will Perform Toileting - Clothing Manipulation and hygiene: sitting/lateral leans;with mod assist;sit to/from stand Additional ADL Goal #1: Pt to sit EOB > 5 min during ADL tasks with no more than min guard needed to maintain balance  OT Frequency: Min 2X/week   Barriers to D/C:            Co-evaluation              AM-PAC OT "6 Clicks" Daily Activity     Outcome Measure Help from another person eating meals?: A Little Help from another person taking care of personal grooming?: A Lot Help from another person toileting, which includes using toliet, bedpan, or urinal?: Total Help from another person bathing (including washing, rinsing, drying)?: A Lot Help from another person to put on and taking off regular upper body clothing?: A Lot Help from another person to put on and taking off regular lower body clothing?: Total 6 Click Score: 11   End of Session Nurse Communication: Mobility status (RN present during session)  Activity Tolerance: Other (comment) (limited by cognition) Patient  left: in bed;with call bell/phone within reach;with bed alarm set;with nursing/sitter in room  OT Visit Diagnosis: Unsteadiness on feet (R26.81);Other abnormalities of gait and mobility (R26.89);Muscle weakness (generalized) (M62.81);Other symptoms and signs involving cognitive function                Time: 7125-2712 OT Time Calculation (min): 20 min Charges:  OT General Charges $OT Visit: 1 Visit OT Evaluation $OT Eval Moderate Complexity: 1 Mod  Malachy Chamber, OTR/L Acute Rehab Services Office: [phone]   Layla Maw 02/12/2021, 1:23 PM

## 2021-02-13 ENCOUNTER — Inpatient Hospital Stay (HOSPITAL_COMMUNITY): Payer: Medicare (Managed Care) | Admitting: Certified Registered Nurse Anesthetist

## 2021-02-13 ENCOUNTER — Encounter (HOSPITAL_COMMUNITY)
Admission: EM | Disposition: A | Discharge status: Skilled Nursing Facility | Payer: Self-pay | Source: Home / Self Care | Attending: Student in an Organized Health Care Education/Training Program

## 2021-02-13 DIAGNOSIS — I70262 Atherosclerosis of native arteries of extremities with gangrene, left leg: Secondary | ICD-10-CM

## 2021-02-13 DIAGNOSIS — S98132A Complete traumatic amputation of one left lesser toe, initial encounter: Secondary | ICD-10-CM

## 2021-02-13 DIAGNOSIS — L97529 Non-pressure chronic ulcer of other part of left foot with unspecified severity: Secondary | ICD-10-CM

## 2021-02-13 DIAGNOSIS — Z95828 Presence of other vascular implants and grafts: Secondary | ICD-10-CM

## 2021-02-13 HISTORY — PX: AMPUTATION: SHX166

## 2021-02-13 HISTORY — PX: FEMORAL-POPLITEAL BYPASS GRAFT: SHX937

## 2021-02-13 HISTORY — PX: VEIN HARVEST: SHX6363

## 2021-02-13 LAB — CBC
HCT: 39 % (ref 36.0–46.0)
Hemoglobin: 12.3 g/dL (ref 12.0–15.0)
MCH: 27.6 pg (ref 26.0–34.0)
MCHC: 31.5 g/dL (ref 30.0–36.0)
MCV: 87.6 fL (ref 80.0–100.0)
Platelets: 225 10*3/uL (ref 150–400)
RBC: 4.45 MIL/uL (ref 3.87–5.11)
RDW: 14.8 % (ref 11.5–15.5)
WBC: 7.2 10*3/uL (ref 4.0–10.5)
nRBC: 0 % (ref 0.0–0.2)

## 2021-02-13 LAB — GLUCOSE, CAPILLARY
Glucose-Capillary: 101 mg/dL — ABNORMAL HIGH (ref 70–99)
Glucose-Capillary: 89 mg/dL (ref 70–99)
Glucose-Capillary: 102 mg/dL — ABNORMAL HIGH (ref 70–99)
Glucose-Capillary: 144 mg/dL — ABNORMAL HIGH (ref 70–99)
Glucose-Capillary: 177 mg/dL — ABNORMAL HIGH (ref 70–99)
Glucose-Capillary: 181 mg/dL — ABNORMAL HIGH (ref 70–99)
Glucose-Capillary: 183 mg/dL — ABNORMAL HIGH (ref 70–99)

## 2021-02-13 LAB — TYPE AND SCREEN
ABO/RH(D): O POS
Antibody Screen: NEGATIVE

## 2021-02-13 LAB — BASIC METABOLIC PANEL
Anion gap: 11 (ref 5–15)
BUN: 26 mg/dL — ABNORMAL HIGH (ref 8–23)
CO2: 23 mmol/L (ref 22–32)
Calcium: 8.8 mg/dL — ABNORMAL LOW (ref 8.9–10.3)
Chloride: 104 mmol/L (ref 98–111)
Creatinine, Ser: 1.23 mg/dL — ABNORMAL HIGH (ref 0.44–1.00)
GFR, Estimated: 48 mL/min — ABNORMAL LOW (ref >60)
Glucose, Bld: 93 mg/dL (ref 70–99)
Potassium: 3.8 mmol/L (ref 3.5–5.1)
Sodium: 138 mmol/L (ref 135–145)

## 2021-02-13 LAB — POCT I-STAT 7, (LYTES, BLD GAS, ICA,H+H)
Acid-base deficit: 1 mmol/L (ref 0.0–2.0)
Bicarbonate: 24.1 mmol/L (ref 20.0–28.0)
Calcium, Ion: 1.17 mmol/L (ref 1.15–1.40)
HCT: 32 % — ABNORMAL LOW (ref 36.0–46.0)
Hemoglobin: 10.9 g/dL — ABNORMAL LOW (ref 12.0–15.0)
O2 Saturation: 99 %
Patient temperature: 36.2
Potassium: 4.1 mmol/L (ref 3.5–5.1)
Sodium: 141 mmol/L (ref 135–145)
TCO2: 25 mmol/L (ref 22–32)
pCO2 arterial: 39.8 mmHg (ref 32.0–48.0)
pH, Arterial: 7.386 (ref 7.350–7.450)
pO2, Arterial: 165 mmHg — ABNORMAL HIGH (ref 83.0–108.0)

## 2021-02-13 LAB — SURGICAL PCR SCREEN
MRSA, PCR: NEGATIVE
Staphylococcus aureus: NEGATIVE

## 2021-02-13 LAB — POCT ACTIVATED CLOTTING TIME
Activated Clotting Time: 242 seconds
Activated Clotting Time: 254 seconds

## 2021-02-13 SURGERY — BYPASS GRAFT FEMORAL-POPLITEAL ARTERY
Anesthesia: General | Site: Leg Upper | Laterality: Left

## 2021-02-13 MED ORDER — FENTANYL CITRATE (PF) 250 MCG/5ML IJ SOLN
INTRAMUSCULAR | Status: AC
  Filled 2021-02-13: qty 5

## 2021-02-13 MED ORDER — CHLORHEXIDINE GLUCONATE CLOTH 2 % EX PADS
6.0000 | MEDICATED_PAD | Freq: Every day | CUTANEOUS | Status: DC
  Administered 2021-02-13 – 2021-02-18 (×3): 6 via TOPICAL

## 2021-02-13 MED ORDER — ACETAMINOPHEN 160 MG/5ML PO SOLN: 325.0000 mg | ORAL | Status: DC | PRN

## 2021-02-13 MED ORDER — PROTAMINE SULFATE 10 MG/ML IV SOLN
INTRAVENOUS | Status: DC | PRN
  Administered 2021-02-13: 50 mg via INTRAVENOUS

## 2021-02-13 MED ORDER — DEXAMETHASONE SODIUM PHOSPHATE 10 MG/ML IJ SOLN
INTRAMUSCULAR | Status: DC | PRN
  Administered 2021-02-13: 5 mg via INTRAVENOUS

## 2021-02-13 MED ORDER — ALBUMIN HUMAN 5 % IV SOLN: INTRAVENOUS | Status: DC | PRN

## 2021-02-13 MED ORDER — PHENYLEPHRINE 40 MCG/ML (10ML) SYRINGE FOR IV PUSH (FOR BLOOD PRESSURE SUPPORT)
PREFILLED_SYRINGE | INTRAVENOUS | Status: AC
  Filled 2021-02-13: qty 20

## 2021-02-13 MED ORDER — SODIUM CHLORIDE 0.9 % IV SOLN
INTRAVENOUS | Status: AC
  Filled 2021-02-13: qty 1.2

## 2021-02-13 MED ORDER — GUAIFENESIN-DM 100-10 MG/5ML PO SYRP: 15.0000 mL | ORAL_SOLUTION | ORAL | Status: DC | PRN

## 2021-02-13 MED ORDER — LACTATED RINGERS IV SOLN: INTRAVENOUS | Status: DC | PRN

## 2021-02-13 MED ORDER — SODIUM CHLORIDE 0.9 % IV SOLN
INTRAVENOUS | Status: DC | PRN
  Administered 2021-02-13: 500 mL

## 2021-02-13 MED ORDER — OXYCODONE-ACETAMINOPHEN 5-325 MG PO TABS
1.0000 | ORAL_TABLET | ORAL | Status: DC | PRN
Start: 2021-02-13 — End: 2021-02-19
  Administered 2021-02-13: 1 via ORAL
  Administered 2021-02-14 – 2021-02-15 (×3): 2 via ORAL
  Administered 2021-02-17: 1 via ORAL
  Administered 2021-02-18 – 2021-02-19 (×3): 2 via ORAL
  Filled 2021-02-13: qty 1
  Filled 2021-02-13: qty 2
  Filled 2021-02-13: qty 1
  Filled 2021-02-13 (×5): qty 2

## 2021-02-13 MED ORDER — OXYCODONE HCL 5 MG/5ML PO SOLN: 5.0000 mg | Freq: Once | ORAL | Status: DC | PRN

## 2021-02-13 MED ORDER — CEFAZOLIN SODIUM-DEXTROSE 2-4 GM/100ML-% IV SOLN: 2.0000 g | Freq: Three times a day (TID) | INTRAVENOUS | Status: DC

## 2021-02-13 MED ORDER — ACETAMINOPHEN 10 MG/ML IV SOLN: 1000.0000 mg | Freq: Once | INTRAVENOUS | Status: DC | PRN

## 2021-02-13 MED ORDER — 0.9 % SODIUM CHLORIDE (POUR BTL) OPTIME
TOPICAL | Status: DC | PRN
  Administered 2021-02-13: 2000 mL

## 2021-02-13 MED ORDER — ROCURONIUM BROMIDE 10 MG/ML (PF) SYRINGE
PREFILLED_SYRINGE | INTRAVENOUS | Status: AC
  Filled 2021-02-13: qty 10

## 2021-02-13 MED ORDER — MIDAZOLAM HCL 2 MG/2ML IJ SOLN
INTRAMUSCULAR | Status: AC
  Filled 2021-02-13: qty 2

## 2021-02-13 MED ORDER — PHENOL 1.4 % MT LIQD: 1.0000 | OROMUCOSAL | Status: DC | PRN

## 2021-02-13 MED ORDER — SODIUM CHLORIDE 0.9 % IV SOLN
2.0000 g | Freq: Three times a day (TID) | INTRAVENOUS | Status: DC
  Administered 2021-02-13 – 2021-02-14 (×3): 2 g via INTRAVENOUS
  Filled 2021-02-13 (×3): qty 2

## 2021-02-13 MED ORDER — HEPARIN SODIUM (PORCINE) 1000 UNIT/ML IJ SOLN
INTRAMUSCULAR | Status: DC | PRN
  Administered 2021-02-13: 10000 [IU] via INTRAVENOUS

## 2021-02-13 MED ORDER — SODIUM CHLORIDE 0.9% IV SOLUTION: Freq: Once | INTRAVENOUS | Status: AC

## 2021-02-13 MED ORDER — ALUM & MAG HYDROXIDE-SIMETH 200-200-20 MG/5ML PO SUSP: 15.0000 mL | ORAL | Status: DC | PRN

## 2021-02-13 MED ORDER — MAGNESIUM SULFATE 2 GM/50ML IV SOLN: 2.0000 g | Freq: Every day | INTRAVENOUS | Status: DC | PRN

## 2021-02-13 MED ORDER — PROPOFOL 10 MG/ML IV BOLUS
INTRAVENOUS | Status: AC
  Filled 2021-02-13: qty 20

## 2021-02-13 MED ORDER — SUGAMMADEX SODIUM 200 MG/2ML IV SOLN
INTRAVENOUS | Status: DC | PRN
  Administered 2021-02-13: 200 mg via INTRAVENOUS

## 2021-02-13 MED ORDER — PHENYLEPHRINE HCL-NACL 10-0.9 MG/250ML-% IV SOLN
INTRAVENOUS | Status: DC | PRN
  Administered 2021-02-13: 25 ug/min via INTRAVENOUS

## 2021-02-13 MED ORDER — SODIUM CHLORIDE 0.9 % IV SOLN: 500.0000 mL | Freq: Once | INTRAVENOUS | Status: DC | PRN

## 2021-02-13 MED ORDER — PROTAMINE SULFATE 10 MG/ML IV SOLN
INTRAVENOUS | Status: AC
  Filled 2021-02-13: qty 5

## 2021-02-13 MED ORDER — PROMETHAZINE HCL 25 MG/ML IJ SOLN: 6.2500 mg | INTRAMUSCULAR | Status: DC | PRN

## 2021-02-13 MED ORDER — PROPOFOL 10 MG/ML IV BOLUS
INTRAVENOUS | Status: DC | PRN
  Administered 2021-02-13: 20 mg via INTRAVENOUS
  Administered 2021-02-13: 30 mg via INTRAVENOUS
  Administered 2021-02-13: 80 mg via INTRAVENOUS
  Administered 2021-02-13 (×2): 20 mg via INTRAVENOUS

## 2021-02-13 MED ORDER — FENTANYL CITRATE (PF) 250 MCG/5ML IJ SOLN
INTRAMUSCULAR | Status: DC | PRN
  Administered 2021-02-13: 100 ug via INTRAVENOUS
  Administered 2021-02-13: 50 ug via INTRAVENOUS
  Administered 2021-02-13 (×2): 25 ug via INTRAVENOUS
  Administered 2021-02-13 (×3): 50 ug via INTRAVENOUS

## 2021-02-13 MED ORDER — OXYCODONE HCL 5 MG PO TABS: 5.0000 mg | ORAL_TABLET | Freq: Once | ORAL | Status: DC | PRN

## 2021-02-13 MED ORDER — VANCOMYCIN HCL 1000 MG/200ML IV SOLN
1000.0000 mg | INTRAVENOUS | Status: DC
  Filled 2021-02-13: qty 200

## 2021-02-13 MED ORDER — HEMOSTATIC AGENTS (NO CHARGE) OPTIME
TOPICAL | Status: DC | PRN
  Administered 2021-02-13: 1 via TOPICAL

## 2021-02-13 MED ORDER — LABETALOL HCL 5 MG/ML IV SOLN
10.0000 mg | INTRAVENOUS | Status: DC | PRN
  Filled 2021-02-13: qty 4

## 2021-02-13 MED ORDER — METOPROLOL TARTRATE 5 MG/5ML IV SOLN: 2.0000 mg | INTRAVENOUS | Status: DC | PRN

## 2021-02-13 MED ORDER — HEPARIN SODIUM (PORCINE) 5000 UNIT/ML IJ SOLN
5000.0000 [IU] | Freq: Three times a day (TID) | INTRAMUSCULAR | Status: DC
  Administered 2021-02-14 – 2021-02-15 (×4): 5000 [IU] via SUBCUTANEOUS
  Filled 2021-02-13 (×4): qty 1

## 2021-02-13 MED ORDER — LABETALOL HCL 5 MG/ML IV SOLN
INTRAVENOUS | Status: AC
  Filled 2021-02-13: qty 4

## 2021-02-13 MED ORDER — DEXAMETHASONE SODIUM PHOSPHATE 10 MG/ML IJ SOLN
INTRAMUSCULAR | Status: AC
  Filled 2021-02-13: qty 1

## 2021-02-13 MED ORDER — HYDRALAZINE HCL 20 MG/ML IJ SOLN
5.0000 mg | INTRAMUSCULAR | Status: DC | PRN
  Filled 2021-02-13: qty 0.25

## 2021-02-13 MED ORDER — SODIUM CHLORIDE 0.9 % IV SOLN: INTRAVENOUS | Status: AC

## 2021-02-13 MED ORDER — CEFAZOLIN SODIUM 1 G IJ SOLR
INTRAMUSCULAR | Status: AC
  Filled 2021-02-13: qty 20

## 2021-02-13 MED ORDER — ESMOLOL HCL 100 MG/10ML IV SOLN
INTRAVENOUS | Status: DC | PRN
  Administered 2021-02-13 (×2): 20 mg via INTRAVENOUS

## 2021-02-13 MED ORDER — DOCUSATE SODIUM 100 MG PO CAPS
100.0000 mg | ORAL_CAPSULE | Freq: Every day | ORAL | Status: DC
  Administered 2021-02-14 – 2021-02-15 (×2): 100 mg via ORAL
  Filled 2021-02-13 (×3): qty 1

## 2021-02-13 MED ORDER — ONDANSETRON HCL 4 MG/2ML IJ SOLN
INTRAMUSCULAR | Status: DC | PRN
  Administered 2021-02-13: 4 mg via INTRAVENOUS

## 2021-02-13 MED ORDER — MORPHINE SULFATE (PF) 2 MG/ML IV SOLN
2.0000 mg | INTRAVENOUS | Status: DC | PRN
  Administered 2021-02-15: 2 mg via INTRAVENOUS
  Filled 2021-02-13: qty 1

## 2021-02-13 MED ORDER — ESMOLOL HCL 100 MG/10ML IV SOLN
INTRAVENOUS | Status: AC
  Filled 2021-02-13: qty 10

## 2021-02-13 MED ORDER — LIDOCAINE 2% (20 MG/ML) 5 ML SYRINGE
INTRAMUSCULAR | Status: AC
  Filled 2021-02-13: qty 5

## 2021-02-13 MED ORDER — FENTANYL CITRATE (PF) 100 MCG/2ML IJ SOLN: 25.0000 ug | INTRAMUSCULAR | Status: DC | PRN

## 2021-02-13 MED ORDER — ACETAMINOPHEN 325 MG PO TABS: 325.0000 mg | ORAL_TABLET | ORAL | Status: DC | PRN

## 2021-02-13 MED ORDER — POTASSIUM CHLORIDE CRYS ER 20 MEQ PO TBCR: 20.0000 meq | EXTENDED_RELEASE_TABLET | Freq: Every day | ORAL | Status: DC | PRN

## 2021-02-13 MED ORDER — PANTOPRAZOLE SODIUM 40 MG PO TBEC
40.0000 mg | DELAYED_RELEASE_TABLET | Freq: Every day | ORAL | Status: DC
  Administered 2021-02-13 – 2021-02-19 (×7): 40 mg via ORAL
  Filled 2021-02-13 (×7): qty 1

## 2021-02-13 MED ORDER — ROCURONIUM BROMIDE 10 MG/ML (PF) SYRINGE
PREFILLED_SYRINGE | INTRAVENOUS | Status: DC | PRN
  Administered 2021-02-13: 20 mg via INTRAVENOUS
  Administered 2021-02-13: 30 mg via INTRAVENOUS
  Administered 2021-02-13: 50 mg via INTRAVENOUS

## 2021-02-13 MED ORDER — PHENYLEPHRINE 40 MCG/ML (10ML) SYRINGE FOR IV PUSH (FOR BLOOD PRESSURE SUPPORT)
PREFILLED_SYRINGE | INTRAVENOUS | Status: DC | PRN
  Administered 2021-02-13: 40 ug via INTRAVENOUS
  Administered 2021-02-13: 80 ug via INTRAVENOUS

## 2021-02-13 MED ORDER — LIDOCAINE 2% (20 MG/ML) 5 ML SYRINGE
INTRAMUSCULAR | Status: DC | PRN
  Administered 2021-02-13: 40 mg via INTRAVENOUS

## 2021-02-13 MED ORDER — ONDANSETRON HCL 4 MG/2ML IJ SOLN
INTRAMUSCULAR | Status: AC
  Filled 2021-02-13: qty 2

## 2021-02-13 MED ORDER — HEPARIN SODIUM (PORCINE) 1000 UNIT/ML IJ SOLN
INTRAMUSCULAR | Status: AC
  Filled 2021-02-13: qty 1

## 2021-02-13 MED ORDER — LABETALOL HCL 5 MG/ML IV SOLN
INTRAVENOUS | Status: DC | PRN
  Administered 2021-02-13: 5 mg via INTRAVENOUS

## 2021-02-13 MED ORDER — CEFAZOLIN SODIUM-DEXTROSE 2-3 GM-%(50ML) IV SOLR
INTRAVENOUS | Status: DC | PRN
  Administered 2021-02-13: 2 g via INTRAVENOUS

## 2021-02-13 SURGICAL SUPPLY — 77 items
ADH SKN CLS APL DERMABOND .7 (GAUZE/BANDAGES/DRESSINGS) ×9
AGENT HMST KT MTR STRL THRMB (HEMOSTASIS) ×3
BANDAGE ESMARK 6X9 LF (GAUZE/BANDAGES/DRESSINGS) IMPLANT
BLADE AVERAGE 25MMX9MM (BLADE) ×1
BLADE AVERAGE 25X9 (BLADE) ×4 IMPLANT
BLADE SAW SGTL 81X20 HD (BLADE) IMPLANT
BNDG CMPR 9X6 STRL LF SNTH (GAUZE/BANDAGES/DRESSINGS) ×3
BNDG ELASTIC 4X5.8 VLCR STR LF (GAUZE/BANDAGES/DRESSINGS) ×5 IMPLANT
BNDG ESMARK 6X9 LF (GAUZE/BANDAGES/DRESSINGS) ×5
BNDG GAUZE ELAST 4 BULKY (GAUZE/BANDAGES/DRESSINGS) ×5 IMPLANT
CANISTER SUCT 3000ML PPV (MISCELLANEOUS) ×5 IMPLANT
CANNULA VESSEL 3MM 2 BLNT TIP (CANNULA) ×5 IMPLANT
CLIP VESOCCLUDE MED 24/CT (CLIP) ×5 IMPLANT
CLIP VESOCCLUDE SM WIDE 24/CT (CLIP) ×5 IMPLANT
COVER SURGICAL LIGHT HANDLE (MISCELLANEOUS) ×5 IMPLANT
COVER WAND RF STERILE (DRAPES) ×5 IMPLANT
CUFF TOURN SGL QUICK 24 (TOURNIQUET CUFF)
CUFF TOURN SGL QUICK 34 (TOURNIQUET CUFF) ×5
CUFF TOURN SGL QUICK 42 (TOURNIQUET CUFF) IMPLANT
CUFF TRNQT CYL 24X4X16.5-23 (TOURNIQUET CUFF) IMPLANT
CUFF TRNQT CYL 34X4.125X (TOURNIQUET CUFF) IMPLANT
DERMABOND ADVANCED (GAUZE/BANDAGES/DRESSINGS) ×6
DERMABOND ADVANCED .7 DNX12 (GAUZE/BANDAGES/DRESSINGS) ×3 IMPLANT
DRAIN CHANNEL 15F RND FF W/TCR (WOUND CARE) IMPLANT
DRAPE EXTREMITY T 121X128X90 (DISPOSABLE) ×3 IMPLANT
DRAPE HALF SHEET 40X57 (DRAPES) ×5 IMPLANT
DRAPE X-RAY CASS 24X20 (DRAPES) IMPLANT
ELECT CAUTERY BLADE 6.4 (BLADE) ×2 IMPLANT
ELECT REM PT RETURN 9FT ADLT (ELECTROSURGICAL) ×10
ELECTRODE REM PT RTRN 9FT ADLT (ELECTROSURGICAL) ×3 IMPLANT
EVACUATOR SILICONE 100CC (DRAIN) IMPLANT
GAUZE SPONGE 4X4 12PLY STRL (GAUZE/BANDAGES/DRESSINGS) ×5 IMPLANT
GAUZE XEROFORM 1X8 LF (GAUZE/BANDAGES/DRESSINGS) ×2 IMPLANT
GLOVE SURG POLYISO LF SZ7.5 (GLOVE) ×5 IMPLANT
GLOVE SURG UNDER POLY LF SZ7.5 (GLOVE) ×5 IMPLANT
GOWN STRL REUS W/ TWL LRG LVL3 (GOWN DISPOSABLE) ×6 IMPLANT
GOWN STRL REUS W/ TWL XL LVL3 (GOWN DISPOSABLE) ×3 IMPLANT
GOWN STRL REUS W/TWL LRG LVL3 (GOWN DISPOSABLE) ×10
GOWN STRL REUS W/TWL XL LVL3 (GOWN DISPOSABLE) ×10
HEMOSTAT SNOW SURGICEL 2X4 (HEMOSTASIS) IMPLANT
INSERT FOGARTY SM (MISCELLANEOUS) IMPLANT
KIT BASIN OR (CUSTOM PROCEDURE TRAY) ×5 IMPLANT
KIT TURNOVER KIT B (KITS) ×5 IMPLANT
MARKER GRAFT CORONARY BYPASS (MISCELLANEOUS) IMPLANT
NDL HYPO 25GX1X1/2 BEV (NEEDLE) IMPLANT
NEEDLE HYPO 25GX1X1/2 BEV (NEEDLE) IMPLANT
NS IRRIG 1000ML POUR BTL (IV SOLUTION) ×10 IMPLANT
PACK GENERAL/GYN (CUSTOM PROCEDURE TRAY) ×3 IMPLANT
PACK PERIPHERAL VASCULAR (CUSTOM PROCEDURE TRAY) ×5 IMPLANT
PAD ARMBOARD 7.5X6 YLW CONV (MISCELLANEOUS) ×10 IMPLANT
SET COLLECT BLD 21X3/4 12 (NEEDLE) IMPLANT
SPONGE LAP 18X18 RF (DISPOSABLE) ×4 IMPLANT
STOPCOCK 4 WAY LG BORE MALE ST (IV SETS) IMPLANT
SURGIFLO W/THROMBIN 8M KIT (HEMOSTASIS) ×2 IMPLANT
SUT ETHILON 3 0 PS 1 (SUTURE) ×7 IMPLANT
SUT GORETEX 6.0 TT13 (SUTURE) IMPLANT
SUT GORETEX 6.0 TT9 (SUTURE) IMPLANT
SUT PROLENE 5 0 C 1 24 (SUTURE) ×7 IMPLANT
SUT PROLENE 6 0 BV (SUTURE) ×9 IMPLANT
SUT PROLENE 7 0 BV 1 (SUTURE) IMPLANT
SUT SILK 2 0 SH (SUTURE) ×5 IMPLANT
SUT SILK 3 0 (SUTURE) ×10
SUT SILK 3-0 18XBRD TIE 12 (SUTURE) IMPLANT
SUT VIC AB 2-0 CT1 27 (SUTURE) ×10
SUT VIC AB 2-0 CT1 36 (SUTURE) ×4 IMPLANT
SUT VIC AB 2-0 CT1 TAPERPNT 27 (SUTURE) ×6 IMPLANT
SUT VIC AB 3-0 SH 27 (SUTURE) ×30
SUT VIC AB 3-0 SH 27X BRD (SUTURE) ×6 IMPLANT
SUT VIC AB 3-0 SH 27XBRD (SUTURE) IMPLANT
SUT VIC AB 4-0 PS2 18 (SUTURE) ×2 IMPLANT
SUT VICRYL 4-0 PS2 18IN ABS (SUTURE) ×10 IMPLANT
SYR CONTROL 10ML LL (SYRINGE) IMPLANT
TOWEL GREEN STERILE (TOWEL DISPOSABLE) ×10 IMPLANT
TRAY FOLEY MTR SLVR 16FR STAT (SET/KITS/TRAYS/PACK) ×5 IMPLANT
TUBING EXTENTION W/L.L. (IV SETS) IMPLANT
UNDERPAD 30X36 HEAVY ABSORB (UNDERPADS AND DIAPERS) ×5 IMPLANT
WATER STERILE IRR 1000ML POUR (IV SOLUTION) ×5 IMPLANT

## 2021-02-13 NOTE — Progress Notes (Addendum)
HD#7 Subjective:  Overnight Events: N.p.o. at midnight  Patient taken to the OR this morning.  She was evaluated postoperatively, daughter at bedside.  Patient without verbal responses but is able to open and close her eyes.  Nursing staff at this time noted acute change in mentation.  Patient was talking able to answer questions prior to this.  Rapid nurse presented to bedside, patient did respond to pain. Exclaiming "stop."  She then began answering yes or no questions.  She was able to follow commands.  Patient states that she was tired and did not feel like answering questions.  Objective:  Vital signs in last 24 hours: Vitals:   02/12/21 1147 02/12/21 1944 02/12/21 2346 02/13/21 0310  BP: (!) 141/95 116/76 128/77 (!) 130/91  Pulse: 81 68 75 75  Resp: 18 16 18 17   Temp: 99.2 F (37.3 C) 97.7 F (36.5 C) 98 F (36.7 C) 97.6 F (36.4 C)  TempSrc: Oral Oral Oral Oral  SpO2: 100% 100% 100% 100%  Weight:    92.7 kg  Height:       Supplemental O2: Saturating well on room air  Physical Exam:  Constitutional: In no acute distress, resting comfortably HENT: normocephalic atraumatic Eyes: conjunctiva non-erythematous.  Pinpoint pupils. Neck: supple Pulmonary/Chest: normal work of breathing on room air MSK: normal bulk and tone Neurological: alert & oriented x 3.  Muscle strength 5 out of 5 in upper extremities.  Patient able to move toes.  Sensation intact.  Patient is alert and oriented x3. Skin: warm and dry.  Left foot wrapped with bandage.  Surgical incision sites along medial aspect of left leg are clean dry and intact. Psych: Flat affect  Filed Weights   02/11/21 0347 02/12/21 0506 02/13/21 0310  Weight: 94.9 kg 91.8 kg 92.7 kg     Intake/Output Summary (Last 24 hours) at 02/13/2021 0653 Last data filed at 02/13/2021 0353 Gross per 24 hour  Intake 600 ml  Output --  Net 600 ml    Net IO Since Admission: -2,311.88 mL [02/13/21 0653]  Pertinent Labs: CBC Latest  Ref Rng & Units 02/13/2021 02/12/2021 02/09/2021  WBC 4.0 - 10.5 K/uL 7.2 7.5 5.0  Hemoglobin 12.0 - 15.0 g/dL 12.3 12.5 11.0(L)  Hematocrit 36.0 - 46.0 % 39.0 39.7 35.8(L)  Platelets 150 - 400 K/uL 225 260 222    CMP Latest Ref Rng & Units 02/13/2021 02/12/2021 02/10/2021  Glucose 70 - 99 mg/dL 93 106(H) 123(H)  BUN 8 - 23 mg/dL 26(H) 13 19  Creatinine 0.44 - 1.00 mg/dL 1.23(H) 0.90 0.94  Sodium 135 - 145 mmol/L 138 138 139  Potassium 3.5 - 5.1 mmol/L 3.8 3.7 4.0  Chloride 98 - 111 mmol/L 104 103 106  CO2 22 - 32 mmol/L 23 26 26   Calcium 8.9 - 10.3 mg/dL 8.8(L) 9.1 8.6(L)  Total Protein 6.5 - 8.1 g/dL - - -  Total Bilirubin 0.3 - 1.2 mg/dL - - -  Alkaline Phos 38 - 126 U/L - - -  AST 15 - 41 U/L - - -  ALT 0 - 44 U/L - - -    Imaging: No results found.  Assessment/Plan:   Principal Problem:   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 (HCC) Active Problems:   Preoperative cardiovascular examination   Hypertension   Tobacco abuse   Diabetes mellitus with peripheral vascular disease ([place])   Gangrene ([place])  [name] is a 70 y.o. with a pertinent PMH  of type 2 diabetes, tobacco use disorder, hypertension, who presented with pain of left fifth toe and admitted for dry gangrenous fifth left toe   Dry gangrene, left fifth toe s/p amputation of fifth  PAD s/p left femoral-popliteal artery bypass Patient postop day 0 of left fifth toe amputation and left femoral-popliteal artery bypass.  Patient tolerated the procedure well without any complications.  Surgical incision sites are clean dry and intact along medial aspect of left lower extremity.  Left foot is wrapped with dressings.  Patient was not responding verbally to nursing staff for a bit of time.  After frequent the asking patient to perform certain commands she eventually answered questions was able to follow them appropriately.  Do not believe patient is having an acute CVA at this time.  Suspect this is  secondary to anesthesia and pain medication she received during her procedure.  We will continue to monitor patient postoperatively.  Plan for additional day of antibiotics. -Appreciate vascular and orthopedic surgeries assistance -Continue vancomycin and cefepime per pharmacy -Pain control - Tylenol 650 mg q6h PRN for mild to moderate pain, Percocet 5-325 mg q4h PRN for severe pain -Continue high intensity statin, Atorvastatin 40 mg daily -Continue daily aspirin 81 mg -Advance diet to carb modified as tolerable -Restart heparin tomorrow morning -PT/OT recommendations pending   Hypertension Blood pressure of 144/88.  We will continue to monitor and increase losartan as needed. -Amlodipine 10 mg daily -Losartan 25 mg daily  Type 2 diabetes Hx ot T2DM, untreated prior to admission. A1c during admission of 6.1%. CBGs are within goal during hospitalization thus far. -Continue SSI   Tobacco use disorder Patient with extensive history of tobacco use. Patient is interested in quitting and will plan to start Chantix on discharge.  Diet: Carb-Modified IVF: None,None VTE: Heparin Code: Full PT/OT recs: Pending,  TOC recs: None at this time  Dispo: Anticipated discharge to Home pending postop care  [facility] Resident PGY-1 Pager [phone] Please contact the on call pager after 5 pm and on weekends at [phone].

## 2021-02-13 NOTE — Interval H&P Note (Signed)
History and Physical Interval Note:  02/13/2021 8:01 AM  Tiffany [name]  has presented today for surgery, with the diagnosis of PERIPHERAL ARTERY DISEASE WITH ULCER.  The various methods of treatment have been discussed with the patient and family. After consideration of risks, benefits and other options for treatment, the patient has consented to  Procedure(s): BYPASS GRAFT FEMORAL-POPLITEAL ARTERY (Left) FIFTH TOE AMPUTATION (Left) as a surgical intervention.  The patient's history has been reviewed, patient examined, no change in status, stable for surgery.  I have reviewed the patient's chart and labs.  Questions were answered to the patient's satisfaction.     Annamarie Major

## 2021-02-13 NOTE — Progress Notes (Signed)
Patient not responding to voice. Rapid Response notified. MD at bedside.  Era Bumpers, RN

## 2021-02-13 NOTE — Progress Notes (Signed)
Patient alert and oriented to consent for surgery with 2 nurses, CRNA, and Dr. Smith Robert at bedside.  Patient unable to sign consent form due to weakness in hands.  Verbal consent obtained.

## 2021-02-13 NOTE — Op Note (Signed)
Patient name: Tiffany [name] MRN: [id number] DOB: 04/01/1951 Sex: female  02/13/2021 Pre-operative Diagnosis: Left fifth toe ulcer in the setting of peripheral vascular disease Post-operative diagnosis:  Same Surgeon:  Annamarie Major Assistants: Curt Jews, Matt Eveland Procedure:   #1: Left common femoral to below-knee popliteal artery bypass graft with ipsilateral translocated nonreverse saphenous vein   Left fifth toe amputation including metatarsal head Anesthesia: General Blood Loss: 150 cc Specimens: None  Findings: Vein was of adequate caliber measuring 3 to 4 mm throughout.  The anastomosis to the common femoral artery was to the distal common femoral artery down onto the origin of the profundofemoral artery.  The distal anastomosis was to the below-knee popliteal artery which was calcified but had an adequate lumen.  The fifth toe was gangrenous.  We did get back to healthy bone after resecting the metatarsal head.  There was excellent bleeding at the amputation site.  There was a brisk graft dependent anterior tibial Doppler signal after bypass  Indications: This is a 70 year old female with gangrenous changes to the left fifth toe.  Angiography revealed occlusion of the superficial femoral artery at its origin, with reconstitution of the popliteal artery.  She comes in today for bypass and toe amputation.  Procedure:  The patient was identified in the holding area and taken to [place] [facility]  The patient was then placed supine on the table. general anesthesia was administered.  The patient was prepped and draped in the usual sterile fashion.  A time out was called and antibiotics were administered.  An assistant was necessary to expedite the procedure and assist with technical details.  Ultrasound was used to map the course of the saphenous vein.  It appeared to be of uniform caliber throughout measuring 3 to 4 mm.  First an oblique incision was made in the left groin.  Through this  incision the common femoral artery was exposed from the inguinal ligament down to the bifurcation.  The profunda and superficial femoral artery were individually isolated.  Next, a medial below-knee incision was made.  Cautery was used divide subcutaneous tissue down to the fascia.  The fascia was opened and the popliteal artery was exposed down to the anterior tibial artery.  Anterior tibial vein was not divided.  Through skip incisions, the saphenous vein was then harvested.  This was very deep under a lot of subcutaneous fat.  Multiple branches were ligated between silk ties.  The vein was of uniform caliber throughout measuring 3 to 4 mm.  Once the vein was fully isolated, it was divided by ligating it distally with a silk tie and proximally with a 2-0 silk tie.  The vein was prepared on the back table.  It distended nicely measuring 3 to 4 mm.  It was marked for orientation.  Next a tunnel was created between the groin incision and the below-knee incision with a long Gore tunneler.  The patient was then fully heparinized.  After the heparin circulated the common femoral and profundofemoral artery were occluded.  A #11 blade was used to make an arteriotomy in the distal common femoral artery that extended down onto the origin of the profundofemoral artery.  This was approximately a 1.5 cm arteriotomy.  The vein was then placed in Reverse fashion and spatulated to fit the size the arteriotomy.  A running end-to-side anastomosis was created with 5-0 Prolene.  Once this was completed, the clamps were released.  The anastomosis was hemostatic.  Next, a  Jerelene Redden valvulotome was used to lyse the valves.  2 passes were made.  After this, there was excellent pulsatile flow through the graft.  The vein graft was then brought through the previously created tunnel, making sure to maintain proper orientation.  Next a tourniquet was placed in the upper thigh and the leg was exsanguinated with an Esmarch.  The tourniquet was  taken to 250 mm of pressure.  A #11 blade was used to make an arteriotomy in the popliteal artery which extended longitudinally with Potts scissors.  The leg was straightened and the vein was cut to the appropriate length and spatulated to fit the size the arteriotomy.  The below-knee popliteal artery was calcified however there was appropriate lumen.  A end-to-side anastomosis was performed with 6-0 Prolene.  Prior to completion, the tourniquet was let down.  The appropriate flushing maneuvers were performed and the anastomosis was completed.  The patient had a triphasic anterior tibial Doppler signal which was graft dependent.  Next, the patient's heparin was reversed with 50 mg of protamine.  Hemostasis was achieved.  The vein harvest incisions were irrigated and then closed with 2 layers of Vicryl followed by subcuticular stitch.  The groin was closed by reapproximating the femoral sheath with 2-0 Vicryl.  Subcutaneous tissue was closed in multiple layers followed by subcuticular stitch.  The below-knee incision was closed by reapproximating the fascia with 2-0 Vicryl, the subcutaneous tissue with 2-0 Vicryl followed by subcuticular stitch.  Dermabond was applied.  Next attention was turned towards the fifth toe.  A racquet type incision was made on the lateral aspect of the foot incorporating the fifth toe which was gangrenous.  This was done with a 10 blade.  Bone cutters were used to transect the bone.  Rongeurs were then used to remove the metatarsal head.  I got back to healthy appearing bone.  There was pulsatile bleeding from the digital arteries.  There was no obvious purulence.  The incision was irrigated.  Hemostasis was achieved.  A 3 oh mattress suture was placed laterally on the foot and the remaining portion of the toe amputation site was left open and dressed with a wet gauze and sterile dressings.  The patient tolerated the procedure well was extubated taken to recovery in stable  condition.   Disposition: To PACU stable.   Theotis Burrow, M.D., [facility] [place] Office: [phone] Pager:  [phone]

## 2021-02-13 NOTE — Anesthesia Preprocedure Evaluation (Addendum)
Anesthesia Evaluation  Patient identified by MRN, date of birth, ID band Patient awake    Reviewed: Allergy & Precautions, NPO status , Patient's Chart, lab work & pertinent test results  Airway Mallampati: II  TM Distance: >3 FB Neck ROM: Full    Dental  (+) Poor Dentition, Missing, Dental Advisory Given, Loose,    Pulmonary Current Smoker,    Pulmonary exam normal        Cardiovascular hypertension, Pt. on medications + Peripheral Vascular Disease  Normal cardiovascular exam     Neuro/Psych PSYCHIATRIC DISORDERS Anxiety    GI/Hepatic Neg liver ROS, GERD  Medicated,  Endo/Other  diabetes  Renal/GU Renal InsufficiencyRenal disease     Musculoskeletal  (+) Arthritis ,   Abdominal Normal abdominal exam  (+)   Peds  Hematology   Anesthesia Other Findings   Reproductive/Obstetrics                           Anesthesia Physical Anesthesia Plan  ASA: 2  Anesthesia Plan: General   Post-op Pain Management:    Induction: Intravenous  PONV Risk Score and Plan: 3 and Ondansetron, Dexamethasone and Treatment may vary due to age or medical condition  Airway Management Planned: Oral ETT  Additional Equipment: Arterial line  Intra-op Plan:   Post-operative Plan: Extubation in OR  Informed Consent: I have reviewed the patients History and Physical, chart, labs and discussed the procedure including the risks, benefits and alternatives for the proposed anesthesia with the patient or authorized representative who has indicated his/her understanding and acceptance.     Dental advisory given  Plan Discussed with: CRNA  Anesthesia Plan Comments:        Anesthesia Quick Evaluation

## 2021-02-13 NOTE — Anesthesia Postprocedure Evaluation (Signed)
Anesthesia Post Note  Patient: Courtnay A Haltiwanger  Procedure(s) Performed: BYPASS GRAFT LEFT FEMORAL-POPLITEAL ARTERY (Left: Leg Lower) FIFTH TOE AMPUTATION (Left: Foot) VEIN HARVEST LEFT GREATER SAPHENOUS VEIN (Left: Leg Upper)     Patient location during evaluation: PACU Anesthesia Type: General Level of consciousness: awake Pain management: pain level controlled Vital Signs Assessment: post-procedure vital signs reviewed and stable Respiratory status: spontaneous breathing, nonlabored ventilation, respiratory function stable and patient connected to nasal cannula oxygen Cardiovascular status: blood pressure returned to baseline and stable Postop Assessment: no apparent nausea or vomiting Anesthetic complications: yes Comments: Dental damage - Loose tooth removed during intubation process. No risk of airway compromise. Event discussed with Ms. Wenner and daughter, they were appreciative of care and thankful the tooth did not enter the airway.    No notable events documented.  Last Vitals:  Vitals:   02/13/21 1315 02/13/21 1330  BP: (!) 146/88 (!) 144/88  Pulse: 92 89  Resp: 13 16  Temp:  36.4 C  SpO2: 96% 98%    Last Pain:  Vitals:   02/13/21 1330  TempSrc:   PainSc: 0-No pain                 Effie Berkshire

## 2021-02-13 NOTE — Anesthesia Procedure Notes (Signed)
Procedure Name: Intubation Date/Time: 02/13/2021 8:19 AM Performed by: Effie Berkshire, MD Pre-anesthesia Checklist: Patient identified, Emergency Drugs available, Suction available and Patient being monitored Patient Re-evaluated:Patient Re-evaluated prior to induction Oxygen Delivery Method: Circle system utilized Preoxygenation: Pre-oxygenation with 100% oxygen Induction Type: IV induction Ventilation: Mask ventilation without difficulty Laryngoscope Size: Mac and 4 Grade View: Grade I Tube type: Oral Tube size: 7.5 mm Number of attempts: 1 Airway Equipment and Method: Stylet and Oral airway Placement Confirmation: ETT inserted through vocal cords under direct vision, positive ETCO2 and breath sounds checked- equal and bilateral Secured at: 21 cm Tube secured with: Tape Dental Injury: Teeth and Oropharynx as per pre-operative assessment  Comments: 1st Attempt by SRNA, loose tooth removed to avoid foreign body in airway.

## 2021-02-13 NOTE — Transfer of Care (Signed)
Immediate Anesthesia Transfer of Care Note  Patient: Tiffany [name]  Procedure(s) Performed: BYPASS GRAFT LEFT FEMORAL-POPLITEAL ARTERY (Left: Leg Lower) FIFTH TOE AMPUTATION (Left: Foot) VEIN HARVEST LEFT GREATER SAPHENOUS VEIN (Left: Leg Upper)  Patient Location: PACU  Anesthesia Type:General  Level of Consciousness: drowsy  Airway & Oxygen Therapy: Patient Spontanous Breathing and Patient connected to face mask oxygen  Post-op Assessment: Report given to RN and Post -op Vital signs reviewed and stable  Post vital signs: Reviewed and stable  Last Vitals:  Vitals Value Taken Time  BP 189/100 02/13/21 1231  Temp    Pulse 86 02/13/21 1237  Resp 21 02/13/21 1237  SpO2 100 % 02/13/21 1237  Vitals shown include unvalidated device data.  Last Pain:  Vitals:   02/13/21 0310  TempSrc: Oral  PainSc:       Patients Stated Pain Goal: 2 (72/15/87 2761)  Complications: No notable events documented.

## 2021-02-13 NOTE — Progress Notes (Signed)
Patient to room 4E22 from PACU. Vital signs obtained. Incisions clean, dry, and intact. Alert and oriented to room and call bell. Call bell within reach.  Era Bumpers, RN

## 2021-02-13 NOTE — Anesthesia Procedure Notes (Signed)
Arterial Line Insertion Start/End6/22/2022 8:20 AM, 02/13/2021 8:25 AM Performed by: Effie Berkshire, MD, anesthesiologist  Patient location: Pre-op. Preanesthetic checklist: patient identified, IV checked, site marked, risks and benefits discussed, surgical consent, monitors and equipment checked, pre-op evaluation, timeout performed and anesthesia consent Lidocaine 1% used for infiltration Left, radial was placed Catheter size: 20 Fr Hand hygiene performed  and maximum sterile barriers used   Attempts: 1 Procedure performed without using ultrasound guided technique. Following insertion, dressing applied and Biopatch. Post procedure assessment: normal and unchanged  Post procedure complications: second provider assisted. Patient tolerated the procedure well with no immediate complications.

## 2021-02-13 NOTE — Significant Event (Addendum)
Rapid Response Event Note   Reason for Call :  Pt not responding to voice or command, starring up and to the right.  Pt returned from procedure at 1350, at that time she was awake and responding appropriately to the RN. Upon reassessment, above symptoms were noted.  Initial Focused Assessment:  With noxious stimuli, pt aroused. She was moving all extremities appropriately, no drift noted. Gross motor was not tested to her LLE due to recent surgery, but she was able to wiggle her toes on her left foot. PERRLA, 25mm. EOMI. Face symmetrical. Pt disoriented to age and month. Following commands. Equal sensation in all extremities. No aphasia or dysarthria noted.   Pt endorsed being tired and stated she heard staff speaking to her prior to my arrival.   Pt again noted to stare with eyes midline, she blinked to threat. No nystagmus. RN asked pt if she was resting and she responded "yes".   No seizure or stroke history.   VS: T 97.49F, BP 144/88, HR 86, RR 17, SPO2 96% on room air CBG: 177  Interventions:  No intervention from [place] of Care:  -Frequent neuro and VS checks  Call rapid response for additional needs  Event Summary:  MD Notified: Dr. Johnney Ou at bedside Call Time: [place] Time: [place] Time: [place], RN

## 2021-02-14 ENCOUNTER — Encounter (HOSPITAL_COMMUNITY): Payer: Self-pay | Admitting: Surgery

## 2021-02-14 LAB — BASIC METABOLIC PANEL
Anion gap: 11 (ref 5–15)
BUN: 19 mg/dL (ref 8–23)
CO2: 22 mmol/L (ref 22–32)
Calcium: 8.6 mg/dL — ABNORMAL LOW (ref 8.9–10.3)
Chloride: 107 mmol/L (ref 98–111)
Creatinine, Ser: 0.75 mg/dL (ref 0.44–1.00)
GFR, Estimated: >60 mL/min (ref >60)
Glucose, Bld: 128 mg/dL — ABNORMAL HIGH (ref 70–99)
Potassium: 3.9 mmol/L (ref 3.5–5.1)
Sodium: 140 mmol/L (ref 135–145)

## 2021-02-14 LAB — GLUCOSE, CAPILLARY
Glucose-Capillary: 141 mg/dL — ABNORMAL HIGH (ref 70–99)
Glucose-Capillary: 139 mg/dL — ABNORMAL HIGH (ref 70–99)
Glucose-Capillary: 124 mg/dL — ABNORMAL HIGH (ref 70–99)
Glucose-Capillary: 221 mg/dL — ABNORMAL HIGH (ref 70–99)

## 2021-02-14 LAB — CBC
HCT: 32 % — ABNORMAL LOW (ref 36.0–46.0)
Hemoglobin: 10.2 g/dL — ABNORMAL LOW (ref 12.0–15.0)
MCH: 27 pg (ref 26.0–34.0)
MCHC: 31.9 g/dL (ref 30.0–36.0)
MCV: 84.7 fL (ref 80.0–100.0)
Platelets: 260 10*3/uL (ref 150–400)
RBC: 3.78 MIL/uL — ABNORMAL LOW (ref 3.87–5.11)
RDW: 14.7 % (ref 11.5–15.5)
WBC: 9.8 10*3/uL (ref 4.0–10.5)
nRBC: 0 % (ref 0.0–0.2)

## 2021-02-14 NOTE — Evaluation (Signed)
Occupational Therapy Re-Evaluation Patient Details Name: Tiffany [name] MRN: [id number] DOB: April 18, 1951 Today's Date: 02/14/2021    History of Present Illness 70 y.o. female who presented with left fifth toe gangrene and osteomyelitis. Pt initially underwent abdominal aortogram of L LE on 6/17.   6/22  S/p Left common femoaral to BK popliteal artery BPG and Left fifth toe amputation including metatarsal head. PMH: DM II, GERD, HTN, L TKA.   Clinical Impression   Pt seen for first OOB attempts since toe amputation. Pt's daughter present and assisting in clarifying home setup/PLOF info (noted below). Pt presents now with continued confusion, but pleasant and able to follow directions to participate. Pt requires Mod A x 2 for all general mobility, including pivot to chair using RW. With standing attempts, pt reports immediately fatigued and requests seated rest break. Plan to progress ability to sequence L LE with standing ADLs (difficulty manuevering w/ darco shoe) and improve overall strength/endurance. Continue to recommend SNF for short term rehab due to increased physical assistance required for ADLs/mobility.     Follow Up Recommendations  SNF;Supervision/Assistance - 24 hour    Equipment Recommendations  3 in 1 bedside commode    Recommendations for Other Services       Precautions / Restrictions Precautions Precautions: Fall Required Braces or Orthoses: Other Brace Other Brace: L darco shoe Restrictions Weight Bearing Restrictions: Yes Other Position/Activity Restrictions: no formal orders in place but assumed WB through L heel      Mobility Bed Mobility Overal bed mobility: Needs Assistance Bed Mobility: Supine to Sit     Supine to sit: Mod assist;+2 for safety/equipment;HOB elevated;+2 for physical assistance     General bed mobility comments: Increased cueing (tactile and verbal) to initaite movement, assist to get BLE to EOB. able to reach to bedrail to assist but  ultimately used handheld assist to lift trunk    Transfers Overall transfer level: Needs assistance Equipment used: Rolling walker (2 wheeled) Transfers: Sit to/from [facility] Sit to Stand: Mod assist;+2 physical assistance;+2 safety/equipment;From elevated surface Stand pivot transfers: Mod assist;+2 physical assistance;+2 safety/equipment       General transfer comment: Mod A x 2 for power up at bedside, repetition of hand placement and sequencing needed to transfer to chair. difficulty lifting LEs, especially L LE    Balance Overall balance assessment: Needs assistance Sitting-balance support: Feet supported;No upper extremity supported Sitting balance-Leahy Scale: Fair     Standing balance support: Bilateral upper extremity supported;During functional activity Standing balance-Leahy Scale: Poor Standing balance comment: reliant on BUE + external support                           ADL either performed or assessed with clinical judgement   ADL Overall ADL's : Needs assistance/impaired Eating/Feeding: Supervision/ safety;Sitting Eating/Feeding Details (indicate cue type and reason): though daughter assisting to feed pt lying in bed on entry. pt was able to demo picking up piece of bacon to feed self Grooming: Minimal assistance;Sitting   Upper Body Bathing: Moderate assistance;Sitting   Lower Body Bathing: Total assistance;Sit to/from stand   Upper Body Dressing : Moderate assistance;Sitting Upper Body Dressing Details (indicate cue type and reason): to don gown around back sitting EOB Lower Body Dressing: Total assistance;Sit to/from stand;Bed level Lower Body Dressing Details (indicate cue type and reason): Total A to don socks, darco shoe Toilet Transfer: Moderate assistance;+2 for safety/equipment;+2 for physical assistance;Stand-pivot;RW Toilet Transfer Details (indicate cue type  and reason): simulated to recliner Toileting- Clothing  Manipulation and Hygiene: Total assistance;Sit to/from stand         General ADL Comments: Continued confusion noted though unsure if baseline for pt or not. Decreased endurance with pt reporting fatigue with initial standing attempts     Vision Baseline Vision/History: Wears glasses Patient Visual Report: No change from baseline Vision Assessment?: No apparent visual deficits     Perception     Praxis      Pertinent Vitals/Pain Pain Assessment: Faces Faces Pain Scale: Hurts little more Pain Location: L foot Pain Descriptors / Indicators: Discomfort;Grimacing;Guarding Pain Intervention(s): Monitored during session     Hand Dominance Right (Both)   Extremity/Trunk Assessment Upper Extremity Assessment Upper Extremity Assessment: Generalized weakness   Lower Extremity Assessment Lower Extremity Assessment: Defer to PT evaluation LLE Deficits / Details: painful incisions, general weakness proximally, painful amp site with decrease ability to w/bear on foot in the [place].   Cervical / Trunk Assessment Cervical / Trunk Assessment: Kyphotic   Communication Communication Communication: No difficulties   Cognition Arousal/Alertness: Awake/alert Behavior During Therapy: Flat affect Overall Cognitive Status: No family/caregiver present to determine baseline cognitive functioning Area of Impairment: Orientation;Attention;Memory;Following commands;Awareness;Safety/judgement;Problem solving                 Orientation Level: Disoriented to;Time;Situation Current Attention Level: Sustained Memory: Decreased short-term memory Following Commands: Follows one step commands with increased time Safety/Judgement: Decreased awareness of safety;Decreased awareness of deficits Awareness: Intellectual Problem Solving: Slow processing;Decreased initiation;Difficulty sequencing;Requires verbal cues;Requires tactile cues General Comments: Pt able to state at [facility], unable  to recall month/year/date even when cued to use calendar and asked again later in session. Pleasantly confused, follows one step directions with repetition and multimodal cues   General Comments  VSS on entry. Pt's daughter present at start of eval but stepped out. Noted to come back to room after session, so PT/OT updated on functional status/recommendations    Exercises     Shoulder Instructions      Home Living Family/patient expects to be discharged to:: Private residence Living Arrangements: Children Available Help at Discharge: Family;Available 24 hours/day (pt's dtr states she is always with her mom.) Type of Home: Apartment Home Access: Level entry     Home Layout: One level     Bathroom Shower/Tub: Teacher, early years/pre: Standard     Home Equipment: Environmental consultant - 2 wheels          Prior Functioning/Environment Level of Independence: Needs assistance  Gait / Transfers Assistance Needed: walks with RW in apartment, daughter reports no falls ADL's / Homemaking Assistance Needed: Per daughter, she assists pt with "everything" when asked about ADLs. Difficulty with tub transfers and no shower chair to use. Assume pt capabale of completing UB ADLs. Daughter reports pt feeds self   Comments: Pt able to walk in her appartment with RW        OT Problem List: Decreased strength;Decreased activity tolerance;Impaired balance (sitting and/or standing);Decreased cognition;Decreased safety awareness;Decreased knowledge of use of DME or AE;Pain      OT Treatment/Interventions: Self-care/ADL training;Therapeutic exercise;DME and/or AE instruction;Therapeutic activities;Patient/family education;Balance training;Energy conservation    OT Goals(Current goals can be found in the care plan section) Acute Rehab OT Goals Patient Stated Goal: none specifically stated, but agreeable to attempt OOB tasks OT Goal Formulation: With patient/family Time For Goal Achievement:  02/26/21 Potential to Achieve Goals: Good  OT Frequency: Min 2X/week   Barriers to D/C:  Co-evaluation              AM-PAC OT "6 Clicks" Daily Activity     Outcome Measure Help from another person eating meals?: A Little Help from another person taking care of personal grooming?: A Little Help from another person toileting, which includes using toliet, bedpan, or urinal?: Total Help from another person bathing (including washing, rinsing, drying)?: A Lot Help from another person to put on and taking off regular upper body clothing?: A Lot Help from another person to put on and taking off regular lower body clothing?: Total 6 Click Score: 12   End of Session Equipment Utilized During Treatment: Gait belt;Rolling walker Nurse Communication: Mobility status;Other (comment);Weight bearing status (cognition)  Activity Tolerance: Patient tolerated treatment well Patient left: in chair;with call bell/phone within reach;with chair alarm set  OT Visit Diagnosis: Unsteadiness on feet (R26.81);Other abnormalities of gait and mobility (R26.89);Muscle weakness (generalized) (M62.81);Other symptoms and signs involving cognitive function;Pain Pain - Right/Left: Left Pain - part of body: Ankle and joints of foot                Time: 1062-6948 OT Time Calculation (min): 27 min Charges:  OT General Charges $OT Visit: 1 Visit OT Evaluation $OT Re-eval: 1 Re-eval  Malachy Chamber, OTR/L Acute Rehab Services Office: [phone]   Layla Maw 02/14/2021, 1:16 PM

## 2021-02-14 NOTE — Progress Notes (Signed)
HD#8 Subjective:  Overnight Events: Patient confused postoperatively, thought to be secondary to anesthesia  Ms. Dobbins is resting in bed, she states that she is not in an uncomfortable position.  She was unable to reposition herself, so she was assisted by our team. She notes feeling tired this morning and has not had breakfast yet as she has been so tired.  Denies difficulty breathing.  Discussed working with physical therapy as well as occupational therapy to assess her strength.  Objective:  Vital signs in last 24 hours: Vitals:   02/13/21 2007 02/13/21 2340 02/14/21 0443 02/14/21 0821  BP: (!) 143/89 (!) 148/81 (!) 149/76 130/68  Pulse: 85 91 96 80  Resp: 20 12 14 18   Temp: 98.5 F (36.9 C) 98.3 F (36.8 C) 98.3 F (36.8 C) 98.8 F (37.1 C)  TempSrc: Oral Oral Oral Oral  SpO2: 100% 100% 99% 100%  Weight:      Height:       Supplemental O2: Saturating well on room air  Physical Exam:  Constitutional: In no acute distress HENT: normocephalic atraumatic Eyes: conjunctiva non-erythematous.  Neck: supple Pulmonary/Chest: normal work of breathing on room air MSK: normal bulk and tone Neurological: alert & oriented x 3. Skin: warm and dry lower extremities. Left foot wrapped with bandage.  Surgical incision sites are clean dry and intact.  No ecchymosis or erythema appreciated.  No hematomas noted. Psych: Normal mood and thought process  Filed Weights   02/11/21 0347 02/12/21 0506 02/13/21 0310  Weight: 94.9 kg 91.8 kg 92.7 kg     Intake/Output Summary (Last 24 hours) at 02/14/2021 1054 Last data filed at 02/14/2021 2426 Gross per 24 hour  Intake 1000 ml  Output 1645 ml  Net -645 ml   Net IO Since Admission: -1,626.88 mL [02/14/21 1054]  Pertinent Labs: CBC Latest Ref Rng & Units 02/14/2021 02/13/2021 02/13/2021  WBC 4.0 - 10.5 K/uL 9.8 - 7.2  Hemoglobin 12.0 - 15.0 g/dL 10.2(L) 10.9(L) 12.3  Hematocrit 36.0 - 46.0 % 32.0(L) 32.0(L) 39.0  Platelets 150 - 400  K/uL 260 - 225    CMP Latest Ref Rng & Units 02/14/2021 02/13/2021 02/13/2021  Glucose 70 - 99 mg/dL 128(H) - 93  BUN 8 - 23 mg/dL 19 - 26(H)  Creatinine 0.44 - 1.00 mg/dL 0.75 - 1.23(H)  Sodium 135 - 145 mmol/L 140 141 138  Potassium 3.5 - 5.1 mmol/L 3.9 4.1 3.8  Chloride 98 - 111 mmol/L 107 - 104  CO2 22 - 32 mmol/L 22 - 23  Calcium 8.9 - 10.3 mg/dL 8.6(L) - 8.8(L)  Total Protein 6.5 - 8.1 g/dL - - -  Total Bilirubin 0.3 - 1.2 mg/dL - - -  Alkaline Phos 38 - 126 U/L - - -  AST 15 - 41 U/L - - -  ALT 0 - 44 U/L - - -    Imaging: No results found.  Assessment/Plan:   Principal Problem:   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 (HCC) Active Problems:   Preoperative cardiovascular examination   Hypertension   Tobacco abuse   Diabetes mellitus with peripheral vascular disease (HCC)   Gangrene (HCC)   S/P femoral-popliteal bypass surgery   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 ([place])  Patient Summary: FLARA STORTI is a 70 y.o. with a pertinent PMH of type 2 diabetes, tobacco use disorder, hypertension, who presented with pain of left fifth toe and admitted for dry gangrenous fifth left toe   Dry  gangrene, left fifth toe s/p amputation of fifth  PAD s/p left femoral-popliteal artery bypass Patient postop day 1 of left fifth toe amputation and left femoral-popliteal artery bypass.  Procedure went well without complications and irritation was taken back to healthy-appearing bone.  Surgical sites are clean dry and intact, left foot is wrapped in bandage. No signs of acute bleeding.  Lower extremities are warm to touch, no indication for decreased perfusion.  Today will be his last day of IV antibiotics.  Patient does appear to have significant deconditioning from her hospitalization, we will have her work with physical therapy as well as Occupational Therapy, appreciate their recommendations and assistance. -Appreciate vascular and orthopedic surgeries assistance -Last dose of  cefepime today, will discontinue after today's dose. -Pain control - Tylenol 650 mg q6h PRN for mild to moderate pain, Percocet 5-325 mg q4h PRN for severe pain -Continue high intensity statin, Atorvastatin 40 mg daily -Continue daily aspirin 81 mg -Advance diet to carb modified as tolerable -Restart heparin today -PT/OT recommendations pending   Hypertension Blood pressure of 130/68.  We will continue to monitor and increase losartan as needed. -Amlodipine 10 mg daily -Losartan 25 mg daily  Type 2 diabetes Hx ot T2DM, untreated prior to admission. A1c during admission of 6.1%. CBGs are within goal during hospitalization thus far. -Continue SSI   Tobacco use disorder Patient with extensive history of tobacco use.  Patient interested in quitting, will continue to discuss starting Chantix at discharge.  Diet: Carb-Modified IVF: None,None VTE: Heparin Code: Full PT/OT recs: Pending,  TOC recs: None at this time  Dispo: Anticipated discharge to Home pending postop care  [place] [facility] Resident PGY-1 Pager [phone] Please contact the on call pager after 5 pm and on weekends at [phone].

## 2021-02-14 NOTE — Progress Notes (Signed)
Orthopedic Tech Progress Note Patient Details:  Tiffany [name] 1950-11-15 [id number] Left shoe at bedside Ortho Devices Type of Ortho Device: Darco shoe Ortho Device/Splint Location: LLE Ortho Device/Splint Interventions: Ordered      Joanie Duprey A Leanette Eutsler 02/14/2021, 12:04 PM

## 2021-02-14 NOTE — Evaluation (Signed)
Physical Therapy Evaluation Patient Details Name: Tiffany [name] MRN: [id number] DOB: 21-May-1951 Today's Date: 02/14/2021   History of Present Illness  70 y.o. female who presented with left fifth toe gangrene and osteomyelitis. Pt initially underwent abdominal aortogram of L LE on 6/17.   6/22  S/p Left common femoaral to BK popliteal artery BPG and Left fifth toe amputation including metatarsal head. PMH: DM II, GERD, HTN, L TKA.  Clinical Impression  Pt admitted with/for surgical management of L fifth toe gangrene and osteo.  Pt now in a [place] and asked to keep w/bearing to partial on her heel.  She is needing mod assist of 1 or 2 people for basic mobility at this time..  Pt currently limited functionally due to the problems listed. ( See problems list.)   Pt will benefit from PT to maximize function and safety in order to get ready for next venue listed below.     Follow Up Recommendations SNF;Supervision/Assistance - 24 hour    Equipment Recommendations  Other (comment) (TBA)    Recommendations for Other Services       Precautions / Restrictions Precautions Precautions: Fall Required Braces or Orthoses: Other Brace Other Brace: L darco shoe Restrictions Weight Bearing Restrictions: Yes Other Position/Activity Restrictions: no formal orders in place but assumed WB through L heel      Mobility  Bed Mobility Overal bed mobility: Needs Assistance Bed Mobility: Supine to Sit     Supine to sit: Mod assist;+2 for safety/equipment;HOB elevated;+2 for physical assistance     General bed mobility comments: When initially attempting bed mobility to EOB, pt Total A with no initiation to assist. Then when reported need for BSC use, pt able to demo rolling to sidelying but ultimately did not get EOB    Transfers Overall transfer level: Needs assistance Equipment used: Rolling walker (2 wheeled)   Sit to Stand: Mod assist;+2 physical assistance Stand pivot transfers: Mod  assist;+2 physical assistance;+2 safety/equipment       General transfer comment: Mod A x 2 for power up at bedside, repetition of hand placement and sequencing needed to transfer to chair. difficulty lifting LEs, especially L LE  Ambulation/Gait                Stairs            Wheelchair Mobility    Modified Rankin (Stroke Patients Only)       Balance Overall balance assessment:  (to be further assessed) Sitting-balance support: Feet supported;No upper extremity supported Sitting balance-Leahy Scale: Fair     Standing balance support: Bilateral upper extremity supported;During functional activity Standing balance-Leahy Scale: Poor Standing balance comment: reliant on BUE + external support                             Pertinent Vitals/Pain Pain Assessment: Faces Faces Pain Scale: Hurts little more Pain Location: L foot Pain Descriptors / Indicators: Discomfort;Grimacing;Guarding Pain Intervention(s): Monitored during session    Home Living Family/patient expects to be discharged to:: Skilled nursing facility Living Arrangements: Children Available Help at Discharge: Family;Available 24 hours/day (pt's dtr states she is always with her mom.) Type of Home: Apartment Home Access: Level entry     Home Layout: One level Home Equipment: Walker - 2 wheels      Prior Function Level of Independence: Needs assistance   Gait / Transfers Assistance Needed: walks with RW in apartment, daughter reports no falls  ADL's / Homemaking Assistance Needed: Per daughter, she assists pt with "everything" when asked about ADLs. Difficulty with tub transfers and no shower chair to use. Assume pt capabale of completing UB ADLs. Daughter reports pt feeds self  Comments: Pt able to walk in her appartment with RW     Hand Dominance   Dominant Hand: Right (Both)    Extremity/Trunk Assessment   Upper Extremity Assessment Upper Extremity Assessment: Generalized  weakness    Lower Extremity Assessment Lower Extremity Assessment: Defer to PT evaluation LLE Deficits / Details: painful incisions, general weakness proximally, painful amp site with decrease ability to w/bear on foot in the [facility] shoe.    Cervical / Trunk Assessment Cervical / Trunk Assessment: Kyphotic  Communication   Communication: No difficulties  Cognition Arousal/Alertness: Awake/alert Behavior During Therapy: Flat affect Overall Cognitive Status: No family/caregiver present to determine baseline cognitive functioning Area of Impairment: Orientation;Attention;Memory;Following commands;Awareness;Safety/judgement;Problem solving                 Orientation Level: Disoriented to;Time;Situation Current Attention Level: Sustained Memory: Decreased short-term memory Following Commands: Follows one step commands with increased time Safety/Judgement: Decreased awareness of safety;Decreased awareness of deficits Awareness: Intellectual Problem Solving: Slow processing;Decreased initiation;Difficulty sequencing;Requires verbal cues;Requires tactile cues General Comments: Pt able to state at [facility], unable to recall month/year/date even when cued to use calendar and asked again later in session. Pleasantly confused, follows one step directions with repetition and multimodal cues      General Comments General comments (skin integrity, edema, etc.): VSS on entry. Pt's daughter present at start of eval but stepped out. Noted to come back to room after session, so PT/OT updated on functional status/recommendations    Exercises     Assessment/Plan    PT Assessment Patient needs continued PT services  PT Problem List Decreased strength;Decreased activity tolerance;Decreased balance;Decreased mobility;Decreased knowledge of use of DME;Pain       PT Treatment Interventions DME instruction;Gait training;Functional mobility training;Therapeutic activities;Balance  training;Patient/family education    PT Goals (Current goals can be found in the Care Plan section)  Acute Rehab PT Goals Patient Stated Goal: go to sleep PT Goal Formulation: With patient Time For Goal Achievement: 02/28/21 Potential to Achieve Goals: Good    Frequency Min 3X/week   Barriers to discharge        Co-evaluation PT/OT/SLP Co-Evaluation/Treatment: Yes Reason for Co-Treatment: For patient/therapist safety;To address functional/ADL transfers PT goals addressed during session: Mobility/safety with mobility OT goals addressed during session: ADL's and self-care       AM-PAC PT "6 Clicks" Mobility  Outcome Measure Help needed turning from your back to your side while in a flat bed without using bedrails?: A Lot Help needed moving from lying on your back to sitting on the side of a flat bed without using bedrails?: A Lot Help needed moving to and from a bed to a chair (including a wheelchair)?: Total Help needed standing up from a chair using your arms (e.g., wheelchair or bedside chair)?: Total Help needed to walk in hospital room?: Total Help needed climbing 3-5 steps with a railing? : Total 6 Click Score: 8    End of Session   Activity Tolerance: Patient tolerated treatment well;Patient limited by pain Patient left: in chair;with call bell/phone within reach;with chair alarm set Nurse Communication: Mobility status PT Visit Diagnosis: Unsteadiness on feet (R26.81);Other abnormalities of gait and mobility (R26.89);Difficulty in walking, not elsewhere classified (R26.2);Pain Pain - part of body:  (L foot amputation site.)  Time: 7408-1448 PT Time Calculation (min) (ACUTE ONLY): 25 min   Charges:   PT Evaluation $PT Eval Moderate Complexity: 1 Mod          02/14/2021  Ginger Carne., PT Acute Rehabilitation Services [phone]  (pager) [phone]  (office)  Tessie Fass Couper [place] 02/14/2021, 2:53 PM

## 2021-02-14 NOTE — Progress Notes (Addendum)
  Progress Note    02/14/2021 8:04 AM 1 Day Post-Op  Subjective:  overnight events noted. Patient is coherent this morning. No complaints. States left foot feels better   Vitals:   02/13/21 2340 02/14/21 0443  BP: (!) 148/81 (!) 149/76  Pulse: 91 96  Resp: 12 14  Temp: 98.3 F (36.8 C) 98.3 F (36.8 C)  SpO2: 100% 99%   Physical Exam: Cardiac:  regular Lungs:  non labored Incisions:  left leg incisions are clean, dry and intact. No swelling or hematoma. Left foot wound dressings intact, clean and dry Extremities:  well perfused and warm. Doppler At and PT signals in left foot Neurologic: alert and oriented  CBC    Component Value Date/Time   WBC 9.8 02/14/2021 0503   RBC 3.78 (L) 02/14/2021 0503   HGB 10.2 (L) 02/14/2021 0503   HGB 13.6 05/15/2015 1108   HCT 32.0 (L) 02/14/2021 0503   HCT 42.2 05/15/2015 1108   PLT 260 02/14/2021 0503   PLT 280 05/15/2015 1108   MCV 84.7 02/14/2021 0503   MCV 84 05/15/2015 1108   MCH 27.0 02/14/2021 0503   MCHC 31.9 02/14/2021 0503   RDW 14.7 02/14/2021 0503   RDW 15.4 05/15/2015 1108   LYMPHSABS 1.3 02/12/2021 1427   LYMPHSABS 1.6 05/15/2015 1108   MONOABS 0.7 02/12/2021 1427   EOSABS 0.1 02/12/2021 1427   EOSABS 0.1 05/15/2015 1108   BASOSABS 0.0 02/12/2021 1427   BASOSABS 0.0 05/15/2015 1108    BMET    Component Value Date/Time   NA 140 02/14/2021 0503   NA 143 05/15/2015 1108   K 3.9 02/14/2021 0503   CL 107 02/14/2021 0503   CO2 22 02/14/2021 0503   GLUCOSE 128 (H) 02/14/2021 0503   BUN 19 02/14/2021 0503   BUN 13 05/15/2015 1108   CREATININE 0.75 02/14/2021 0503   CALCIUM 8.6 (L) 02/14/2021 0503   GFRNONAA >60 02/14/2021 0503   GFRAA >60 12/20/2017 1529    INR    Component Value Date/Time   INR 1.0 02/06/2021 0943     Intake/Output Summary (Last 24 hours) at 02/14/2021 0804 Last data filed at 02/14/2021 0415 Gross per 24 hour  Intake 2400 ml  Output 315 ml  Net 2085 ml     Assessment/Plan:  70  y.o. female is s/p  #1: Left common femoral to below-knee popliteal artery bypass graft with ipsilateral translocated nonreverse saphenous vein #2 Left fifth toe amputation including metatarsal head 1 Day Post-Op   Left leg pain improved  Left lower extremity well perfused following bypass. Dopper AT and PT signals in left foot Left leg Incisions are clean, dry and intact without swelling or hematoma Dry gauze to left groin to wick moisture Left foot dressings will be changed tomorrow. Presently clean and dry Hemodynamically stable. VSS PT/OT/ Mobilize Darco shoe ordered  DVT prophylaxis:  sq heparin   Karoline Caldwell, PA-C [facility] [phone] 02/14/2021 8:04 AM   I agree with the above.  Have seen and evaluated the patient.  She has a risk anterior tibial Doppler signals on the left.  Dressings are intact.  Incisions are healing nicely.  Left foot she will need aspirin and a statin.  We will change her dressings tomorrow.  Annamarie Major

## 2021-02-15 ENCOUNTER — Other Ambulatory Visit: Payer: Self-pay

## 2021-02-15 LAB — CBC
HCT: 35.3 % — ABNORMAL LOW (ref 36.0–46.0)
Hemoglobin: 11.1 g/dL — ABNORMAL LOW (ref 12.0–15.0)
MCH: 27.2 pg (ref 26.0–34.0)
MCHC: 31.4 g/dL (ref 30.0–36.0)
MCV: 86.5 fL (ref 80.0–100.0)
Platelets: 214 10*3/uL (ref 150–400)
RBC: 4.08 MIL/uL (ref 3.87–5.11)
RDW: 15 % (ref 11.5–15.5)
WBC: 9.7 10*3/uL (ref 4.0–10.5)
nRBC: 0 % (ref 0.0–0.2)

## 2021-02-15 LAB — GLUCOSE, CAPILLARY
Glucose-Capillary: 110 mg/dL — ABNORMAL HIGH (ref 70–99)
Glucose-Capillary: 165 mg/dL — ABNORMAL HIGH (ref 70–99)
Glucose-Capillary: 150 mg/dL — ABNORMAL HIGH (ref 70–99)
Glucose-Capillary: 188 mg/dL — ABNORMAL HIGH (ref 70–99)

## 2021-02-15 MED ORDER — ENOXAPARIN SODIUM 40 MG/0.4ML IJ SOSY
40.0000 mg | PREFILLED_SYRINGE | INTRAMUSCULAR | Status: DC
  Administered 2021-02-15 – 2021-02-19 (×5): 40 mg via SUBCUTANEOUS
  Filled 2021-02-15 (×2): qty 0.4

## 2021-02-15 MED ORDER — LOSARTAN POTASSIUM 50 MG PO TABS
50.0000 mg | ORAL_TABLET | Freq: Every day | ORAL | Status: DC
  Administered 2021-02-15 – 2021-02-19 (×5): 50 mg via ORAL
  Filled 2021-02-15 (×5): qty 1

## 2021-02-15 NOTE — Progress Notes (Addendum)
[facility] [place]  Subjective  - Pain issues with dressing changes.   Objective (!) 157/75 86 98.3 F (36.8 C) (Oral) 17 98%  Intake/Output Summary (Last 24 hours) at 02/15/2021 0738 Last data filed at 02/14/2021 1400 Gross per 24 hour  Intake 960 ml  Output 1400 ml  Net -440 ml    Doppler AT/PT intact Incisions healing nicely Left groin soft dry 4 x 4 placed in groin crease  Left 5th toe wound packed with wet to dry dressing, skin edges without ischemic changes Lungs non labored breathing  Assessment/Planning: 70 y.o. female is s/p  #1: Left common femoral to below-knee popliteal artery bypass graft with ipsilateral translocated nonreverse saphenous vein #2 Left fifth toe amputation including metatarsal head 2 Day Post-Op   Doppler signals AT/PT intact.  5th toe wound wet to dry packing daily. Dry 4 x 4 to groin. Darco shoe for ambulation aspirin and a statin  Roxy Horseman 02/15/2021 7:38 AM --  Laboratory Lab Results: Recent Labs    02/14/21 0503 02/15/21 0131  WBC 9.8 9.7  HGB 10.2* 11.1*  HCT 32.0* 35.3*  PLT 260 214   BMET Recent Labs    02/13/21 0111 02/13/21 1100 02/14/21 0503  NA 138 141 140  K 3.8 4.1 3.9  CL 104  --  107  CO2 23  --  22  GLUCOSE 93  --  128*  BUN 26*  --  19  CREATININE 1.23*  --  0.75  CALCIUM 8.8*  --  8.6*    COAG Lab Results  Component Value Date   INR 1.0 02/06/2021   INR 1.05 05/23/2013   No results found for: PTT   I agree with the above.  I seen and evaluated the patient.  She is postop day #2 status post left femoral below-knee popliteal bypass graft and fifth toe amputation.  She did not get out of bed yesterday secondary to pain.  We will need to work with mobilization.  Dressings were changed today.  Her wound is healing nicely.  She has a palpable dorsalis pedis pulse.  Anticipate disposition to SNF.      Annamarie Major

## 2021-02-15 NOTE — Progress Notes (Signed)
HD#9 Subjective:  Overnight Events: None  This morning, Ms. Krempasky is resting in bed comfortably.  Endorses pain in her upper leg that radiates down.  Encourage patient to walk around as tolerable and sit up in recliner during meals.  Discussed that we will continue with physical therapy and SNF placement.  Patient notes that she would like to go to a SNF facility for further rehabilitation.  She has no other complaints this morning.  Objective:  Vital signs in last 24 hours: Vitals:   02/14/21 1742 02/14/21 2007 02/15/21 0020 02/15/21 0300  BP: 140/82 (!) 153/80 (!) 142/87 (!) 157/75  Pulse: 88 100 94 86  Resp: 18 19 19 17   Temp: 98.3 F (36.8 C) 98.1 F (36.7 C) 98.1 F (36.7 C) 98.3 F (36.8 C)  TempSrc: Oral Oral Oral Oral  SpO2: 99% 99% 98% 98%  Weight:    97.6 kg  Height:       Supplemental O2: Saturating well on room air  Physical Exam:  Constitutional: In no acute distress HENT: normocephalic atraumatic Eyes: conjunctiva non-erythematous.  Neck: supple Pulmonary/Chest: normal work of breathing on room air Neurological: alert & oriented x 3. Skin: warm and dry lower extremities. Left foot wrapped with bandage.  Surgical incision sites are clean dry and intact.  No ecchymosis or erythema appreciated.  No hematomas noted.  No drainage. Psych: Normal mood and thought process     Filed Weights   02/12/21 0506 02/13/21 0310 02/15/21 0300  Weight: 91.8 kg 92.7 kg 97.6 kg     Intake/Output Summary (Last 24 hours) at 02/15/2021 0615 Last data filed at 02/14/2021 1400 Gross per 24 hour  Intake 960 ml  Output 1400 ml  Net -440 ml    Net IO Since Admission: -666.88 mL [02/15/21 0615]  Pertinent Labs: CBC Latest Ref Rng & Units 02/15/2021 02/14/2021 02/13/2021  WBC 4.0 - 10.5 K/uL 9.7 9.8 -  Hemoglobin 12.0 - 15.0 g/dL 11.1(L) 10.2(L) 10.9(L)  Hematocrit 36.0 - 46.0 % 35.3(L) 32.0(L) 32.0(L)  Platelets 150 - 400 K/uL 214 260 -    CMP Latest Ref Rng & Units  02/14/2021 02/13/2021 02/13/2021  Glucose 70 - 99 mg/dL 128(H) - 93  BUN 8 - 23 mg/dL 19 - 26(H)  Creatinine 0.44 - 1.00 mg/dL 0.75 - 1.23(H)  Sodium 135 - 145 mmol/L 140 141 138  Potassium 3.5 - 5.1 mmol/L 3.9 4.1 3.8  Chloride 98 - 111 mmol/L 107 - 104  CO2 22 - 32 mmol/L 22 - 23  Calcium 8.9 - 10.3 mg/dL 8.6(L) - 8.8(L)  Total Protein 6.5 - 8.1 g/dL - - -  Total Bilirubin 0.3 - 1.2 mg/dL - - -  Alkaline Phos 38 - 126 U/L - - -  AST 15 - 41 U/L - - -  ALT 0 - 44 U/L - - -    Imaging: No results found.  Assessment/Plan:   Principal Problem:   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 (HCC) Active Problems:   Preoperative cardiovascular examination   Hypertension   Tobacco abuse   Diabetes mellitus with peripheral vascular disease (HCC)   Gangrene (HCC)   S/P femoral-popliteal bypass surgery   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 ([place])  [name] is a 70 y.o. with a pertinent PMH of type 2 diabetes, tobacco use disorder, hypertension, who presented with pain of left fifth toe and admitted for dry gangrenous fifth left toe   Dry gangrene, left  fifth toe s/p amputation of fifth  PAD s/p left femoral-popliteal artery bypass Patient postop day 3 of left fifth toe amputation and left femoral-popliteal artery bypass.  Patient continues to endorse pain in her left medial aspect of her leg.  Incision sites are clean dry and intact.  No evidence of hematoma.  Suspect postoperative pain.  Continue current pain management as below.  Encourage patient to Ambulate as tolerable and eat meals in the recliner.  PT/OT recommending SNF facility, will work with social work to arrange this. -Appreciate vascular and orthopedic surgeries assistance -Pain control - Tylenol 650 mg q6h PRN for mild to moderate pain, Percocet 5-325 mg q4h PRN for severe pain -Continue high intensity statin, Atorvastatin 40 mg daily -Continue daily aspirin 81 mg -Advance diet to carb modified as  tolerable -PT/OT recommend SNF   Hypertension Blood pressure of 138/69.  Increase losartan to 50 mg daily -Amlodipine 10 mg daily -Losartan 50 mg daily  Type 2 diabetes Hx ot T2DM, untreated prior to admission. A1c during admission of 6.1%. CBGs are within goal during hospitalization thus far. -Continue SSI   Tobacco use disorder Patient with extensive history of tobacco use.  Patient interested in quitting, will continue to discuss starting Chantix at discharge.  Obstructive sleep apnea Patient with history of mild OSA. Will order CPAP  Diet: Carb-Modified IVF: None,None VTE: Enoxaparin Code: Full PT/OT recs: SNF, 3 and 1 TOC recs: None at this time  Dispo: Anticipated discharge to SNF pending placement  [facility] Resident PGY-1 Pager [phone] Please contact the on call pager after 5 pm and on weekends at [phone].

## 2021-02-15 NOTE — Progress Notes (Signed)
Attempted to wake Pt for lunch.  Pt desires to keep sleeping at this time.  Will cont plan of care

## 2021-02-15 NOTE — Progress Notes (Signed)
Physical Therapy Treatment Patient Details Name: Tiffany [name] MRN: [id number] DOB: [birth year] Today's Date: 02/15/2021    History of Present Illness 70 y.o. female who presented with left fifth toe gangrene and osteomyelitis. Pt initially underwent abdominal aortogram of L LE on 6/17.   6/22  S/p Left common femoaral to BK popliteal artery BPG and Left fifth toe amputation including metatarsal head. PMH: DM II, GERD, HTN, L TKA.    PT Comments    The pt presents with continued limitations in mobility due to pain in LLE at this time. The pt was agreeable to attempt OOB mobility, but required max cues, max increased time, and eventually physical assist to complete any mobility (moving LE in bed, scooting along EOB, or repositioning in bed). The pt was agreeable to attempt sit-stand transfers x2, but was unable to maintain for > 5 seconds at a time and was unable to generate any pivoting steps at this time. Max cues for hip extension, hand position, and posture with little carryover or adherence by pt. The pt will continue to benefit from skilled PT acutely to progress OOB mobility and LE strength and ROM.     Follow Up Recommendations  SNF;Supervision/Assistance - 24 hour     Equipment Recommendations  Other (comment) (defer to post acute)    Recommendations for Other Services       Precautions / Restrictions Precautions Precautions: Fall Required Braces or Orthoses: Other Brace Other Brace: L darco shoe Restrictions Weight Bearing Restrictions: Yes LLE Weight Bearing: Partial weight bearing LLE Partial Weight Bearing Percentage or Pounds: heel only    Mobility  Bed Mobility Overal bed mobility: Needs Assistance Bed Mobility: Supine to Sit;Sit to Supine     Supine to sit: Max assist;HOB elevated Sit to supine: Max assist;+2 for physical assistance;HOB elevated   General bed mobility comments: maxA to complete movement to EOb, maxA to each LE to complete movement, very slow  due to pt reports of pain. modA to raise trunk from [facility]. maxA of 2 to return to supine, pt with no initiation of movement despite increased time and cues    Transfers Overall transfer level: Needs assistance Equipment used: Rolling walker (2 wheeled) Transfers: Sit to/from Stand Sit to Stand: Mod assist;+2 physical assistance;From elevated surface         General transfer comment: modA of 2 to power up from elevated EOB, max cues for hip extension, posture, pt unable to initiate any wt shift or movement of LE to pivot to recliner. x2 sit-stand this session  Ambulation/Gait             General Gait Details: pt unable      Balance Overall balance assessment: Needs assistance Sitting-balance support: Feet supported;No upper extremity supported Sitting balance-Leahy Scale: Fair     Standing balance support: Bilateral upper extremity supported;During functional activity Standing balance-Leahy Scale: Poor Standing balance comment: reliant on BUE + external support                            Cognition Arousal/Alertness: Awake/alert Behavior During Therapy: Flat affect;Agitated Overall Cognitive Status: No family/caregiver present to determine baseline cognitive functioning Area of Impairment: Following commands;Safety/judgement;Awareness;Problem solving;Attention                   Current Attention Level: Focused   Following Commands: Follows one step commands with increased time Safety/Judgement: Decreased awareness of safety;Decreased awareness of deficits Awareness: Intellectual Problem Solving:  Slow processing;Decreased initiation;Difficulty sequencing;Requires verbal cues;Requires tactile cues General Comments: pt with flat affect, initially attempting to decline therapy, but agreeable with offer of pain medicine. Pt with slowed movement in response to any commands, very flat affect with minimal verbal participation, repeated cues to get pt to initiate  movements. Pt with noteably different affect with family present vs therapists (laughing and chatting with family, requiring multiple prompts to answer therapist questions), also with x2 verbal threats of violence      Exercises General Exercises - Lower Extremity Ankle Circles/Pumps: AROM;Both;10 reps;Supine Heel Slides: PROM;Both;10 reps;Supine Hip ABduction/ADduction: AAROM;Both;10 reps;Supine    General Comments General comments (skin integrity, edema, etc.): VSS through session      Pertinent Vitals/Pain Pain Assessment: Faces Faces Pain Scale: Hurts whole lot Pain Location: L leg and foot Pain Descriptors / Indicators: Discomfort;Grimacing;Guarding Pain Intervention(s): Limited activity within patient's tolerance;Monitored during session;RN gave pain meds during session;Repositioned     PT Goals (current goals can now be found in the care plan section) Acute Rehab PT Goals Patient Stated Goal: go to sleep PT Goal Formulation: With patient Time For Goal Achievement: 02/28/21 Potential to Achieve Goals: Good Progress towards PT goals: Not progressing toward goals - comment (limited by pain)    Frequency    Min 3X/week      PT Plan Current plan remains appropriate       AM-PAC PT "6 Clicks" Mobility   Outcome Measure  Help needed turning from your back to your side while in a flat bed without using bedrails?: A Lot Help needed moving from lying on your back to sitting on the side of a flat bed without using bedrails?: A Lot Help needed moving to and from a bed to a chair (including a wheelchair)?: Total Help needed standing up from a chair using your arms (e.g., wheelchair or bedside chair)?: A Lot Help needed to walk in hospital room?: Total Help needed climbing 3-5 steps with a railing? : Total 6 Click Score: 9    End of Session Equipment Utilized During Treatment: Gait belt Activity Tolerance: Patient limited by pain Patient left: in bed;with call  bell/phone within reach;with bed alarm set Nurse Communication: Mobility status PT Visit Diagnosis: Unsteadiness on feet (R26.81);Other abnormalities of gait and mobility (R26.89);Difficulty in walking, not elsewhere classified (R26.2);Pain     Time: 5009-3818 PT Time Calculation (min) (ACUTE ONLY): 34 min  Charges:  $Therapeutic Exercise: 8-22 mins $Therapeutic Activity: 8-22 mins                     Karma Ganja, PT, DPT   Acute Rehabilitation Department Pager #: [phone]   Otho Bellows 02/15/2021, 6:12 PM

## 2021-02-15 NOTE — Progress Notes (Signed)
Patient refuses cpap 

## 2021-02-16 LAB — GLUCOSE, CAPILLARY
Glucose-Capillary: 110 mg/dL — ABNORMAL HIGH (ref 70–99)
Glucose-Capillary: 133 mg/dL — ABNORMAL HIGH (ref 70–99)
Glucose-Capillary: 123 mg/dL — ABNORMAL HIGH (ref 70–99)
Glucose-Capillary: 144 mg/dL — ABNORMAL HIGH (ref 70–99)

## 2021-02-16 NOTE — TOC Initial Note (Addendum)
Transition of Care [facility]) - Initial/Assessment Note    Patient Details  Name: Tiffany [name] MRN: [id number] Date of Birth: 04/20/1951  Transition of Care [facility]) CM/SW Contact:    Loreta Ave, [place] Phone Number: 02/16/2021, 3:24 PM  Clinical Narrative:                 CSW received consult from MD in reference to possible SNF placement. CSW reached out to pt's daughter, who wasn't available, a message was taken for her. CSW went to pt's room, pt soundly sleeping. CSW will follow up with daughter as pt's orientation is fluctuating. CSW adv MD that pt's insurance requires auth to be initiated by SNF choice, so pt may not dc before early next week.         Patient Goals and CMS Choice        Expected Discharge Plan and Services                                                Prior Living Arrangements/Services                       Activities of Daily Living Home Assistive Devices/Equipment: None ADL Screening (condition at time of admission) Patient's cognitive ability adequate to safely complete daily activities?: Yes Is the patient deaf or have difficulty hearing?: No Does the patient have difficulty seeing, even when wearing glasses/contacts?: No Does the patient have difficulty concentrating, remembering, or making decisions?: No Patient able to express need for assistance with ADLs?: Yes Does the patient have difficulty dressing or bathing?: Yes Independently performs ADLs?: No Communication: Independent Dressing (OT): Needs assistance Is this a change from baseline?: Change from baseline, expected to last >3 days Grooming: Needs assistance Is this a change from baseline?: Change from baseline, expected to last >3 days Feeding: Independent Bathing: Needs assistance Is this a change from baseline?: Change from baseline, expected to last >3 days Toileting: Needs assistance Is this a change from baseline?: Change from baseline, expected to last  >3days In/Out Bed: Needs assistance Is this a change from baseline?: Change from baseline, expected to last >3 days Walks in Home: Needs assistance Is this a change from baseline?: Change from baseline, expected to last >3 days Does the patient have difficulty walking or climbing stairs?: Yes Weakness of Legs: Both Weakness of Arms/Hands: None  Permission Sought/Granted                  Emotional Assessment              Admission diagnosis:  Hyperglycemia [R73.9] Cellulitis of left lower extremity [L03.116] Necrotic toes ([place]) [I96] Osteomyelitis of left foot, unspecified type ([place]) [M86.9] Hypertension, unspecified type [I10] 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 ([place]) [M86.9] Patient Active Problem List   Diagnosis Date Noted   S/P femoral-popliteal bypass surgery 02/13/2021   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 ([place]) 02/13/2021   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 ([place]) 02/06/2021   Gangrene ([place])    Diabetes mellitus with peripheral vascular disease ([place]) 09/13/2015   Generalized anxiety disorder 06/15/2014   Chronic pain 12/28/2013   GERD (gastroesophageal reflux disease) 09/01/2013   Tobacco abuse 09/01/2013   Hypertension    Expected blood loss anemia 06/01/2013   Morbid obesity ([place]) 06/01/2013   S/P  left TKA 05/31/2013   Preoperative cardiovascular examination 02/18/2013   PCP:  Mardi Mainland, FNP Pharmacy:   [id number] - [place], [place] [address] Phone: [phone] Fax: [phone]     Social Determinants of Health (SDOH) Interventions    Readmission Risk Interventions No flowsheet data found.

## 2021-02-16 NOTE — Progress Notes (Signed)
HD#10 Subjective:  Overnight Events: None  Tiffany [name] is resting in bed comfortably. She notes the pain in her left leg is improving. She still endorses feeling weak, but notes overall improvement. She is eating and drinking without difficulty. Passing her bowels and urinating without difficulties. She has no other acute concerns at this time.   Objective:  Vital signs in last 24 hours: Vitals:   02/15/21 1930 02/15/21 2311 02/16/21 0305 02/16/21 0500  BP: 125/65 125/76 (!) 147/86   Pulse: 69 66 68   Resp: 16 16 16    Temp: 98 F (36.7 C) (!) 97.5 F (36.4 C) 98 F (36.7 C)   TempSrc: Oral Oral Oral   SpO2: 97% 97% 95%   Weight:    97 kg  Height:       Supplemental O2: Saturating well on room air  Physical Exam:  Constitutional: In no acute distress HENT: normocephalic atraumatic Eyes: conjunctiva non-erythematous.  Neck: supple Pulmonary/Chest: normal work of breathing on room air Neurological: alert & oriented x 3. Skin: warm and dry lower extremities. Left foot wrapped with bandage.  Surgical incision sites are clean dry and intact.  No ecchymosis or erythema appreciated.  No hematomas or evidence of drainage.  Psych: Normal mood and thought process  Filed Weights   02/13/21 0310 02/15/21 0300 02/16/21 0500  Weight: 92.7 kg 97.6 kg 97 kg     Intake/Output Summary (Last 24 hours) at 02/16/2021 0644 Last data filed at 02/16/2021 0615 Gross per 24 hour  Intake 1200 ml  Output 130 ml  Net 1070 ml    Net IO Since Admission: 403.12 mL [02/16/21 0644]  Pertinent Labs: CBC Latest Ref Rng & Units 02/15/2021 02/14/2021 02/13/2021  WBC 4.0 - 10.5 K/uL 9.7 9.8 -  Hemoglobin 12.0 - 15.0 g/dL 11.1(L) 10.2(L) 10.9(L)  Hematocrit 36.0 - 46.0 % 35.3(L) 32.0(L) 32.0(L)  Platelets 150 - 400 K/uL 214 260 -    CMP Latest Ref Rng & Units 02/14/2021 02/13/2021 02/13/2021  Glucose 70 - 99 mg/dL 128(H) - 93  BUN 8 - 23 mg/dL 19 - 26(H)  Creatinine 0.44 - 1.00 mg/dL 0.75 - 1.23(H)   Sodium 135 - 145 mmol/L 140 141 138  Potassium 3.5 - 5.1 mmol/L 3.9 4.1 3.8  Chloride 98 - 111 mmol/L 107 - 104  CO2 22 - 32 mmol/L 22 - 23  Calcium 8.9 - 10.3 mg/dL 8.6(L) - 8.8(L)  Total Protein 6.5 - 8.1 g/dL - - -  Total Bilirubin 0.3 - 1.2 mg/dL - - -  Alkaline Phos 38 - 126 U/L - - -  AST 15 - 41 U/L - - -  ALT 0 - 44 U/L - - -    Imaging: No results found.  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 with a pertinent PMH of type 2 diabetes, tobacco use disorder, hypertension, who presented with pain of left fifth toe and admitted for dry gangrenous fifth left toe   Dry gangrene, left fifth toe s/p amputation of fifth  PAD s/p left femoral-popliteal artery bypass Patient postop day 4 of left fifth toe amputation and left femoral-popliteal artery  bypass.  Patient continues to endorse pain in her left medial aspect of her leg.  Incision sites are clean dry and intact.  No evidence of hematoma.  Postoperative pain has improved. She is medically stable and pending SNF discharge at this time.  -Appreciate vascular and orthopedic surgeries assistance -Pain control - Tylenol 650 mg q6h PRN for mild to moderate pain, Percocet 5-325 mg q4h PRN for severe pain -Continue high intensity statin, Atorvastatin 40 mg daily -Continue daily aspirin 81 mg -Advance diet to carb modified as tolerable -PT/OT recommend SNF   Hypertension Blood pressure of 145/86.  Continue current management of -Amlodipine 10 mg daily -Losartan 50 mg daily  Type 2 diabetes Hx ot T2DM, untreated prior to admission. A1c during admission of 6.1%. CBGs are within goal  during hospitalization thus far. -Continue SSI   Tobacco use disorder Patient with extensive history of tobacco use.  Patient interested in quitting, will continue to discuss starting Chantix at discharge.  Obstructive sleep apnea Patient with history of mild OSA.  -Nightly CPAP ordered  Diet: Carb-Modified IVF: None,None VTE: Enoxaparin Code: Full PT/OT recs: SNF, 3 and 1 TOC recs: None at this time  Dispo: Anticipated discharge to SNF pending placement  [facility] Resident PGY-1 Pager [phone] Please contact the on call pager after 5 pm and on weekends at [phone].

## 2021-02-16 NOTE — Progress Notes (Addendum)
[facility] [place]  Subjective  - Slowly making progress, tried to stand yesterday.   Objective (!) 145/78 77 99.3 F (37.4 C) (Oral) 16 100%  Intake/Output Summary (Last 24 hours) at 02/16/2021 0813 Last data filed at 02/16/2021 0759 Gross per 24 hour  Intake 1200 ml  Output 330 ml  Net 870 ml    Left foot with clean dry dressing Sensation intact, leg incisions healing well Left groin soft without hematoma, dry 4 x 4 in groin crease Lungs non labored breathing  Assessment/Planning: 70 y.o. female is s/p  #1: Left common femoral to below-knee popliteal artery bypass graft with ipsilateral translocated nonreverse saphenous vein #2 Left fifth toe amputation including metatarsal head 3 Day Post-Op   We will plan on changing the dressing tomorrow am to check amp. Site. Mobility encouraged Darco shoe for weight bearing ASA and Statin daily Stable inflow with good doppler signal AT/PT  Roxy Horseman 02/16/2021 8:13 AM --  Laboratory Lab Results: Recent Labs    02/14/21 0503 02/15/21 0131  WBC 9.8 9.7  HGB 10.2* 11.1*  HCT 32.0* 35.3*  PLT 260 214   BMET Recent Labs    02/13/21 1100 02/14/21 0503  NA 141 140  K 4.1 3.9  CL  --  107  CO2  --  22  GLUCOSE  --  128*  BUN  --  19  CREATININE  --  0.75  CALCIUM  --  8.6*    COAG Lab Results  Component Value Date   INR 1.0 02/06/2021   INR 1.05 05/23/2013   No results found for: PTT    I have interviewed and examined patient with PA and agree with assessment and plan above.   Arianni Gallego C. Donzetta Matters, MD [facility] [place] Office: [phone] Pager: [phone]

## 2021-02-16 NOTE — NC FL2 (Signed)
[place] MEDICAID FL2 LEVEL OF CARE SCREENING TOOL     IDENTIFICATION  Patient Name: Tiffany [name] Birthdate: 1950/12/14 Sex: female Admission Date (Current Location): 02/05/2021  [facility] and [place] Number:  Herbalist and Address:  [facility] 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 ([place]) 02/13/2021   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 ([place]) 02/06/2021   Gangrene ([place])    Diabetes mellitus with peripheral vascular disease ([place]) 09/13/2015   Generalized anxiety disorder 06/15/2014   Chronic pain 12/28/2013   GERD (gastroesophageal reflux disease) 09/01/2013   Tobacco abuse 09/01/2013   Hypertension    Expected blood loss anemia 06/01/2013   Morbid obesity ([place]) 06/01/2013   S/P left TKA 05/31/2013   Preoperative cardiovascular examination 02/18/2013    Orientation RESPIRATION BLADDER Height & Weight     Time, Situation, Place  Normal Continent, Indwelling catheter Weight: 213 lb 13.5 oz (97 kg) Height:  5\' 2"  (157.5 cm)  BEHAVIORAL SYMPTOMS/MOOD NEUROLOGICAL BOWEL NUTRITION STATUS      Continent Diet (see dc summary)  AMBULATORY STATUS COMMUNICATION OF NEEDS Skin   Extensive Assist Verbally Surgical wounds (02/13/21-Left groin, left thigh, left toe, 02/06/21 non pressure wound left toe)                       Personal Care Assistance Level of Assistance  Bathing, Feeding, Dressing Bathing Assistance: Maximum  assistance Feeding assistance: Independent Dressing Assistance: Maximum assistance     Functional Limitations Info  Sight, Hearing, Speech Sight Info: Impaired Hearing Info: Impaired Speech Info: Adequate    SPECIAL CARE FACTORS FREQUENCY  PT (By licensed PT), OT (By licensed OT)     PT Frequency: 5x week OT Frequency: 5x week            Contractures Contractures Info: Not present    Additional Factors Info  Code Status, Allergies, Insulin Sliding Scale Code Status Info: Full Allergies Info: Cymbalta   Insulin Sliding Scale Info: insulin aspart (novoLOG) injection 0-15 Units 3X daily with meals       Current Medications (02/16/2021):  This is the current hospital active medication list Current Facility-Administered Medications  Medication Dose Route Frequency Provider Last Rate Last Admin   0.9 %  sodium chloride infusion  500 mL Intravenous Once PRN Dagoberto Ligas, PA-C       acetaminophen (TYLENOL) tablet 650 mg  650 mg Oral Q6H PRN Dagoberto Ligas, PA-C       Or   acetaminophen (TYLENOL) suppository 650 mg  650 mg Rectal Q6H PRN Dagoberto Ligas, PA-C       amLODipine (NORVASC) tablet 10 mg  10 mg Oral Daily Dagoberto Ligas, PA-C   10 mg at 02/16/21 2409   aspirin EC tablet 81 mg  81 mg Oral Daily Dagoberto Ligas, PA-C   81 mg at 02/16/21 0924   atorvastatin (LIPITOR) tablet 40 mg  40 mg Oral Daily Evel[place],  Rodman Key, PA-C   40 mg at 02/16/21 1470   Chlorhexidine Gluconate Cloth 2 % PADS 6 each  6 each Topical Daily Axel Filler, MD   6 each at 02/14/21 1000   enoxaparin (LOVENOX) injection 40 mg  40 mg Subcutaneous Q24H Jose Persia, MD   40 mg at 02/15/21 1433   guaiFENesin-dextromethorphan (ROBITUSSIN DM) 100-10 MG/5ML syrup 15 mL  15 mL Oral Q4H PRN Dagoberto Ligas, PA-C       insulin aspart (novoLOG) injection 0-15 Units  0-15 Units Subcutaneous TID WC Dagoberto Ligas, PA-C   2 Units at 02/15/21 1634   losartan (COZAAR) tablet 50 mg  50 mg Oral  Daily Jose Persia, MD   50 mg at 02/16/21 0924   ondansetron (ZOFRAN) tablet 4 mg  4 mg Oral Q6H PRN Dagoberto Ligas, PA-C       Or   ondansetron [facility]) injection 4 mg  4 mg Intravenous Q6H PRN Dagoberto Ligas, PA-C   4 mg at 02/07/21 1422   oxyCODONE-acetaminophen (PERCOCET/ROXICET) 5-325 MG per tablet 1-2 tablet  1-2 tablet Oral Q4H PRN Dagoberto Ligas, PA-C   2 tablet at 02/15/21 1631   pantoprazole (PROTONIX) EC tablet 40 mg  40 mg Oral Daily Dagoberto Ligas, PA-C   40 mg at 02/16/21 9295   phenol (CHLORASEPTIC) mouth spray 1 spray  1 spray Mouth/Throat PRN Dagoberto Ligas, PA-C         Discharge Medications: Please see discharge summary for a list of discharge medications.  Relevant Imaging Results:  Relevant Lab Results:   Additional Information SSN [id number]  Loreta Ave, LCSWA

## 2021-02-16 NOTE — Progress Notes (Signed)
Pt stated she doesn't wear CPAP at home. Pt doesn't want to wear CPAP.

## 2021-02-17 LAB — GLUCOSE, CAPILLARY
Glucose-Capillary: 166 mg/dL — ABNORMAL HIGH (ref 70–99)
Glucose-Capillary: 122 mg/dL — ABNORMAL HIGH (ref 70–99)
Glucose-Capillary: 109 mg/dL — ABNORMAL HIGH (ref 70–99)
Glucose-Capillary: 146 mg/dL — ABNORMAL HIGH (ref 70–99)

## 2021-02-17 NOTE — Progress Notes (Signed)
HD#11 Subjective:  Overnight Events: None  Tiffany [name] endorses continued left leg pain. Otherwise, no complaints. Discussed SNF after discharge and she is in agreement.   Objective:  Vital signs in last 24 hours: Vitals:   02/16/21 1639 02/16/21 1940 02/16/21 2323 02/17/21 0321  BP: 136/75 139/85 139/75 128/70  Pulse: 72  65 72  Resp: 18 16 17 20   Temp: 98.2 F (36.8 C) (!) 97.5 F (36.4 C) 98.4 F (36.9 C) 98.2 F (36.8 C)  TempSrc: Oral Oral Oral Oral  SpO2: 98% 100% 94% 96%  Weight:      Height:       Supplemental O2: Saturating well on room air  Physical Exam:  Constitutional: In no acute distress HENT: normocephalic atraumatic Eyes: conjunctiva non-erythematous.  Neck: supple Pulmonary/Chest: normal work of breathing on room air Neurological: alert & oriented x 3. Skin: warm and dry lower extremities. Left foot wrapped with bandage.  Left leg with increased edema compared to the right; unable to check for pitting due to pain. Surgical incision sites are clean dry and intact.  No ecchymosis or erythema appreciated.  No hematomas or evidence of drainage.  Psych: Normal mood and thought process. Delirium improving  Filed Weights   02/13/21 0310 02/15/21 0300 02/16/21 0500  Weight: 92.7 kg 97.6 kg 97 kg    Intake/Output Summary (Last 24 hours) at 02/17/2021 0754 Last data filed at 02/17/2021 0339 Gross per 24 hour  Intake 480 ml  Output 800 ml  Net -320 ml    Net IO Since Admission: 83.12 mL [02/17/21 0754]  Pertinent Labs: CBC Latest Ref Rng & Units 02/15/2021 02/14/2021 02/13/2021  WBC 4.0 - 10.5 K/uL 9.7 9.8 -  Hemoglobin 12.0 - 15.0 g/dL 11.1(L) 10.2(L) 10.9(L)  Hematocrit 36.0 - 46.0 % 35.3(L) 32.0(L) 32.0(L)  Platelets 150 - 400 K/uL 214 260 -    CMP Latest Ref Rng & Units 02/14/2021 02/13/2021 02/13/2021  Glucose 70 - 99 mg/dL 128(H) - 93  BUN 8 - 23 mg/dL 19 - 26(H)  Creatinine 0.44 - 1.00 mg/dL 0.75 - 1.23(H)  Sodium 135 - 145 mmol/L 140 141 138   Potassium 3.5 - 5.1 mmol/L 3.9 4.1 3.8  Chloride 98 - 111 mmol/L 107 - 104  CO2 22 - 32 mmol/L 22 - 23  Calcium 8.9 - 10.3 mg/dL 8.6(L) - 8.8(L)  Total Protein 6.5 - 8.1 g/dL - - -  Total Bilirubin 0.3 - 1.2 mg/dL - - -  Alkaline Phos 38 - 126 U/L - - -  AST 15 - 41 U/L - - -  ALT 0 - 44 U/L - - -   Imaging: No results found.  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 with a pertinent PMH of type 2 diabetes, tobacco use disorder, hypertension, who presented with pain of left fifth toe and admitted for dry gangrenous fifth left toe   Dry gangrene, left fifth toe s/p amputation of fifth  PAD s/p left femoral-popliteal artery bypass Patient postop day 4 of left fifth toe amputation and left femoral-popliteal artery bypass. Medically stable and pending SNF discharge at this time.   -  Appreciate vascular and orthopedic surgeries assistance -Pain control - Tylenol 650 mg q6h PRN for mild to moderate pain, Percocet 5-325 mg q4h PRN for severe pain -Continue high intensity statin, Atorvastatin 40 mg daily -Continue daily aspirin 81 mg -Advance diet to carb modified as tolerable -PT/OT recommend SNF. TOC working on placement.    Hypertension Blood pressure has improved greatly. No medication changes at this time.   -Amlodipine 10 mg daily -Losartan 50 mg daily  Type 2 diabetes Hx ot T2DM, untreated prior to admission. A1c during admission of 6.1%. CBGs are within goal during hospitalization thus far.  -Continue SSI   Tobacco use disorder Patient with extensive history of tobacco use.  Patient interested in quitting, will continue to  discuss starting Chantix at discharge.  Obstructive sleep apnea Patient with history of mild OSA.   -Nightly CPAP ordered  Diet: Carb-Modified IVF: None,None VTE: Enoxaparin Code: Full PT/OT recs: SNF, 3 and 1 TOC recs: None at this time  Dispo: Anticipated discharge to SNF pending placement  Dr. Jose Persia Internal Medicine PGY-2  Pager: [phone] After 5pm on weekdays and 1pm on weekends: On Call pager [phone]  02/17/2021, 7:55 AM

## 2021-02-17 NOTE — TOC Progression Note (Signed)
Transition of Care [facility]) - Progression Note    Patient Details  Name: Tiffany [name] MRN: [id number] Date of Birth: Nov 10, 1950  Transition of Care [facility]) CM/SW [place], [place] Phone Number: [phone] 02/17/2021, 4:17 PM  Clinical Narrative:     CSW reached out to pt's daughter Tiffany [name] to discuss discharge planning however had to leave a voice mail.  TOC team will continue to assist with discharge planning needs.   Expected Discharge Plan: [place] Barriers to Discharge: Continued Medical Work up, Ship broker  Expected Discharge Plan and Services Expected Discharge Plan: [place] In-house Referral: Clinical Social Work     Living arrangements for the past 2 months: Single Family Home                                       Social Determinants of Health (SDOH) Interventions    Readmission Risk Interventions No flowsheet data found.

## 2021-02-17 NOTE — Progress Notes (Addendum)
[facility] [place]  Subjective  - Pain at amputation site with dressing changes.   Objective 128/70 72 98.2 F (36.8 C) (Oral) 20 96%  Intake/Output Summary (Last 24 hours) at 02/17/2021 0749 Last data filed at 02/17/2021 0339 Gross per 24 hour  Intake 480 ml  Output 800 ml  Net -320 ml    Left 5th toe amputation  Palpable DP pulse Leg incisions healing well Left groin incision healing well, dry 4 x 4 replaced. Lungs non labored breathing  Assessment/Planning: 70 y.o. female is s/p  #1: Left common femoral to below-knee popliteal artery bypass graft with ipsilateral translocated nonreverse saphenous vein #2 Left fifth toe amputation including metatarsal head 3 Day Post-Op   Cont. BID wet to dry dressing changes Mobility ASA Statin Good inflow with patent bypass and healing incisions Pending discharge to SNF  Roxy Horseman 02/17/2021 7:49 AM --  Laboratory Lab Results: Recent Labs    02/15/21 0131  WBC 9.7  HGB 11.1*  HCT 35.3*  PLT 214   BMET No results for input(s): NA, K, CL, CO2, GLUCOSE, BUN, CREATININE, CALCIUM in the last 72 hours.  COAG Lab Results  Component Value Date   INR 1.0 02/06/2021   INR 1.05 05/23/2013   No results found for: PTT   I have independently examined patient and agree with PA assessment and plan above.  Wound appears to be healing well and her left foot is well-perfused.  Incisions are intact.  Dispo will be SNF.  Oprah Camarena C. Donzetta Matters, MD [facility] [place] Office: [phone] Pager: [phone]

## 2021-02-18 LAB — GLUCOSE, CAPILLARY
Glucose-Capillary: 109 mg/dL — ABNORMAL HIGH (ref 70–99)
Glucose-Capillary: 147 mg/dL — ABNORMAL HIGH (ref 70–99)
Glucose-Capillary: 155 mg/dL — ABNORMAL HIGH (ref 70–99)
Glucose-Capillary: 132 mg/dL — ABNORMAL HIGH (ref 70–99)

## 2021-02-18 LAB — SARS CORONAVIRUS 2 (TAT 6-24 HRS): SARS Coronavirus 2: NEGATIVE

## 2021-02-18 NOTE — TOC Progression Note (Signed)
Transition of Care [facility]) - Progression Note    Patient Details  Name: Tiffany [name] MRN: [id number] Date of Birth: Jan 02, 1951  Transition of Care [facility]) CM/SW [place], [place] Phone Number: 02/18/2021, 3:58 PM  Clinical Narrative:     [facility] informed CSW, they have received authorization- patient can admit tomorrow morning.  MD updated & covid test requested.  CSW will continue to follow and assist with discharge planning.  Thurmond Butts, MSW, LCSW Clinical Social Worker    Expected Discharge Plan: Skilled Nursing Facility Barriers to Discharge: Continued Medical Work up, Ship broker  Expected Discharge Plan and Services Expected Discharge Plan: [place] In-house Referral: Clinical Social Work     Living arrangements for the past 2 months: Single Family Home                                       Social Determinants of Health (SDOH) Interventions    Readmission Risk Interventions No flowsheet data found.

## 2021-02-18 NOTE — Progress Notes (Signed)
Mobility Specialist: Progress Note   02/18/21 1516  Mobility  Activity Refused mobility   Went to assist PTA Carly in STS with pt but pt refused. Pt refused mobility stating "she's tired right now and doesn't feel like getting up." Despite continued encouragement pt still refused.   [facility] Mobility Specialist Mobility Specialist Phone: [phone]

## 2021-02-18 NOTE — TOC Progression Note (Signed)
Transition of Care [facility]) - Progression Note    Patient Details  Name: Tiffany [name] MRN: [id number] Date of Birth: 15-Mar-1951  Transition of Care [facility]) CM/SW [place], [place] Phone Number: 02/18/2021, 11:35 AM  Clinical Narrative:     11:30am- [facility] called back -having problems with insurance authorization- checking with their business office and will call CSW back   11:20am-CSW met with patient at bedside- along with her daughter. Patient was agreeable short tern rehab at [place] contacted [facility]- they confirmed availability and will start insurance authorization.   Expected Discharge Plan: [place] Barriers to Discharge: Continued Medical Work up, Ship broker  Expected Discharge Plan and Services Expected Discharge Plan: [place] In-house Referral: Clinical Social Work     Living arrangements for the past 2 months: Single Family Home                                       Social Determinants of Health (SDOH) Interventions    Readmission Risk Interventions No flowsheet data found.

## 2021-02-18 NOTE — Progress Notes (Signed)
PtPhysical Therapy Treatment Patient Details Name: Tiffany [name] MRN: [id number] DOB: 1951/04/19 Today's Date: 02/18/2021    History of Present Illness 70 y.o. female who presented with left fifth toe gangrene and osteomyelitis. Pt initially underwent abdominal aortogram of L LE on 6/17. 6/22 s/p Left common femoral to BK popliteal artery BPG and Left fifth toe amputation including metatarsal head. PMH: DM II, GERD, HTN, L TKA.    PT Comments    Pt received in supine, agreeable to limited bed-level session but with poor participation in attempts at transfer training. Pt performed supine BLE A/AAROM therapeutic exercises as detailed below with fair tolerance and given HEP handout, pt given max encouragement to attempt seated/standing tasks but adamantly refusing, c/o fatigue. Reviewed pressure relief strategies, pt will likely need reinforcement. No family present during session. Pt continues to benefit from PT services to progress toward functional mobility goals.    Follow Up Recommendations  SNF;Supervision/Assistance - 24 hour     Equipment Recommendations  Other (comment) (defer to post acute)    Recommendations for Other Services       Precautions / Restrictions Precautions Precautions: Fall Required Braces or Orthoses: Other Brace Other Brace: L darco shoe Restrictions Weight Bearing Restrictions: Yes LLE Weight Bearing: Partial weight bearing Other Position/Activity Restrictions: heel WB in post-op darco shoe    Mobility  Bed Mobility Overal bed mobility: Needs Assistance Bed Mobility: Rolling Rolling: Min assist         General bed mobility comments: pt only completing a partial roll for repositioning in bed, refusing EOB despite max encouragement    Transfers       General transfer comment: pt adamantly refusing to attempt, c/o fatigue; had sat EOB per nsg staff earlier in day.              Balance Overall balance assessment: Needs assistance      Sitting balance - Comments: pt refused this date                                    Cognition Arousal/Alertness: Awake/alert Behavior During Therapy: Flat affect Overall Cognitive Status: No family/caregiver present to determine baseline cognitive functioning Area of Impairment: Following commands;Safety/judgement;Awareness;Problem solving;Attention;Memory                 Orientation Level: Disoriented to;Time Current Attention Level: Focused Memory: Decreased short-term memory Following Commands: Follows one step commands with increased time Safety/Judgement: Decreased awareness of safety;Decreased awareness of deficits Awareness: Intellectual Problem Solving: Slow processing;Decreased initiation;Difficulty sequencing;Requires verbal cues;Requires tactile cues General Comments: Pt with flat affect, initially attempting to decline therapy, but agreeable to bed-level session with encouragement. Pt with slowed movement in response to any commands, repeated cues to get pt to initiate movements. Pt family not present in room at time of session.      Exercises General Exercises - Lower Extremity Ankle Circles/Pumps: AROM;Both;10 reps;Supine Quad Sets: AROM;Both;10 reps;Supine Gluteal Sets: AROM;Both;5 reps;Supine Short Arc Quad: AROM;Both;5 reps;Supine Heel Slides: PROM;Both;Supine;5 reps Hip ABduction/ADduction: AAROM;Both;10 reps;Supine Straight Leg Raises: AROM;Both;5 reps;Supine    General Comments General comments (skin integrity, edema, etc.): VSS per chart review, not further assessed as pt refusing OOB.      Pertinent Vitals/Pain Pain Assessment: Faces Faces Pain Scale: Hurts little more Pain Location: L leg and foot Pain Descriptors / Indicators: Discomfort;Grimacing;Guarding Pain Intervention(s): Limited activity within patient's tolerance;Monitored during session;Repositioned;Premedicated before session  PT Goals (current goals can now be  found in the care plan section) Acute Rehab PT Goals Patient Stated Goal: go to sleep PT Goal Formulation: With patient Time For Goal Achievement: 02/28/21 Progress towards PT goals: Not progressing toward goals - comment (limited participation)    Frequency    Min 3X/week      PT Plan Current plan remains appropriate    Co-evaluation              AM-PAC PT "6 Clicks" Mobility   Outcome Measure  Help needed turning from your back to your side while in a flat bed without using bedrails?: A Lot Help needed moving from lying on your back to sitting on the side of a flat bed without using bedrails?: A Lot Help needed moving to and from a bed to a chair (including a wheelchair)?: Total Help needed standing up from a chair using your arms (e.g., wheelchair or bedside chair)?: A Lot Help needed to walk in hospital room?: Total Help needed climbing 3-5 steps with a railing? : Total 6 Click Score: 9    End of Session   Activity Tolerance: Patient limited by fatigue Patient left: in bed;with call bell/phone within reach;with bed alarm set (heels floated) Nurse Communication: Mobility status;Other (comment) (pt refusing OOB) PT Visit Diagnosis: Unsteadiness on feet (R26.81);Other abnormalities of gait and mobility (R26.89);Difficulty in walking, not elsewhere classified (R26.2);Pain     Time: 5027-7412 PT Time Calculation (min) (ACUTE ONLY): 16 min  Charges:  $Therapeutic Exercise: 8-22 mins                     Kymberley Raz P., PTA Acute Rehabilitation Services Pager: [phone] Office: [place] 02/18/2021, 5:34 PM

## 2021-02-18 NOTE — Progress Notes (Signed)
Subjective  - POD #5, s/p left fem-BK pop BPG with vein and left 5th toe amp  Pain relatively controlled   Physical Exam:  Palpable DP pulse Dressing clean and dry       Assessment/Plan:  POD #5  Dispo to SNF Continue ASA and statin Encourage mobilization with mobility team and PT  Wells Savvas Roper 02/18/2021 7:41 AM --  Vitals:   02/18/21 0505 02/18/21 0729  BP: 133/76 140/84  Pulse: 72 73  Resp: 16 15  Temp: 98.2 F (36.8 C) 98.8 F (37.1 C)  SpO2: 98% 99%    Intake/Output Summary (Last 24 hours) at 02/18/2021 0741 Last data filed at 02/18/2021 0505 Gross per 24 hour  Intake 540 ml  Output 800 ml  Net -260 ml     Laboratory CBC    Component Value Date/Time   WBC 9.7 02/15/2021 0131   HGB 11.1 (L) 02/15/2021 0131   HGB 13.6 05/15/2015 1108   HCT 35.3 (L) 02/15/2021 0131   HCT 42.2 05/15/2015 1108   PLT 214 02/15/2021 0131   PLT 280 05/15/2015 1108    BMET    Component Value Date/Time   NA 140 02/14/2021 0503   NA 143 05/15/2015 1108   K 3.9 02/14/2021 0503   CL 107 02/14/2021 0503   CO2 22 02/14/2021 0503   GLUCOSE 128 (H) 02/14/2021 0503   BUN 19 02/14/2021 0503   BUN 13 05/15/2015 1108   CREATININE 0.75 02/14/2021 0503   CALCIUM 8.6 (L) 02/14/2021 0503   GFRNONAA >60 02/14/2021 0503   GFRAA >60 12/20/2017 1529    COAG Lab Results  Component Value Date   INR 1.0 02/06/2021   INR 1.05 05/23/2013   No results found for: PTT  Antibiotics Anti-infectives (From admission, onward)    Start     Dose/Rate Route Frequency Ordered Stop   02/14/21 1500  vancomycin (VANCOREADY) IVPB 1000 mg/200 mL  Status:  Discontinued        1,000 mg 200 mL/hr over 60 Minutes Intravenous Every 24 hours 02/13/21 1412 02/14/21 0902   02/13/21 1500  ceFEPIme (MAXIPIME) 2 g in sodium chloride 0.9 % 100 mL IVPB  Status:  Discontinued        2 g 200 mL/hr over 30 Minutes Intravenous Every 8 hours 02/13/21 1412 02/14/21 0902   02/13/21 1445  ceFAZolin  (ANCEF) IVPB 2g/100 mL premix  Status:  Discontinued        2 g 200 mL/hr over 30 Minutes Intravenous Every 8 hours 02/13/21 1358 02/13/21 1401   02/12/21 1030  vancomycin (VANCOREADY) IVPB 1000 mg/200 mL  Status:  Discontinued        1,000 mg 200 mL/hr over 60 Minutes Intravenous Every 24 hours 02/12/21 0935 02/13/21 1412   02/12/21 1030  ceFEPIme (MAXIPIME) 2 g in sodium chloride 0.9 % 100 mL IVPB  Status:  Discontinued        2 g 200 mL/hr over 30 Minutes Intravenous Every 8 hours 02/12/21 0935 02/13/21 1412   02/08/21 0630  vancomycin (VANCOREADY) IVPB 1000 mg/200 mL  Status:  Discontinued        1,000 mg 200 mL/hr over 60 Minutes Intravenous Every 24 hours 02/07/21 0841 02/08/21 1516   02/07/21 1600  cefTRIAXone (ROCEPHIN) 2 g in sodium chloride 0.9 % 100 mL IVPB  Status:  Discontinued        2 g 200 mL/hr over 30 Minutes Intravenous Every 24 hours 02/06/21 1013 02/07/21 0842  02/07/21 0930  cefTRIAXone (ROCEPHIN) 2 g in sodium chloride 0.9 % 100 mL IVPB  Status:  Discontinued        2 g 200 mL/hr over 30 Minutes Intravenous Every 24 hours 02/07/21 0841 02/08/21 1516   02/06/21 1900  vancomycin (VANCOREADY) IVPB 1000 mg/200 mL  Status:  Discontinued        1,000 mg 200 mL/hr over 60 Minutes Intravenous Every 12 hours 02/06/21 0734 02/07/21 0841   02/06/21 1600  metroNIDAZOLE (FLAGYL) IVPB 500 mg  Status:  Discontinued        500 mg 100 mL/hr over 60 Minutes Intravenous Every 8 hours 02/06/21 1013 02/07/21 1151   02/06/21 0745  ceFEPIme (MAXIPIME) 2 g in sodium chloride 0.9 % 100 mL IVPB  Status:  Discontinued        2 g 200 mL/hr over 30 Minutes Intravenous Every 8 hours 02/06/21 0734 02/06/21 1013   02/06/21 0745  vancomycin (VANCOREADY) IVPB 1750 mg/350 mL        1,750 mg 175 mL/hr over 120 Minutes Intravenous  Once 02/06/21 0734 02/06/21 1104        V. Leia Alf, M.D., [facility] [place] Office: [phone] Pager:  [phone]

## 2021-02-18 NOTE — Discharge Summary (Signed)
Name: Tiffany [name] MRN: [id number] DOB: 10-Oct-1950 70 y.o. PCP: Mardi Mainland, FNP  Date of Admission: 02/05/2021  9:21 PM Date of Discharge: 02/19/21 Attending Physician: Dr. Evette Doffing Discharge Diagnosis: Principal Problem:   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 ([place]) Active Problems:   Preoperative cardiovascular examination   Hypertension   Tobacco abuse   Diabetes mellitus with peripheral vascular disease ([place])   Gangrene ([place])   S/P femoral-popliteal bypass surgery   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 [facility])   Discharge Medications: Allergies as of 02/19/2021       Reactions   Cymbalta [duloxetine Hcl] Other (See Comments)   Sweating, bad dreams,        Medication List     STOP taking these medications    ALPRAZolam 0.5 MG tablet Commonly known as: XANAX   baclofen 10 MG tablet Commonly known as: LIORESAL   cloNIDine 0.1 MG tablet Commonly known as: CATAPRES   HYDROcodone-acetaminophen 10-325 MG tablet Commonly known as: Norco   ibuprofen 600 MG tablet Commonly known as: ADVIL   lisinopril 10 MG tablet Commonly known as: ZESTRIL   oseltamivir 75 MG capsule Commonly known as: Tamiflu   oxyCODONE-acetaminophen 7.5-325 MG tablet Commonly known as: PERCOCET Replaced by: oxyCODONE-acetaminophen 5-325 MG tablet       TAKE these medications    acetaminophen 325 MG tablet Commonly known as: TYLENOL Take 2 tablets (650 mg total) by mouth every 6 (six) hours as needed for mild pain (or Fever >/= 101).   amLODipine 10 MG tablet Commonly known as: NORVASC Take 1 tablet (10 mg total) by mouth daily. What changed:  medication strength how much to take   aspirin 81 MG EC tablet Take 1 tablet (81 mg total) by mouth daily. Swallow whole.   atorvastatin 40 MG tablet Commonly known as: LIPITOR Take 1 tablet (40 mg total) by mouth daily.   losartan 50 MG tablet Commonly known as: COZAAR Take 1 tablet (50 mg total) by mouth daily.    oxyCODONE-acetaminophen 5-325 MG tablet Commonly known as: PERCOCET/ROXICET Take 1 tablet by mouth every 8 (eight) hours as needed for up to 3 days for severe pain. Replaces: oxyCODONE-acetaminophen 7.5-325 MG tablet   pantoprazole 40 MG tablet Commonly known as: PROTONIX Take 1 tablet (40 mg total) by mouth daily.   rivaroxaban 10 MG Tabs tablet Commonly known as: XARELTO Take 1 tablet (10 mg total) by mouth daily. Please continue taking as long as you are immobile.   varenicline 0.5 MG tablet Commonly known as: Chantix Take 1 tablet (0.5 mg total) by mouth 2 (two) times daily.               Discharge Care Instructions  (From admission, onward)           Start     Ordered   02/19/21 0000  Leave dressing on - Keep it clean, dry, and intact until clinic visit        02/19/21 1114            Disposition and follow-up:   [name] was discharged from [facility] in Stable condition.  At the hospital follow up visit please address:  1.  Follow-up:  a.  Status post amputation dry gangrene of left fifth toe-assess wound/pain    b.  Status post left popliteal-femoral artery bypass surgery-assess wound/pain   c.  Hypertension-medication adherence   d.  Type 2 diabetes-repeat A1c in 3 months  e.  Tobacco use disorder-Chantix ordered, if unaffordable consider Wellbutrin  2.  Labs / imaging needed at time of follow-up: Hemoglobin A1C in 3 months  3.  Pending labs/ test needing follow-up: None  4.  Medication Changes  Started: Losartan, atorvastatin, aspirin, Percocet, Xarelto  Stopped: Lisinopril  Changed: Amlodipine to 10 mg  Abx -none  Follow-up Appointments:  Follow-up Information     [name], MD Follow up in 2 week(s).   Specialties: Vascular Surgery, Cardiology Contact information: [place] [address] [address], [place], [place]. Call.   Specialty: Nurse  Practitioner Why: Please call to make an appointment to be seen by your primary care provider Contact information: [place] [address] [phone]                 Hospital Course by problem list:  Dry gangrene left fifth toe PAD s/p left fem pop bypass  Ms. Bishara presented to the ED with dry gangrene of her left fifth toe, initial imaging to reveal bony involvement. She was intially treated with IV vanc and cefepime and vascular as well as orthopedic surgery were consulted.  During her hospitalization the left toe did appear to have some purulent drainage and worsening erythema around the base of the foot.  An angiogram was performed and revealed occlusion of the left SFA. She was then scheduled for LLE bypass and toe amputation. Initially it was requested she have cardiac clearance prior to her procedure, cardiology were consulted and offered the patient stress testing prior, however, the patient and her family declined. It was also determined that since the patient had no history of CAD, it was not necessary to have the stress testing done. The patient was then taken to the OR and had a left femoral-popliteal artery bypass performed as well as 5th left toe amputation. The toe was resected down to health bone. She tolerated the procedure well and was without complications.  Postoperatively her wounds were clean dry and intact.  Please see images below.  She continued to work with PT/OT who recommended SNF placement and patient was accepted at [facility]. Throughout her hospitlization the patient continued to have fluctuating mentation, thought to be secondary to hospital delirium as well as her left toe infection.  Due to her lack of mobility she was continued on DVT prophylaxis, Xarelto 10 mg daily and can be discontinued once patient has increased mobility.  Hypertension Patient with a past medical history on amlodipine 5 mg.  Blood pressures were persistently elevated  throughout hospitalization and she was started on losartan and her amlodipine was increased to 10 mg.  Her blood pressures improved during the hospitalization she was discharged on these medications.  Can consider transitioning to combination pill of amlodipine-ARB.  T2DM History of type 2 diabetes untreated.  A1c during admission was 6.1%.  Recommend repeat A1c in 3 months and assess whether or not patient needs to be started on medication for her diabetes.  Tobacco Use Disoder Patient with extensive history of tobacco use.  She is interested in quitting and we recommended Chantix at discharge.  If this is not covered by her insurance and she has trouble affording, can consider Wellbutrin as well.  Do not believe patient has a history of seizures or eating disorders.  Discharge Subjective: Ms. Insalaco is upset this morning. She feels angry that she "does not know what's going on with her body."  She notes shock when she saw her toe was amputated. Patient states she was not told that amputation was going to occur. We reassured patient that she was informed and consent was obtained, in addition to conversations with her daughter. Ms. Jeremiah cannot recall that her daughter Lyndle Herrlich was at bedside yesterday.  Continue to reassure the patient she will be going to a SNF facility is 1 day closer to going home.  Discharge Exam:   BP 136/85 (BP Location: Right Arm)   Pulse 80   Temp 98.1 F (36.7 C) (Oral)   Resp 13   Ht 5\' 2"  (1.575 m)   Wt 95.9 kg   SpO2 96%   BMI 38.67 kg/m  Constitutional: Resting comfortably in bed HENT: normocephalic atraumatic Eyes: conjunctiva non-erythematous Neck: supple Pulmonary/Chest: normal work of breathing on room air Neurological: alert & oriented but unaware of Skin: warm and dry Psych: Agitated         Pertinent Labs, Studies, and Procedures:  CBC Latest Ref Rng & Units 02/15/2021 02/14/2021 02/13/2021  WBC 4.0 - 10.5 K/uL 9.7 9.8 -  Hemoglobin 12.0 -  15.0 g/dL 11.1(L) 10.2(L) 10.9(L)  Hematocrit 36.0 - 46.0 % 35.3(L) 32.0(L) 32.0(L)  Platelets 150 - 400 K/uL 214 260 -    CMP Latest Ref Rng & Units 02/14/2021 02/13/2021 02/13/2021  Glucose 70 - 99 mg/dL 128(H) - 93  BUN 8 - 23 mg/dL 19 - 26(H)  Creatinine 0.44 - 1.00 mg/dL 0.75 - 1.23(H)  Sodium 135 - 145 mmol/L 140 141 138  Potassium 3.5 - 5.1 mmol/L 3.9 4.1 3.8  Chloride 98 - 111 mmol/L 107 - 104  CO2 22 - 32 mmol/L 22 - 23  Calcium 8.9 - 10.3 mg/dL 8.6(L) - 8.8(L)  Total Protein 6.5 - 8.1 g/dL - - -  Total Bilirubin 0.3 - 1.2 mg/dL - - -  Alkaline Phos 38 - 126 U/L - - -  AST 15 - 41 U/L - - -  ALT 0 - 44 U/L - - -    DG Foot Complete Left  Result Date: 02/05/2021 CLINICAL DATA:  Black 5th toe EXAM: LEFT FOOT - COMPLETE 3+ VIEW COMPARISON:  None. FINDINGS: Areas of lucency noted in the distal aspect of the left 5th toe proximal phalanx concerning for osteomyelitis. No fracture. Early degenerative changes with hallux valgus deformity at the 1st MTP joint. Soft tissues are intact. No soft tissue gas. IMPRESSION: Lucency/bone destruction in the distal aspect of the left 5th toe proximal phalanx concerning for osteomyelitis. Electronically Signed   By: Rolm Baptise M.D.   On: 02/05/2021 21:59     Discharge Instructions: Discharge Instructions     Call MD for:  difficulty breathing, headache or visual disturbances   Complete by: As directed    Call MD for:  extreme fatigue   Complete by: As directed    Call MD for:  hives   Complete by: As directed    Call MD for:  persistant dizziness or light-headedness   Complete by: As directed    Call MD for:  persistant nausea and vomiting   Complete by: As directed    Call MD for:  redness, tenderness, or signs of infection (pain, swelling, redness, odor or green/yellow discharge around incision site)   Complete by: As directed    Call MD for:  severe uncontrolled pain   Complete by: As directed    Call MD for:  temperature >100.4    Complete by: As directed  Diet - low sodium heart healthy   Complete by: As directed    Increase activity slowly   Complete by: As directed    Leave dressing on - Keep it clean, dry, and intact until clinic visit   Complete by: As directed        Signed: Riesa Pope, MD 02/19/2021, 11:26 AM   Pager: [phone]

## 2021-02-18 NOTE — Progress Notes (Signed)
HD#12 Subjective:  Overnight Events: None  Examined and evaluated at bedside this am. Noted to be resting in bed comfortably. Mentions sitting up at side of bed during the weekend. States she thinks her family visited her over the weekend but she is unable to remember. Her daughter joined the room during exam and she was updated on current clinical status. Advised on risk of deconditioning and importance of physical therapy.   Objective:  Vital signs in last 24 hours: Vitals:   02/17/21 1945 02/17/21 2315 02/18/21 0505 02/18/21 0605  BP: 129/73 128/75 133/76   Pulse: 75 74 72   Resp: _0 Temp: 98.2 F (36.8 C) 98.3 F (36.8 C) 98.2 F (36.8 C)   TempSrc: Oral Oral Oral   SpO2: 97% 97% 98%   Weight:    95.9 kg  Height:       Supplemental O2: Saturating well on room air  Physical Exam:  Constitutional: In no acute distress HENT: normocephalic atraumatic Eyes: conjunctiva non-erythematous.  Neck: supple Pulmonary/Chest: normal work of breathing on room air Neurological: alert & oriented x 3. Skin: warm and dry lower extremities. Left foot wrapped with bandage.  Surgical incision sites are clean dry and intact.  No hematoma or evidence of drainage.  No ecchymosis or erythema. Psych: Normal mood and thought process  Filed Weights   02/15/21 0300 02/16/21 0500 02/18/21 0605  Weight: 97.6 kg 97 kg 95.9 kg     Intake/Output Summary (Last 24 hours) at 02/18/2021 0715 Last data filed at 02/18/2021 0505 Gross per 24 hour  Intake 540 ml  Output 800 ml  Net -260 ml    Net IO Since Admission: -176.88 mL [02/18/21 0715]  Pertinent Labs: CBC Latest Ref Rng & Units 02/15/2021 02/14/2021 02/13/2021  WBC 4.0 - 10.5 K/uL 9.7 9.8 -  Hemoglobin 12.0 - 15.0 g/dL 11.1(L) 10.2(L) 10.9(L)  Hematocrit 36.0 - 46.0 % 35.3(L) 32.0(L) 32.0(L)  Platelets 150 - 400 K/uL 214 260 -    CMP Latest Ref Rng & Units 02/14/2021 02/13/2021 02/13/2021  Glucose 70 - 99 mg/dL 128(H) - 93  BUN 8 -  23 mg/dL 19 - 26(H)  Creatinine 0.44 - 1.00 mg/dL 0.75 - 1.23(H)  Sodium 135 - 145 mmol/L 140 141 138  Potassium 3.5 - 5.1 mmol/L 3.9 4.1 3.8  Chloride 98 - 111 mmol/L 107 - 104  CO2 22 - 32 mmol/L 22 - 23  Calcium 8.9 - 10.3 mg/dL 8.6(L) - 8.8(L)  Total Protein 6.5 - 8.1 g/dL - - -  Total Bilirubin 0.3 - 1.2 mg/dL - - -  Alkaline Phos 38 - 126 U/L - - -  AST 15 - 41 U/L - - -  ALT 0 - 44 U/L - - -    Imaging: No results found.  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 with a pertinent PMH of type 2 diabetes, tobacco use disorder, hypertension, who presented with pain of left fifth toe and admitted for dry gangrenous fifth left toe   Dry gangrene, left fifth toe s/p amputation of fifth  PAD s/p left femoral-popliteal artery bypass Patient  postop day 6 of left fifth toe amputation and left femoral-popliteal artery bypass.  Patient notes that her pain is well controlled.  She has up ambulating to the bedside recliner.  Currently pending SNF placement per recommendations from physical as well as Occupational Therapy.  Patient and daughter met with social work team and have chosen [facility].  Pending insurance authorization.  It appears as though is that [facility] have availability at this time. -Appreciate vascular and orthopedic surgeries assistance -Pain control - Tylenol 650 mg q6h PRN for mild to moderate pain, Percocet 5-325 mg q4h PRN for severe pain -Continue high intensity statin, Atorvastatin 40 mg daily -Continue daily aspirin 81 mg -Advance diet to carb modified as tolerable -PT/OT recommend SNF   Hypertension Blood pressure  of 127/73.  Continue current management of -Amlodipine 10 mg daily -Losartan 50 mg daily  Type 2 diabetes Hx ot T2DM, untreated prior to admission. A1c during admission of 6.1%. CBGs are within goal during hospitalization thus far. -Continue SSI   Tobacco use disorder Patient with extensive history of tobacco use.  Patient interested in quitting, will continue to discuss starting Chantix at discharge.  Obstructive sleep apnea Patient continuing to refuse CPAP and per RT note, patient stated she does not wear CPAP at home.  We will discontinue the order  Diet: Carb-Modified IVF: None,None VTE: Enoxaparin Code: Full PT/OT recs: SNF, 3 and 1 TOC recs: None at this time  Dispo: Anticipated discharge to SNF pending placement pending insurance authorization  [place] [facility] Resident PGY-1 Pager [phone] Please contact the on call pager after 5 pm and on weekends at [phone].

## 2021-02-19 LAB — GLUCOSE, CAPILLARY
Glucose-Capillary: 97 mg/dL (ref 70–99)
Glucose-Capillary: 125 mg/dL — ABNORMAL HIGH (ref 70–99)
Glucose-Capillary: 126 mg/dL — ABNORMAL HIGH (ref 70–99)

## 2021-02-19 MED ORDER — OXYCODONE-ACETAMINOPHEN 5-325 MG PO TABS: 1.0000 | ORAL_TABLET | Freq: Three times a day (TID) | ORAL | 0 refills | Status: AC | PRN

## 2021-02-19 MED ORDER — ACETAMINOPHEN 325 MG PO TABS: 650.0000 mg | ORAL_TABLET | Freq: Four times a day (QID) | ORAL | Status: DC | PRN

## 2021-02-19 MED ORDER — AMLODIPINE BESYLATE 10 MG PO TABS: 10.0000 mg | ORAL_TABLET | Freq: Every day | ORAL | 0 refills | Status: DC

## 2021-02-19 MED ORDER — ASPIRIN 81 MG PO TBEC: 81.0000 mg | DELAYED_RELEASE_TABLET | Freq: Every day | ORAL | 11 refills | Status: DC

## 2021-02-19 MED ORDER — LOSARTAN POTASSIUM 50 MG PO TABS: 50.0000 mg | ORAL_TABLET | Freq: Every day | ORAL | 0 refills | Status: DC

## 2021-02-19 MED ORDER — VARENICLINE TARTRATE 0.5 MG PO TABS: 0.5000 mg | ORAL_TABLET | Freq: Two times a day (BID) | ORAL | 0 refills | Status: DC

## 2021-02-19 MED ORDER — ATORVASTATIN CALCIUM 40 MG PO TABS: 40.0000 mg | ORAL_TABLET | Freq: Every day | ORAL | 0 refills | Status: DC

## 2021-02-19 MED ORDER — RIVAROXABAN 10 MG PO TABS: 10.0000 mg | ORAL_TABLET | Freq: Every day | ORAL | 0 refills | Status: DC

## 2021-02-19 NOTE — Progress Notes (Signed)
L foot dressing changed, wet to dry, wrapped with kerlex. Pt tolerated well

## 2021-02-19 NOTE — TOC Transition Note (Signed)
Transition of Care [facility]) - CM/SW Discharge Note   Patient Details  Name: Tiffany [name] MRN: [id number] Date of Birth: 1950/11/15  Transition of Care [facility]) CM/SW Contact:  Vinie Sill, LCSW Phone Number: 02/19/2021, 1:03 PM   Clinical Narrative:     Patient will Discharge to: [facility] Discharge Date: 02/19/2021 Family Notified: daughter,Arlie Transport By: Corey Harold  Per MD patient is ready for discharge. RN, patient, and facility notified of discharge. Discharge Summary sent to facility. RN given number for [phone]. Ambulance transport requested for patient.   Clinical Social Worker signing off.  Thurmond Butts, MSW, LCSW Clinical Social Worker    Final next level of care: Skilled Nursing Facility Barriers to Discharge: Barriers Resolved   Patient Goals and CMS Choice        Discharge Placement              Patient chooses bed at: [facility] Patient to be transferred to facility by: [place] Name of family member notified: daughter Patient and family notified of of transfer: 02/19/21  Discharge Plan and Services In-house Referral: Clinical Social Work                                   Social Determinants of Health (SDOH) Interventions     Readmission Risk Interventions No flowsheet data found.

## 2021-02-19 NOTE — Discharge Instructions (Addendum)
Tiffany [name],  Thanks for allowing [place] to care for you during your hospitalization.  You were found to have necrosis of your left fifth toe.  You were initially treated with IV antibiotics, vascular surgery also evaluated you and saw that the blood flow to the leg was not adequate.  The procedure was done to increase the blood flow and then the toe was amputated.  He tolerated the procedure well and denied any complications.  He will be discharged to a skilled nursing facility.  I am glad that you are doing well please follow-up with your primary care provider.  Thank you again Dr. Johnney Ou      [facility]  Discharge instructions  Lower Extremity Bypass Surgery  Please refer to the following instruction for your post-procedure care. Your surgeon or physician assistant will discuss any changes with you.  Activity  You are encouraged to walk as much as you can. You can slowly return to normal activities during the month after your surgery. Avoid strenuous activity and heavy lifting until your doctor tells you it's OK. Avoid activities such as vacuuming or swinging a golf club. Do not drive until your doctor give the OK and you are no longer taking prescription pain medications. It is also normal to have difficulty with sleep habits, eating and bowel movement after surgery. These will go away with time.  Bathing/Showering  Shower daily after you go home. Do not soak in a bathtub, hot tub, or swim until the incision heals completely.  Incision Care  Clean your incision with mild soap and water. Shower every day. Pat the area dry with a clean towel. You do not need a bandage unless otherwise instructed. Do not apply any ointments or creams to your incision. If you have open wounds you will be instructed how to care for them or a visiting nurse may be arranged for you. If you have staples or sutures along your incision they will be removed at your post-op  appointment. You may have skin glue on your incision. Do not peel it off. It will come off on its own in about one week.  Wash the groin wound with soap and water daily and pat dry. (No tub bath-only shower)  Then put a dry gauze or washcloth in the groin to keep this area dry to help prevent wound infection.  Do this daily and as needed.  Do not use Vaseline or neosporin on your incisions.  Only use soap and water on your incisions and then protect and keep dry.  Diet  Resume your normal diet. There are no special food restrictions following this procedure. A low fat/ low cholesterol diet is recommended for all patients with vascular disease. In order to heal from your surgery, it is CRITICAL to get adequate nutrition. Your body requires vitamins, minerals, and protein. Vegetables are the best source of vitamins and minerals. Vegetables also provide the perfect balance of protein. Processed food has little nutritional value, so try to avoid this.  Medications  Resume taking all your medications unless your doctor or physician assistant tells you not to. If your incision is causing pain, you may take over-the-counter pain relievers such as acetaminophen (Tylenol). If you were prescribed a stronger pain medication, please aware these medication can cause nausea and constipation. Prevent nausea by taking the medication with a snack or meal. Avoid constipation by drinking plenty of fluids and eating foods with high amount of fiber, such as fruits, vegetables,  and grains. Take Colace 100 mg (an over-the-counter stool softener) twice a day as needed for constipation.  Do not take Tylenol if you are taking prescription pain medications.  Follow Up  Our office will schedule a follow up appointment 2-3 weeks following discharge.  Please call us immediately for any of the following conditions  Severe or worsening pain in your legs or feet while at rest or while walking Increase pain, redness, warmth, or  drainage (pus) from your incision site(s) Fever of 101 degree or higher The swelling in your leg with the bypass suddenly worsens and becomes more painful than when you were in the hospital If you have been instructed to feel your graft pulse then you should do so every day. If you can no longer feel this pulse, call the office immediately. Not all patients are given this instruction.  Leg swelling is common after leg bypass surgery.  The swelling should improve over a few months following surgery. To improve the swelling, you may elevate your legs above the level of your heart while you are sitting or resting. Your surgeon or physician assistant may ask you to apply an ACE wrap or wear compression (TED) stockings to help to reduce swelling.  Reduce your risk of vascular disease  Stop smoking. If you would like help call [facility] at 1-800-QUIT-NOW 5021208374) or [facility] at 272-497-3800.  Manage your cholesterol Maintain a desired weight Control your diabetes weight Control your diabetes Keep your blood pressure down  If you have any questions, please call the office at [phone]

## 2021-02-19 NOTE — Progress Notes (Signed)
Occupational Therapy Treatment Patient Details Name: Tiffany [name] MRN: [id number] DOB: 1951-06-04 Today's Date: 02/19/2021    History of present illness 70 y.o. female who presented with left fifth toe gangrene and osteomyelitis. Pt initially underwent abdominal aortogram of L LE on 6/17. 6/22 s/p Left common femoral to BK popliteal artery BPG and Left fifth toe amputation including metatarsal head. PMH: DM II, GERD, HTN, L TKA.   OT comments  Pt making incremental progress towards OT goals this session. Pt reports being upset about her encounter with her doctors therefore pt flat during session and declined OOB activity. Pt agreeable to EOB only for ADLS. Pt currently required MIN A - min guard assist for bed mobility, set- up for grooming tasks and supervision for static sitting balance. Pt would continue to benefit from skilled occupational therapy while admitted and after d/c to address the below listed limitations in order to improve overall functional mobility and facilitate independence with BADL participation. DC plan remains appropriate, will follow acutely per POC.     Follow Up Recommendations  SNF;Supervision/Assistance - 24 hour    Equipment Recommendations  3 in 1 bedside commode    Recommendations for Other Services      Precautions / Restrictions Precautions Precautions: Fall Required Braces or Orthoses: Other Brace Other Brace: L darco shoe Restrictions Weight Bearing Restrictions: Yes LLE Weight Bearing: Partial weight bearing Other Position/Activity Restrictions: heel WB in post-op darco shoe       Mobility Bed Mobility Overal bed mobility: Needs Assistance Bed Mobility: Supine to Sit;Sit to Supine     Supine to sit: Min assist;HOB elevated Sit to supine: Min guard;HOB elevated   General bed mobility comments: pt required MIN A to fully scoot hips to EOB using bed pad, min guard to return to supine for safety and line mgmt, increased time and effort  noted during all bed mobility    Transfers                 General transfer comment: pt declined    Balance Overall balance assessment: Needs assistance Sitting-balance support: Feet supported;Single extremity supported Sitting balance-Leahy Scale: Fair Sitting balance - Comments: sitting EOB with one UE supported for ADLs with supervision                                   ADL either performed or assessed with clinical judgement   ADL Overall ADL's : Needs assistance/impaired     Grooming: Wash/dry face;Sitting;Set up Grooming Details (indicate cue type and reason): sitting EOB                             Functional mobility during ADLs: Minimal assistance (bed mobility only) General ADL Comments: pt declined OOB mobility reporting that she was too angry to get OOB, agreeable to EOB ADLs     Vision       Perception     Praxis      Cognition Arousal/Alertness: Awake/alert Behavior During Therapy: Flat affect (reports being internally angry as pt reports she just had to yell at her doctors) Overall Cognitive Status: No family/caregiver present to determine baseline cognitive functioning Area of Impairment: Attention;Memory;Following commands;Safety/judgement;Problem solving                   Current Attention Level: Selective Memory: Decreased short-term memory (repeated cues) Following Commands: Follows one  step commands with increased time Safety/Judgement: Decreased awareness of safety;Decreased awareness of deficits Awareness: Intellectual Problem Solving: Slow processing;Decreased initiation;Difficulty sequencing;Requires verbal cues;Requires tactile cues General Comments: pt very flat reporting that she could not get OOB as she was too upset, she reports that she had to yell at her doctors and made one cry? pt slow to follow commands needing increased time to process but likely internally distracted        Exercises      Shoulder Instructions       General Comments pt LLE wrapped and dressing appeared clean and dry    Pertinent Vitals/ Pain       Pain Assessment: Faces Faces Pain Scale: Hurts a little bit Pain Location: L leg and foot during bed mobility Pain Descriptors / Indicators: Discomfort;Grimacing;Guarding Pain Intervention(s): Limited activity within patient's tolerance;Monitored during session;Repositioned  Home Living                                          Prior Functioning/Environment              Frequency  Min 2X/week        Progress Toward Goals  OT Goals(current goals can now be found in the care plan section)  Progress towards OT goals: Progressing toward goals  Acute Rehab OT Goals Patient Stated Goal: to not get OOB OT Goal Formulation: With patient Time For Goal Achievement: 02/26/21 Potential to Achieve Goals: [place] Discharge plan remains appropriate;Frequency remains appropriate    Co-evaluation                 AM-PAC OT "6 Clicks" Daily Activity     Outcome Measure   Help from another person eating meals?: None Help from another person taking care of personal grooming?: A Little Help from another person toileting, which includes using toliet, bedpan, or urinal?: A Lot Help from another person bathing (including washing, rinsing, drying)?: A Lot Help from another person to put on and taking off regular upper body clothing?: None Help from another person to put on and taking off regular lower body clothing?: Total 6 Click Score: 16    End of Session    OT Visit Diagnosis: Unsteadiness on feet (R26.81);Other abnormalities of gait and mobility (R26.89);Muscle weakness (generalized) (M62.81);Other symptoms and signs involving cognitive function;Pain Pain - Right/Left: Left Pain - part of body: Ankle and joints of foot;Leg   Activity Tolerance Other (comment) (limited by anger from encounter with doctors)   Patient Left  in bed;with call bell/phone within reach;with bed alarm set   Nurse Communication Mobility status;Other (comment) (relayed to NT tolerance to session)        Time: 7903-8333 OT Time Calculation (min): 11 min  Charges: OT General Charges $OT Visit: 1 Visit OT Treatments $Self Care/Home Management : 8-22 mins  Harley Alto., COTA/L Acute Rehabilitation Services [phone]    Precious Haws 02/19/2021, 10:41 AM

## 2021-02-19 NOTE — Progress Notes (Addendum)
  Progress Note    02/19/2021 7:32 AM 6 Days Post-Op  Subjective:  no major complaints   Vitals:   02/18/21 2318 02/19/21 0356  BP: 105/63 126/73  Pulse: 66 63  Resp: 18 14  Temp: 98 F (36.7 C) (!) 97.3 F (36.3 C)  SpO2: 99% 98%   Physical Exam: Cardiac:  regular Lungs:  non labored Incisions:  left groin, left leg incisions are clean, dry and intact. Left foot dressings clean and dry Extremities:  well perfused. Doppler AT signal Neurologic: alert and oriented  CBC    Component Value Date/Time   WBC 9.7 02/15/2021 0131   RBC 4.08 02/15/2021 0131   HGB 11.1 (L) 02/15/2021 0131   HGB 13.6 05/15/2015 1108   HCT 35.3 (L) 02/15/2021 0131   HCT 42.2 05/15/2015 1108   PLT 214 02/15/2021 0131   PLT 280 05/15/2015 1108   MCV 86.5 02/15/2021 0131   MCV 84 05/15/2015 1108   MCH 27.2 02/15/2021 0131   MCHC 31.4 02/15/2021 0131   RDW 15.0 02/15/2021 0131   RDW 15.4 05/15/2015 1108   LYMPHSABS 1.3 02/12/2021 1427   LYMPHSABS 1.6 05/15/2015 1108   MONOABS 0.7 02/12/2021 1427   EOSABS 0.1 02/12/2021 1427   EOSABS 0.1 05/15/2015 1108   BASOSABS 0.0 02/12/2021 1427   BASOSABS 0.0 05/15/2015 1108    BMET    Component Value Date/Time   NA 140 02/14/2021 0503   NA 143 05/15/2015 1108   K 3.9 02/14/2021 0503   CL 107 02/14/2021 0503   CO2 22 02/14/2021 0503   GLUCOSE 128 (H) 02/14/2021 0503   BUN 19 02/14/2021 0503   BUN 13 05/15/2015 1108   CREATININE 0.75 02/14/2021 0503   CALCIUM 8.6 (L) 02/14/2021 0503   GFRNONAA >60 02/14/2021 0503   GFRAA >60 12/20/2017 1529    INR    Component Value Date/Time   INR 1.0 02/06/2021 0943     Intake/Output Summary (Last 24 hours) at 02/19/2021 0732 Last data filed at 02/18/2021 2325 Gross per 24 hour  Intake --  Output 1850 ml  Net -1850 ml     Assessment/Plan:  70 y.o. female is s/p left fem- BK pop BPG with vein and 5th toe amp  6 Days Post-Op   Left leg well perfused with brisk AT doppler signal. Left leg  incisions are clean, dry and intact. BID wet to dry left foot. Okay to discharge from vascular standpoint. Discharging today to [facility] today. Follow up has been arranged on 7/11 at [facility]   Karoline Caldwell, PA-C [facility] [phone] 02/19/2021 7:32 AM

## 2021-02-19 NOTE — Progress Notes (Signed)
Attempted to call report x2 to [facility] with no answer.

## 2021-02-23 ENCOUNTER — Emergency Department (HOSPITAL_COMMUNITY)
Admission: EM | Admit: 2021-02-23 | Discharge: 2021-02-24 | Disposition: A | Discharge status: Home / Self Care | Payer: Medicare (Managed Care) | Attending: Emergency Medicine | Admitting: Emergency Medicine

## 2021-02-23 ENCOUNTER — Other Ambulatory Visit: Payer: Self-pay

## 2021-02-23 ENCOUNTER — Emergency Department (HOSPITAL_COMMUNITY): Payer: Medicare (Managed Care)

## 2021-02-23 ENCOUNTER — Encounter (HOSPITAL_COMMUNITY): Payer: Self-pay

## 2021-02-23 DIAGNOSIS — I10 Essential (primary) hypertension: Secondary | ICD-10-CM | POA: Diagnosis not present

## 2021-02-23 DIAGNOSIS — E1151 Type 2 diabetes mellitus with diabetic peripheral angiopathy without gangrene: Secondary | ICD-10-CM | POA: Diagnosis not present

## 2021-02-23 DIAGNOSIS — Z96652 Presence of left artificial knee joint: Secondary | ICD-10-CM | POA: Diagnosis not present

## 2021-02-23 DIAGNOSIS — K5900 Constipation, unspecified: Secondary | ICD-10-CM | POA: Diagnosis not present

## 2021-02-23 DIAGNOSIS — R109 Unspecified abdominal pain: Secondary | ICD-10-CM | POA: Diagnosis present

## 2021-02-23 DIAGNOSIS — K219 Gastro-esophageal reflux disease without esophagitis: Secondary | ICD-10-CM | POA: Insufficient documentation

## 2021-02-23 DIAGNOSIS — F17210 Nicotine dependence, cigarettes, uncomplicated: Secondary | ICD-10-CM | POA: Insufficient documentation

## 2021-02-23 DIAGNOSIS — Z7901 Long term (current) use of anticoagulants: Secondary | ICD-10-CM | POA: Insufficient documentation

## 2021-02-23 DIAGNOSIS — Z79899 Other long term (current) drug therapy: Secondary | ICD-10-CM | POA: Insufficient documentation

## 2021-02-23 DIAGNOSIS — R0602 Shortness of breath: Secondary | ICD-10-CM | POA: Diagnosis not present

## 2021-02-23 DIAGNOSIS — Z7982 Long term (current) use of aspirin: Secondary | ICD-10-CM | POA: Diagnosis not present

## 2021-02-23 LAB — COMPREHENSIVE METABOLIC PANEL
ALT: 48 U/L — ABNORMAL HIGH (ref 0–44)
AST: 30 U/L (ref 15–41)
Albumin: 2.8 g/dL — ABNORMAL LOW (ref 3.5–5.0)
Alkaline Phosphatase: 78 U/L (ref 38–126)
Anion gap: 10 (ref 5–15)
BUN: 18 mg/dL (ref 8–23)
CO2: 26 mmol/L (ref 22–32)
Calcium: 8.3 mg/dL — ABNORMAL LOW (ref 8.9–10.3)
Chloride: 105 mmol/L (ref 98–111)
Creatinine, Ser: 0.8 mg/dL (ref 0.44–1.00)
GFR, Estimated: >60 mL/min (ref >60)
Glucose, Bld: 90 mg/dL (ref 70–99)
Potassium: 4.4 mmol/L (ref 3.5–5.1)
Sodium: 141 mmol/L (ref 135–145)
Total Bilirubin: 0.7 mg/dL (ref 0.3–1.2)
Total Protein: 6.6 g/dL (ref 6.5–8.1)

## 2021-02-23 LAB — I-STAT CHEM 8, ED
BUN: 25 mg/dL — ABNORMAL HIGH (ref 8–23)
Calcium, Ion: 1.07 mmol/L — ABNORMAL LOW (ref 1.15–1.40)
Chloride: 106 mmol/L (ref 98–111)
Creatinine, Ser: 0.8 mg/dL (ref 0.44–1.00)
Glucose, Bld: 99 mg/dL (ref 70–99)
HCT: 35 % — ABNORMAL LOW (ref 36.0–46.0)
Hemoglobin: 11.9 g/dL — ABNORMAL LOW (ref 12.0–15.0)
Potassium: 4.6 mmol/L (ref 3.5–5.1)
Sodium: 139 mmol/L (ref 135–145)
TCO2: 26 mmol/L (ref 22–32)

## 2021-02-23 LAB — CBC WITH DIFFERENTIAL/PLATELET
Abs Immature Granulocytes: 0.04 10*3/uL (ref 0.00–0.07)
Basophils Absolute: 0 10*3/uL (ref 0.0–0.1)
Basophils Relative: 0 %
Eosinophils Absolute: 0.1 10*3/uL (ref 0.0–0.5)
Eosinophils Relative: 2 %
HCT: 35.8 % — ABNORMAL LOW (ref 36.0–46.0)
Hemoglobin: 10.8 g/dL — ABNORMAL LOW (ref 12.0–15.0)
Immature Granulocytes: 1 %
Lymphocytes Relative: 24 %
Lymphs Abs: 1.7 10*3/uL (ref 0.7–4.0)
MCH: 26.9 pg (ref 26.0–34.0)
MCHC: 30.2 g/dL (ref 30.0–36.0)
MCV: 89.1 fL (ref 80.0–100.0)
Monocytes Absolute: 0.4 10*3/uL (ref 0.1–1.0)
Monocytes Relative: 5 %
Neutro Abs: 4.7 10*3/uL (ref 1.7–7.7)
Neutrophils Relative %: 68 %
Platelets: 353 10*3/uL (ref 150–400)
RBC: 4.02 MIL/uL (ref 3.87–5.11)
RDW: 14.8 % (ref 11.5–15.5)
WBC: 7 10*3/uL (ref 4.0–10.5)
nRBC: 0 % (ref 0.0–0.2)

## 2021-02-23 LAB — TROPONIN I (HIGH SENSITIVITY): Troponin I (High Sensitivity): 10 ng/L (ref <18)

## 2021-02-23 LAB — LIPASE, BLOOD: Lipase: 34 U/L (ref 11–51)

## 2021-02-23 MED ORDER — SODIUM CHLORIDE 0.9 % IV BOLUS
1000.0000 mL | Freq: Once | INTRAVENOUS | Status: AC
  Administered 2021-02-23: 1000 mL via INTRAVENOUS

## 2021-02-23 MED ORDER — IOHEXOL 350 MG/ML SOLN
100.0000 mL | Freq: Once | INTRAVENOUS | Status: AC | PRN
  Administered 2021-02-23: 100 mL via INTRAVENOUS

## 2021-02-23 MED ORDER — POLYETHYLENE GLYCOL 3350 17 G PO PACK: 17.0000 g | PACK | Freq: Every day | ORAL | 0 refills | Status: DC

## 2021-02-23 NOTE — ED Notes (Signed)
PTAR was called extremely long list

## 2021-02-23 NOTE — Discharge Instructions (Addendum)
Take MiraLAX daily for constipation.  Follow-up with your doctor.  If you are unhappy with the facility, you can talk to one of the social workers and requested transfer to a different facility.  Return to ER if you have worse constipation, chest pain, abdominal pain

## 2021-02-23 NOTE — ED Notes (Signed)
Received verbal report from [place] N. RN at this time

## 2021-02-23 NOTE — ED Notes (Signed)
[place] reference d/c instructions no answer at this time

## 2021-02-23 NOTE — ED Notes (Signed)
Pt incontinent on self and had a bm. Pt cleaned and changed with a purewick placed at this time. Pt also adjusted in bed

## 2021-02-23 NOTE — ED Triage Notes (Signed)
Pt BIB [facility] C/O abdominal pain X 4 days. Last BM 2 weeks ago. States "not really" passing gas.

## 2021-02-23 NOTE — ED Notes (Signed)
Pt daughter at [place]. She received d/c papers and prescription for pt at this time. Secretary contacted to request [facility]AR at this time for transportation back to [facility]. Charge nurse made aware

## 2021-02-23 NOTE — ED Notes (Signed)
Provided pt with bag lunch.

## 2021-02-23 NOTE — ED Notes (Signed)
Pt transported to CT via stretcher at this time.  

## 2021-02-23 NOTE — ED Notes (Signed)
Provider at bedside

## 2021-02-23 NOTE — ED Provider Notes (Signed)
[facility] EMERGENCY DEPARTMENT Provider Note   CSN: [id number] Arrival date & time: 02/23/21  1608     History Chief Complaint  Patient presents with   Abdominal Pain    Taneisha A Espinola is a 70 y.o. female history of hypertension, recent left femoropopliteal bypass for osteomyelitis, here presenting with abdominal pain and shortness of breath.  Patient was discharged from the hospital about 2 weeks ago.  Patient states that she has no bowel movement for the last couple weeks.  She also told me that she has some shortness of breath as well.  Denies any chest pain.   The history is provided by the patient.      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[place]) 02/13/2021   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 ([place]) 02/06/2021   Gangrene ([place])    Diabetes mellitus with peripheral vascular disease ([place]) 09/13/2015   Generalized anxiety disorder 06/15/2014   Chronic pain 12/28/2013   GERD (gastroesophageal reflux disease) 09/01/2013   Tobacco abuse 09/01/2013   Hypertension    Expected blood loss anemia 06/01/2013   Morbid obesity ([place]) 06/01/2013   S/P left TKA 05/31/2013   Preoperative cardiovascular examination 02/18/2013    Past Surgical History:  Procedure Laterality Date   ABDOMINAL AORTOGRAM W/LOWER EXTREMITY N/A 02/08/2021   Procedure: ABDOMINAL AORTOGRAM W/LOWER EXTREMITY;  Surgeon: Elam Dutch, MD;  Location: [facility];  Service: Cardiovascular;  Laterality: N/A;   AMPUTATION Left 02/13/2021   Procedure: FIFTH TOE AMPUTATION;  Surgeon: [name], MD;  Location: [facility] OR;  Service: Vascular;  Laterality: Left;   FEMORAL-POPLITEAL BYPASS GRAFT Left 02/13/2021   Procedure: BYPASS  GRAFT LEFT FEMORAL-POPLITEAL ARTERY;  Surgeon: [name], MD;  Location: [facility];  Service: Vascular;  Laterality: Left;   KNEE SURGERY     arthroscopic right   toe nail removal     TOTAL KNEE ARTHROPLASTY Left 05/31/2013   Procedure: LEFT TOTAL KNEE ARTHROPLASTY;  Surgeon: Mauri Pole, MD;  Location: [facility];  Service: Orthopedics;  Laterality: Left;   VEIN HARVEST Left 02/13/2021   Procedure: VEIN HARVEST LEFT GREATER SAPHENOUS VEIN;  Surgeon: [name], MD;  Location: [facility] OR;  Service: Vascular;  Laterality: Left;     OB History   No obstetric history on file.     Family History  Problem Relation Age of Onset   Heart disease Father        Died of CHF age 6   Heart disease Mother        Died of CHF age 34   Cancer Brother        Cancer   Heart failure Brother        Died of CHF    High blood pressure Son    Colon cancer Neg Hx     Social History   Tobacco Use   Smoking status: Every Day    Packs/day: 0.25    Years: 20.00    Pack years: 5.00    Types: Cigarettes   Smokeless tobacco: Never   Tobacco comments:    5 cig daily   Substance Use Topics   Alcohol use:  No    Alcohol/week: 0.0 standard drinks   Drug use: No    Home Medications Prior to Admission medications   Medication Sig Start Date End Date Taking? Authorizing Provider  acetaminophen (TYLENOL) 325 MG tablet Take 2 tablets (650 mg total) by mouth every 6 (six) hours as needed for mild pain (or Fever >/= 101). 02/19/21   Katsadouros, Vasilios, MD  amLODipine (NORVASC) 10 MG tablet Take 1 tablet (10 mg total) by mouth daily. 02/19/21   Riesa Pope, MD  aspirin EC 81 MG EC tablet Take 1 tablet (81 mg total) by mouth daily. Swallow whole. 02/19/21   Riesa Pope, MD  atorvastatin (LIPITOR) 40 MG tablet Take 1 tablet (40 mg total) by mouth daily. 02/19/21   Riesa Pope, MD  losartan (COZAAR) 50 MG tablet Take 1 tablet (50 mg total) by mouth daily. 02/19/21   Katsadouros,  Vasilios, MD  pantoprazole (PROTONIX) 40 MG tablet Take 1 tablet (40 mg total) by mouth daily. 03/06/16   Lauree Chandler, NP  rivaroxaban (XARELTO) 10 MG TABS tablet Take 1 tablet (10 mg total) by mouth daily. Please continue taking as long as you are immobile. 02/19/21 03/21/21  Riesa Pope, MD  varenicline (CHANTIX) 0.5 MG tablet Take 1 tablet (0.5 mg total) by mouth 2 (two) times daily. 02/19/21   Riesa Pope, MD    Allergies    Cymbalta [duloxetine hcl]  Review of Systems   Review of Systems  Gastrointestinal:  Positive for abdominal pain.  All other systems reviewed and are negative.  Physical Exam Updated Vital Signs BP 130/82 (BP Location: Right Arm)   Pulse 72   Temp 98.4 F (36.9 C) (Oral)   Resp 15   Ht 5\' 2"  (1.575 m)   Wt 102.1 kg   SpO2 100%   BMI 41.15 kg/m   Physical Exam Vitals and nursing note reviewed.  Constitutional:      Comments: Chronically ill  HENT:     Head: Normocephalic.  Eyes:     Extraocular Movements: Extraocular movements intact.     Pupils: Pupils are equal, round, and reactive to light.  Cardiovascular:     Rate and Rhythm: Normal rate and regular rhythm.  Abdominal:     General: Abdomen is flat.     Palpations: Abdomen is soft.  Genitourinary:    Comments: Rectal- no obvious stool impaction  Skin:    General: Skin is warm.     Capillary Refill: Capillary refill takes less than 2 seconds.     Comments: Patient's left femoropopliteal bypass site appears to be healing well.  She has good left DP pulse.  Patient has dressing of the left foot which I did not unwrap.  Patient is able to wiggle her toes.  Neurological:     General: No focal deficit present.     Mental Status: She is alert and oriented to person, place, and time.  Psychiatric:        Mood and Affect: Mood normal.        Behavior: Behavior normal.    ED Results / Procedures / Treatments   Labs (all labs ordered are listed, but only abnormal results  are displayed) Labs Reviewed  CBC WITH DIFFERENTIAL/PLATELET - Abnormal; Notable for the following components:      Result Value   Hemoglobin 10.8 (*)    HCT 35.8 (*)    All other components within normal limits  COMPREHENSIVE METABOLIC PANEL - Abnormal; Notable for the following components:  Calcium 8.3 (*)    Albumin 2.8 (*)    ALT 48 (*)    All other components within normal limits  I-STAT CHEM 8, ED - Abnormal; Notable for the following components:   BUN 25 (*)    Calcium, Ion 1.07 (*)    Hemoglobin 11.9 (*)    HCT 35.0 (*)    All other components within normal limits  LIPASE, BLOOD  URINALYSIS, ROUTINE W REFLEX MICROSCOPIC  TROPONIN I (HIGH SENSITIVITY)    EKG EKG Interpretation  Date/Time:  Saturday February 23 2021 16:59:33 EDT Ventricular Rate:  61 PR Interval:  141 QRS Duration: 86 QT Interval:  459 QTC Calculation: 463 R Axis:   25 Text Interpretation: Sinus rhythm Borderline repolarization abnormality No significant change since last tracing Confirmed by Wandra Arthurs [phone]) on 02/23/2021 5:34:34 PM  Radiology CT Angio Chest PE W and/or Wo Contrast  Result Date: 02/23/2021 CLINICAL DATA:  PE suspected high probability EXAM: CT ANGIOGRAPHY CHEST WITH CONTRAST TECHNIQUE: Multidetector CT imaging of the chest was performed using the standard protocol during bolus administration of intravenous contrast. Multiplanar CT image reconstructions and MIPs were obtained to evaluate the vascular anatomy. CONTRAST:  142mL OMNIPAQUE IOHEXOL 350 MG/ML SOLN COMPARISON:  None. FINDINGS: Cardiovascular: No filling defects within the pulmonary arteries to suggest acute pulmonary embolism. Trace pericardial effusion along the LEFT ventricle. Mediastinum/Nodes: No axillary or supraclavicular adenopathy. No mediastinal or hilar adenopathy. No pericardial fluid. Esophagus normal. Lungs/Pleura: No pulmonary infarction. No pneumonia. No suspicious pulmonary nodules. No pneumothorax or pleural fluid.  Upper Abdomen: Limited view of the liver, kidneys, pancreas are unremarkable. Normal adrenal glands. Musculoskeletal: No aggressive osseous lesion. Review of the MIP images confirms the above findings. IMPRESSION: 1. No acute pulmonary embolism. 2. Trace pericardial effusion. 3. No acute pulmonary parenchymal findings. Electronically Signed   By: Suzy Bouchard M.D.   On: 02/23/2021 18:51   CT ABDOMEN PELVIS W CONTRAST  Result Date: 02/23/2021 CLINICAL DATA:  Abdominal distension, bowel obstruction suspected, abdominal pain for 4 days, last bowel movement reportedly 2 weeks ago EXAM: CT ABDOMEN AND PELVIS WITH CONTRAST TECHNIQUE: Multidetector CT imaging of the abdomen and pelvis was performed using the standard protocol following bolus administration of intravenous contrast. CONTRAST:  174mL OMNIPAQUE IOHEXOL 350 MG/ML SOLN COMPARISON:  None. FINDINGS: Lower chest: Lung bases are clear. Normal cardiac size. No pericardial effusion. Coronary artery atherosclerosis. Hepatobiliary: No worrisome focal liver lesions. Smooth liver surface contour. Normal hepatic attenuation. Gallbladder and biliary tree. Pancreas: No pancreatic ductal dilatation or surrounding inflammatory changes. Spleen: Normal in size. No concerning splenic lesions. Adrenals/Urinary Tract: Adrenal glands. Kidneys symmetrically and uniformly. Multiple fluid attenuation cysts present throughout the left kidney. No concerning renal masses. No urolithiasis or hydronephrosis. Urinary bladder is unremarkable for the degree of distention. Stomach/Bowel: Small sliding-type hiatal hernia. Distal stomach and duodenum are unremarkable. No small bowel thickening or dilatation. No evidence of high-grade obstruction. No colonic dilatation or wall thickening. Only mild-to-moderate colonic stool burden is noted. Question some mild rectal wall thickening with some adjacent presacral stranding, nonspecific. Normal appendix in the right quadrant.  Vascular/Lymphatic: Postsurgical changes the left inguinal region compatible with history of recent femoropopliteal bypass performed 02/13/2021. Occlusion of what appears to be the native superficial femoral artery is again noted though this is incompletely assessed within the field of view and given a non angiographic technique. No large hematoma, soft tissue gas or abscess is seen. Atherosclerotic plaque throughout the abdominal aorta and branch vessels elsewhere in  the abdomen. No aneurysm or ectasia. Reproductive: Slightly retroverted uterus with multiple calcified fibroids. No concerning adnexal masses or lesions Other: Perirectal/presacral fat stranding. No free fluid or air. Edematous postsurgical changes the left groin, as described above. Abdominopelvic free air or fluid. No bowel containing hernia. Musculoskeletal: Multilevel degenerative changes are present in the imaged portions of the spine. IMPRESSION: 1. No high-grade bowel obstruction or evidence of ileus. Only mild-to-moderate colonic stool burden is seen. 2. Question some mild rectal wall thickening versus underdistention with presacral stranding. Could correlate for features of proctitis. 3. Postsurgical changes in the left groin from recent femoropopliteal bypass, occlusion the native superficial femoral arteries partially visualized, incompletely assessed on this exam given field of view and non angiographic technique. No gross complication is seen within margins of imaging. 4. Retroverted, fibroid uterus. 5. Coronary artery atherosclerosis. 6.  Aortic Atherosclerosis (ICD10-I70.0). Electronically Signed   By: Lovena Le M.D.   On: 02/23/2021 18:56    Procedures Procedures   Medications Ordered in ED Medications  sodium chloride 0.9 % bolus 1,000 mL (0 mLs Intravenous Stopped 02/23/21 1935)  iohexol (OMNIPAQUE) 350 MG/ML injection 100 mL (100 mLs Intravenous Contrast Given 02/23/21 1828)    ED Course  I have reviewed the triage vital  signs and the nursing notes.  Pertinent labs & imaging results that were available during my care of the patient were reviewed by me and considered in my medical decision making (see chart for details).    MDM Rules/Calculators/A&P                         Latreshia A Rhines is a 70 y.o. female who presented with constipation and some shortness of breath.  Patient apparently was not having a bowel movement for 2 weeks. Patient has slightly a distended abdomen.  We will get CT to rule out a SBO.  Patient also has subjective shortness of breath as well and she is already on Xarelto.  Given her recent surgery we will get a CTA to rule out PE as well.  10:06 PM CT showed constipation and no PE.  Patient's labs were unremarkable.  I think at this point she can be discharged home with some MiraLAX. Upon discharge, patient states that she is very unhappy with her facility does not want to go back.  We tried to reach family but family is unable to take care of her at home.  I told her that she can follow-up with social work at the facility if she wants to transfer to a different facility.  I told her that she cannot stay in the ER currently and doesn't meet criteria for admission to the hospital     Final Clinical Impression(s) / ED Diagnoses Final diagnoses:  None    Rx / DC Orders ED Discharge Orders     None        Drenda Freeze, MD 02/23/21 2207

## 2021-02-23 NOTE — ED Notes (Signed)
Pt transported back to room from CT via stretcher.  

## 2021-02-23 NOTE — ED Notes (Signed)
Pt was given crackers and water at the bedside.

## 2021-02-24 NOTE — ED Notes (Signed)
Provided pt with beverage.

## 2021-02-27 NOTE — Progress Notes (Signed)
POST OPERATIVE OFFICE NOTE    CC:  F/u for surgery  HPI:  This is a 70 y.o. female who is s/p left CFA to below knee popliteal artery bypass with ipsilateral GSV and left 5th toe amputation including metatarsal head on 02/13/2021 by Dr. Trula Slade.  She had hx of gangrenous changes to the left 5th toe.    Angiography revealed occlusion of the superficial femoral artery at its origin, with reconstitution of the popliteal artery.    She was seen in the ER on 7/2 for abdominal pain/constipation and SOB.  CT of abdomen revealed no evidence of ileus or bowel obstruction.  She did have mild to moderate stool burden.  She was discharged with miralax.  She also had CTA to rule out PE and there was no findings of acute PE.  She has been on Xarelto.   Also in the ER, she had a DP pulse and motor was in tact.  Her dressing on her foot was not removed at that time.   Pt returns today for follow up and accompanied by her transportation person.  Pt states her foot does not hurt.  Her dressing has not been changed since 6/28.    Allergies  Allergen Reactions   Cymbalta [Duloxetine Hcl] Other (See Comments)    Sweating, bad dreams,    Current Outpatient Medications  Medication Sig Dispense Refill   acetaminophen (TYLENOL) 325 MG tablet Take 2 tablets (650 mg total) by mouth every 6 (six) hours as needed for mild pain (or Fever >/= 101).     amLODipine (NORVASC) 10 MG tablet Take 1 tablet (10 mg total) by mouth daily. 30 tablet 0   aspirin EC 81 MG EC tablet Take 1 tablet (81 mg total) by mouth daily. Swallow whole. 30 tablet 11   atorvastatin (LIPITOR) 40 MG tablet Take 1 tablet (40 mg total) by mouth daily. 30 tablet 0   losartan (COZAAR) 50 MG tablet Take 1 tablet (50 mg total) by mouth daily. 30 tablet 0   pantoprazole (PROTONIX) 40 MG tablet Take 1 tablet (40 mg total) by mouth daily. 90 tablet 1   polyethylene glycol (MIRALAX / GLYCOLAX) 17 g packet Take 17 g by mouth daily. 14 each 0   rivaroxaban  (XARELTO) 10 MG TABS tablet Take 1 tablet (10 mg total) by mouth daily. Please continue taking as long as you are immobile. 30 tablet 0   varenicline (CHANTIX) 0.5 MG tablet Take 1 tablet (0.5 mg total) by mouth 2 (two) times daily. 30 tablet 0   No current facility-administered medications for this visit.     ROS:  See HPI  Physical Exam:  Today's Vitals   03/04/21 0848  BP: (!) 148/68  Pulse: 78  Resp: 20  Temp: 97.7 F (36.5 C)  SpO2: 98%  Weight: 225 lb (102.1 kg)  Height: 5\' 2"  (1.575 m)   Body mass index is 41.15 kg/m.   Incision:  left groin and left thigh incisions healing nicely.    Left 5th toe amp site   Extremities:  brisk doppler signal left DP; some moisture between toes.     Assessment/Plan:  This is a 70 y.o. female who is s/p: left CFA to below knee popliteal artery bypass with ipsilateral GSV and left 5th toe amputation including metatarsal head on 02/13/2021 by Dr. Trula Slade.  -brisk left DP doppler signal.   -left groin and proximal thigh incisions are healing nicely.  Her left 5th toe amp site dressing had  not been changed since 02/19/2021.   Copious amounts of saline used to removed gauze from wound and pt tolerated well.  It has good granulation tissue.  Orders written for facility to do wet to dry dressing daily with 2x2, 4x4 and kerlix as well as keeping dry gauze between toes daily.   -continue asa/statin -she was on Xarelto at discharge for DVT prophylaxis as she had been very immobile and felt she could discontinue this once she was more mobile and able to consistently ambulate.  -will have her f/u in 2-3 weeks for wound check.  Will need to have ABI and arterial duplex in 6 weeks (will order at next visit).   Leontine Locket, [facility] [phone]   Clinic MD:  Carlis Abbott on call MD

## 2021-03-04 ENCOUNTER — Other Ambulatory Visit: Payer: Self-pay

## 2021-03-04 ENCOUNTER — Ambulatory Visit (INDEPENDENT_AMBULATORY_CARE_PROVIDER_SITE_OTHER): Payer: Medicare (Managed Care) | Admitting: Physician Assistant

## 2021-03-04 VITALS — BP 148/68 | HR 78 | Temp 97.7°F | Resp 20 | Ht 62.0 in | Wt 225.0 lb

## 2021-03-04 DIAGNOSIS — Z95828 Presence of other vascular implants and grafts: Secondary | ICD-10-CM

## 2021-03-04 DIAGNOSIS — I739 Peripheral vascular disease, unspecified: Secondary | ICD-10-CM

## 2021-03-25 ENCOUNTER — Ambulatory Visit (INDEPENDENT_AMBULATORY_CARE_PROVIDER_SITE_OTHER): Payer: Medicare (Managed Care) | Admitting: Physician Assistant

## 2021-03-25 ENCOUNTER — Other Ambulatory Visit: Payer: Self-pay

## 2021-03-25 VITALS — BP 134/81 | HR 86 | Temp 97.2°F | Resp 16 | Ht 62.0 in | Wt 220.0 lb

## 2021-03-25 DIAGNOSIS — I739 Peripheral vascular disease, unspecified: Secondary | ICD-10-CM

## 2021-03-25 NOTE — Progress Notes (Signed)
  POST OPERATIVE OFFICE NOTE    CC:  F/u for surgery  HPI:  This is a 70 y.o. female who is s/p left 5th toe amputation. She returns today for wound check. No complaints. Resides at [facility]. Using wheelchair only.  She is s/p left CFA to below knee popliteal artery bypass with ipsilateral GSV and left 5th toe amputation including metatarsal head on 02/13/2021 by Dr. Trula Slade.  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     amLODipine (NORVASC) 10 MG tablet Take 1 tablet (10 mg total) by mouth daily. 30 tablet 0   aspirin EC 81 MG EC tablet Take 1 tablet (81 mg total) by mouth daily. Swallow whole. 30 tablet 11   atorvastatin (LIPITOR) 40 MG tablet Take 1 tablet (40 mg total) by mouth daily. 30 tablet 0   losartan (COZAAR) 50 MG tablet Take 1 tablet (50 mg total) by mouth daily. 30 tablet 0   pantoprazole (PROTONIX) 40 MG tablet Take 1 tablet (40 mg total) by mouth daily. 90 tablet 1   polyethylene glycol (MIRALAX / GLYCOLAX) 17 g packet Take 17 g by mouth daily. 14 each 0   rivaroxaban (XARELTO) 10 MG TABS tablet Take 1 tablet (10 mg total) by mouth daily. Please continue taking as long as you are immobile. 30 tablet 0   varenicline (CHANTIX) 0.5 MG tablet Take 1 tablet (0.5 mg total) by mouth 2 (two) times daily. 30 tablet 0   No current facility-administered medications for this visit.     ROS:  See HPI  Vitals:   03/25/21 0919  BP: 134/81  Pulse: 86  Resp: 16  Temp: (!) 97.2 F (36.2 C)  SpO2: 100%     Physical Exam: General appearance: Awake, alert in no apparent distress Cardiac: Heart rate and rhythm are regular Respirations:  Nonlabored Extremities: Left 5th toe amputation site is well healed. Left groin and lower leg incisions also well healed. 2+ bilateral DP pulses   Assessment/Plan:  This is a 70 y.o. female who is s/p: left 5th toe amputation approximately 6 weeks post-op. (Underwent left fem-pop bypass also). No further wound care needed. OK to bear weight on LLE.  Follow-up in one month with ABIs and LLE arterial duplex  Risa Grill, PA-C [facility] [phone]  Clinic MD:  Trula Slade

## 2021-03-27 ENCOUNTER — Other Ambulatory Visit: Payer: Self-pay

## 2021-03-27 DIAGNOSIS — I739 Peripheral vascular disease, unspecified: Secondary | ICD-10-CM

## 2021-04-07 ENCOUNTER — Emergency Department (HOSPITAL_COMMUNITY)
Admission: EM | Admit: 2021-04-07 | Discharge: 2021-04-08 | Disposition: A | Discharge status: Home / Self Care | Payer: Medicare (Managed Care) | Attending: Emergency Medicine | Admitting: Emergency Medicine

## 2021-04-07 ENCOUNTER — Other Ambulatory Visit: Payer: Self-pay

## 2021-04-07 ENCOUNTER — Emergency Department (HOSPITAL_COMMUNITY): Payer: Medicare (Managed Care)

## 2021-04-07 DIAGNOSIS — F17210 Nicotine dependence, cigarettes, uncomplicated: Secondary | ICD-10-CM | POA: Diagnosis not present

## 2021-04-07 DIAGNOSIS — R110 Nausea: Secondary | ICD-10-CM | POA: Insufficient documentation

## 2021-04-07 DIAGNOSIS — R0602 Shortness of breath: Secondary | ICD-10-CM | POA: Insufficient documentation

## 2021-04-07 DIAGNOSIS — Z7901 Long term (current) use of anticoagulants: Secondary | ICD-10-CM | POA: Insufficient documentation

## 2021-04-07 DIAGNOSIS — Z7982 Long term (current) use of aspirin: Secondary | ICD-10-CM | POA: Diagnosis not present

## 2021-04-07 DIAGNOSIS — Z96652 Presence of left artificial knee joint: Secondary | ICD-10-CM | POA: Insufficient documentation

## 2021-04-07 DIAGNOSIS — E1169 Type 2 diabetes mellitus with other specified complication: Secondary | ICD-10-CM | POA: Diagnosis not present

## 2021-04-07 DIAGNOSIS — I10 Essential (primary) hypertension: Secondary | ICD-10-CM | POA: Insufficient documentation

## 2021-04-07 DIAGNOSIS — E1151 Type 2 diabetes mellitus with diabetic peripheral angiopathy without gangrene: Secondary | ICD-10-CM | POA: Diagnosis not present

## 2021-04-07 DIAGNOSIS — R072 Precordial pain: Secondary | ICD-10-CM | POA: Insufficient documentation

## 2021-04-07 DIAGNOSIS — Z79899 Other long term (current) drug therapy: Secondary | ICD-10-CM | POA: Insufficient documentation

## 2021-04-07 DIAGNOSIS — R079 Chest pain, unspecified: Secondary | ICD-10-CM

## 2021-04-07 LAB — BASIC METABOLIC PANEL
Anion gap: 10 (ref 5–15)
BUN: 24 mg/dL — ABNORMAL HIGH (ref 8–23)
CO2: 24 mmol/L (ref 22–32)
Calcium: 8.8 mg/dL — ABNORMAL LOW (ref 8.9–10.3)
Chloride: 105 mmol/L (ref 98–111)
Creatinine, Ser: 1.09 mg/dL — ABNORMAL HIGH (ref 0.44–1.00)
GFR, Estimated: 55 mL/min — ABNORMAL LOW (ref >60)
Glucose, Bld: 133 mg/dL — ABNORMAL HIGH (ref 70–99)
Potassium: 3.8 mmol/L (ref 3.5–5.1)
Sodium: 139 mmol/L (ref 135–145)

## 2021-04-07 LAB — CBC
HCT: 35.9 % — ABNORMAL LOW (ref 36.0–46.0)
Hemoglobin: 10.8 g/dL — ABNORMAL LOW (ref 12.0–15.0)
MCH: 27.1 pg (ref 26.0–34.0)
MCHC: 30.1 g/dL (ref 30.0–36.0)
MCV: 90 fL (ref 80.0–100.0)
Platelets: 284 10*3/uL (ref 150–400)
RBC: 3.99 MIL/uL (ref 3.87–5.11)
RDW: 14.7 % (ref 11.5–15.5)
WBC: 6.5 10*3/uL (ref 4.0–10.5)
nRBC: 0 % (ref 0.0–0.2)

## 2021-04-07 LAB — TROPONIN I (HIGH SENSITIVITY): Troponin I (High Sensitivity): 8 ng/L (ref <18)

## 2021-04-07 NOTE — ED Triage Notes (Signed)
Pt from [facility], CP "all day." 324 ASA PTA, 2NTG en route, no relief w pain. Hx smoker, "prone to pneumonia." Lung sounds clear.   20LAC VSS

## 2021-04-08 ENCOUNTER — Other Ambulatory Visit: Payer: Self-pay

## 2021-04-08 ENCOUNTER — Emergency Department (HOSPITAL_COMMUNITY): Payer: Medicare (Managed Care)

## 2021-04-08 DIAGNOSIS — R072 Precordial pain: Secondary | ICD-10-CM | POA: Diagnosis not present

## 2021-04-08 LAB — D-DIMER, QUANTITATIVE: D-Dimer, Quant: 2.47 ug/mL-FEU — ABNORMAL HIGH (ref 0.00–0.50)

## 2021-04-08 LAB — TROPONIN I (HIGH SENSITIVITY): Troponin I (High Sensitivity): 7 ng/L (ref <18)

## 2021-04-08 MED ORDER — ACETAMINOPHEN 325 MG PO TABS
650.0000 mg | ORAL_TABLET | Freq: Once | ORAL | Status: AC
  Administered 2021-04-08: 650 mg via ORAL
  Filled 2021-04-08: qty 2

## 2021-04-08 MED ORDER — IOHEXOL 350 MG/ML SOLN
65.0000 mL | Freq: Once | INTRAVENOUS | Status: AC | PRN
  Administered 2021-04-08: 65 mL via INTRAVENOUS

## 2021-04-08 NOTE — ED Notes (Signed)
CT notified of new IV, pt ready for CTA

## 2021-04-08 NOTE — ED Notes (Signed)
PT brought back to [facility]. Pt d/c from ED but is also supposed to be d/c from [facility] today too. Waiting on ok from facility to d/c home from ED with daughter

## 2021-04-08 NOTE — ED Notes (Signed)
Spoke with daughter Lyndle Herrlich and confirms she is coming to pick the pt up

## 2021-04-08 NOTE — ED Provider Notes (Signed)
[facility] EMERGENCY DEPARTMENT Provider Note   CSN: [id number] Arrival date & time: 04/07/21  2003     History Chief Complaint  Patient presents with   Chest Pain    Tiffany [name] is a 70 y.o. female.  Patient is a 70 year old female with a past medical history of hypertension, recent left femoral-popliteal bypass presenting for chest pain and shortness of breath.  Patient states that yesterday morning she woke up at 6 AM with chest pain located in the middle of her chest that radiated towards her left shoulder.  She became short of breath and nauseous with the chest pain that started yesterday.  She complains of chest pain when she takes a deep breath.  She states that she is wheelchair-bound and does not ambulate.  Patient had surgery on 6/22 for left common femoral to below-knee popliteal artery bypass graft and left fifth toe amputation. There are no signs of redness or infection to her left leg and left toe. Patient states that she is on xarelto and has not missed a dose. She complains of bilateral leg pain.  Patient denies any fevers, chills, neck stiffness, headache, vomiting, diarrhea, numbness or weakness.   Chest Pain Pain location:  Substernal area Pain quality: pressure   Pain radiates to:  Does not radiate Duration:  1 day Context: breathing   Relieved by:  None tried Worsened by:  Deep breathing Associated symptoms: nausea and shortness of breath   Associated symptoms: no abdominal pain, no AICD problem, no altered mental status, no anorexia, no anxiety, no back pain, no claudication, no cough, no diaphoresis, no dizziness, no dysphagia, no fatigue, no fever, no headache, no heartburn, no lower extremity edema, no near-syncope, no numbness, no orthopnea, no palpitations, no PND, no syncope, no vomiting and no weakness       Past Medical History:  Diagnosis Date   Arthritis    osteoarthritis   GERD (gastroesophageal reflux disease)    Hx of  bronchitis    Hypertension     Patient Active Problem List   Diagnosis Date Noted   S/P femoral-popliteal bypass surgery 02/13/2021   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 ([place]) 02/13/2021   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 ([place]) 02/06/2021   Gangrene ([place])    Diabetes mellitus with peripheral vascular disease ([place]) 09/13/2015   Generalized anxiety disorder 06/15/2014   Chronic pain 12/28/2013   GERD (gastroesophageal reflux disease) 09/01/2013   Tobacco abuse 09/01/2013   Hypertension    Expected blood loss anemia 06/01/2013   Morbid obesity ([place]) 06/01/2013   S/P left TKA 05/31/2013   Preoperative cardiovascular examination 02/18/2013    Past Surgical History:  Procedure Laterality Date   ABDOMINAL AORTOGRAM W/LOWER EXTREMITY N/A 02/08/2021   Procedure: ABDOMINAL AORTOGRAM W/LOWER EXTREMITY;  Surgeon: Elam Dutch, MD;  Location: [facility];  Service: Cardiovascular;  Laterality: N/A;   AMPUTATION Left 02/13/2021   Procedure: FIFTH TOE AMPUTATION;  Surgeon: [name], MD;  Location: [facility] OR;  Service: Vascular;  Laterality: Left;   FEMORAL-POPLITEAL BYPASS GRAFT Left 02/13/2021   Procedure: BYPASS GRAFT LEFT FEMORAL-POPLITEAL ARTERY;  Surgeon: [name], MD;  Location: [facility];  Service: Vascular;  Laterality: Left;   KNEE SURGERY     arthroscopic right   toe nail removal     TOTAL KNEE ARTHROPLASTY Left 05/31/2013   Procedure: LEFT TOTAL KNEE ARTHROPLASTY;  Surgeon: Mauri Pole, MD;  Location: [facility];  Service:  Orthopedics;  Laterality: Left;   VEIN HARVEST Left 02/13/2021   Procedure: VEIN HARVEST LEFT GREATER SAPHENOUS VEIN;  Surgeon: [name], MD;  Location: [facility];  Service: Vascular;  Laterality: Left;     OB History   No obstetric history on file.     Family History  Problem Relation Age of Onset   Heart disease Father        Died of CHF age 46   Heart disease Mother        Died of CHF age 18   Cancer Brother        Cancer    Heart failure Brother        Died of CHF    High blood pressure Son    Colon cancer Neg Hx     Social History   Tobacco Use   Smoking status: Every Day    Packs/day: 0.25    Years: 20.00    Pack years: 5.00    Types: Cigarettes   Smokeless tobacco: Never   Tobacco comments:    5 cig daily   Substance Use Topics   Alcohol use: No    Alcohol/week: 0.0 standard drinks   Drug use: No    Home Medications Prior to Admission medications   Medication Sig Start Date End Date Taking? Authorizing Provider  acetaminophen (TYLENOL) 325 MG tablet Take 2 tablets (650 mg total) by mouth every 6 (six) hours as needed for mild pain (or Fever >/= 101). 02/19/21   Katsadouros, Vasilios, MD  amLODipine (NORVASC) 10 MG tablet Take 1 tablet (10 mg total) by mouth daily. 02/19/21   Riesa Pope, MD  aspirin EC 81 MG EC tablet Take 1 tablet (81 mg total) by mouth daily. Swallow whole. 02/19/21   Riesa Pope, MD  atorvastatin (LIPITOR) 40 MG tablet Take 1 tablet (40 mg total) by mouth daily. 02/19/21   Riesa Pope, MD  losartan (COZAAR) 50 MG tablet Take 1 tablet (50 mg total) by mouth daily. 02/19/21   Katsadouros, Vasilios, MD  pantoprazole (PROTONIX) 40 MG tablet Take 1 tablet (40 mg total) by mouth daily. 03/06/16   Lauree Chandler, NP  polyethylene glycol (MIRALAX / GLYCOLAX) 17 g packet Take 17 g by mouth daily. 02/23/21   Drenda Freeze, MD  rivaroxaban (XARELTO) 10 MG TABS tablet Take 1 tablet (10 mg total) by mouth daily. Please continue taking as long as you are immobile. 02/19/21 03/21/21  Riesa Pope, MD  varenicline (CHANTIX) 0.5 MG tablet Take 1 tablet (0.5 mg total) by mouth 2 (two) times daily. Patient not taking: Reported on 03/25/2021 02/19/21   Riesa Pope, MD    Allergies    Cymbalta [duloxetine hcl]  Review of Systems   Review of Systems  Constitutional:  Negative for chills, diaphoresis, fatigue and fever.  HENT:  Negative for  congestion, dental problem, ear discharge, ear pain, facial swelling, hearing loss, nosebleeds, postnasal drip, rhinorrhea, sinus pain, sneezing, sore throat and trouble swallowing.   Eyes:  Negative for pain and visual disturbance.  Respiratory:  Positive for shortness of breath. Negative for cough, chest tightness, wheezing and stridor.   Cardiovascular:  Positive for chest pain. Negative for palpitations, orthopnea, claudication, leg swelling, syncope, PND and near-syncope.  Gastrointestinal:  Positive for nausea. Negative for abdominal distention, abdominal pain, anorexia, blood in stool, constipation, diarrhea, heartburn and vomiting.  Endocrine: Negative for polydipsia and polyuria.  Genitourinary:  Negative for difficulty urinating, dysuria, flank pain, frequency, hematuria, urgency, vaginal  bleeding and vaginal discharge.  Musculoskeletal:  Negative for back pain, myalgias, neck pain and neck stiffness.  Skin:  Negative for rash and wound.  Allergic/Immunologic: Negative for environmental allergies and food allergies.  Neurological:  Negative for dizziness, seizures, syncope, facial asymmetry, speech difficulty, weakness, light-headedness, numbness and headaches.  Psychiatric/Behavioral:  Negative for agitation, behavioral problems and confusion.    Physical Exam Updated Vital Signs BP 121/78   Pulse 70   Temp 98 F (36.7 C) (Oral)   Resp 14   Ht '5\' 2"'$  (1.575 m)   Wt 104.3 kg   SpO2 98%   BMI 42.07 kg/m   Physical Exam Vitals and nursing note reviewed.  Constitutional:      General: She is not in acute distress.    Appearance: Normal appearance. She is normal weight. She is not ill-appearing.  HENT:     Head: Normocephalic and atraumatic.     Right Ear: External ear normal.     Left Ear: External ear normal.     Nose: Nose normal. No congestion.     Mouth/Throat:     Mouth: Mucous membranes are moist.     Pharynx: Oropharynx is clear. No oropharyngeal exudate or  posterior oropharyngeal erythema.  Eyes:     General: No visual field deficit.    Extraocular Movements: Extraocular movements intact.     Conjunctiva/sclera: Conjunctivae normal.     Pupils: Pupils are equal, round, and reactive to light.  Neck:     Vascular: No carotid bruit.  Cardiovascular:     Rate and Rhythm: Normal rate and regular rhythm.     Pulses: Normal pulses.     Heart sounds: Normal heart sounds. No murmur heard. Pulmonary:     Effort: Pulmonary effort is normal. No respiratory distress.     Breath sounds: Normal breath sounds. No stridor. No wheezing, rhonchi or rales.  Chest:     Chest wall: No tenderness.  Abdominal:     General: Bowel sounds are normal. There is no distension.     Palpations: Abdomen is soft.     Tenderness: There is no abdominal tenderness. There is no right CVA tenderness, left CVA tenderness, guarding or rebound.  Musculoskeletal:        General: No swelling or tenderness. Normal range of motion.     Cervical back: Normal range of motion and neck supple. No rigidity, tenderness or bony tenderness.     Thoracic back: Normal. No tenderness or bony tenderness.     Lumbar back: Normal. No tenderness or bony tenderness.     Right lower leg: No edema.     Left lower leg: No edema.  Feet:     Comments: Left fifth toe amputation Skin:    General: Skin is warm and dry.     Coloration: Skin is not jaundiced.     Comments: Healing surgical scar on left leg with no signs of infection. No signs of cellulitis or abscess  Neurological:     General: No focal deficit present.     Mental Status: She is alert and oriented to person, place, and time. Mental status is at baseline.     Cranial Nerves: Cranial nerves are intact. No cranial nerve deficit, dysarthria or facial asymmetry.     Sensory: Sensation is intact. No sensory deficit.     Motor: Motor function is intact. No weakness.     Coordination: Coordination is intact. Finger-Nose-Finger Test normal.      Gait: Gait is  intact. Gait normal.  Psychiatric:        Mood and Affect: Mood normal.        Behavior: Behavior normal.        Thought Content: Thought content normal.        Judgment: Judgment normal.    ED Results / Procedures / Treatments   Labs (all labs ordered are listed, but only abnormal results are displayed) Labs Reviewed  BASIC METABOLIC PANEL - Abnormal; Notable for the following components:      Result Value   Glucose, Bld 133 (*)    BUN 24 (*)    Creatinine, Ser 1.09 (*)    Calcium 8.8 (*)    GFR, Estimated 55 (*)    All other components within normal limits  CBC - Abnormal; Notable for the following components:   Hemoglobin 10.8 (*)    HCT 35.9 (*)    All other components within normal limits  D-DIMER, QUANTITATIVE - Abnormal; Notable for the following components:   D-Dimer, Quant 2.47 (*)    All other components within normal limits  TROPONIN I (HIGH SENSITIVITY)  TROPONIN I (HIGH SENSITIVITY)    EKG EKG Interpretation  Date/Time:  Sunday April 07 2021 20:03:29 EDT Ventricular Rate:  80 PR Interval:  92 QRS Duration: 76 QT Interval:  414 QTC Calculation: 477 R Axis:   17 Text Interpretation: Sinus rhythm with short PR Nonspecific ST abnormality Abnormal ECG axis normal no acute ischemia Confirmed by Lorre Munroe ([id number]) on 04/08/2021 9:34:21 AM  Radiology DG Chest 2 View  Result Date: 04/07/2021 CLINICAL DATA:  Chest pain EXAM: CHEST - 2 VIEW COMPARISON:  11/22/2020 FINDINGS: Cardiac shadow is mildly enlarged but stable. Aortic calcifications are noted. The lungs are well aerated bilaterally. No focal infiltrate or effusion is seen. Degenerative changes of the thoracic spine are noted. IMPRESSION: No active cardiopulmonary disease. Electronically Signed   By: Inez Catalina M.D.   On: 04/07/2021 20:53    Procedures Procedures   Medications Ordered in ED Medications  acetaminophen (TYLENOL) tablet 650 mg (650 mg Oral Given 04/08/21 1047)    ED  Course  I have reviewed the triage vital signs and the nursing notes.  Pertinent labs & imaging results that were available during my care of the patient were reviewed by me and considered in my medical decision making (see chart for details).    MDM Rules/Calculators/A&P                         Tiffany [name] is a 70 y.o. female with a past medical history of hypertension, recent left femoral-popliteal bypass presenting for chest pain and shortness of breath. Patient describes the chest pain as pleuritic chest pain. She was experiencing shortness of breath earlier but is not experiencing shortness of breath. On physical exam patient has no crackles or wheezing on lung auscultation. She has bilateral leg pain. Her left fifth toe amputation is healing without complication. The left leg is warmer than the right leg touch to touch but no signs of cellulitis or abscess. Bilateral legs are neurovascularly intact with strong pulses. EKG showed no signs of acute ischemia. Her troponin was negative x2. Her CBC was notable for a hemoglobin of 10.8 that is consistent with prior CBCs. Her CXR showed no acute cardiac or pulmonary abnormality.  At this time do not think that patient's chest pain is secondary to ACS, dissection, pericardial effusion/tamponade, pericarditis.  Patient's pleuritic chest pain,  being nonambulatory, and hx of recent procedure 2 months ago concerning for PE. Her d-dimer was elevated. CTA of the chest ordered to rule out PE.   Patient was signed out to Dr. Florene Glen at [phone], please refer to his note for the rest of the patient's clinical course while in the ED.  The plan for this patient was discussed with Dr. Maryan Rued, who voiced agreement and who oversaw evaluation and treatment of this patient.   Final Clinical Impression(s) / ED Diagnoses Final diagnoses:  None    Rx / DC Orders ED Discharge Orders     None        Doretha Sou, MD 04/08/21 [id number]    Blanchie Dessert,  MD 04/09/21 1429

## 2021-04-08 NOTE — ED Notes (Signed)
[facility] here to take pt to facility. Attempted to notify daughter, no answer and unable to leave VM.

## 2021-04-08 NOTE — ED Notes (Signed)
OK to d/c pt today from ED per facility staff. Tiffany and Rashima

## 2021-04-08 NOTE — Discharge Instructions (Addendum)
The CT scan of your chest did not show any evidence of a blood clot.  Please follow-up with your primary care doctor.  Please do not hesitate to return to the emergency department if your symptoms significantly change or worsen.

## 2021-04-08 NOTE — ED Notes (Addendum)
PT given a tuna fish sandwich as requested. PTAR requested for pt after multiple calls to daughter with no answer

## 2021-04-08 NOTE — ED Notes (Signed)
PTAR CALLED PT IS 14 ON THE LIST

## 2021-04-08 NOTE — ED Provider Notes (Signed)
70 year old with hypertension and diabetes on Eliquis presenting with atypical chest pain.  EKG reassuring, delta troponin negative.  Elevated D-dimer, so previous team ordered CTA PE study.  We will follow-up on the results of this CTA.   Vitals:   04/08/21 1425 04/08/21 1445  BP: 119/63 121/78  Pulse: 71 70  Resp: 12 14  Temp:    SpO2: 98% 98%   CTA PE study negative.  Do not believe that she is experiencing an emergent cause of her symptoms.  I believe that she is stable for discharge back to her facility.  Patient is comfortable the plan.  Strict return precautions provided and verbal and written format.   Claud Kelp, MD 04/08/21 1704    Luna Fuse, MD 04/11/21 [phone]

## 2021-04-08 NOTE — ED Notes (Signed)
All 3 numbers were called in pt chart in attempt to get pt a ride home. Daughter Lyndle Herrlich called and there was also no answer.

## 2021-04-14 ENCOUNTER — Emergency Department (HOSPITAL_COMMUNITY): Payer: Medicare (Managed Care)

## 2021-04-14 ENCOUNTER — Encounter (HOSPITAL_COMMUNITY): Payer: Self-pay | Admitting: Oncology

## 2021-04-14 ENCOUNTER — Emergency Department (HOSPITAL_COMMUNITY)
Admission: EM | Admit: 2021-04-14 | Discharge: 2021-04-14 | Disposition: A | Discharge status: Home / Self Care | Payer: Medicare (Managed Care) | Attending: Emergency Medicine | Admitting: Emergency Medicine

## 2021-04-14 ENCOUNTER — Other Ambulatory Visit: Payer: Self-pay

## 2021-04-14 DIAGNOSIS — E119 Type 2 diabetes mellitus without complications: Secondary | ICD-10-CM | POA: Insufficient documentation

## 2021-04-14 DIAGNOSIS — Z28310 Unvaccinated for covid-19: Secondary | ICD-10-CM | POA: Diagnosis not present

## 2021-04-14 DIAGNOSIS — Z20822 Contact with and (suspected) exposure to covid-19: Secondary | ICD-10-CM | POA: Insufficient documentation

## 2021-04-14 DIAGNOSIS — M791 Myalgia, unspecified site: Secondary | ICD-10-CM

## 2021-04-14 DIAGNOSIS — Z7982 Long term (current) use of aspirin: Secondary | ICD-10-CM | POA: Diagnosis not present

## 2021-04-14 DIAGNOSIS — Z96652 Presence of left artificial knee joint: Secondary | ICD-10-CM | POA: Diagnosis not present

## 2021-04-14 DIAGNOSIS — I10 Essential (primary) hypertension: Secondary | ICD-10-CM | POA: Diagnosis not present

## 2021-04-14 DIAGNOSIS — F17210 Nicotine dependence, cigarettes, uncomplicated: Secondary | ICD-10-CM | POA: Insufficient documentation

## 2021-04-14 DIAGNOSIS — Z79899 Other long term (current) drug therapy: Secondary | ICD-10-CM | POA: Insufficient documentation

## 2021-04-14 LAB — CBC WITH DIFFERENTIAL/PLATELET
Abs Immature Granulocytes: 0.02 10*3/uL (ref 0.00–0.07)
Basophils Absolute: 0 10*3/uL (ref 0.0–0.1)
Basophils Relative: 1 %
Eosinophils Absolute: 0.1 10*3/uL (ref 0.0–0.5)
Eosinophils Relative: 2 %
HCT: 36.5 % (ref 36.0–46.0)
Hemoglobin: 11.1 g/dL — ABNORMAL LOW (ref 12.0–15.0)
Immature Granulocytes: 0 %
Lymphocytes Relative: 23 %
Lymphs Abs: 1.4 10*3/uL (ref 0.7–4.0)
MCH: 27.4 pg (ref 26.0–34.0)
MCHC: 30.4 g/dL (ref 30.0–36.0)
MCV: 90.1 fL (ref 80.0–100.0)
Monocytes Absolute: 0.5 10*3/uL (ref 0.1–1.0)
Monocytes Relative: 7 %
Neutro Abs: 4.2 10*3/uL (ref 1.7–7.7)
Neutrophils Relative %: 67 %
Platelets: 232 10*3/uL (ref 150–400)
RBC: 4.05 MIL/uL (ref 3.87–5.11)
RDW: 14.5 % (ref 11.5–15.5)
WBC: 6.3 10*3/uL (ref 4.0–10.5)
nRBC: 0 % (ref 0.0–0.2)

## 2021-04-14 LAB — BASIC METABOLIC PANEL
Anion gap: 12 (ref 5–15)
BUN: 19 mg/dL (ref 8–23)
CO2: 23 mmol/L (ref 22–32)
Calcium: 9 mg/dL (ref 8.9–10.3)
Chloride: 105 mmol/L (ref 98–111)
Creatinine, Ser: 0.8 mg/dL (ref 0.44–1.00)
GFR, Estimated: >60 mL/min (ref >60)
Glucose, Bld: 94 mg/dL (ref 70–99)
Potassium: 3.8 mmol/L (ref 3.5–5.1)
Sodium: 140 mmol/L (ref 135–145)

## 2021-04-14 LAB — RESP PANEL BY RT-PCR (FLU A&B, COVID) ARPGX2
Influenza A by PCR: NEGATIVE
Influenza B by PCR: NEGATIVE
SARS Coronavirus 2 by RT PCR: NEGATIVE

## 2021-04-14 LAB — CK: Total CK: 108 U/L (ref 38–234)

## 2021-04-14 MED ORDER — ACETAMINOPHEN 325 MG PO TABS: 650.0000 mg | ORAL_TABLET | Freq: Four times a day (QID) | ORAL | 0 refills | Status: DC | PRN

## 2021-04-14 MED ORDER — ACETAMINOPHEN 500 MG PO TABS
1000.0000 mg | ORAL_TABLET | Freq: Once | ORAL | Status: AC
  Administered 2021-04-14: 1000 mg via ORAL
  Filled 2021-04-14: qty 2

## 2021-04-14 NOTE — ED Provider Notes (Signed)
[facility] DEPT Provider Note   CSN: [id number] Arrival date & time: 04/14/21  1423     History Chief Complaint  Patient presents with   Generalized Body Aches    Tiffany [name] is a 70 y.o. female.  The history is provided by the patient and medical records. No language interpreter was used.   70 year old female significant history of GERD, hypertension, diabetes, obesity who presents complaining of body aches.  Patient reports she woke up this morning with generalized body aches.  States that this affected her entire body, feels very achy, rates pain as 10 out of 10, persistent.  No associated fever headache neck pain Raynaud's sneezing coughing sore throat chest pain shortness of breath abdominal pain back pain dysuria or rash.  No recent sick contact.  No recent medication changes.  She has not been vaccinated for COVID-19.  She denies any specific treatment tried.  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[place]) 02/13/2021   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 ([place]) 02/06/2021   Gangrene ([place])    Diabetes mellitus with peripheral vascular disease ([place]) 09/13/2015   Generalized anxiety disorder 06/15/2014   Chronic pain 12/28/2013   GERD (gastroesophageal reflux disease) 09/01/2013   Tobacco abuse 09/01/2013   Hypertension    Expected blood loss anemia 06/01/2013   Morbid obesity ([place]) 06/01/2013   S/P left TKA 05/31/2013   Preoperative cardiovascular examination 02/18/2013    Past Surgical History:  Procedure Laterality Date   ABDOMINAL AORTOGRAM W/LOWER EXTREMITY N/A 02/08/2021   Procedure: ABDOMINAL AORTOGRAM W/LOWER EXTREMITY;  Surgeon: Elam Dutch, MD;  Location: [facility];  Service: Cardiovascular;  Laterality: N/A;   AMPUTATION Left 02/13/2021   Procedure: FIFTH TOE AMPUTATION;  Surgeon: [name], MD;  Location: [facility] OR;  Service: Vascular;  Laterality: Left;   FEMORAL-POPLITEAL BYPASS GRAFT Left 02/13/2021   Procedure: BYPASS GRAFT LEFT FEMORAL-POPLITEAL ARTERY;  Surgeon: [name], MD;  Location: [facility];  Service: Vascular;  Laterality: Left;   KNEE SURGERY     arthroscopic right   toe nail removal     TOTAL KNEE ARTHROPLASTY Left 05/31/2013   Procedure: LEFT TOTAL KNEE ARTHROPLASTY;  Surgeon: Mauri Pole, MD;  Location: [facility];  Service: Orthopedics;  Laterality: Left;   VEIN HARVEST Left 02/13/2021   Procedure: VEIN HARVEST LEFT GREATER SAPHENOUS VEIN;  Surgeon: [name], MD;  Location: [facility] OR;  Service: Vascular;  Laterality: Left;     OB History   No obstetric history on file.     Family History  Problem Relation Age of Onset   Heart disease Father        Died of CHF age 61   Heart disease Mother        Died of CHF age 35   Cancer Brother        Cancer   Heart failure Brother        Died of CHF    High blood pressure Son    Colon cancer Neg Hx     Social History   Tobacco Use   Smoking status: Every Day    Packs/day: 0.25  Years: 20.00    Pack years: 5.00    Types: Cigarettes   Smokeless tobacco: Never   Tobacco comments:    5 cig daily   Substance Use Topics   Alcohol use: No    Alcohol/week: 0.0 standard drinks   Drug use: No    Home Medications Prior to Admission medications   Medication Sig Start Date End Date Taking? Authorizing Provider  acetaminophen (TYLENOL) 325 MG tablet Take 2 tablets (650 mg total) by mouth every 6 (six) hours as needed for mild pain (or Fever >/= 101). 02/19/21   Katsadouros, Vasilios, MD  amLODipine (NORVASC) 10 MG tablet Take 1 tablet (10 mg total) by mouth daily. 02/19/21   Riesa Pope, MD  aspirin EC 81 MG EC tablet Take 1 tablet (81 mg total) by mouth  daily. Swallow whole. 02/19/21   Riesa Pope, MD  atorvastatin (LIPITOR) 40 MG tablet Take 1 tablet (40 mg total) by mouth daily. 02/19/21   Riesa Pope, MD  losartan (COZAAR) 50 MG tablet Take 1 tablet (50 mg total) by mouth daily. 02/19/21   Katsadouros, Vasilios, MD  pantoprazole (PROTONIX) 40 MG tablet Take 1 tablet (40 mg total) by mouth daily. 03/06/16   Lauree Chandler, NP  polyethylene glycol (MIRALAX / GLYCOLAX) 17 g packet Take 17 g by mouth daily. 02/23/21   Drenda Freeze, MD  rivaroxaban (XARELTO) 10 MG TABS tablet Take 1 tablet (10 mg total) by mouth daily. Please continue taking as long as you are immobile. 02/19/21 03/21/21  Riesa Pope, MD  varenicline (CHANTIX) 0.5 MG tablet Take 1 tablet (0.5 mg total) by mouth 2 (two) times daily. Patient not taking: Reported on 03/25/2021 02/19/21   Riesa Pope, MD    Allergies    Cymbalta [duloxetine hcl]  Review of Systems   Review of Systems  All other systems reviewed and are negative.  Physical Exam Updated Vital Signs BP (!) 159/94   Pulse 61   Temp 98.6 F (37 C) (Oral)   Resp 18   SpO2 100%   Physical Exam Vitals and nursing note reviewed.  Constitutional:      General: She is not in acute distress.    Appearance: She is well-developed. She is obese.  HENT:     Head: Atraumatic.     Mouth/Throat:     Mouth: Mucous membranes are moist.  Eyes:     Conjunctiva/sclera: Conjunctivae normal.  Cardiovascular:     Rate and Rhythm: Normal rate and regular rhythm.     Pulses: Normal pulses.     Heart sounds: Normal heart sounds.  Pulmonary:     Effort: Pulmonary effort is normal.     Breath sounds: No wheezing, rhonchi or rales.  Abdominal:     Palpations: Abdomen is soft.     Tenderness: There is no abdominal tenderness.  Musculoskeletal:     Cervical back: Neck supple.  Skin:    Findings: No rash.  Neurological:     Mental Status: She is alert. Mental status is at baseline.   Psychiatric:        Mood and Affect: Mood normal.    ED Results / Procedures / Treatments   Labs (all labs ordered are listed, but only abnormal results are displayed) Labs Reviewed  CBC WITH DIFFERENTIAL/PLATELET - Abnormal; Notable for the following components:      Result Value   Hemoglobin 11.1 (*)    All other components within normal limits  RESP PANEL BY RT-PCR (FLU  A&B, COVID) ARPGX2  BASIC METABOLIC PANEL  CK  URINALYSIS, ROUTINE W REFLEX MICROSCOPIC    EKG None  Radiology DG Chest 2 View  Result Date: 04/14/2021 CLINICAL DATA:  Myalgia EXAM: CHEST - 2 VIEW COMPARISON:  04/07/2021 FINDINGS: Heart is mildly enlarged. Lungs clear. No effusions or edema. No acute bony abnormality. IMPRESSION: Mild cardiomegaly.  No active disease. Electronically Signed   By: Rolm Baptise M.D.   On: 04/14/2021 15:37    Procedures Procedures   Medications Ordered in ED Medications  acetaminophen (TYLENOL) tablet 1,000 mg (1,000 mg Oral Given 04/14/21 1739)    ED Course  I have reviewed the triage vital signs and the nursing notes.  Pertinent labs & imaging results that were available during my care of the patient were reviewed by me and considered in my medical decision making (see chart for details).    MDM Rules/Calculators/A&P                           BP (!) 159/94   Pulse 61   Temp 98.6 F (37 C) (Oral)   Resp 18   SpO2 100%   Final Clinical Impression(s) / ED Diagnoses Final diagnoses:  Myalgia    Rx / DC Orders ED Discharge Orders          Ordered    acetaminophen (TYLENOL) 325 MG tablet  Every 6 hours PRN        04/14/21 1838           5:28 PM Patient here with myalgias.  Symptoms started today.  She has not been vaccinated for COVID-19.  She is without any other symptoms.  She is overall well-appearing, lungs clear on auscultation, abdomen soft nontender on exam.  Will obtain COVID test, since patient is on Lipitor, will check CK to assess for  potential rhabdomyolysis.  Will check UA as well.  Patient is currently on Xarelto.  She was placed on Xarelto back in June of this year due to immobility.  From prior notes that have reviewed, patient can be discontinued this medication if she is ambulating.  6:33 PM Fortunately labs are reassuring, normal CK doubt rhabdomyolysis causing myalgia.  COVID test negative.  Normal electrolytes, chest x-ray unremarkable.  At this time, patient stable for discharge.  Care discussed with Dr. Eulis Foster.   Domenic Moras, PA-C 04/14/21 1840    Daleen Bo, MD 04/14/21 2240

## 2021-04-14 NOTE — ED Triage Notes (Signed)
Pt bib [facility] from home d/t generalized body aches. Vague c/o to EMS. EMS had to ask pt to put her cigarette out so they could evaluate her.

## 2021-04-14 NOTE — Discharge Instructions (Addendum)
You have been evaluated for muscle aches.  At this time your COVID test is negative.  However, your symptoms may be early signs of COVID infection.  If you develop fever cough congestion in the next few days it is likely COVID infection.  Use resource below for further care.  Return if you have any concern.

## 2021-04-14 NOTE — ED Provider Notes (Signed)
Emergency Medicine Provider Triage Evaluation Note  DULCIE TRUBEY , a 70 y.o. female  was evaluated in triage.  Pt complains of generalized achiness.  She states she is hungry.  She has not had any fever, chills, vomiting, shortness of breath, cough, weakness or dizziness.  She has not had COVID-vaccine.  She presents by EMS.  She is a tobacco smoker.  Review of Systems  Positive: Myalgia Negative: No cough  Physical Exam  Pulse 60   Temp 98.6 F (37 C) (Oral)   Resp 16   SpO2 99%  Gen:   Awake, no distress   Resp:  Normal effort.  Lungs with good air movement bilaterally.  No stridor. MSK:   Moves extremities without difficulty  Other:  Obese, sitting on wheelchair nontoxic appearance  Medical Decision Making  Medically screening exam initiated at 3:08 PM.  Appropriate orders placed.  Kennisha A Fitterer was informed that the remainder of the evaluation will be completed by another provider, this initial triage assessment does not replace that evaluation, and the importance of remaining in the ED until their evaluation is complete.     Daleen Bo, MD 04/14/21 [phone]

## 2021-04-14 NOTE — ED Provider Notes (Signed)
  Face-to-face evaluation   History: She is here for evaluation of general achiness, without fever, cough, rhinorrhea.  She has not had COVID-vaccine.  She smokes cigarettes.  She is unable to give more specific history.  She presents by EMS.  Physical exam: Elderly, obese alert and cooperative.  No dysarthria or aphasia.  No respiratory distress.  Lungs without rhonchi or wheezes.  There is no increased work of breathing.  Medical screening examination/treatment/procedure(s) were conducted as a shared visit with non-physician practitioner(s) and myself.  I personally evaluated the patient during the encounter    Daleen Bo, MD 04/14/21 2240

## 2021-05-06 ENCOUNTER — Encounter (HOSPITAL_COMMUNITY): Payer: Medicare (Managed Care)

## 2021-05-06 ENCOUNTER — Ambulatory Visit: Payer: Medicare (Managed Care)

## 2021-05-15 ENCOUNTER — Ambulatory Visit: Payer: Medicare (Managed Care) | Admitting: Family Medicine

## 2021-07-17 ENCOUNTER — Emergency Department (HOSPITAL_COMMUNITY): Payer: Medicare (Managed Care)

## 2021-07-17 ENCOUNTER — Emergency Department (HOSPITAL_COMMUNITY)
Admission: EM | Admit: 2021-07-17 | Discharge: 2021-07-17 | Disposition: A | Discharge status: Home / Self Care | Payer: Medicare (Managed Care) | Attending: Emergency Medicine | Admitting: Emergency Medicine

## 2021-07-17 ENCOUNTER — Encounter (HOSPITAL_COMMUNITY): Payer: Self-pay

## 2021-07-17 ENCOUNTER — Other Ambulatory Visit: Payer: Self-pay

## 2021-07-17 DIAGNOSIS — K769 Liver disease, unspecified: Secondary | ICD-10-CM | POA: Diagnosis not present

## 2021-07-17 DIAGNOSIS — F0390 Unspecified dementia without behavioral disturbance: Secondary | ICD-10-CM | POA: Diagnosis not present

## 2021-07-17 DIAGNOSIS — Z96652 Presence of left artificial knee joint: Secondary | ICD-10-CM | POA: Insufficient documentation

## 2021-07-17 DIAGNOSIS — Z7982 Long term (current) use of aspirin: Secondary | ICD-10-CM | POA: Insufficient documentation

## 2021-07-17 DIAGNOSIS — Z79899 Other long term (current) drug therapy: Secondary | ICD-10-CM | POA: Diagnosis not present

## 2021-07-17 DIAGNOSIS — I10 Essential (primary) hypertension: Secondary | ICD-10-CM | POA: Insufficient documentation

## 2021-07-17 DIAGNOSIS — R109 Unspecified abdominal pain: Secondary | ICD-10-CM | POA: Diagnosis present

## 2021-07-17 DIAGNOSIS — F17210 Nicotine dependence, cigarettes, uncomplicated: Secondary | ICD-10-CM | POA: Insufficient documentation

## 2021-07-17 DIAGNOSIS — Z20822 Contact with and (suspected) exposure to covid-19: Secondary | ICD-10-CM | POA: Diagnosis not present

## 2021-07-17 DIAGNOSIS — R079 Chest pain, unspecified: Secondary | ICD-10-CM | POA: Insufficient documentation

## 2021-07-17 DIAGNOSIS — E1151 Type 2 diabetes mellitus with diabetic peripheral angiopathy without gangrene: Secondary | ICD-10-CM | POA: Diagnosis not present

## 2021-07-17 LAB — CBC WITH DIFFERENTIAL/PLATELET
Abs Immature Granulocytes: 0.03 10*3/uL (ref 0.00–0.07)
Basophils Absolute: 0 10*3/uL (ref 0.0–0.1)
Basophils Relative: 0 %
Eosinophils Absolute: 0 10*3/uL (ref 0.0–0.5)
Eosinophils Relative: 0 %
HCT: 40 % (ref 36.0–46.0)
Hemoglobin: 12.3 g/dL (ref 12.0–15.0)
Immature Granulocytes: 0 %
Lymphocytes Relative: 12 %
Lymphs Abs: 1 10*3/uL (ref 0.7–4.0)
MCH: 26.3 pg (ref 26.0–34.0)
MCHC: 30.8 g/dL (ref 30.0–36.0)
MCV: 85.5 fL (ref 80.0–100.0)
Monocytes Absolute: 0.6 10*3/uL (ref 0.1–1.0)
Monocytes Relative: 6 %
Neutro Abs: 7.3 10*3/uL (ref 1.7–7.7)
Neutrophils Relative %: 82 %
Platelets: 290 10*3/uL (ref 150–400)
RBC: 4.68 MIL/uL (ref 3.87–5.11)
RDW: 14.6 % (ref 11.5–15.5)
WBC: 8.9 10*3/uL (ref 4.0–10.5)
nRBC: 0 % (ref 0.0–0.2)

## 2021-07-17 LAB — URINALYSIS, ROUTINE W REFLEX MICROSCOPIC
Bilirubin Urine: NEGATIVE
Glucose, UA: NEGATIVE mg/dL
Ketones, ur: NEGATIVE mg/dL
Leukocytes,Ua: NEGATIVE
Nitrite: NEGATIVE
Protein, ur: NEGATIVE mg/dL
Specific Gravity, Urine: >1.046 — ABNORMAL HIGH (ref 1.005–1.030)
pH: 5 (ref 5.0–8.0)

## 2021-07-17 LAB — COMPREHENSIVE METABOLIC PANEL
ALT: 20 U/L (ref 0–44)
AST: 33 U/L (ref 15–41)
Albumin: 2.7 g/dL — ABNORMAL LOW (ref 3.5–5.0)
Alkaline Phosphatase: 63 U/L (ref 38–126)
Anion gap: 9 (ref 5–15)
BUN: 28 mg/dL — ABNORMAL HIGH (ref 8–23)
CO2: 23 mmol/L (ref 22–32)
Calcium: 6.9 mg/dL — ABNORMAL LOW (ref 8.9–10.3)
Chloride: 109 mmol/L (ref 98–111)
Creatinine, Ser: 0.87 mg/dL (ref 0.44–1.00)
GFR, Estimated: >60 mL/min (ref >60)
Glucose, Bld: 143 mg/dL — ABNORMAL HIGH (ref 70–99)
Potassium: 3.7 mmol/L (ref 3.5–5.1)
Sodium: 141 mmol/L (ref 135–145)
Total Bilirubin: 1.4 mg/dL — ABNORMAL HIGH (ref 0.3–1.2)
Total Protein: 6.6 g/dL (ref 6.5–8.1)

## 2021-07-17 LAB — AMMONIA: Ammonia: <10 umol/L (ref 9–35)

## 2021-07-17 LAB — CK: Total CK: 310 U/L — ABNORMAL HIGH (ref 38–234)

## 2021-07-17 LAB — LIPASE, BLOOD: Lipase: 28 U/L (ref 11–51)

## 2021-07-17 LAB — RESP PANEL BY RT-PCR (FLU A&B, COVID) ARPGX2
Influenza A by PCR: NEGATIVE
Influenza B by PCR: NEGATIVE
SARS Coronavirus 2 by RT PCR: NEGATIVE

## 2021-07-17 LAB — TROPONIN I (HIGH SENSITIVITY)
Troponin I (High Sensitivity): 8 ng/L (ref <18)
Troponin I (High Sensitivity): 17 ng/L (ref <18)

## 2021-07-17 LAB — LACTIC ACID, PLASMA: Lactic Acid, Venous: 2.1 mmol/L (ref 0.5–1.9)

## 2021-07-17 LAB — CBG MONITORING, ED: Glucose-Capillary: 106 mg/dL — ABNORMAL HIGH (ref 70–99)

## 2021-07-17 MED ORDER — LACTATED RINGERS IV BOLUS
500.0000 mL | Freq: Once | INTRAVENOUS | Status: AC
  Administered 2021-07-17: 500 mL via INTRAVENOUS

## 2021-07-17 MED ORDER — IOHEXOL 350 MG/ML SOLN
100.0000 mL | Freq: Once | INTRAVENOUS | Status: AC | PRN
  Administered 2021-07-17: 80 mL via INTRAVENOUS

## 2021-07-17 MED ORDER — ACETAMINOPHEN 500 MG PO TABS
1000.0000 mg | ORAL_TABLET | Freq: Once | ORAL | Status: AC
  Administered 2021-07-17: 1000 mg via ORAL
  Filled 2021-07-17: qty 2

## 2021-07-17 NOTE — ED Triage Notes (Signed)
Pt BIB EMS from home. Family reports pt has dementia, pt is more altered today, not able to walk today. Pt able to walk at baseline. Pt son lives with her. Pt has hx of HTN, diabetes. Pt was incontinent today. Pt c/o abdominal pain. Pt had a fall today, denies hitting her head.  150/100 96 HR CBG 120  20G L AC 95% RA

## 2021-07-17 NOTE — Progress Notes (Signed)
Consulted to place PIV. Patient in MRI.

## 2021-07-17 NOTE — Discharge Instructions (Signed)
Your CT scan shows an abnormal lesion in your liver.  You will need an outpatient ultrasound to further evaluate this.  Your primary care physician can set this up.  If you develop recurrent, continued, or worsening chest pain, shortness of breath, fever, vomiting, abdominal or back pain, or any other new/concerning symptoms then return to the ER for evaluation.

## 2021-07-17 NOTE — ED Provider Notes (Signed)
[facility] DEPT Provider Note   CSN: [id number] Arrival date & time: 07/17/21  1442  LEVEL 5 CAVEAT - DEMENTIA   History Chief Complaint  Patient presents with   Altered Mental Status    Tiffany [name] is a 70 y.o. female.  HPI 70 year old female presents primarily with abdominal pain. Patient has dementia and thus history is primarily from the son. She has a chronic history of dementia.  No new or worsening mental status recently.  Today she was getting off the couch fell back onto the couch and then slid onto the floor.  She is now complaining of abdominal pain.  Over the last few weeks she is not been walking on her own, complaining of her knees hurting and she is having to use a walker and rely on her son.  He does note that she has been eating less over the last couple days.  He had to call EMS today because she was crying when he was try to get her off the floor.,  Vomiting, or any other new concerning symptoms to the son.   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[place]) 02/13/2021   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 ([place]) 02/06/2021   Gangrene ([place])    Diabetes mellitus with peripheral vascular disease ([place]) 09/13/2015   Generalized anxiety disorder 06/15/2014   Chronic pain 12/28/2013   GERD (gastroesophageal reflux disease) 09/01/2013   Tobacco abuse 09/01/2013   Hypertension    Expected blood loss anemia 06/01/2013   Morbid obesity ([place]) 06/01/2013   S/P left TKA 05/31/2013   Preoperative cardiovascular examination 02/18/2013    Past Surgical History:  Procedure Laterality Date   ABDOMINAL AORTOGRAM W/LOWER EXTREMITY N/A 02/08/2021   Procedure: ABDOMINAL AORTOGRAM W/LOWER  EXTREMITY;  Surgeon: Elam Dutch, MD;  Location: [facility];  Service: Cardiovascular;  Laterality: N/A;   AMPUTATION Left 02/13/2021   Procedure: FIFTH TOE AMPUTATION;  Surgeon: [name], MD;  Location: [facility] OR;  Service: Vascular;  Laterality: Left;   FEMORAL-POPLITEAL BYPASS GRAFT Left 02/13/2021   Procedure: BYPASS GRAFT LEFT FEMORAL-POPLITEAL ARTERY;  Surgeon: [name], MD;  Location: [facility];  Service: Vascular;  Laterality: Left;   KNEE SURGERY     arthroscopic right   toe nail removal     TOTAL KNEE ARTHROPLASTY Left 05/31/2013   Procedure: LEFT TOTAL KNEE ARTHROPLASTY;  Surgeon: Mauri Pole, MD;  Location: [facility];  Service: Orthopedics;  Laterality: Left;   VEIN HARVEST Left 02/13/2021   Procedure: VEIN HARVEST LEFT GREATER SAPHENOUS VEIN;  Surgeon: [name], MD;  Location: [facility] OR;  Service: Vascular;  Laterality: Left;     OB History   No obstetric history on file.     Family History  Problem Relation Age of Onset   Heart disease Father        Died of CHF age 83   Heart disease Mother        Died of CHF age 21   Cancer Brother        Cancer   Heart failure Brother        Died of CHF  High blood pressure Son    Colon cancer Neg Hx     Social History   Tobacco Use   Smoking status: Every Day    Packs/day: 0.25    Years: 20.00    Pack years: 5.00    Types: Cigarettes   Smokeless tobacco: Never   Tobacco comments:    5 cig daily   Substance Use Topics   Alcohol use: No    Alcohol/week: 0.0 standard drinks   Drug use: No    Home Medications Prior to Admission medications   Medication Sig Start Date End Date Taking? Authorizing Provider  acetaminophen (TYLENOL) 325 MG tablet Take 2 tablets (650 mg total) by mouth every 6 (six) hours as needed for mild pain or moderate pain (or Fever >/= 101). 04/14/21   Domenic Moras, PA-C  amLODipine (NORVASC) 10 MG tablet Take 1 tablet (10 mg total) by mouth daily. 02/19/21   Riesa Pope, MD  aspirin EC 81 MG EC tablet Take 1 tablet (81 mg total) by mouth daily. Swallow whole. 02/19/21   Riesa Pope, MD  atorvastatin (LIPITOR) 40 MG tablet Take 1 tablet (40 mg total) by mouth daily. 02/19/21   Riesa Pope, MD  losartan (COZAAR) 50 MG tablet Take 1 tablet (50 mg total) by mouth daily. 02/19/21   Katsadouros, Vasilios, MD  pantoprazole (PROTONIX) 40 MG tablet Take 1 tablet (40 mg total) by mouth daily. 03/06/16   Lauree Chandler, NP  polyethylene glycol (MIRALAX / GLYCOLAX) 17 g packet Take 17 g by mouth daily. 02/23/21   Drenda Freeze, MD  rivaroxaban (XARELTO) 10 MG TABS tablet Take 1 tablet (10 mg total) by mouth daily. Please continue taking as long as you are immobile. 02/19/21 03/21/21  Riesa Pope, MD  varenicline (CHANTIX) 0.5 MG tablet Take 1 tablet (0.5 mg total) by mouth 2 (two) times daily. Patient not taking: Reported on 03/25/2021 02/19/21   Riesa Pope, MD    Allergies    Cymbalta [duloxetine hcl]  Review of Systems   Review of Systems  Unable to perform ROS: Dementia   Physical Exam Updated Vital Signs BP (!) 179/93   Pulse 95   Temp 100.1 F (37.8 C) (Rectal)   Resp 15   SpO2 95%   Physical Exam Vitals and nursing note reviewed.  Constitutional:      Appearance: She is well-developed. She is obese.  HENT:     Head: Normocephalic and atraumatic.     Right Ear: External ear normal.     Left Ear: External ear normal.     Nose: Nose normal.  Eyes:     General:        Right eye: No discharge.        Left eye: No discharge.  Cardiovascular:     Rate and Rhythm: Normal rate and regular rhythm.     Heart sounds: Normal heart sounds.  Pulmonary:     Effort: Pulmonary effort is normal.     Breath sounds: Normal breath sounds.  Abdominal:     Palpations: Abdomen is soft.     Tenderness: There is abdominal tenderness (right upper and lower abdomen).  Skin:    General: Skin is warm and dry.   Neurological:     Mental Status: She is alert.     Comments: Patient is awake, alert, oriented to place, but disoriented to time. She has normal and equal strength in BUE. However, she cannot lift legs off bed. Is able to  keep right leg off stretcher when I lift it for about 1 second, but cannot hold left leg up against gravity at all.  Psychiatric:        Mood and Affect: Mood is not anxious.    ED Results / Procedures / Treatments   Labs (all labs ordered are listed, but only abnormal results are displayed) Labs Reviewed  COMPREHENSIVE METABOLIC PANEL - Abnormal; Notable for the following components:      Result Value   Glucose, Bld 143 (*)    BUN 28 (*)    Calcium 6.9 (*)    Albumin 2.7 (*)    Total Bilirubin 1.4 (*)    All other components within normal limits  URINALYSIS, ROUTINE W REFLEX MICROSCOPIC - Abnormal; Notable for the following components:   APPearance HAZY (*)    Specific Gravity, Urine >1.046 (*)    Hgb urine dipstick SMALL (*)    Bacteria, UA RARE (*)    All other components within normal limits  LACTIC ACID, PLASMA - Abnormal; Notable for the following components:   Lactic Acid, Venous 2.1 (*)    All other components within normal limits  CK - Abnormal; Notable for the following components:   Total CK 310 (*)    All other components within normal limits  CBG MONITORING, ED - Abnormal; Notable for the following components:   Glucose-Capillary 106 (*)    All other components within normal limits  RESP PANEL BY RT-PCR (FLU A&B, COVID) ARPGX2  CULTURE, BLOOD (ROUTINE X 2)  CULTURE, BLOOD (ROUTINE X 2)  CBC WITH DIFFERENTIAL/PLATELET  AMMONIA  LIPASE, BLOOD  TROPONIN I (HIGH SENSITIVITY)  TROPONIN I (HIGH SENSITIVITY)    EKG EKG Interpretation  Date/Time:  Wednesday July 17 2021 15:26:18 EST Ventricular Rate:  90 PR Interval:  128 QRS Duration: 217 QT Interval:  436 QTC Calculation: 534 R Axis:   10 Text Interpretation: Sinus rhythm Left  bundle branch block nonspecific T waves Confirmed by Sherwood Gambler [phone]) on 07/17/2021 4:08:09 PM  Radiology CT HEAD WO CONTRAST  Addendum Date: 07/17/2021   ADDENDUM REPORT: 07/17/2021 17:02 ADDENDUM: Additional findings of note. In addition to worsening of chronic microvascular ischemic changes there is hypodensity in the LEFT thalamus, (image 15/1). This may reflect chronic infarct but is technically age indeterminate. Further imaging with MRI could be considered as warranted for further assessment. These results were called by telephone at the time of interpretation on 07/17/2021 at 5:02 pm to provider Dr. Kathrynn Humble, Who verbally acknowledged these results. Electronically Signed   By: Zetta Bills M.D.   On: 07/17/2021 17:02   Result Date: 07/17/2021 CLINICAL DATA:  A 70 year old female presents with mental status changes of unknown cause. EXAM: CT HEAD WITHOUT CONTRAST TECHNIQUE: Contiguous axial images were obtained from the base of the skull through the vertex without intravenous contrast. COMPARISON:  March 07, 2014. FINDINGS: Brain: No evidence of acute infarction, hemorrhage, hydrocephalus, extra-axial collection or mass lesion/mass effect. Signs of atrophy and chronic microvascular ischemic change with worsening of chronic microvascular ischemic changes since previous imaging. Vascular: No hyperdense vessel or unexpected calcification. Skull: Normal. Negative for fracture or focal lesion. Sinuses/Orbits: Visualized paranasal sinuses and orbits are unremarkable. Other: None IMPRESSION: No acute intracranial abnormality. Signs of atrophy and chronic microvascular ischemic change with worsening of chronic microvascular ischemic changes since previous imaging. Electronically Signed: By: Zetta Bills M.D. On: 07/17/2021 16:21   MR BRAIN WO CONTRAST  Result Date: 07/17/2021 CLINICAL DATA:  Neuro deficit,  acute, stroke suspected. Altered mental status. EXAM: MRI HEAD WITHOUT CONTRAST  TECHNIQUE: Multiplanar, multiecho pulse sequences of the brain and surrounding structures were obtained without intravenous contrast. COMPARISON:  Head CT 07/17/2021 FINDINGS: The study is mildly motion degraded. Brain: No acute infarct, mass, midline shift, or extra-axial fluid collection is identified. Confluent T2 hyperintensities in the cerebral white matter and patchy T2 hyperintensities in the pons are nonspecific but compatible with severe chronic small vessel ischemic disease. Chronic lacunar infarcts are noted in the deep cerebral white matter and deep gray nuclei bilaterally as well as in the cerebellum. A chronic lacunar infarct is noted in the splenium of the corpus callosum on the right with some associated shine through on diffusion-weighted imaging. There are numerous chronic microhemorrhages in both cerebral hemispheres, particularly in the deep gray nuclei, as well as in the pons and cerebellum suggesting chronic hypertension. There is moderate generalized cerebral atrophy. Vascular: Major intracranial vascular flow voids are preserved. Skull and upper cervical spine: Unremarkable bone marrow signal. Sinuses/Orbits: Unremarkable orbits. Paranasal sinuses and mastoid air cells are clear. Other: None. IMPRESSION: 1. No acute intracranial abnormality. 2. Severe chronic small vessel ischemic disease. 3. Numerous chronic microhemorrhages suggesting chronic hypertension. Electronically Signed   By: Logan Bores M.D.   On: 07/17/2021 18:56   CT ABDOMEN PELVIS W CONTRAST  Addendum Date: 07/17/2021   ADDENDUM REPORT: 07/17/2021 17:03 ADDENDUM: These results were called by telephone at the time of interpretation on 07/17/2021 at 5:02 pm to provider Dr. Kathrynn Humble, Who verbally acknowledged these results. Electronically Signed   By: Zetta Bills M.D.   On: 07/17/2021 17:03   Result Date: 07/17/2021 CLINICAL DATA:  Nonlocalized abdominal pain in a 70 year old female. EXAM: CT ABDOMEN AND PELVIS WITH  CONTRAST TECHNIQUE: Multidetector CT imaging of the abdomen and pelvis was performed using the standard protocol following bolus administration of intravenous contrast. CONTRAST:  51mL OMNIPAQUE IOHEXOL 350 MG/ML SOLN COMPARISON:  February 23, 2021. FINDINGS: Lower chest: Incidental imaging of the lung bases without effusion or consolidation. Minimal atelectatic changes at the LEFT lung base. Hepatobiliary: 1.5 cm periportal hepatic lesion (image 15/3) unchanged in the short interval since July of 2022, more rounded than expected for focal fat. No pericholecystic stranding. No biliary duct dilation. Portal vein is patent. Pancreas: Normal, without mass, inflammation or ductal dilatation. Spleen: Spleen normal size and contour. Adrenals/Urinary Tract: Adrenal glands are normal. Symmetric renal enhancement without hydronephrosis. No suspicious renal lesion with low-density lesions in the bilateral kidneys which are unchanged largest arising from the lower pole the LEFT kidney. Urinary bladder with smooth contours without perivesical stranding. Stomach/Bowel: Small hiatal hernia. No sign of bowel obstruction or acute bowel process. The appendix is normal. Moderate stool in the colon. Vascular/Lymphatic: Aortic atherosclerosis. No sign of aneurysm. Smooth contour of the IVC. There is no gastrohepatic or hepatoduodenal ligament lymphadenopathy. No retroperitoneal or mesenteric lymphadenopathy. No pelvic sidewall lymphadenopathy. Atherosclerotic changes extend into visceral branches in the abdomen as on the previous study. Also extending into the iliac vessels. Chronically occluded LEFT superficial femoral artery and changes related to prior access in this area. Reproductive: No adnexal mass. Unremarkable CT appearance of reproductive structures. Other: No ascites.  No free intraperitoneal air. Musculoskeletal: No acute bone finding. No destructive bone process. Spinal degenerative changes. IMPRESSION: No acute findings in the  abdomen or pelvis. Rounded low-density lesion, 58 Hounsfield units so not compatible with a cyst in the central liver anterior to porta hepatis. This is indeterminate, potentially representing atypical fat  deposition. Could consider initial evaluation with abdominal sonogram on a nonemergent basis to further characterize and exclude the possibility of solid lesion in this location. Small hiatal hernia. Aortic Atherosclerosis (ICD10-I70.0). Electronically Signed: By: Zetta Bills M.D. On: 07/17/2021 16:37   DG Chest Port 1 View  Result Date: 07/17/2021 CLINICAL DATA:  Altered mental status.  History of dementia. EXAM: PORTABLE CHEST 1 VIEW COMPARISON:  Radiographs 04/14/2021 and 04/07/2021.  CT 04/08/2021. FINDINGS: 1515 hours. Stable cardiomegaly with aortic atherosclerosis and chronic vascular congestion. No overt pulmonary edema, confluent airspace opacity, pleural effusion or pneumothorax. There are degenerative changes in the spine and both shoulders. No acute osseous findings are evident. IMPRESSION: Stable cardiomegaly and chronic vascular congestion. No acute findings identified. Electronically Signed   By: Richardean Sale M.D.   On: 07/17/2021 15:23    Procedures Procedures   Medications Ordered in ED Medications  iohexol (OMNIPAQUE) 350 MG/ML injection 100 mL (80 mLs Intravenous Contrast Given 07/17/21 1554)  lactated ringers bolus 500 mL (0 mLs Intravenous Stopped 07/17/21 1719)  acetaminophen (TYLENOL) tablet 1,000 mg (1,000 mg Oral Given 07/17/21 1721)    ED Course  I have reviewed the triage vital signs and the nursing notes.  Pertinent labs & imaging results that were available during my care of the patient were reviewed by me and considered in my medical decision making (see chart for details).    MDM Rules/Calculators/A&P                           Large work-up was started in triage.  The work-up is fairly unremarkable besides a questionable thalamic infarct.  MRI was  obtained given the episode of having difficulty getting up.  This is negative.  She has a slight lactate of 2.1 but with no obvious infection, this does not seem infectious and is likely from some dehydration.  She was given some IV fluids.  I did discuss with the son about the liver results on the CT and need for outpatient ultrasound.  Overall, he feels like she is at her baseline and wants to take her home.  I discussed that while her troponins are technically negative x2, it did increase a little more than would be typically expected and discussed options with him.  This included observation versus third troponin.  He declines both of these and wants to just take her home.  I discussed that we could be missing an MI and he seems understand this.  However her ECG did not show any obvious ischemia.  We discussed he should return if any new or worsening symptoms arise.  Will discharge home with return precautions. Final Clinical Impression(s) / ED Diagnoses Final diagnoses:  Nonspecific chest pain  Liver lesion    Rx / DC Orders ED Discharge Orders     None        Sherwood Gambler, MD 07/17/21 2225

## 2021-07-17 NOTE — ED Provider Notes (Signed)
Emergency Medicine Provider Triage Evaluation Note  Tiffany [name] , a 70 y.o. female  was evaluated in triage.  Pt arrives via EMS for altered mental status and weakness.  Son has been living with patient for the past month.  Reports this morning she was much weaker than usual, is usually able to get up and get herself to the bathroom and more confused than usual.  She does have dementia at baseline but today has decreased mentation and was not able to get up at all.  Patient reports she is hurting all over and then reports pain throughout her abdomen.  Denies vomiting or diarrhea.  Denies chest pain.  Denies any known fevers at home.  Review of Systems  Positive: Weakness, confusion, body aches, abdominal pain Negative: Fevers, chest pain, shortness of breath, vomiting, diarrhea  Physical Exam  BP (!) 152/94   Pulse 89   Temp 98.2 F (36.8 C) (Oral)   Resp 20   SpO2 93%  Gen:   Awake, alert and oriented to person and place, but not time.  Ill-appearing, smells of urine Resp:  Normal effort Abd:  Generalized abdominal tenderness MSK:   Able to extremities but has generalized weakness, difficulty lifting both legs off the bed Other:    Medical Decision Making  Medically screening exam initiated at 2:51 PM.  Appropriate orders placed.  Danett A Guerin was informed that the remainder of the evaluation will be completed by another provider, this initial triage assessment does not replace that evaluation, and the importance of remaining in the ED until their evaluation is complete.  Patient is acutely altered, very weak, this with triage charge patient.   Jacqlyn Larsen, PA-C 07/17/21 [place], MD 07/18/21 [phone]

## 2021-07-22 ENCOUNTER — Emergency Department (HOSPITAL_COMMUNITY)
Admission: EM | Admit: 2021-07-22 | Discharge: 2021-07-22 | Disposition: A | Discharge status: Home / Self Care | Payer: Medicare (Managed Care) | Attending: Emergency Medicine | Admitting: Emergency Medicine

## 2021-07-22 ENCOUNTER — Encounter (HOSPITAL_COMMUNITY): Payer: Self-pay

## 2021-07-22 ENCOUNTER — Emergency Department (HOSPITAL_COMMUNITY): Payer: Medicare (Managed Care)

## 2021-07-22 ENCOUNTER — Other Ambulatory Visit: Payer: Self-pay

## 2021-07-22 DIAGNOSIS — R079 Chest pain, unspecified: Secondary | ICD-10-CM

## 2021-07-22 DIAGNOSIS — Z96652 Presence of left artificial knee joint: Secondary | ICD-10-CM | POA: Insufficient documentation

## 2021-07-22 DIAGNOSIS — E1151 Type 2 diabetes mellitus with diabetic peripheral angiopathy without gangrene: Secondary | ICD-10-CM | POA: Diagnosis not present

## 2021-07-22 DIAGNOSIS — E876 Hypokalemia: Secondary | ICD-10-CM | POA: Diagnosis not present

## 2021-07-22 DIAGNOSIS — I2693 Single subsegmental pulmonary embolism without acute cor pulmonale: Secondary | ICD-10-CM | POA: Insufficient documentation

## 2021-07-22 DIAGNOSIS — F17210 Nicotine dependence, cigarettes, uncomplicated: Secondary | ICD-10-CM | POA: Diagnosis not present

## 2021-07-22 DIAGNOSIS — I10 Essential (primary) hypertension: Secondary | ICD-10-CM | POA: Diagnosis not present

## 2021-07-22 DIAGNOSIS — Z79899 Other long term (current) drug therapy: Secondary | ICD-10-CM | POA: Diagnosis not present

## 2021-07-22 DIAGNOSIS — Z7901 Long term (current) use of anticoagulants: Secondary | ICD-10-CM | POA: Diagnosis not present

## 2021-07-22 DIAGNOSIS — Z7982 Long term (current) use of aspirin: Secondary | ICD-10-CM | POA: Diagnosis not present

## 2021-07-22 DIAGNOSIS — Z20822 Contact with and (suspected) exposure to covid-19: Secondary | ICD-10-CM | POA: Insufficient documentation

## 2021-07-22 LAB — CBC
HCT: 42.6 % (ref 36.0–46.0)
Hemoglobin: 12.8 g/dL (ref 12.0–15.0)
MCH: 25.7 pg — ABNORMAL LOW (ref 26.0–34.0)
MCHC: 30 g/dL (ref 30.0–36.0)
MCV: 85.5 fL (ref 80.0–100.0)
Platelets: 334 10*3/uL (ref 150–400)
RBC: 4.98 MIL/uL (ref 3.87–5.11)
RDW: 14.2 % (ref 11.5–15.5)
WBC: 7.7 10*3/uL (ref 4.0–10.5)
nRBC: 0 % (ref 0.0–0.2)

## 2021-07-22 LAB — BASIC METABOLIC PANEL
Anion gap: 11 (ref 5–15)
BUN: 25 mg/dL — ABNORMAL HIGH (ref 8–23)
CO2: 28 mmol/L (ref 22–32)
Calcium: 8.7 mg/dL — ABNORMAL LOW (ref 8.9–10.3)
Chloride: 99 mmol/L (ref 98–111)
Creatinine, Ser: 1.12 mg/dL — ABNORMAL HIGH (ref 0.44–1.00)
GFR, Estimated: 53 mL/min — ABNORMAL LOW (ref >60)
Glucose, Bld: 129 mg/dL — ABNORMAL HIGH (ref 70–99)
Potassium: 3.1 mmol/L — ABNORMAL LOW (ref 3.5–5.1)
Sodium: 138 mmol/L (ref 135–145)

## 2021-07-22 LAB — RESP PANEL BY RT-PCR (FLU A&B, COVID) ARPGX2
Influenza A by PCR: NEGATIVE
Influenza B by PCR: NEGATIVE
SARS Coronavirus 2 by RT PCR: NEGATIVE

## 2021-07-22 LAB — LIPASE, BLOOD: Lipase: 29 U/L (ref 11–51)

## 2021-07-22 LAB — TROPONIN I (HIGH SENSITIVITY)
Troponin I (High Sensitivity): 35 ng/L — ABNORMAL HIGH (ref <18)
Troponin I (High Sensitivity): 45 ng/L — ABNORMAL HIGH (ref <18)

## 2021-07-22 MED ORDER — SODIUM CHLORIDE 0.9 % IV BOLUS
500.0000 mL | Freq: Once | INTRAVENOUS | Status: AC
  Administered 2021-07-22: 21:00:00 500 mL via INTRAVENOUS

## 2021-07-22 MED ORDER — KETOROLAC TROMETHAMINE 30 MG/ML IJ SOLN
30.0000 mg | Freq: Once | INTRAMUSCULAR | Status: AC
  Administered 2021-07-22: 21:00:00 30 mg via INTRAVENOUS
  Filled 2021-07-22: qty 1

## 2021-07-22 MED ORDER — POTASSIUM CHLORIDE CRYS ER 20 MEQ PO TBCR
40.0000 meq | EXTENDED_RELEASE_TABLET | Freq: Once | ORAL | Status: AC
  Administered 2021-07-22: 21:00:00 40 meq via ORAL
  Filled 2021-07-22: qty 2

## 2021-07-22 MED ORDER — POTASSIUM CHLORIDE ER 10 MEQ PO TBCR: 10.0000 meq | EXTENDED_RELEASE_TABLET | Freq: Every day | ORAL | 0 refills | Status: DC

## 2021-07-22 MED ORDER — APIXABAN 5 MG PO TABS
10.0000 mg | ORAL_TABLET | Freq: Two times a day (BID) | ORAL | Status: DC
  Administered 2021-07-22: 23:00:00 10 mg via ORAL
  Filled 2021-07-22: qty 2

## 2021-07-22 MED ORDER — APIXABAN (ELIQUIS) VTE STARTER PACK (10MG AND 5MG): ORAL_TABLET | ORAL | 0 refills | Status: DC

## 2021-07-22 MED ORDER — IOHEXOL 350 MG/ML SOLN
100.0000 mL | Freq: Once | INTRAVENOUS | Status: AC | PRN
  Administered 2021-07-22: 21:00:00 100 mL via INTRAVENOUS

## 2021-07-22 NOTE — ED Provider Notes (Signed)
[facility] DEPT Provider Note   CSN: [id number] Arrival date & time: 07/22/21  1637     History Chief Complaint  Patient presents with   Chest Pain   Headache    Tiffany [name] is a 70 y.o. 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  She reports gradual onset of epigastric discomfort this morning.  She does not recall what she was doing when it started.  It has been waxing and waning.  Is worse with sitting upright and deep breaths.  She claims she has had chest pain in the past but this 1 feels different.  She reports she had a mild headache this morning which also resolved.  She denies vomiting or diarrhea.  Her chest pain is currently moderate intensity.  She has not taken any new medications for it.  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[place]) 02/13/2021   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 ([place]) 02/06/2021   Gangrene ([place])    Diabetes mellitus with peripheral vascular disease ([place]) 09/13/2015   Generalized anxiety disorder 06/15/2014   Chronic pain 12/28/2013   GERD (gastroesophageal reflux disease) 09/01/2013   Tobacco abuse 09/01/2013   Hypertension    Expected blood loss anemia 06/01/2013   Morbid obesity ([place]) 06/01/2013   S/P left TKA 05/31/2013   Preoperative cardiovascular examination 02/18/2013    Past Surgical History:  Procedure Laterality Date   ABDOMINAL AORTOGRAM W/LOWER EXTREMITY N/A 02/08/2021   Procedure: ABDOMINAL AORTOGRAM W/LOWER EXTREMITY;  Surgeon: Elam Dutch, MD;  Location: [facility];  Service:  Cardiovascular;  Laterality: N/A;   AMPUTATION Left 02/13/2021   Procedure: FIFTH TOE AMPUTATION;  Surgeon: [name], MD;  Location[facility];  Service: Vascular;  Laterality: Left;   FEMORAL-POPLITEAL BYPASS GRAFT Left 02/13/2021   Procedure: BYPASS GRAFT LEFT FEMORAL-POPLITEAL ARTERY;  Surgeon: [name], MD;  Location: [facility];  Service: Vascular;  Laterality: Left;   KNEE SURGERY     arthroscopic right   toe nail removal     TOTAL KNEE ARTHROPLASTY Left 05/31/2013   Procedure: LEFT TOTAL KNEE ARTHROPLASTY;  Surgeon: Mauri Pole, MD;  Location: [facility];  Service: Orthopedics;  Laterality: Left;   VEIN HARVEST Left 02/13/2021   Procedure: VEIN HARVEST LEFT GREATER SAPHENOUS VEIN;  Surgeon: [name], MD;  Location: [facility] OR;  Service: Vascular;  Laterality: Left;     OB History   No obstetric history on file.     Family History  Problem Relation Age of Onset   Heart disease Father        Died of CHF age 60   Heart disease Mother        Died of CHF age 70   Cancer Brother        Cancer   Heart failure Brother        Died of CHF    High blood pressure Son    Colon cancer Neg Hx     Social History   Tobacco Use  Smoking status: Every Day    Packs/day: 0.25    Years: 20.00    Pack years: 5.00    Types: Cigarettes   Smokeless tobacco: Never   Tobacco comments:    5 cig daily   Vaping Use   Vaping Use: Never used  Substance Use Topics   Alcohol use: No    Alcohol/week: 0.0 standard drinks   Drug use: No    Home Medications Prior to Admission medications   Medication Sig Start Date End Date Taking? Authorizing Provider  acetaminophen (TYLENOL) 325 MG tablet Take 2 tablets (650 mg total) by mouth every 6 (six) hours as needed for mild pain or moderate pain (or Fever >/= 101). 04/14/21   Domenic Moras, PA-C  amLODipine (NORVASC) 10 MG tablet Take 1 tablet (10 mg total) by mouth daily. 02/19/21   Riesa Pope, MD  APIXABAN Arne [place]) VTE STARTER PACK  (10MG  AND 5MG ) Take as directed on package: start with two-5mg  tablets twice daily for 7 days. On day 8, switch to one-5mg  tablet twice daily. 07/23/21   Wyvonnia Dusky, MD  aspirin EC 81 MG EC tablet Take 1 tablet (81 mg total) by mouth daily. Swallow whole. 02/19/21   Riesa Pope, MD  atorvastatin (LIPITOR) 40 MG tablet Take 1 tablet (40 mg total) by mouth daily. 02/19/21   Riesa Pope, MD  losartan (COZAAR) 50 MG tablet Take 1 tablet (50 mg total) by mouth daily. 02/19/21   Katsadouros, Vasilios, MD  pantoprazole (PROTONIX) 40 MG tablet Take 1 tablet (40 mg total) by mouth daily. 03/06/16   Lauree Chandler, NP  polyethylene glycol (MIRALAX / GLYCOLAX) 17 g packet Take 17 g by mouth daily. 02/23/21   Drenda Freeze, MD  potassium chloride (KLOR-CON) 10 MEQ tablet Take 1 tablet (10 mEq total) by mouth daily for 30 doses. 07/22/21 08/21/21  Wyvonnia Dusky, MD  varenicline (CHANTIX) 0.5 MG tablet Take 1 tablet (0.5 mg total) by mouth 2 (two) times daily. Patient not taking: Reported on 03/25/2021 02/19/21   Riesa Pope, MD    Allergies    Cymbalta [duloxetine hcl]  Review of Systems   Review of Systems  Constitutional:  Negative for chills and fever.  Eyes:  Negative for pain and visual disturbance.  Respiratory:  Negative for cough and shortness of breath.   Cardiovascular:  Positive for chest pain. Negative for palpitations.  Gastrointestinal:  Negative for abdominal pain and vomiting.  Genitourinary:  Negative for dysuria and hematuria.  Musculoskeletal:  Negative for arthralgias and back pain.  Skin:  Negative for color change and rash.  Neurological:  Positive for headaches. Negative for syncope.  All other systems reviewed and are negative.  Physical Exam Updated Vital Signs BP (!) 143/87   Pulse 66   Temp 98.8 F (37.1 C) (Oral)   Resp 15   Ht 5\' 2"  (1.575 m)   Wt 99.8 kg   SpO2 99%   BMI 40.24 kg/m   Physical Exam Constitutional:       General: She is not in acute distress. HENT:     Head: Normocephalic and atraumatic.  Eyes:     Conjunctiva/sclera: Conjunctivae normal.     Pupils: Pupils are equal, round, and reactive to light.  Cardiovascular:     Rate and Rhythm: Normal rate and regular rhythm.     Comments: Anterior sternal tenderness on palpation (chest pain reproduced) Pulmonary:     Effort: Pulmonary effort is normal. No respiratory distress.  Abdominal:  General: There is no distension.     Tenderness: There is no abdominal tenderness.  Skin:    General: Skin is warm and dry.  Neurological:     General: No focal deficit present.     Mental Status: She is alert. Mental status is at baseline.  Psychiatric:        Mood and Affect: Mood normal.        Behavior: Behavior normal.    ED Results / Procedures / Treatments   Labs (all labs ordered are listed, but only abnormal results are displayed) Labs Reviewed  BASIC METABOLIC PANEL - Abnormal; Notable for the following components:      Result Value   Potassium 3.1 (*)    Glucose, Bld 129 (*)    BUN 25 (*)    Creatinine, Ser 1.12 (*)    Calcium 8.7 (*)    GFR, Estimated 53 (*)    All other components within normal limits  CBC - Abnormal; Notable for the following components:   MCH 25.7 (*)    All other components within normal limits  TROPONIN I (HIGH SENSITIVITY) - Abnormal; Notable for the following components:   Troponin I (High Sensitivity) 35 (*)    All other components within normal limits  TROPONIN I (HIGH SENSITIVITY) - Abnormal; Notable for the following components:   Troponin I (High Sensitivity) 45 (*)    All other components within normal limits  RESP PANEL BY RT-PCR (FLU A&B, COVID) [id number]  LIPASE, BLOOD    EKG EKG Interpretation  Date/Time:  Monday July 22 2021 17:13:39 EST Ventricular Rate:  78 PR Interval:  136 QRS Duration: 108 QT Interval:  428 QTC Calculation: 488 R Axis:   13 Text Interpretation: Sinus rhythm  Incomplete left bundle branch block Borderline prolonged QT interval Baseline wander in lead(s) V1 Partial missing lead(s): V1 Confirmed by Octaviano Glow [phone]) on 07/22/2021 8:50:36 PM  Radiology DG Chest 2 View  Result Date: 07/22/2021 CLINICAL DATA:  Chest pain EXAM: CHEST - 2 VIEW COMPARISON:  Chest x-rays dated 07/17/2021 and 04/14/2021 FINDINGS: Heart size and mediastinal contours are stable. Lungs are clear. No pleural effusion or pneumothorax is seen. Mild degenerative spondylosis of the thoracic spine. No acute-appearing osseous abnormality. IMPRESSION: No active cardiopulmonary disease. No evidence of pneumonia or pulmonary edema. Electronically Signed   By: Franki Cabot M.D.   On: 07/22/2021 18:05   CT Angio Chest PE W and/or Wo Contrast  Result Date: 07/22/2021 CLINICAL DATA:  Shortness of breath and chest pain. EXAM: CT ANGIOGRAPHY CHEST WITH CONTRAST TECHNIQUE: Multidetector CT imaging of the chest was performed using the standard protocol during bolus administration of intravenous contrast. Multiplanar CT image reconstructions and MIPs were obtained to evaluate the vascular anatomy. CONTRAST:  117mL OMNIPAQUE IOHEXOL 350 MG/ML SOLN COMPARISON:  April 08, 2021 FINDINGS: Cardiovascular: There is moderate severity calcification of the aortic arch, without evidence of aortic aneurysm or dissection. Satisfactory opacification of the pulmonary arteries to the segmental level. A mild amount of intraluminal low attenuation is seen involving right middle lobe and right lower lobe branches of right pulmonary artery (axial CT images 127 through 131 and 144 through 158, CT series 5). This represents a new finding when compared to the prior study. Normal heart size with moderate severity coronary artery calcification. No pericardial effusion. Mediastinum/Nodes: No enlarged mediastinal, hilar, or axillary lymph nodes. Thyroid gland, trachea, and esophagus demonstrate no significant findings.  Lungs/Pleura: Very mild atelectasis is seen within lung bases. There  is no evidence of acute infiltrate, pleural effusion or pneumothorax. Upper Abdomen: There is a small hiatal hernia which contains a small amount of oral contrast. Musculoskeletal: Multilevel degenerative changes seen throughout the thoracic spine. Review of the MIP images confirms the above findings. IMPRESSION: 1. Small amount of pulmonary embolism involving subsegmental right middle lobe and right lower lobe branches of the right pulmonary artery. 2. Small hiatal hernia. 3. Aortic atherosclerosis. Aortic Atherosclerosis (ICD10-I70.0). Electronically Signed   By: Virgina Norfolk M.D.   On: 07/22/2021 21:39    Procedures Procedures   Medications Ordered in ED Medications  apixaban (ELIQUIS) tablet 10 mg (10 mg Oral Given 07/22/21 2237)  sodium chloride 0.9 % bolus 500 mL (0 mLs Intravenous Stopped 07/22/21 2238)  potassium chloride SA (KLOR-CON) CR tablet 40 mEq (40 mEq Oral Given 07/22/21 2051)  ketorolac (TORADOL) 30 MG/ML injection 30 mg (30 mg Intravenous Given 07/22/21 2048)  iohexol (OMNIPAQUE) 350 MG/ML injection 100 mL (100 mLs Intravenous Contrast Given 07/22/21 2104)    ED Course  I have reviewed the triage vital signs and the nursing notes.  Pertinent labs & imaging results that were available during my care of the patient were reviewed by me and considered in my medical decision making (see chart for details).  This patient presents to the Emergency Department with complaint of chest pain. This involves an extensive number of treatment options, and is a complaint that carries with it a high risk of complications and morbidity.  The differential diagnosis includes ACS vs Pneumothorax vs Reflux/Gastritis vs MSK pain vs Pneumonia vs other.  Her pain is extremely reproducible on exam with focal costochondral tenderness to palpation, which she claims is exactly the type of pain she has been feeling.  We will try some  Toradol for this.  Viral syndrome is also possible with her headache.  I have ordered COVID and flu.  Is a risk of clotting and the fact that she has not on anticoagulation, I do think a PE study would be reasonable.  They are in agreement.  I ordered, reviewed, and interpreted labs, including BMP and CBC.  There is mild hypokalemia to be can replete with oral potassium.  Creatinine appears to be at baseline.  Initial troponin was 35, repeat showing I ordered medication Toradol for chest pain I ordered imaging studies which included CT study of the chest and x-ray of the chest I independently visualized and interpreted imaging which showed subsegmental pulmonary embolism I personally reviewed the patients ECG which showed sinus rhythm with no acute ischemic findings  After the interventions stated above, I reevaluated the patient and found that they remained clinically stable, vital stable, no hypoxia or tachycardia.  She was pain-free.   Clinical Course as of 07/23/21 0007  Mon Jul 22, 2021  2151 I engaged in shared clinical decision-making with the patient and her son regarding her pulmonary embolism.  They prefer to go home on blood thinners at this time.  Her PESI score is 80 - low risk overall for 30 day mortality.  Explained importance of needing to maintain a regimen of anticoagulation and follow-up with their PCP. [MT]  2201 She reports complete resolution of her chest pain with Toradol. [MT]  2203 I confirmed with the patient and her son that she has not taken Xarelto.  Both of them deny that she has taken any other blood thinners. [MT]    Clinical Course User Index [MT] Damarrion Mimbs, Carola Rhine, MD    Final Clinical  Impression(s) / ED Diagnoses Final diagnoses:  Chest pain, unspecified type  Hypokalemia  Single subsegmental pulmonary embolism without acute cor pulmonale (HCC)    Rx / DC Orders ED Discharge Orders          Ordered    APIXABAN (ELIQUIS) VTE STARTER PACK (10MG  AND  5MG )  Status:  Discontinued        07/22/21 2203    APIXABAN (ELIQUIS) VTE STARTER PACK (10MG  AND 5MG )        07/22/21 2254    potassium chloride (KLOR-CON) 10 MEQ tablet  Daily,   Status:  Discontinued        07/22/21 2203    potassium chloride (KLOR-CON) 10 MEQ tablet  Daily        07/22/21 2254             Wyvonnia Dusky, MD 07/23/21 0007

## 2021-07-22 NOTE — ED Notes (Signed)
Could not get pt blood work

## 2021-07-22 NOTE — ED Triage Notes (Signed)
Patient reports mid chest pain, slight SOB, and a headache since this AM.

## 2021-07-22 NOTE — ED Provider Notes (Signed)
Emergency Medicine Provider Triage Evaluation Note  Tiffany [name] , a 70 y.o. female  was evaluated in triage.  Pt complains of chest pain and abdominal pain onset this morning.  She denies lightheadedness, fever, shortness of breath, or palpitations.   Review of Systems  Positive: Chest pain, abdominal pain Negative: Constipation, fever, lightheadedness, palpitations  Physical Exam  BP 124/86   Pulse 74   Temp 98.7 F (37.1 C) (Oral)   Resp 16   Ht 5\' 2"  (1.575 m)   SpO2 100%   BMI 42.07 kg/m  Gen:   Awake, no distress   Resp:  Normal effort  MSK:   Moves extremities without difficulty, tenderness to palpation present over anterior chest.  Other:  2+ and symmetrical radial pulses.   Medical Decision Making  Medically screening exam initiated at 5:52 PM.  Appropriate orders placed.  Di A Tesoro was informed that the remainder of the evaluation will be completed by another provider, this initial triage assessment does not replace that evaluation, and the importance of remaining in the ED until their evaluation is complete.     Evlyn Courier, PA-C 07/22/21 1754    Wyvonnia Dusky, MD 07/22/21 [phone]

## 2021-07-22 NOTE — Discharge Instructions (Addendum)
You are diagnosed with a blood clot of the lungs today called a pulmonary embolism.  It is very important you take the blood thinner called Eliquis as prescribed to prevent this clot from getting bigger.  Please follow-up with your doctor as soon as possible this week for recheck of your blood test.  They should check your blood count, your hemoglobin, and also your potassium.  Take the Eliquis as directed on the box.  You will take a higher dose for the first week, then lower the dose next week.  Pay attention for signs of bleeding, including blood in your stool, lightheadedness, paleness.  If you develop the symptoms, reach out to your primary care doctor's office or come back to the ER.  Your potassium level was low today at 3.1.  I also started on potassium for the next 30 days to take once daily.

## 2021-07-27 ENCOUNTER — Emergency Department (HOSPITAL_BASED_OUTPATIENT_CLINIC_OR_DEPARTMENT_OTHER): Payer: Medicare (Managed Care)

## 2021-07-27 ENCOUNTER — Encounter (HOSPITAL_COMMUNITY): Payer: Self-pay

## 2021-07-27 ENCOUNTER — Emergency Department (HOSPITAL_COMMUNITY)
Admission: EM | Admit: 2021-07-27 | Discharge: 2021-07-27 | Disposition: A | Discharge status: Home / Self Care | Payer: Medicare (Managed Care) | Attending: Emergency Medicine | Admitting: Emergency Medicine

## 2021-07-27 ENCOUNTER — Other Ambulatory Visit: Payer: Self-pay

## 2021-07-27 ENCOUNTER — Emergency Department (HOSPITAL_COMMUNITY): Payer: Medicare (Managed Care)

## 2021-07-27 DIAGNOSIS — R0989 Other specified symptoms and signs involving the circulatory and respiratory systems: Secondary | ICD-10-CM

## 2021-07-27 DIAGNOSIS — R209 Unspecified disturbances of skin sensation: Secondary | ICD-10-CM | POA: Diagnosis not present

## 2021-07-27 DIAGNOSIS — F17210 Nicotine dependence, cigarettes, uncomplicated: Secondary | ICD-10-CM | POA: Diagnosis not present

## 2021-07-27 DIAGNOSIS — R0602 Shortness of breath: Secondary | ICD-10-CM | POA: Diagnosis present

## 2021-07-27 DIAGNOSIS — Z96652 Presence of left artificial knee joint: Secondary | ICD-10-CM | POA: Diagnosis not present

## 2021-07-27 DIAGNOSIS — F0390 Unspecified dementia without behavioral disturbance: Secondary | ICD-10-CM | POA: Diagnosis not present

## 2021-07-27 DIAGNOSIS — I10 Essential (primary) hypertension: Secondary | ICD-10-CM | POA: Diagnosis not present

## 2021-07-27 DIAGNOSIS — I739 Peripheral vascular disease, unspecified: Secondary | ICD-10-CM | POA: Diagnosis not present

## 2021-07-27 DIAGNOSIS — E119 Type 2 diabetes mellitus without complications: Secondary | ICD-10-CM | POA: Diagnosis not present

## 2021-07-27 DIAGNOSIS — Z79899 Other long term (current) drug therapy: Secondary | ICD-10-CM | POA: Insufficient documentation

## 2021-07-27 DIAGNOSIS — N179 Acute kidney failure, unspecified: Secondary | ICD-10-CM | POA: Diagnosis not present

## 2021-07-27 DIAGNOSIS — Z7982 Long term (current) use of aspirin: Secondary | ICD-10-CM | POA: Diagnosis not present

## 2021-07-27 DIAGNOSIS — Z7901 Long term (current) use of anticoagulants: Secondary | ICD-10-CM | POA: Diagnosis not present

## 2021-07-27 LAB — CBC WITH DIFFERENTIAL/PLATELET
Abs Immature Granulocytes: 0.03 10*3/uL (ref 0.00–0.07)
Basophils Absolute: 0 10*3/uL (ref 0.0–0.1)
Basophils Relative: 0 %
Eosinophils Absolute: 0.2 10*3/uL (ref 0.0–0.5)
Eosinophils Relative: 2 %
HCT: 40.5 % (ref 36.0–46.0)
Hemoglobin: 12.6 g/dL (ref 12.0–15.0)
Immature Granulocytes: 0 %
Lymphocytes Relative: 17 %
Lymphs Abs: 1.3 10*3/uL (ref 0.7–4.0)
MCH: 26.4 pg (ref 26.0–34.0)
MCHC: 31.1 g/dL (ref 30.0–36.0)
MCV: 84.9 fL (ref 80.0–100.0)
Monocytes Absolute: 0.5 10*3/uL (ref 0.1–1.0)
Monocytes Relative: 6 %
Neutro Abs: 5.8 10*3/uL (ref 1.7–7.7)
Neutrophils Relative %: 75 %
Platelets: 348 10*3/uL (ref 150–400)
RBC: 4.77 MIL/uL (ref 3.87–5.11)
RDW: 14.5 % (ref 11.5–15.5)
WBC: 7.8 10*3/uL (ref 4.0–10.5)
nRBC: 0 % (ref 0.0–0.2)

## 2021-07-27 LAB — COMPREHENSIVE METABOLIC PANEL
ALT: 17 U/L (ref 0–44)
AST: 17 U/L (ref 15–41)
Albumin: 3.5 g/dL (ref 3.5–5.0)
Alkaline Phosphatase: 86 U/L (ref 38–126)
Anion gap: 7 (ref 5–15)
BUN: 28 mg/dL — ABNORMAL HIGH (ref 8–23)
CO2: 30 mmol/L (ref 22–32)
Calcium: 8.9 mg/dL (ref 8.9–10.3)
Chloride: 104 mmol/L (ref 98–111)
Creatinine, Ser: 1.54 mg/dL — ABNORMAL HIGH (ref 0.44–1.00)
GFR, Estimated: 36 mL/min — ABNORMAL LOW (ref >60)
Glucose, Bld: 111 mg/dL — ABNORMAL HIGH (ref 70–99)
Potassium: 3.6 mmol/L (ref 3.5–5.1)
Sodium: 141 mmol/L (ref 135–145)
Total Bilirubin: 0.6 mg/dL (ref 0.3–1.2)
Total Protein: 7.9 g/dL (ref 6.5–8.1)

## 2021-07-27 LAB — TROPONIN I (HIGH SENSITIVITY): Troponin I (High Sensitivity): 18 ng/L — ABNORMAL HIGH (ref <18)

## 2021-07-27 NOTE — ED Triage Notes (Signed)
Pt reports she was recently seen and treated for a PE. Pt arrives today with c/o discoloration to left foot. Foot is cold to the touch and difficult to palpate a pulse. Pt also endorses ongoing sob. Denies cp

## 2021-07-27 NOTE — ED Provider Notes (Signed)
[facility] DEPT Provider Note   CSN: [id number] Arrival date & time: 07/27/21  1545     History Chief Complaint  Patient presents with   Foot Problem   Shortness of Breath    Tiffany [name] is a 70 y.o. female.   Shortness of Breath  70 year old female with a history of GERD, hypertension, status post femoral-popliteal bypass surgery, osteomyelitis of the fifth toe of the left foot, status post amputation, DM with PVD, dementia, PE now on Eliquis presenting to the emergency department with color change and a cool left lower extremity.  The history was provided by the patient's son as the patient does have a component of dementia.  He states that earlier today he noticed that her extremity was much more cool to the touch on the left compared to the right.  She has an extensive history of PVD and has had amputations of her digits.  He was worried about color change as the skin appeared darker on the left compared to the right.  The patient denied any pain in her lower extremities.  She has been taking her Eliquis for recent diagnosis of PE.  Additionally, she continues to endorse shortness of breath associated with her PE.  She denies any current chest pain at this time.  Level 5 caveat due to dementia.  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[place]) 02/13/2021   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 ([place]) 02/06/2021   Gangrene ([place])    Diabetes mellitus with peripheral vascular disease ([place]) 09/13/2015   Generalized anxiety disorder 06/15/2014   Chronic pain 12/28/2013   GERD (gastroesophageal reflux disease) 09/01/2013   Tobacco abuse 09/01/2013   Hypertension    Expected blood loss anemia  06/01/2013   Morbid obesity ([place]) 06/01/2013   S/P left TKA 05/31/2013   Preoperative cardiovascular examination 02/18/2013    Past Surgical History:  Procedure Laterality Date   ABDOMINAL AORTOGRAM W/LOWER EXTREMITY N/A 02/08/2021   Procedure: ABDOMINAL AORTOGRAM W/LOWER EXTREMITY;  Surgeon: Elam Dutch, MD;  Location: [facility];  Service: Cardiovascular;  Laterality: N/A;   AMPUTATION Left 02/13/2021   Procedure: FIFTH TOE AMPUTATION;  Surgeon: [name], MD;  Location: [facility] OR;  Service: Vascular;  Laterality: Left;   FEMORAL-POPLITEAL BYPASS GRAFT Left 02/13/2021   Procedure: BYPASS GRAFT LEFT FEMORAL-POPLITEAL ARTERY;  Surgeon: [name], MD;  Location: [facility];  Service: Vascular;  Laterality: Left;   KNEE SURGERY     arthroscopic right   toe nail removal     TOTAL KNEE ARTHROPLASTY Left 05/31/2013   Procedure: LEFT TOTAL KNEE ARTHROPLASTY;  Surgeon: Mauri Pole, MD;  Location: [facility];  Service: Orthopedics;  Laterality: Left;   VEIN HARVEST Left 02/13/2021   Procedure: VEIN HARVEST LEFT GREATER SAPHENOUS VEIN;  Surgeon: [name], MD;  Location: [facility] OR;  Service: Vascular;  Laterality: Left;     OB History   No obstetric history on file.     Family History  Problem Relation Age of Onset   Heart disease Father        Died of CHF age 68   Heart disease Mother  Died of CHF age 80   Cancer Brother        Cancer   Heart failure Brother        Died of CHF    High blood pressure Son    Colon cancer Neg Hx     Social History   Tobacco Use   Smoking status: Every Day    Packs/day: 0.25    Years: 20.00    Pack years: 5.00    Types: Cigarettes   Smokeless tobacco: Never   Tobacco comments:    5 cig daily   Vaping Use   Vaping Use: Never used  Substance Use Topics   Alcohol use: No    Alcohol/week: 0.0 standard drinks   Drug use: No    Home Medications Prior to Admission medications   Medication Sig Start Date End Date  Taking? Authorizing Provider  acetaminophen (TYLENOL) 500 MG tablet Take 500 mg by mouth every 6 (six) hours as needed for moderate pain.   Yes [provider]  albuterol (VENTOLIN HFA) 108 (90 Base) MCG/ACT inhaler 2 puffs every 6 (six) hours as needed for wheezing or shortness of breath. 07/04/21  Yes [provider]  APIXABAN Arne Cleveland) VTE STARTER PACK (10MG  AND 5MG ) Take as directed on package: start with two-5mg  tablets twice daily for 7 days. On day 8, switch to one-5mg  tablet twice daily. 07/23/21  Yes Trifan, Carola Rhine, MD  losartan-hydrochlorothiazide (HYZAAR) 100-25 MG tablet Take 1 tablet by mouth daily. 07/04/21  Yes [provider]  acetaminophen (TYLENOL) 325 MG tablet Take 2 tablets (650 mg total) by mouth every 6 (six) hours as needed for mild pain or moderate pain (or Fever >/= 101). Patient not taking: Reported on 07/27/2021 04/14/21   Domenic Moras, PA-C  amLODipine (NORVASC) 10 MG tablet Take 1 tablet (10 mg total) by mouth daily. Patient not taking: Reported on 07/27/2021 02/19/21   Riesa Pope, MD  aspirin EC 81 MG EC tablet Take 1 tablet (81 mg total) by mouth daily. Swallow whole. Patient not taking: Reported on 07/27/2021 02/19/21   Riesa Pope, MD  atorvastatin (LIPITOR) 40 MG tablet Take 1 tablet (40 mg total) by mouth daily. Patient not taking: Reported on 07/27/2021 02/19/21   Riesa Pope, MD  losartan (COZAAR) 50 MG tablet Take 1 tablet (50 mg total) by mouth daily. Patient not taking: Reported on 07/27/2021 02/19/21   Riesa Pope, MD  pantoprazole (PROTONIX) 40 MG tablet Take 1 tablet (40 mg total) by mouth daily. Patient not taking: Reported on 07/27/2021 03/06/16   Lauree Chandler, NP  polyethylene glycol (MIRALAX / GLYCOLAX) 17 g packet Take 17 g by mouth daily. Patient not taking: Reported on 07/27/2021 02/23/21   Drenda Freeze, MD  potassium chloride (KLOR-CON) 10 MEQ tablet Take 1 tablet (10 mEq total)  by mouth daily for 30 doses. 07/22/21 08/21/21  Wyvonnia Dusky, MD  varenicline (CHANTIX) 0.5 MG tablet Take 1 tablet (0.5 mg total) by mouth 2 (two) times daily. Patient not taking: Reported on 03/25/2021 02/19/21   Riesa Pope, MD    Allergies    Cymbalta [duloxetine hcl]  Review of Systems   Review of Systems  Unable to perform ROS: Dementia  Respiratory:  Positive for shortness of breath.    Physical Exam Updated Vital Signs BP 138/79   Pulse 79   Temp 98.8 F (37.1 C) (Oral)   Resp 13   Ht 5\' 2"  (1.575 m)   Wt 99 kg  SpO2 100%   BMI 39.92 kg/m   Physical Exam Vitals and nursing note reviewed.  Constitutional:      General: She is not in acute distress. HENT:     Head: Normocephalic and atraumatic.  Eyes:     Conjunctiva/sclera: Conjunctivae normal.     Pupils: Pupils are equal, round, and reactive to light.  Cardiovascular:     Rate and Rhythm: Normal rate and regular rhythm.  Pulmonary:     Effort: Pulmonary effort is normal. No respiratory distress.  Abdominal:     General: There is no distension.     Tenderness: There is no guarding.  Musculoskeletal:        General: No deformity or signs of injury.     Cervical back: Neck supple.     Comments: Right lower extremity with 2+ DP pulses palpated, warm to the touch.  Left lower extremity with extremely faint but palpable DP pulse, cool to the touch, skin appears darkened.  No erythema or tenderness to palpation.  Skin:    Findings: No lesion or rash.  Neurological:     General: No focal deficit present.     Mental Status: She is alert. Mental status is at baseline.    ED Results / Procedures / Treatments   Labs (all labs ordered are listed, but only abnormal results are displayed) Labs Reviewed  COMPREHENSIVE METABOLIC PANEL - Abnormal; Notable for the following components:      Result Value   Glucose, Bld 111 (*)    BUN 28 (*)    Creatinine, Ser 1.54 (*)    GFR, Estimated 36 (*)    All  other components within normal limits  TROPONIN I (HIGH SENSITIVITY) - Abnormal; Notable for the following components:   Troponin I (High Sensitivity) 18 (*)    All other components within normal limits  CBC WITH DIFFERENTIAL/PLATELET    EKG EKG Interpretation  Date/Time:  Saturday July 27 2021 17:25:50 EST Ventricular Rate:  73 PR Interval:  140 QRS Duration: 87 QT Interval:  415 QTC Calculation: 458 R Axis:   38 Text Interpretation: Sinus rhythm Borderline repolarization abnormality Minimal ST elevation, anterior leads Since prior EKG, QTc has shortened Confirmed by Regan Lemming ([id number]) on 07/27/2021 5:29:07 PM  Radiology DG Chest Portable 1 View  Result Date: 07/27/2021 CLINICAL DATA:  Chest pain. Left foot discoloration. Shortness of breath EXAM: PORTABLE CHEST 1 VIEW COMPARISON:  CTA chest from 07/22/2021 FINDINGS: Mild enlargement of the cardiopericardial silhouette, increased from 07/22/2021. Atherosclerotic calcification of the aortic arch. Subtle obscuration of the left hemidiaphragm possibly from mild atelectasis. The lungs appear otherwise clear. Degenerative glenohumeral arthropathy bilaterally. IMPRESSION: 1. Mild enlargement of the cardiopericardial silhouette but increased from 07/22/2021. No edema. 2. Hazy obscuration of left hemidiaphragm potentially from mild left basilar atelectasis. 3.  Aortic Atherosclerosis (ICD10-I70.0). Electronically Signed   By: Van Clines M.D.   On: 07/27/2021 17:40   VAS [place] LOWER EXTREMITY ARTERIAL DUPLEX (ONLY [facility])  Result Date: 07/28/2021 LOWER EXTREMITY ARTERIAL DUPLEX STUDY Patient Name:  Tiffany [name]  Date of Exam:   07/27/2021 Medical Rec #: [id number]         Accession #:    [id number] Date of Birth: April 29, 1951         Patient Gender: F Patient Age:   57 years Exam Location:  [facility] Procedure:      VAS [place] LOWER EXTREMITY ARTERIAL DUPLEX Referring Phys: Regan Lemming  --------------------------------------------------------------------------------  Indications: Cold  discolored left foot. Difficulty in palpating pulse. High Risk Factors: Hypertension, Diabetes, current smoker. Other Factors: PE 07/22/21. Patient on Eliquis.  Vascular Interventions: 02/13/2021 Left common femoral to below-knee popliteal                         artery bypass graft with ipsilateral translocated                         nonreversed saphenous vein. Left fifth toe amputation                         including metatarsal head. Lost to follow up post op. Current ABI:            n/a Limitations: Body habitus, depth of vessels, constant movement Comparison Study: No prior LEA on file Performing Technologist: Sharion Dove RVS  Examination Guidelines: A complete evaluation includes B-mode imaging, spectral Doppler, color Doppler, and power Doppler as needed of all accessible portions of each vessel. Bilateral testing is considered an integral part of a complete examination. Limited examinations for reoccurring indications may be performed as noted.  +-----------+--------+-----+--------+--------+--------+ LEFT       PSV cm/sRatioStenosisWaveformComments +-----------+--------+-----+--------+--------+--------+ CFA Prox   116                                   +-----------+--------+-----+--------+--------+--------+ DFA        70                                    +-----------+--------+-----+--------+--------+--------+ SFA Prox                occluded                 +-----------+--------+-----+--------+--------+--------+ SFA Mid                 occluded                 +-----------+--------+-----+--------+--------+--------+ SFA Distal              occluded                 +-----------+--------+-----+--------+--------+--------+ ATA Prox   63                                    +-----------+--------+-----+--------+--------+--------+ ATA Mid    93                                     +-----------+--------+-----+--------+--------+--------+ ATA Distal 82                                    +-----------+--------+-----+--------+--------+--------+ PTA Mid    19                                    +-----------+--------+-----+--------+--------+--------+ PTA Distal 29                                    +-----------+--------+-----+--------+--------+--------+  PERO Distal20                                    +-----------+--------+-----+--------+--------+--------+  Left Graft #1: common femoral to below knee popliteal with saphenous vein +--------------------+--------+--------+--------+--------+                     PSV cm/sStenosisWaveformComments +--------------------+--------+--------+--------+--------+ Inflow              116                              +--------------------+--------+--------+--------+--------+ Proximal Anastomosis64                               +--------------------+--------+--------+--------+--------+ Proximal Graft      48                               +--------------------+--------+--------+--------+--------+ Mid Graft           53                               +--------------------+--------+--------+--------+--------+ Distal Graft        65                               +--------------------+--------+--------+--------+--------+ Distal Anastomosis  56                               +--------------------+--------+--------+--------+--------+ Outflow             26                               +--------------------+--------+--------+--------+--------+   Summary: Left: Left common femoral to below knee bypass graft appears patent. There is flow noted all the way to the distal AT, PT, Peroneal, and Dorsalis pedis arteries.  See table(s) above for measurements and observations. Electronically signed by Deitra Mayo MD on 07/28/2021 at 12:03:01 PM.    Final     Procedures Procedures    Medications Ordered in ED Medications - No data to display  ED Course  I have reviewed the triage vital signs and the nursing notes.  Pertinent labs & imaging results that were available during my care of the patient were reviewed by me and considered in my medical decision making (see chart for details).    MDM Rules/Calculators/A&P                           70 year old female with a history of GERD, hypertension, status post femoral-popliteal bypass surgery, osteomyelitis of the fifth toe of the left foot, status post amputation, DM with PVD, dementia, PE now on Eliquis presenting to the emergency department with color change and a cool left lower extremity.  The history was provided by the patient's son as the patient does have a component of dementia.  He states that earlier today he noticed that her extremity was much more cool to the touch on the left compared to the right.  She has an  extensive history of PVD and has had amputations of her digits.  He was worried about color change as the skin appeared darker on the left compared to the right.  The patient denied any pain in her lower extremities.  She has been taking her Eliquis for recent diagnosis of PE.  Additionally, she continues to endorse shortness of breath associated with her PE.  She denies any current chest pain at this time.   On arrival, concern for acute on chronic limb ischemia with a cool left lower extremity, faint but barely palpable DP pulses.  Arterial duplex ultrasound of the left leg was ordered.  Additionally, the patient was recently diagnosed with PE and is currently on anticoagulation with Eliquis.  An EKG revealed nonspecific ST changes and a chest x-ray revealed cardiomegaly without pulmonary edema and mild atelectasis without pneumonia.  The patient is hemodynamically stable, afebrile, not tachycardic or tachypneic, BP 118/88, saturating 97% on room air.  Cardiac troponin was mildly elevated at 18, downtrending  from 45 a few days ago from her PE diagnosis, a CMP revealed an elevated creatinine to 1.54 which is elevated compared to the patient's baseline.  The patient is tolerating oral intake.  And ultrasound of the lower extremity arterial duplex was performed Which revealed left common femoral to below-knee bypass graft which appears patent, there is flow noted all the way to the distal AT, PT, peroneal and dorsalis pedis arteries.  Explained the findings to the patient's son who was reassured.  I explained that patient could be experiencing worsening vascular disease but there was no acute occlusion to require immediate intervention at this time.  Recommended very close follow-up with vascular surgery for repeat assessment outpatient and continued management.  Overall, the patient is well-appearing, currently anticoagulated for PE, no acute symptoms of ACS, with a single troponin mildly elevated to 18 which is likely due to to the patient's PE.  Overall feel the patient is stable for discharge at this time with plan for continued outpatient management.    Final Clinical Impression(s) / ED Diagnoses Final diagnoses:  PVD (peripheral vascular disease) ([place])  AKI (acute kidney injury) ([place])    Rx / [place] Orders ED Discharge Orders     None        Regan Lemming, MD 07/29/21 1128

## 2021-07-27 NOTE — Progress Notes (Signed)
VASCULAR LAB    Left lower extremity arterial duplex has been performed.  See CV proc for preliminary results.  Gave verbal report to Dr. [place], [facility], RVT 07/27/2021, 7:49 PM

## 2021-07-27 NOTE — ED Notes (Signed)
US at bedside

## 2021-07-27 NOTE — Discharge Instructions (Signed)
You were evaluated in the Emergency Department and after careful evaluation, we did not find any emergent condition requiring admission or further testing in the hospital.  Your exam/testing today was overall reassuring.  Due to concern for decreased pulses in your left leg with cool extremity, we obtained an ultrasound which revealed good blood flow to that extremity.  No evidence of infection at this time.  Recommend follow-up in clinic with vascular surgery given your history of arterial bypass in your leg.  Please return to the Emergency Department if you experience any worsening of your condition.  Thank you for allowing [place] to be a part of your care.

## 2021-07-27 NOTE — ED Notes (Signed)
Patient has no concerns after AVS has been reviewed and patient education provided. Patient discharged. 

## 2021-12-08 ENCOUNTER — Inpatient Hospital Stay (HOSPITAL_COMMUNITY): Payer: Medicare (Managed Care)

## 2021-12-08 ENCOUNTER — Emergency Department (HOSPITAL_COMMUNITY): Payer: Medicare (Managed Care)

## 2021-12-08 ENCOUNTER — Encounter (HOSPITAL_COMMUNITY): Payer: Self-pay | Admitting: Emergency Medicine

## 2021-12-08 ENCOUNTER — Inpatient Hospital Stay (HOSPITAL_COMMUNITY)
Admission: EM | Admit: 2021-12-08 | Discharge: 2021-12-14 | DRG: 064 | Disposition: A | Discharge status: Skilled Nursing Facility | Payer: Medicare (Managed Care) | Attending: Internal Medicine | Admitting: Internal Medicine

## 2021-12-08 DIAGNOSIS — Z89422 Acquired absence of other left toe(s): Secondary | ICD-10-CM

## 2021-12-08 DIAGNOSIS — N179 Acute kidney failure, unspecified: Secondary | ICD-10-CM | POA: Diagnosis present

## 2021-12-08 DIAGNOSIS — L89151 Pressure ulcer of sacral region, stage 1: Secondary | ICD-10-CM | POA: Diagnosis present

## 2021-12-08 DIAGNOSIS — E669 Obesity, unspecified: Secondary | ICD-10-CM | POA: Diagnosis present

## 2021-12-08 DIAGNOSIS — G9340 Encephalopathy, unspecified: Secondary | ICD-10-CM | POA: Diagnosis present

## 2021-12-08 DIAGNOSIS — E1122 Type 2 diabetes mellitus with diabetic chronic kidney disease: Secondary | ICD-10-CM | POA: Diagnosis present

## 2021-12-08 DIAGNOSIS — B961 Klebsiella pneumoniae [K. pneumoniae] as the cause of diseases classified elsewhere: Secondary | ICD-10-CM | POA: Diagnosis present

## 2021-12-08 DIAGNOSIS — Z23 Encounter for immunization: Secondary | ICD-10-CM | POA: Diagnosis not present

## 2021-12-08 DIAGNOSIS — I129 Hypertensive chronic kidney disease with stage 1 through stage 4 chronic kidney disease, or unspecified chronic kidney disease: Secondary | ICD-10-CM | POA: Diagnosis present

## 2021-12-08 DIAGNOSIS — Z8249 Family history of ischemic heart disease and other diseases of the circulatory system: Secondary | ICD-10-CM

## 2021-12-08 DIAGNOSIS — R569 Unspecified convulsions: Secondary | ICD-10-CM | POA: Diagnosis present

## 2021-12-08 DIAGNOSIS — I10 Essential (primary) hypertension: Secondary | ICD-10-CM | POA: Diagnosis not present

## 2021-12-08 DIAGNOSIS — R29810 Facial weakness: Secondary | ICD-10-CM | POA: Diagnosis present

## 2021-12-08 DIAGNOSIS — E1151 Type 2 diabetes mellitus with diabetic peripheral angiopathy without gangrene: Secondary | ICD-10-CM | POA: Diagnosis present

## 2021-12-08 DIAGNOSIS — N1832 Chronic kidney disease, stage 3b: Secondary | ICD-10-CM | POA: Diagnosis present

## 2021-12-08 DIAGNOSIS — E785 Hyperlipidemia, unspecified: Secondary | ICD-10-CM | POA: Diagnosis present

## 2021-12-08 DIAGNOSIS — K219 Gastro-esophageal reflux disease without esophagitis: Secondary | ICD-10-CM | POA: Diagnosis present

## 2021-12-08 DIAGNOSIS — I6522 Occlusion and stenosis of left carotid artery: Secondary | ICD-10-CM | POA: Diagnosis present

## 2021-12-08 DIAGNOSIS — Z7901 Long term (current) use of anticoagulants: Secondary | ICD-10-CM | POA: Diagnosis not present

## 2021-12-08 DIAGNOSIS — F17210 Nicotine dependence, cigarettes, uncomplicated: Secondary | ICD-10-CM | POA: Diagnosis present

## 2021-12-08 DIAGNOSIS — I6389 Other cerebral infarction: Secondary | ICD-10-CM | POA: Diagnosis not present

## 2021-12-08 DIAGNOSIS — Z713 Dietary counseling and surveillance: Secondary | ICD-10-CM

## 2021-12-08 DIAGNOSIS — E860 Dehydration: Secondary | ICD-10-CM | POA: Diagnosis present

## 2021-12-08 DIAGNOSIS — E1165 Type 2 diabetes mellitus with hyperglycemia: Secondary | ICD-10-CM | POA: Diagnosis present

## 2021-12-08 DIAGNOSIS — M1990 Unspecified osteoarthritis, unspecified site: Secondary | ICD-10-CM | POA: Diagnosis present

## 2021-12-08 DIAGNOSIS — G9341 Metabolic encephalopathy: Secondary | ICD-10-CM | POA: Diagnosis present

## 2021-12-08 DIAGNOSIS — N183 Chronic kidney disease, stage 3 unspecified: Secondary | ICD-10-CM | POA: Diagnosis not present

## 2021-12-08 DIAGNOSIS — Z7982 Long term (current) use of aspirin: Secondary | ICD-10-CM

## 2021-12-08 DIAGNOSIS — Z86711 Personal history of pulmonary embolism: Secondary | ICD-10-CM

## 2021-12-08 DIAGNOSIS — I6330 Cerebral infarction due to thrombosis of unspecified cerebral artery: Secondary | ICD-10-CM | POA: Diagnosis present

## 2021-12-08 DIAGNOSIS — Z79899 Other long term (current) drug therapy: Secondary | ICD-10-CM

## 2021-12-08 DIAGNOSIS — I2699 Other pulmonary embolism without acute cor pulmonale: Secondary | ICD-10-CM | POA: Diagnosis not present

## 2021-12-08 DIAGNOSIS — Z6839 Body mass index (BMI) 39.0-39.9, adult: Secondary | ICD-10-CM

## 2021-12-08 DIAGNOSIS — N390 Urinary tract infection, site not specified: Secondary | ICD-10-CM | POA: Diagnosis present

## 2021-12-08 DIAGNOSIS — R4182 Altered mental status, unspecified: Principal | ICD-10-CM

## 2021-12-08 DIAGNOSIS — F0390 Unspecified dementia without behavioral disturbance: Secondary | ICD-10-CM | POA: Diagnosis present

## 2021-12-08 DIAGNOSIS — I130 Hypertensive heart and chronic kidney disease with heart failure and stage 1 through stage 4 chronic kidney disease, or unspecified chronic kidney disease: Secondary | ICD-10-CM

## 2021-12-08 DIAGNOSIS — Z91148 Patient's other noncompliance with medication regimen for other reason: Secondary | ICD-10-CM

## 2021-12-08 DIAGNOSIS — L8990 Pressure ulcer of unspecified site, unspecified stage: Secondary | ICD-10-CM | POA: Insufficient documentation

## 2021-12-08 DIAGNOSIS — Z888 Allergy status to other drugs, medicaments and biological substances status: Secondary | ICD-10-CM

## 2021-12-08 DIAGNOSIS — Z20822 Contact with and (suspected) exposure to covid-19: Secondary | ICD-10-CM | POA: Diagnosis present

## 2021-12-08 DIAGNOSIS — Z96652 Presence of left artificial knee joint: Secondary | ICD-10-CM | POA: Diagnosis present

## 2021-12-08 LAB — CBC
HCT: 39.5 % (ref 36.0–46.0)
Hemoglobin: 12.5 g/dL (ref 12.0–15.0)
MCH: 27.9 pg (ref 26.0–34.0)
MCHC: 31.6 g/dL (ref 30.0–36.0)
MCV: 88.2 fL (ref 80.0–100.0)
Platelets: 285 10*3/uL (ref 150–400)
RBC: 4.48 MIL/uL (ref 3.87–5.11)
RDW: 13.2 % (ref 11.5–15.5)
WBC: 12.2 10*3/uL — ABNORMAL HIGH (ref 4.0–10.5)
nRBC: 0 % (ref 0.0–0.2)

## 2021-12-08 LAB — COMPREHENSIVE METABOLIC PANEL
ALT: 15 U/L (ref 0–44)
AST: 20 U/L (ref 15–41)
Albumin: 3.8 g/dL (ref 3.5–5.0)
Alkaline Phosphatase: 90 U/L (ref 38–126)
Anion gap: 9 (ref 5–15)
BUN: 36 mg/dL — ABNORMAL HIGH (ref 8–23)
CO2: 22 mmol/L (ref 22–32)
Calcium: 9.4 mg/dL (ref 8.9–10.3)
Chloride: 111 mmol/L (ref 98–111)
Creatinine, Ser: 1.86 mg/dL — ABNORMAL HIGH (ref 0.44–1.00)
GFR, Estimated: 29 mL/min — ABNORMAL LOW (ref >60)
Glucose, Bld: 167 mg/dL — ABNORMAL HIGH (ref 70–99)
Potassium: 4.3 mmol/L (ref 3.5–5.1)
Sodium: 142 mmol/L (ref 135–145)
Total Bilirubin: 0.9 mg/dL (ref 0.3–1.2)
Total Protein: 8.4 g/dL — ABNORMAL HIGH (ref 6.5–8.1)

## 2021-12-08 LAB — CBG MONITORING, ED: Glucose-Capillary: 148 mg/dL — ABNORMAL HIGH (ref 70–99)

## 2021-12-08 LAB — I-STAT CHEM 8, ED
BUN: 30 mg/dL — ABNORMAL HIGH (ref 8–23)
Calcium, Ion: 1.17 mmol/L (ref 1.15–1.40)
Chloride: 105 mmol/L (ref 98–111)
Creatinine, Ser: 2 mg/dL — ABNORMAL HIGH (ref 0.44–1.00)
Glucose, Bld: 155 mg/dL — ABNORMAL HIGH (ref 70–99)
HCT: 33 % — ABNORMAL LOW (ref 36.0–46.0)
Hemoglobin: 11.2 g/dL — ABNORMAL LOW (ref 12.0–15.0)
Potassium: 3.9 mmol/L (ref 3.5–5.1)
Sodium: 139 mmol/L (ref 135–145)
TCO2: 23 mmol/L (ref 22–32)

## 2021-12-08 LAB — DIFFERENTIAL
Abs Immature Granulocytes: 0.05 10*3/uL (ref 0.00–0.07)
Basophils Absolute: 0 10*3/uL (ref 0.0–0.1)
Basophils Relative: 0 %
Eosinophils Absolute: 0.1 10*3/uL (ref 0.0–0.5)
Eosinophils Relative: 0 %
Immature Granulocytes: 0 %
Lymphocytes Relative: 8 %
Lymphs Abs: 1 10*3/uL (ref 0.7–4.0)
Monocytes Absolute: 0.6 10*3/uL (ref 0.1–1.0)
Monocytes Relative: 5 %
Neutro Abs: 10.5 10*3/uL — ABNORMAL HIGH (ref 1.7–7.7)
Neutrophils Relative %: 87 %

## 2021-12-08 LAB — RESP PANEL BY RT-PCR (FLU A&B, COVID) ARPGX2
Influenza A by PCR: NEGATIVE
Influenza B by PCR: NEGATIVE
SARS Coronavirus 2 by RT PCR: NEGATIVE

## 2021-12-08 LAB — PROTIME-INR
INR: 1.2 (ref 0.8–1.2)
Prothrombin Time: 14.6 seconds (ref 11.4–15.2)

## 2021-12-08 LAB — APTT: aPTT: 26 seconds (ref 24–36)

## 2021-12-08 LAB — ETHANOL: Alcohol, Ethyl (B): <10 mg/dL (ref <10)

## 2021-12-08 MED ORDER — ACETAMINOPHEN 325 MG PO TABS: 650.0000 mg | ORAL_TABLET | ORAL | Status: DC | PRN

## 2021-12-08 MED ORDER — ASPIRIN 300 MG RE SUPP
300.0000 mg | Freq: Every day | RECTAL | Status: DC
  Administered 2021-12-09: 300 mg via RECTAL
  Filled 2021-12-08: qty 1

## 2021-12-08 MED ORDER — AMLODIPINE BESYLATE 5 MG PO TABS: 10.0000 mg | ORAL_TABLET | Freq: Every day | ORAL | Status: DC

## 2021-12-08 MED ORDER — ASPIRIN 81 MG PO CHEW
81.0000 mg | CHEWABLE_TABLET | Freq: Every day | ORAL | Status: DC
  Administered 2021-12-10 – 2021-12-11 (×2): 81 mg via ORAL
  Filled 2021-12-08 (×2): qty 1

## 2021-12-08 MED ORDER — ACETAMINOPHEN 160 MG/5ML PO SOLN: 650.0000 mg | ORAL | Status: DC | PRN

## 2021-12-08 MED ORDER — ALBUTEROL SULFATE (2.5 MG/3ML) 0.083% IN NEBU: 2.5000 mg | INHALATION_SOLUTION | RESPIRATORY_TRACT | Status: DC | PRN

## 2021-12-08 MED ORDER — ACETAMINOPHEN 650 MG RE SUPP: 650.0000 mg | RECTAL | Status: DC | PRN

## 2021-12-08 MED ORDER — ACETAMINOPHEN 500 MG PO TABS: 500.0000 mg | ORAL_TABLET | Freq: Four times a day (QID) | ORAL | Status: DC | PRN

## 2021-12-08 MED ORDER — ASPIRIN 81 MG PO TBEC: 81.0000 mg | DELAYED_RELEASE_TABLET | Freq: Every day | ORAL | Status: DC

## 2021-12-08 MED ORDER — ASPIRIN 300 MG RE SUPP
300.0000 mg | Freq: Once | RECTAL | Status: AC
Start: 2021-12-08 — End: 2021-12-09
  Administered 2021-12-09: 300 mg via RECTAL
  Filled 2021-12-08: qty 1

## 2021-12-08 MED ORDER — LACTATED RINGERS IV SOLN: INTRAVENOUS | Status: DC

## 2021-12-08 MED ORDER — IOHEXOL 350 MG/ML SOLN
75.0000 mL | Freq: Once | INTRAVENOUS | Status: AC | PRN
  Administered 2021-12-08: 75 mL via INTRAVENOUS

## 2021-12-08 MED ORDER — SODIUM CHLORIDE 0.9 % IV SOLN: INTRAVENOUS | Status: AC

## 2021-12-08 MED ORDER — STROKE: EARLY STAGES OF RECOVERY BOOK
Freq: Once | Status: DC
  Filled 2021-12-08: qty 1

## 2021-12-08 MED ORDER — SENNOSIDES-DOCUSATE SODIUM 8.6-50 MG PO TABS: 1.0000 | ORAL_TABLET | Freq: Every evening | ORAL | Status: DC | PRN

## 2021-12-08 MED ORDER — ALBUTEROL SULFATE HFA 108 (90 BASE) MCG/ACT IN AERS: 2.0000 | INHALATION_SPRAY | RESPIRATORY_TRACT | Status: DC | PRN

## 2021-12-08 NOTE — ED Notes (Signed)
Pt has been placed on a purwick to retain a urine sample. ?

## 2021-12-08 NOTE — ED Notes (Signed)
Blue top sent to lab. 

## 2021-12-08 NOTE — ED Notes (Signed)
Patient in CT

## 2021-12-08 NOTE — ED Triage Notes (Signed)
Patient BIB son, states approximately one hour ago patient stopped responding to son and had tremors to right hand. States since then decreased responsiveness. A&Ox2. Hx dementia per son.  ?

## 2021-12-08 NOTE — ED Provider Notes (Signed)
?[facility] DEPT ?Provider Note ? ? ?CSN: [id number] ?Arrival date & time: 12/08/21  1750 ? ?  ? ?History ? ?Chief Complaint  ?Patient presents with  ? Altered Mental Status  ? ? ?Tiffany [name] is a 71 y.o. female. ? ?71 year old female presents from home with concern for possible CVA.  According to the son, who is patient's caregiver, patient has dementia and for several weeks has had decreased activity.  Today patient had some right hand shaking which lasted for less than 5 minutes.  Patient poorly had trouble using her right hand as well 2.  There was some unresponsiveness although it is unclear how long it was for.  It resolved spontaneously.  He denies any new facial droop today.  No other weakness appreciated.  Patient does not walk ? ? ?  ? ?Home Medications ?Prior to Admission medications   ?Medication Sig Start Date End Date Taking? Authorizing Provider  ?acetaminophen (TYLENOL) 500 MG tablet Take 500 mg by mouth every 6 (six) hours as needed for moderate pain.   Yes [provider]  ?albuterol (VENTOLIN HFA) 108 (90 Base) MCG/ACT inhaler 2 puffs every 6 (six) hours as needed for wheezing or shortness of breath. 07/04/21  Yes [provider]  ?losartan-hydrochlorothiazide (HYZAAR) 100-25 MG tablet Take 1 tablet by mouth daily. 07/04/21  Yes [provider]  ?acetaminophen (TYLENOL) 325 MG tablet Take 2 tablets (650 mg total) by mouth every 6 (six) hours as needed for mild pain or moderate pain (or Fever >/= 101). ?Patient not taking: Reported on 07/27/2021 04/14/21   Domenic Moras, PA-C  ?amLODipine (NORVASC) 10 MG tablet Take 1 tablet (10 mg total) by mouth daily. ?Patient not taking: Reported on 07/27/2021 02/19/21   Riesa Pope, MD  ?APIXABAN Arne Cleveland) VTE STARTER PACK ('10MG'$  AND '5MG'$ ) Take as directed on package: start with two-'5mg'$  tablets twice daily for 7 days. On day 8, switch to one-'5mg'$  tablet twice daily. ?Patient not taking: Reported on  12/08/2021 07/23/21   Wyvonnia Dusky, MD  ?aspirin EC 81 MG EC tablet Take 1 tablet (81 mg total) by mouth daily. Swallow whole. ?Patient not taking: Reported on 07/27/2021 02/19/21   Riesa Pope, MD  ?atorvastatin (LIPITOR) 40 MG tablet Take 1 tablet (40 mg total) by mouth daily. ?Patient not taking: Reported on 07/27/2021 02/19/21   Riesa Pope, MD  ?losartan (COZAAR) 50 MG tablet Take 1 tablet (50 mg total) by mouth daily. ?Patient not taking: Reported on 07/27/2021 02/19/21   Riesa Pope, MD  ?pantoprazole (PROTONIX) 40 MG tablet Take 1 tablet (40 mg total) by mouth daily. ?Patient not taking: Reported on 07/27/2021 03/06/16   Lauree Chandler, NP  ?polyethylene glycol (MIRALAX / GLYCOLAX) 17 g packet Take 17 g by mouth daily. ?Patient not taking: Reported on 07/27/2021 02/23/21   Drenda Freeze, MD  ?potassium chloride (KLOR-CON) 10 MEQ tablet Take 1 tablet (10 mEq total) by mouth daily for 30 doses. 07/22/21 08/21/21  Wyvonnia Dusky, MD  ?varenicline (CHANTIX) 0.5 MG tablet Take 1 tablet (0.5 mg total) by mouth 2 (two) times daily. ?Patient not taking: Reported on 03/25/2021 02/19/21   Riesa Pope, MD  ?   ? ?Allergies    ?Cymbalta [duloxetine hcl]   ? ?Review of Systems   ?Review of Systems  ?Unable to perform ROS: Acuity of condition  ? ?Physical Exam ?Updated Vital Signs ?BP 121/77 (BP Location: Right Arm)   Pulse 66   Temp 98.3 ?F (36.8 ?  C) (Oral)   Resp 15   SpO2 99%  ?Physical Exam ?Vitals and nursing note reviewed.  ?Constitutional:   ?   General: She is not in acute distress. ?   Appearance: Normal appearance. She is well-developed. She is not toxic-appearing.  ?HENT:  ?   Head: Normocephalic and atraumatic.  ?Eyes:  ?   General: Lids are normal.  ?   Conjunctiva/sclera: Conjunctivae normal.  ?   Pupils: Pupils are equal, round, and reactive to light.  ?Neck:  ?   Thyroid: No thyroid mass.  ?   Trachea: No tracheal deviation.  ?Cardiovascular:  ?   Rate and  Rhythm: Normal rate and regular rhythm.  ?   Heart sounds: Normal heart sounds. No murmur heard. ?  No gallop.  ?Pulmonary:  ?   Effort: Pulmonary effort is normal. No respiratory distress.  ?   Breath sounds: Normal breath sounds. No stridor. No decreased breath sounds, wheezing, rhonchi or rales.  ?Abdominal:  ?   General: There is no distension.  ?   Palpations: Abdomen is soft.  ?   Tenderness: There is no abdominal tenderness. There is no rebound.  ?Musculoskeletal:     ?   General: No tenderness. Normal range of motion.  ?   Cervical back: Normal range of motion and neck supple.  ?Skin: ?   General: Skin is warm and dry.  ?   Findings: No abrasion or rash.  ?Neurological:  ?   General: No focal deficit present.  ?   Mental Status: She is alert. Mental status is at baseline. She is disoriented.  ?   GCS: GCS eye subscore is 4. GCS verbal subscore is 5. GCS motor subscore is 6.  ?   Cranial Nerves: No cranial nerve deficit.  ?   Sensory: No sensory deficit.  ?   Motor: Motor function is intact.  ?   Comments: Strength is 5 of 5 in upper as well as lower extremities.  Good grip strength bilaterally.  No tremor appreciated.  ?Psychiatric:     ?   Attention and Perception: Attention normal.     ?   Mood and Affect: Affect is flat.     ?   Speech: Speech is delayed.     ?   Behavior: Behavior is slowed.  ? ? ?ED Results / Procedures / Treatments   ?Labs ?(all labs ordered are listed, but only abnormal results are displayed) ?Labs Reviewed  ?CBC - Abnormal; Notable for the following components:  ?    Result Value  ? WBC 12.2 (*)   ? All other components within normal limits  ?DIFFERENTIAL - Abnormal; Notable for the following components:  ? Neutro Abs 10.5 (*)   ? All other components within normal limits  ?COMPREHENSIVE METABOLIC PANEL - Abnormal; Notable for the following components:  ? Glucose, Bld 167 (*)   ? BUN 36 (*)   ? Creatinine, Ser 1.86 (*)   ? Total Protein 8.4 (*)   ? GFR, Estimated 29 (*)   ? All other  components within normal limits  ?I-STAT CHEM 8, ED - Abnormal; Notable for the following components:  ? BUN 30 (*)   ? Creatinine, Ser 2.00 (*)   ? Glucose, Bld 155 (*)   ? Hemoglobin 11.2 (*)   ? HCT 33.0 (*)   ? All other components within normal limits  ?CBG MONITORING, ED - Abnormal; Notable for the following components:  ? Glucose-Capillary 148 (*)   ?  All other components within normal limits  ?RESP PANEL BY RT-PCR (FLU A&B, COVID) ARPGX2  ?ETHANOL  ?RAPID URINE DRUG SCREEN, HOSP PERFORMED  ?URINALYSIS, ROUTINE W REFLEX MICROSCOPIC  ?PROTIME-INR  ?APTT  ? ? ?EKG ?EKG Interpretation ? ?Date/Time:  Sunday December 08 2021 18:45:32 EDT ?Ventricular Rate:  67 ?PR Interval:  148 ?QRS Duration: 90 ?QT Interval:  448 ?QTC Calculation: 473 ?R Axis:   55 ?Text Interpretation: Sinus rhythm Minimal ST elevation, anterior leads Confirmed by Lacretia Leigh ([id number]) on 12/08/2021 7:31:39 PM ? ?Radiology ?CT HEAD CODE STROKE WO CONTRAST ? ?Result Date: 12/08/2021 ?CLINICAL DATA:  Code stroke. Patient became unresponsive 1.5 hours ago. Interval improvement. Persistent speech abnormality. EXAM: CT HEAD WITHOUT CONTRAST TECHNIQUE: Contiguous axial images were obtained from the base of the skull through the vertex without intravenous contrast. RADIATION DOSE REDUCTION: This exam was performed according to the departmental dose-optimization program which includes automated exposure control, adjustment of the mA and/or kV according to patient size and/or use of iterative reconstruction technique. COMPARISON:  CT head without contrast 07/17/2021 FINDINGS: Brain: No acute infarct, hemorrhage, or mass lesion is present. Advanced white matter disease is present bilaterally. Remote lacunar infarcts are present in thalami bilaterally. Remote lacunar infarcts are present in the caudate head bilaterally as well. Lentiform nucleus within normal limits. Insular cortex normal. Gray-white differentiation otherwise preserved. The ventricles are  proportionate to the degree of atrophy. No significant extraaxial fluid collection is present. Vascular: Atherosclerotic calcifications are present within the cavernous internal carotid arteries. No hyperdense

## 2021-12-08 NOTE — H&P (Signed)
?History and Physical  ? ? ?Tiffany [name] DOB: 23-Jul-1951 DOA: 12/08/2021 ? ?PCP: Trey Sailors, PA  ?Patient coming from: home ? ?I have personally briefly reviewed patient's old medical records in [place] ? ?Chief Complaint: altered mental status  ?HPI: Tiffany [name] is a 71 y.o. female with medical history significant of hypertension, diabetes, dementia ,and obesity who presents to ED BIB son due to tremors in right hand x 5 minutes followed by altered state described as decrease responsiveness from baseline. Per son who gives history, while helping his mother with a meal he noted her right hand twitching and her staring of into space. She states he tried to get her to respond to him but for a time she did not. She states that she remained with decrease responsiveness from baseline for close to one hour. He however did not she was back to baseline by the time she presented to ED. He also has noted that over the last few months she become less active and less talkative.  Patient on ros notes no fever/chills/ sob/ cough / chest pain , she does note increase urination but no dysuria.  ? ?ED Course:  ?Vitals:  ?Temp 99.1, bp 101/74, Hr 87, rr 18  sat 98% on ra  ?Labs: ?Wbc 12.2, hgb 1.25, plt 285  increase pmn 10.5 ?Na 142, K 4.2, gly 167, cr 1.86 prir 1.54 ?Resp panel neg ?EKG: nsr 67 no st -twave changes  ?UA +, +bacteria, +wbc, + LE ?CT head: ?1. No acute intracranial abnormality. ?2. Advanced white matter disease likely reflects the sequela of ?chronic microvascular ischemia. ?3. Remote lacunar infarcts of the thalami bilaterally and caudate ?head bilaterally. ? ? ? ? ?Review of Systems: As per HPI otherwise 10 point review of systems negative.  ? ?Past Medical History:  ?Diagnosis Date  ? Arthritis   ? osteoarthritis  ? GERD (gastroesophageal reflux disease)   ? Hx of bronchitis   ? Hypertension   ? ? ?Past Surgical History:  ?Procedure Laterality Date  ? ABDOMINAL AORTOGRAM  W/LOWER EXTREMITY N/A 02/08/2021  ? Procedure: ABDOMINAL AORTOGRAM W/LOWER EXTREMITY;  Surgeon: Elam Dutch, MD;  Location: [facility];  Service: Cardiovascular;  Laterality: N/A;  ? AMPUTATION Left 02/13/2021  ? Procedure: FIFTH TOE AMPUTATION;  Surgeon: [name], MD;  Location: [facility] OR;  Service: Vascular;  Laterality: Left;  ? FEMORAL-POPLITEAL BYPASS GRAFT Left 02/13/2021  ? Procedure: BYPASS GRAFT LEFT FEMORAL-POPLITEAL ARTERY;  Surgeon: [name], MD;  Location: [facility];  Service: Vascular;  Laterality: Left;  ? KNEE SURGERY    ? arthroscopic right  ? toe nail removal    ? TOTAL KNEE ARTHROPLASTY Left 05/31/2013  ? Procedure: LEFT TOTAL KNEE ARTHROPLASTY;  Surgeon: Mauri Pole, MD;  Location: [facility];  Service: Orthopedics;  Laterality: Left;  ? VEIN HARVEST Left 02/13/2021  ? Procedure: VEIN HARVEST LEFT GREATER SAPHENOUS VEIN;  Surgeon: [name], MD;  Location: [facility] OR;  Service: Vascular;  Laterality: Left;  ? ? ? reports that she has been smoking cigarettes. She has a 5.00 pack-year smoking history. She has never used smokeless tobacco. She reports that she does not drink alcohol and does not use drugs. ? ?Allergies  ?Allergen Reactions  ? Cymbalta [Duloxetine Hcl] Other (See Comments)  ?  Sweating, bad dreams,  ? ? ?Family History  ?Problem Relation Age of Onset  ? Heart disease Father   ?  Died of CHF age 12  ? Heart disease Mother   ?     Died of CHF age 13  ? Cancer Brother   ?     Cancer  ? Heart failure Brother   ?     Died of CHF   ? High blood pressure Son   ? Colon cancer Neg Hx   ? ? ?Prior to Admission medications   ?Medication Sig Start Date End Date Taking? Authorizing Provider  ?acetaminophen (TYLENOL) 500 MG tablet Take 500 mg by mouth every 6 (six) hours as needed for moderate pain.   Yes [provider]  ?albuterol (VENTOLIN HFA) 108 (90 Base) MCG/ACT inhaler 2 puffs every 6 (six) hours as needed for wheezing or shortness of breath. 07/04/21  Yes  [provider]  ?losartan-hydrochlorothiazide (HYZAAR) 100-25 MG tablet Take 1 tablet by mouth daily. 07/04/21  Yes [provider]  ?acetaminophen (TYLENOL) 325 MG tablet Take 2 tablets (650 mg total) by mouth every 6 (six) hours as needed for mild pain or moderate pain (or Fever >/= 101). ?Patient not taking: Reported on 07/27/2021 04/14/21   Domenic Moras, PA-C  ?amLODipine (NORVASC) 10 MG tablet Take 1 tablet (10 mg total) by mouth daily. ?Patient not taking: Reported on 07/27/2021 02/19/21   Riesa Pope, MD  ?APIXABAN Arne Cleveland) VTE STARTER PACK ('10MG'$  AND '5MG'$ ) Take as directed on package: start with two-'5mg'$  tablets twice daily for 7 days. On day 8, switch to one-'5mg'$  tablet twice daily. ?Patient not taking: Reported on 12/08/2021 07/23/21   Wyvonnia Dusky, MD  ?aspirin EC 81 MG EC tablet Take 1 tablet (81 mg total) by mouth daily. Swallow whole. ?Patient not taking: Reported on 07/27/2021 02/19/21   Riesa Pope, MD  ?atorvastatin (LIPITOR) 40 MG tablet Take 1 tablet (40 mg total) by mouth daily. ?Patient not taking: Reported on 07/27/2021 02/19/21   Riesa Pope, MD  ?losartan (COZAAR) 50 MG tablet Take 1 tablet (50 mg total) by mouth daily. ?Patient not taking: Reported on 07/27/2021 02/19/21   Riesa Pope, MD  ?pantoprazole (PROTONIX) 40 MG tablet Take 1 tablet (40 mg total) by mouth daily. ?Patient not taking: Reported on 07/27/2021 03/06/16   Lauree Chandler, NP  ?polyethylene glycol (MIRALAX / GLYCOLAX) 17 g packet Take 17 g by mouth daily. ?Patient not taking: Reported on 07/27/2021 02/23/21   Drenda Freeze, MD  ?potassium chloride (KLOR-CON) 10 MEQ tablet Take 1 tablet (10 mEq total) by mouth daily for 30 doses. 07/22/21 08/21/21  Wyvonnia Dusky, MD  ?varenicline (CHANTIX) 0.5 MG tablet Take 1 tablet (0.5 mg total) by mouth 2 (two) times daily. ?Patient not taking: Reported on 03/25/2021 02/19/21   Riesa Pope, MD  ? ? ?Physical Exam: ?Vitals:  ?  12/08/21 1845 12/08/21 1915 12/08/21 1924 12/08/21 2000  ?BP: 114/73 121/77 121/77 107/82  ?Pulse: 66  66 69  ?Resp: '13 18 15 16  '$ ?Temp:   98.3 ?F (36.8 ?C)   ?TempSrc:   Oral   ?SpO2: 99% 99% 99% 99%  ?Weight:    98.9 kg  ? ? ? ?Vitals:  ? 12/08/21 1845 12/08/21 1915 12/08/21 1924 12/08/21 2000  ?BP: 114/73 121/77 121/77 107/82  ?Pulse: 66  66 69  ?Resp: '13 18 15 16  '$ ?Temp:   98.3 ?F (36.8 ?C)   ?TempSrc:   Oral   ?SpO2: 99% 99% 99% 99%  ?Weight:    98.9 kg  ?Constitutional: NAD, calm, comfortable ?Eyes: PERRL, lids and conjunctivae  normal ?ENMT: Mucous membranes are moist. Posterior pharynx clear of any exudate or lesions.Normal dentition.  ?Neck: normal, supple, no masses, no thyromegaly ?Respiratory: clear to auscultation bilaterally, no wheezing, no crackles. Normal respiratory effort. No accessory muscle use.  ?Cardiovascular: Regular rate and rhythm, no murmurs / rubs / gallops. No extremity edema. warm. No carotid bruits.  ?Abdomen: no tenderness, no masses palpated. No hepatosplenomegaly. Bowel sounds positive.  ?Musculoskeletal: no clubbing / cyanosis. No joint deformity upper and lower extremities.( Left foot toe amputation healed) Good ROM, no contractures. Normal muscle tone.  ?Skin: no rashes, lesions, ulcers. No induration ?Neurologic: CN 2-12 grossly intact. Sensation intact, . Strength 4+-5/5 in all 4.  ?Psychiatric: Normal judgment and insight. Alert and oriented x 3. Normal mood.  ? ? ?Labs on Admission: I have personally reviewed following labs and imaging studies ? ?CBC: ?Recent Labs  ?Lab 12/08/21 ?1821 12/08/21 ?1843  ?WBC 12.2*  --   ?NEUTROABS 10.5*  --   ?HGB 12.5 11.2*  ?HCT 39.5 33.0*  ?MCV 88.2  --   ?PLT 285  --   ? ?Basic Metabolic Panel: ?Recent Labs  ?Lab 12/08/21 ?1821 12/08/21 ?1843  ?NA 142 139  ?K 4.3 3.9  ?CL 111 105  ?CO2 22  --   ?GLUCOSE 167* 155*  ?BUN 36* 30*  ?CREATININE 1.86* 2.00*  ?CALCIUM 9.4  --   ? ?GFR: ?Estimated Creatinine Clearance: 28.8 mL/min (A) (by C-G  formula based on SCr of 2 mg/dL (H)). ?Liver Function Tests: ?Recent Labs  ?Lab 12/08/21 ?1821  ?AST 20  ?ALT 15  ?ALKPHOS 90  ?BILITOT 0.9  ?PROT 8.4*  ?ALBUMIN 3.8  ? ?No results for input(s): LIPASE, AMYLASE i

## 2021-12-08 NOTE — ED Provider Triage Note (Signed)
Emergency Medicine Provider Triage Evaluation Note ? ?Tiffany [name] , a 71 y.o. female  was evaluated in triage.  Pt's son concerned that pt has had a stroke.  Pt became unresponsive about 1.5 hours ago.  Pt had right hand shaking and was not using her hand.  He reports she is improving but is not talking like she normally does.  Pt doe have a history of dementia  ? ?Review of Systems  ?Positive: Right hand weakness ?Negative: No fever no cough  ? ?Physical Exam  ?BP 101/74 (BP Location: Right Arm)   Pulse 87   Temp 99.1 ?F (37.3 ?C) (Oral)   Resp 18   SpO2 98%  ?Gen:   Awake, no distress   ?Resp:  Normal effort  ?MSK:   Decreased range of motion right hand  ?Other:   ? ?Medical Decision Making  ?Medically screening exam initiated at 6:15 PM.  Appropriate orders placed.  Tiffany [name] was informed that the remainder of the evaluation will be completed by another provider, this initial triage assessment does not replace that evaluation, and the importance of remaining in the ED until their evaluation is complete. ? ?  ?Fransico Meadow, [place] ?12/08/21 1817 ? ?

## 2021-12-09 ENCOUNTER — Inpatient Hospital Stay (HOSPITAL_COMMUNITY): Payer: Medicare (Managed Care)

## 2021-12-09 ENCOUNTER — Encounter (HOSPITAL_COMMUNITY): Payer: Medicare (Managed Care)

## 2021-12-09 ENCOUNTER — Inpatient Hospital Stay (HOSPITAL_COMMUNITY)
Admit: 2021-12-09 | Discharge: 2021-12-09 | Disposition: A | Discharge status: Home / Self Care | Payer: Medicare (Managed Care) | Attending: Internal Medicine | Admitting: Internal Medicine

## 2021-12-09 DIAGNOSIS — G9340 Encephalopathy, unspecified: Secondary | ICD-10-CM

## 2021-12-09 DIAGNOSIS — I10 Essential (primary) hypertension: Secondary | ICD-10-CM

## 2021-12-09 DIAGNOSIS — I6389 Other cerebral infarction: Secondary | ICD-10-CM

## 2021-12-09 DIAGNOSIS — I639 Cerebral infarction, unspecified: Secondary | ICD-10-CM

## 2021-12-09 DIAGNOSIS — F0390 Unspecified dementia without behavioral disturbance: Secondary | ICD-10-CM

## 2021-12-09 LAB — RAPID URINE DRUG SCREEN, HOSP PERFORMED
Amphetamines: NOT DETECTED
Barbiturates: NOT DETECTED
Benzodiazepines: NOT DETECTED
Cocaine: NOT DETECTED
Opiates: NOT DETECTED
Tetrahydrocannabinol: NOT DETECTED

## 2021-12-09 LAB — BLOOD GAS, VENOUS
Acid-base deficit: 1.3 mmol/L (ref 0.0–2.0)
Bicarbonate: 24.8 mmol/L (ref 20.0–28.0)
O2 Saturation: 50.8 %
Patient temperature: 37.7
pCO2, Ven: 47 mmHg (ref 44–60)
pH, Ven: 7.33 (ref 7.25–7.43)
pO2, Ven: <31 mmHg — CL (ref 32–45)

## 2021-12-09 LAB — AMMONIA: Ammonia: 21 umol/L (ref 9–35)

## 2021-12-09 LAB — URINALYSIS, ROUTINE W REFLEX MICROSCOPIC
Bilirubin Urine: NEGATIVE
Glucose, UA: NEGATIVE mg/dL
Ketones, ur: NEGATIVE mg/dL
Nitrite: NEGATIVE
Protein, ur: NEGATIVE mg/dL
Specific Gravity, Urine: >1.046 — ABNORMAL HIGH (ref 1.005–1.030)
pH: 5 (ref 5.0–8.0)

## 2021-12-09 LAB — LIPID PANEL
Cholesterol: 128 mg/dL (ref 0–200)
HDL: 32 mg/dL — ABNORMAL LOW (ref >40)
LDL Cholesterol: 77 mg/dL (ref 0–99)
Total CHOL/HDL Ratio: 4 RATIO
Triglycerides: 97 mg/dL (ref <150)
VLDL: 19 mg/dL (ref 0–40)

## 2021-12-09 LAB — HEMOGLOBIN A1C
Hgb A1c MFr Bld: 6.4 % — ABNORMAL HIGH (ref 4.8–5.6)
Mean Plasma Glucose: 136.98 mg/dL

## 2021-12-09 LAB — ECHOCARDIOGRAM COMPLETE
AV Mean grad: 4 mmHg
AV Peak grad: 7.6 mmHg
Ao pk vel: 1.38 m/s
Area-P 1/2: 3.48 cm2
Weight: 3488 oz

## 2021-12-09 MED ORDER — SODIUM CHLORIDE 0.9 % IV SOLN
250.0000 mg | Freq: Two times a day (BID) | INTRAVENOUS | Status: DC
  Administered 2021-12-09 – 2021-12-11 (×5): 250 mg via INTRAVENOUS
  Filled 2021-12-09 (×6): qty 2.5

## 2021-12-09 MED ORDER — SODIUM CHLORIDE 0.9 % IV SOLN
1.0000 g | INTRAVENOUS | Status: AC
  Administered 2021-12-09 – 2021-12-11 (×3): 1 g via INTRAVENOUS
  Filled 2021-12-09 (×3): qty 10

## 2021-12-09 MED ORDER — PERFLUTREN LIPID MICROSPHERE
1.0000 mL | INTRAVENOUS | Status: AC | PRN
  Administered 2021-12-09: 2 mL via INTRAVENOUS
  Filled 2021-12-09: qty 10

## 2021-12-09 MED ORDER — PNEUMOCOCCAL 20-VAL CONJ VACC 0.5 ML IM SUSY
0.5000 mL | PREFILLED_SYRINGE | INTRAMUSCULAR | Status: DC
Start: 2021-12-10 — End: 2021-12-14
  Filled 2021-12-09: qty 0.5

## 2021-12-09 MED ORDER — ENSURE ENLIVE PO LIQD
237.0000 mL | Freq: Two times a day (BID) | ORAL | Status: DC
  Administered 2021-12-10 – 2021-12-13 (×6): 237 mL via ORAL

## 2021-12-09 NOTE — Progress Notes (Signed)
PT Cancellation Note ? ?Patient Details ?Name: CINDERELLA CHRISTOFFERSEN ?MRN: [id number] ?DOB: 1950-08-26 ? ? ?Cancelled Treatment:    Reason Eval/Treat Not Completed: Medical issues which prohibited therapy, patient to transfer to [facility] for stroke workup. ?Tresa Endo PT ?Acute Rehabilitation Services ?Pager [phone] ?Office [phone] ? ? ? ?Nain Rudd, Shella Maxim ?12/09/2021, 9:33 AM ?

## 2021-12-09 NOTE — Progress Notes (Signed)
EEG complete - results pending 

## 2021-12-09 NOTE — Progress Notes (Signed)
Same-day progress note-after MRI. ?MRI brain completed-preliminary review was negative but the radiologist read reports acute/subacute punctate focus of restricted diffusion. ?Will benefit from full stroke work-up to include CTA, telemetry, A1c, lipid panel, 2D echo. ?Transfer to [facility] ?Stroke team to follow. ?Plan communicated to Dr. Nevada Crane ? ? ?-- ?Amie Portland, MD ?Neurologist ?[facility] ?Pager: [phone] ? ?

## 2021-12-09 NOTE — Evaluation (Signed)
Clinical/Bedside Swallow Evaluation ?Patient Details  ?Name: Tiffany [name] ?MRN: [id number] ?Date of Birth: Mar 31, 1951 ? ?Today's Date: 12/09/2021 ?Time: SLP Start Time (ACUTE ONLY): 3748 SLP Stop Time (ACUTE ONLY): 2707 ?SLP Time Calculation (min) (ACUTE ONLY): 20 min ? ?Past Medical History:  ?Past Medical History:  ?Diagnosis Date  ? Arthritis   ? osteoarthritis  ? GERD (gastroesophageal reflux disease)   ? Hx of bronchitis   ? Hypertension   ? ?Past Surgical History:  ?Past Surgical History:  ?Procedure Laterality Date  ? ABDOMINAL AORTOGRAM W/LOWER EXTREMITY N/A 02/08/2021  ? Procedure: ABDOMINAL AORTOGRAM W/LOWER EXTREMITY;  Surgeon: Elam Dutch, MD;  Location: [place] [facility];  Service: Cardiovascular;  Laterality: N/A;  ? AMPUTATION Left 02/13/2021  ? Procedure: FIFTH TOE AMPUTATION;  Surgeon: [name], MD;  Location: [facility] OR;  Service: Vascular;  Laterality: Left;  ? FEMORAL-POPLITEAL BYPASS GRAFT Left 02/13/2021  ? Procedure: BYPASS GRAFT LEFT FEMORAL-POPLITEAL ARTERY;  Surgeon: [name], MD;  Location: [place];  Service: Vascular;  Laterality: Left;  ? KNEE SURGERY    ? arthroscopic right  ? toe nail removal    ? TOTAL KNEE ARTHROPLASTY Left 05/31/2013  ? Procedure: LEFT TOTAL KNEE ARTHROPLASTY;  Surgeon: Mauri Pole, MD;  Location: [facility];  Service: Orthopedics;  Laterality: Left;  ? VEIN HARVEST Left 02/13/2021  ? Procedure: VEIN HARVEST LEFT GREATER SAPHENOUS VEIN;  Surgeon: [name], MD;  Location: [place];  Service: Vascular;  Laterality: Left;  ? ?HPI:  ?Patient is a 71 y.o. female with PMH: HTN, dementia GERD, arthritis who presented to the hospital with episode concerning for partial seizure. She had jerking of right hand that lasted approximately 5 minutes followed by decreased responsiveness  which gradually improved over the course of the hour. MRI showed Punctate acute to subacute right central white matter infarct.  ?  ?Assessment / Plan / Recommendation  ?Clinical  Impression ? Patient is presenting with clinical s/s of dysphagia as per this bedside/clinical swallow evaluation. Although SLP did not observe any overt s/s aspiration or penetration ( no coughing, throat clearing or change in vocal quality), she did exhibit a delayed initiation of swallow that appeared to primarily be at pharyngael phase. She was able to masticate saltine crackers with mild decreased mastication which suspect secondary to missing some dentition. SLP is recommending initiate Dys 3 solids, thin liquids but also recomending an objective swallow study (MBS) to r/o dysphagia. Patient to be transported from [facility] to [facility] for full stroke work-up and so MBS will need to be completed when patient at [facility] today if possible, otherwise next date. ?SLP Visit Diagnosis: Dysphagia, unspecified (R13.10) ?   ?Aspiration Risk ? Mild aspiration risk  ?  ?Diet Recommendation Dysphagia 3 (Mech soft);Thin liquid  ? ?Liquid Administration via: Cup;Straw ?Medication Administration: Whole meds with liquid ?Supervision: Patient able to self feed;Intermittent supervision to cue for compensatory strategies ?Compensations: Slow rate;Small sips/bites;Minimize environmental distractions ?Postural Changes: Seated upright at 90 degrees  ?  ?Other  Recommendations Oral Care Recommendations: Oral care BID   ? ?Recommendations for follow up therapy are one component of a multi-disciplinary discharge planning process, led by the attending physician.  Recommendations may be updated based on patient status, additional functional criteria and insurance authorization. ? ?Follow up Recommendations Other (comment) (TBD)  ? ? ?  ?Assistance Recommended at Discharge Frequent or constant Supervision/Assistance  ?Functional Status Assessment Patient has had a recent decline in their functional status and demonstrates  the ability to make significant improvements in function in a reasonable and predictable amount of time.  ?Frequency and Duration  min 2x/week  ?1 week ?  ?   ? ?Prognosis Prognosis for Safe Diet Advancement: Good  ? ?  ? ?Swallow Study   ?General Date of Onset: 12/08/21 ?HPI: Patient is a 71 y.o. female with PMH: HTN, dementia GERD, arthritis who presented to the hospital with episode concerning for partial seizure. She had jerking of right hand that lasted approximately 5 minutes followed by decreased responsiveness  which gradually improved over the course of the hour. MRI showed Punctate acute to subacute right central white matter infarct. ?Type of Study: Bedside Swallow Evaluation ?Previous Swallow Assessment: none found ?Diet Prior to this Study: NPO ?Temperature Spikes Noted: No ?Respiratory Status: Room air ?History of Recent Intubation: No ?Behavior/Cognition: Alert;Cooperative;Pleasant mood ?Oral Cavity Assessment: Within Functional Limits ?Oral Cavity - Dentition: Missing dentition;Adequate natural dentition ?Vision: Functional for self-feeding ?Self-Feeding Abilities: Able to feed self ?Patient Positioning: Upright in bed ?Baseline Vocal Quality: Normal ?Volitional Swallow: Able to elicit  ?  ?Oral/Motor/Sensory Function Overall Oral Motor/Sensory Function: Mild impairment ?Facial ROM: Reduced left ?Facial Symmetry: Abnormal symmetry left ?Lingual ROM: Within Functional Limits ?Lingual Symmetry: Within Functional Limits ?Lingual Strength: Within Functional Limits   ?Ice Chips     ?Thin Liquid Thin Liquid: Impaired ?Presentation: Straw;Self Fed ?Pharyngeal  Phase Impairments: Suspected delayed Swallow  ?  ?Nectar Thick     ?Honey Thick     ?Puree Puree: Within functional limits   ?Solid ? ? ?  Solid: Impaired ?Oral Phase Impairments: Impaired mastication  ? ?  ?Sonia Baller, MA, CCC-SLP ?Speech Therapy ? ? ? ?

## 2021-12-09 NOTE — Consult Note (Signed)
Neurology Consultation ?Reason for Consult: Seizure ?Referring Physician: Zenia Resides, A ? ?CC: Seizure ? ?History is obtained from:Patient, chart ? ?HPI: Tiffany [name] is a 71 y.o. female with a history of hypertension and dementia who presents with an episode concerning for partial seizure.  She had jerking of her right hand that lasted for approximately 5 minutes followed by decreased responsiveness.  This gradually improved back to baseline over the course of an hour. She also had a staring spell without jerking about two weeks ago.  ? ?At baseline, she needs help dressing herself. She cannot make food for herself and sometimes needs help feeding herself as well(forgets she has food in front of her). ? ?Also, he has noticed her left face has not been quite normal for at least a couple of weeks.  ? ?LKW: at least two weeks ago.  ?Tpa given: no out of window.  ? ?Past Medical History:  ?Diagnosis Date  ? Arthritis   ? osteoarthritis  ? GERD (gastroesophageal reflux disease)   ? Hx of bronchitis   ? Hypertension   ? ? ? ?Family History  ?Problem Relation Age of Onset  ? Heart disease Father   ?     Died of CHF age 71  ? Heart disease Mother   ?     Died of CHF age 54  ? Cancer Brother   ?     Cancer  ? Heart failure Brother   ?     Died of CHF   ? High blood pressure Son   ? Colon cancer Neg Hx   ? ? ? ?Social History:  reports that she has been smoking cigarettes. She has a 5.00 pack-year smoking history. She has never used smokeless tobacco. She reports that she does not drink alcohol and does not use drugs. ? ? ?Exam: ?Current vital signs: ?BP 130/89   Pulse 69   Temp 98.3 ?F (36.8 ?C) (Oral)   Resp 17   Wt 98.9 kg   SpO2 99%   BMI 39.87 kg/m?  ?Vital signs in last 24 hours: ?Temp:  [98.3 ?F (36.8 ?C)-99.1 ?F (37.3 ?C)] 98.3 ?F (36.8 ?C) (04/16 1924) ?Pulse Rate:  [66-87] 69 (04/17 0030) ?Resp:  [13-18] 17 (04/17 0030) ?BP: (101-130)/(73-91) 130/89 (04/17 0030) ?SpO2:  [98 %-99 %] 99 % (04/17 0030) ?Weight:   [98.9 kg] 98.9 kg (04/16 2000) ? ? ?Physical Exam  ?Constitutional: Appears well-developed and well-nourished.  ?Psych: Affect appropriate to situation ?Eyes: No scleral injection ?HENT: No OP obstruction ?MSK: no joint deformities.  ?Cardiovascular: Normal rate and regular rhythm.  ?Respiratory: Effort normal, non-labored breathing ?GI: Soft.  No distension. There is no tenderness.  ?Skin: WDI ? ?Neuro: ?Mental Status: ?Patient is awake, alert, oriented to person, but not month or year.  ?No signs of aphasia or neglect ?Cranial Nerves: ?II: Visual Fields are full. Pupils are equal, round, and reactive to light.   ?III,IV, VI: EOMI without ptosis or diploplia.  ?V: Facial sensation is diminished on the left  ?VII: Facial movement with left facial weakness.  ?VIII: hearing is intact to voice ?X: Uvula elevates symmetrically ?XI: Shoulder shrug is symmetric. ?XII: tongue is midline without atrophy or fasciculations.  ?Motor: ?Tone is normal. Bulk is normal. 5/5 strength was present in all four extremities.  ?Sensory: ?Sensation is diminished on the left ?Cerebellar: ?FNF and HKS are intact bilaterally ? ? ?I have reviewed labs in epic and the results pertinent to this consultation are: ?Ammonia 21 ?Cr  1.86 ? ?I have reviewed the images obtained: CTA head neck-negative ?MRI - negative ? ?Impression: 71 year old female with a history of dementia who presents with episode of right arm shaking followed by postictal confusion.  This is highly concerning for partial seizure. With the previous episode as well, I would favor starting antiepileptic therapy. If MRI is negative, can start keppra and follow up as outpatient. If MRI is positive, will need stroke workup as well.  ? ?Recommendations: ?1) start keppra '250mg'$  BID ?2) EEG ?3) MRI brian ?4) Stroke workup if MRI is positive.  ? ? ?Roland Rack, MD ?[facility] ?[phone] ? ?If 7pm- 7am, please page neurology on call as listed in [place]. ? ?

## 2021-12-09 NOTE — Progress Notes (Signed)
Carotid Duplex has been completed.   ? ? ?Results can be found under chart review under CV PROC. ?12/09/2021 3:50 PM ?Jeron Grahn RVT, RDMS ? ?

## 2021-12-09 NOTE — Progress Notes (Addendum)
? ?PROGRESS NOTE ? ?Tiffany [name] VQM:[id number] DOB: 1951/08/22 DOA: 12/08/2021 ?PCP: Trey Sailors, PA ? ?HPI/Recap of past 24 hours: ?From H&P dictated by Dr. Marcello Moores: " Tiffany [name] is a 71 y.o. female with medical history significant of hypertension, diabetes, dementia ,and obesity who presents to ED BIB son due to tremors in right hand x 5 minutes followed by altered state described as decrease responsiveness from baseline. Per son who gives history, while helping his mother with a meal he noted her right hand twitching and her staring of into space. She states he tried to get her to respond to him but for a time she did not. She states that she remained with decrease responsiveness from baseline for close to one hour. He however did not she was back to baseline by the time she presented to ED. He also has noted that over the last few months she become less active and less talkative.  Patient on ros notes no fever/chills/ sob/ cough / chest pain , she does note increase urination but no dysuria." ? ?12/09/2021: Patient was seen and examined at her bedside.  She is alert oriented x2.  Pleasantly demented.  Acute CVA seen on MRI.  Discussed with neurology Dr. Malen Gauze, will transfer to [facility] for complete CVA work-up. ? ?Assessment/Plan: ?Principal Problem: ?  Encephalopathy acute ? ?Acute metabolic encephalopathy secondary to acute CVA, seen on MRI brain. ?MRI brain done on 12/10/2018 revealed: Punctate acute to subacute right central white matter infarct. Stable findings of advanced chronic microvascular ischemic changes and numerous hypertensive chronic microhemorrhages ?Discussed with neurology Dr. Malen Gauze, will transfer to [facility] for complete CVA work-up. ?MRA head, bilateral Doppler ultrasound, 2D echo, lipid panel/A1c, PT OT speech evaluation. ?Baseline dementia.  Reorient as needed ? 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 ?Ongoing work-up  as stated above. ?LDL 77, goal less than 70. ?A1c 6.4, at goal. ?Defer management to neurology. ? ?Questionable partial seizure ?Seen by neurology, management per neurology. ?Seizures precaution. ? ?Prediabetes with hyperglycemia ?Last hemoglobin A1c 6.4 ?Insulin sliding scale ?Continue gentle IV fluid hydration. ? ?AKI on CKD 3B, suspect prerenal in setting of poor oral intake. ?Baseline creatinine appears to be 1.5 with GFR 36 ?Presented with creatinine of 1.86, creatinine is uptrending to 2.0 ?Continue gentle IV fluid hydration ?Avoid nephrotoxic agents, dehydration and hypotension. ?Monitor urine output ? ?Essential hypertension ?BP not at goal, elevated. ? ?Presented with UTI ?She is on Rocephin, continue. ? ? ? ?Code Status: Full code. ? ?Family Communication: Updated her husband via phone. ? ?Disposition Plan: Transfer to [facility] for complete stroke work-up. ? ? ?Consultants: ?Neurology/stroke team following. ? ?Procedures: ?2D echo. ? ?Antimicrobials: ?Rocephin. ? ?DVT prophylaxis: ?SCDs ? ?Status is: Inpatient ?Patient requires at least 2 midnights for further evaluation and treatment of present condition. ? ? ? ?Objective: ?Vitals:  ? 12/09/21 1215 12/09/21 1230 12/09/21 1245 12/09/21 1300  ?BP: 120/78 126/83 (!) 127/95 (!) 133/115  ?Pulse: 70  86 82  ?Resp: 14 (!) '21 20 11  '$ ?Temp:      ?TempSrc:      ?SpO2: 99%  99% 98%  ?Weight:      ? ?No intake or output data in the 24 hours ending 12/09/21 1327 ?Filed Weights  ? 12/08/21 2000  ?Weight: 98.9 kg  ? ? ?Exam: ? ?General: 71 y.o. year-old female well developed well nourished in no acute distress.  Alert and pleasantly confused. ?Cardiovascular: Regular rate  and rhythm with no rubs or gallops.  No thyromegaly or JVD noted.   ?Respiratory: Clear to auscultation with no wheezes or rales. Good inspiratory effort. ?Abdomen: Soft nontender nondistended with normal bowel sounds x4 quadrants. ?Musculoskeletal: No lower extremity edema. 2/4 pulses in  all 4 extremities. ?Skin: No ulcerative lesions noted or rashes, ?Psychiatry: Mood is appropriate for condition and setting ? ? ?Data Reviewed: ?CBC: ?Recent Labs  ?Lab 12/08/21 ?1821 12/08/21 ?1843  ?WBC 12.2*  --   ?NEUTROABS 10.5*  --   ?HGB 12.5 11.2*  ?HCT 39.5 33.0*  ?MCV 88.2  --   ?PLT 285  --   ? ?Basic Metabolic Panel: ?Recent Labs  ?Lab 12/08/21 ?1821 12/08/21 ?1843  ?NA 142 139  ?K 4.3 3.9  ?CL 111 105  ?CO2 22  --   ?GLUCOSE 167* 155*  ?BUN 36* 30*  ?CREATININE 1.86* 2.00*  ?CALCIUM 9.4  --   ? ?GFR: ?Estimated Creatinine Clearance: 28.8 mL/min (A) (by C-G formula based on SCr of 2 mg/dL (H)). ?Liver Function Tests: ?Recent Labs  ?Lab 12/08/21 ?1821  ?AST 20  ?ALT 15  ?ALKPHOS 90  ?BILITOT 0.9  ?PROT 8.4*  ?ALBUMIN 3.8  ? ?No results for input(s): LIPASE, AMYLASE in the last 168 hours. ?Recent Labs  ?Lab 12/09/21 ?2878  ?AMMONIA 21  ? ?Coagulation Profile: ?Recent Labs  ?Lab 12/08/21 ?1957  ?INR 1.2  ? ?Cardiac Enzymes: ?No results for input(s): CKTOTAL, CKMB, CKMBINDEX, TROPONINI in the last 168 hours. ?BNP (last 3 results) ?No results for input(s): PROBNP in the last 8760 hours. ?HbA1C: ?Recent Labs  ?  12/09/21 ?0336  ?HGBA1C 6.4*  ? ?CBG: ?Recent Labs  ?Lab 12/08/21 ?1835  ?GLUCAP 148*  ? ?Lipid Profile: ?Recent Labs  ?  12/09/21 ?0336  ?CHOL 128  ?HDL 32*  ?LDLCALC 77  ?TRIG 97  ?CHOLHDL 4.0  ? ?Thyroid Function Tests: ?No results for input(s): TSH, T4TOTAL, FREET4, T3FREE, THYROIDAB in the last 72 hours. ?Anemia Panel: ?No results for input(s): VITAMINB12, FOLATE, FERRITIN, TIBC, IRON, RETICCTPCT in the last 72 hours. ?Urine analysis: ?   ?Component Value Date/Time  ? COLORURINE YELLOW 12/08/2021 0015  ? APPEARANCEUR CLOUDY (A) 12/08/2021 0015  ? LABSPEC >1.046 (H) 12/08/2021 0015  ? PHURINE 5.0 12/08/2021 0015  ? GLUCOSEU NEGATIVE 12/08/2021 0015  ? HGBUR SMALL (A) 12/08/2021 0015  ? [place] NEGATIVE 12/08/2021 0015  ? [place] NEGATIVE 12/08/2021 0015  ? PROTEINUR NEGATIVE 12/08/2021 0015   ? UROBILINOGEN 1.0 03/07/2014 1318  ? NITRITE NEGATIVE 12/08/2021 0015  ? LEUKOCYTESUR LARGE (A) 12/08/2021 0015  ? ?Sepsis Labs: ?'@LABRCNTIP'$ (procalcitonin:4,lacticidven:4) ? ?) ?Recent Results (from the past 240 hour(s))  ?Resp Panel by RT-PCR (Flu A&B, Covid) Nasopharyngeal Swab     Status: None  ? Collection Time: 12/08/21  6:44 PM  ? Specimen: Nasopharyngeal Swab; Nasopharyngeal(NP) swabs in vial transport medium  ?Result Value Ref Range Status  ? SARS Coronavirus 2 by RT PCR NEGATIVE NEGATIVE Final  ?  Comment: (NOTE) ?SARS-CoV-2 target nucleic acids are NOT DETECTED. ? ?The SARS-CoV-2 RNA is generally detectable in upper respiratory ?specimens during the acute phase of infection. The lowest ?concentration of SARS-CoV-2 viral copies this assay can detect is ?138 copies/mL. A negative result does not preclude SARS-Cov-2 ?infection and should not be used as the sole basis for treatment or ?other patient management decisions. A negative result may occur with  ?improper specimen collection/handling, submission of specimen other ?than nasopharyngeal swab, presence of viral mutation(s) within the ?areas targeted by this  assay, and inadequate number of viral ?copies(<138 copies/mL). A negative result must be combined with ?clinical observations, patient history, and epidemiological ?information. The expected result is Negative. ? ?Fact Sheet for Patients:  ?EntrepreneurPulse.com.au ? ?Fact Sheet for Healthcare Providers:  ?IncredibleEmployment.be ? ?This test is no t yet approved or cleared by the [place] FDA and  ?has been authorized for detection and/or diagnosis of SARS-CoV-2 by ?FDA under an Emergency Use Authorization (EUA). This EUA will remain  ?in effect (meaning this test can be used) for the duration of the ?COVID-19 declaration under Section 564(b)(1) of the Act, 21 ?U.S.C.section 360bbb-3(b)(1), unless the authorization is terminated  ?or revoked sooner.  ? ? ?  ?  Influenza A by PCR NEGATIVE NEGATIVE Final  ? Influenza B by PCR NEGATIVE NEGATIVE Final  ?  Comment: (NOTE) ?The Xpert Xpress SARS-CoV-2/FLU/RSV plus assay is intended as an aid ?in the diagnosis of in

## 2021-12-09 NOTE — Procedures (Signed)
Patient Name: Tiffany [name]  ?MRN: [id number]  ?Epilepsy Attending: Lora Havens  ?Referring Physician/Provider: Clance Boll, MD ?Date: 12/09/2021 ?Duration: 27.13 mins ? ?Patient history: 71 year old female with a history of dementia who presents with episode of right arm shaking followed by postictal confusion. EEG to evaluate for seizure. ? ?Level of alertness: Awake, asleep ? ?AEDs during EEG study: LEV ? ?Technical aspects: This EEG study was done with scalp electrodes positioned according to the 10-20 International system of electrode placement. Electrical activity was acquired at a sampling rate of '500Hz'$  and reviewed with a high frequency filter of '70Hz'$  and a low frequency filter of '1Hz'$ . EEG data were recorded continuously and digitally stored.  ? ?Description: The posterior dominant rhythm consists of 7.5 Hz activity of moderate voltage (25-35 uV) seen predominantly in posterior head regions, symmetric and reactive to eye opening and eye closing. Sleep was characterized by vertex waves, sleep spindles (12 to 14 Hz), maximal frontocentral region. EEG showed intermittent generalized 3 to 6 Hz theta-delta slowing. Hyperventilation and photic stimulation were not performed.    ? ?ABNORMALITY ?- Intermittent slow, generalized ? ?IMPRESSION: ?This study is suggestive of mild diffuse encephalopathy, nonspecific etiology. No seizures or epileptiform discharges were seen throughout the recording. ? ?Lora Havens  ? ?

## 2021-12-09 NOTE — ED Notes (Signed)
Patient transported to MRI 

## 2021-12-09 NOTE — ED Notes (Signed)
Pt transported to MRI 

## 2021-12-09 NOTE — Progress Notes (Signed)
OT Cancellation Note ? ?Patient Details ?Name: Tiffany [name] ?MRN: [id number] ?DOB: 1951/02/16 ? ? ?Cancelled Treatment:    Reason Eval/Treat Not Completed: Other (comment) ?Patient is pending transfer to [facility] at this time. OT to continue to follow and check back as schedule will allow.  ?Meloni Hinz OTR/L, MS ?Acute Rehabilitation Department ?Office# [phone] ?Pager# [phone] ? ?12/09/2021, 9:56 AM ?

## 2021-12-10 ENCOUNTER — Inpatient Hospital Stay (HOSPITAL_COMMUNITY): Payer: Medicare (Managed Care)

## 2021-12-10 DIAGNOSIS — G9340 Encephalopathy, unspecified: Secondary | ICD-10-CM | POA: Diagnosis not present

## 2021-12-10 LAB — BASIC METABOLIC PANEL
Anion gap: 15 (ref 5–15)
BUN: 28 mg/dL — ABNORMAL HIGH (ref 8–23)
CO2: 20 mmol/L — ABNORMAL LOW (ref 22–32)
Calcium: 8.4 mg/dL — ABNORMAL LOW (ref 8.9–10.3)
Chloride: 106 mmol/L (ref 98–111)
Creatinine, Ser: 1.56 mg/dL — ABNORMAL HIGH (ref 0.44–1.00)
GFR, Estimated: 36 mL/min — ABNORMAL LOW (ref >60)
Glucose, Bld: 86 mg/dL (ref 70–99)
Potassium: 3.7 mmol/L (ref 3.5–5.1)
Sodium: 141 mmol/L (ref 135–145)

## 2021-12-10 LAB — CBC
HCT: 33.6 % — ABNORMAL LOW (ref 36.0–46.0)
Hemoglobin: 10.7 g/dL — ABNORMAL LOW (ref 12.0–15.0)
MCH: 27.8 pg (ref 26.0–34.0)
MCHC: 31.8 g/dL (ref 30.0–36.0)
MCV: 87.3 fL (ref 80.0–100.0)
Platelets: 221 10*3/uL (ref 150–400)
RBC: 3.85 MIL/uL — ABNORMAL LOW (ref 3.87–5.11)
RDW: 13.4 % (ref 11.5–15.5)
WBC: 5.7 10*3/uL (ref 4.0–10.5)
nRBC: 0 % (ref 0.0–0.2)

## 2021-12-10 LAB — MAGNESIUM: Magnesium: 2 mg/dL (ref 1.7–2.4)

## 2021-12-10 LAB — BRAIN NATRIURETIC PEPTIDE: B Natriuretic Peptide: 12.2 pg/mL (ref 0.0–100.0)

## 2021-12-10 MED ORDER — LACTATED RINGERS IV SOLN: INTRAVENOUS | Status: AC

## 2021-12-10 MED ORDER — ADULT MULTIVITAMIN W/MINERALS CH
1.0000 | ORAL_TABLET | Freq: Every day | ORAL | Status: DC
  Administered 2021-12-10 – 2021-12-13 (×4): 1 via ORAL
  Filled 2021-12-10 (×5): qty 1

## 2021-12-10 MED ORDER — HEPARIN SODIUM (PORCINE) 5000 UNIT/ML IJ SOLN
5000.0000 [IU] | Freq: Three times a day (TID) | INTRAMUSCULAR | Status: DC
  Administered 2021-12-10 – 2021-12-11 (×3): 5000 [IU] via SUBCUTANEOUS
  Filled 2021-12-10 (×3): qty 1

## 2021-12-10 MED ORDER — ROSUVASTATIN CALCIUM 5 MG PO TABS
10.0000 mg | ORAL_TABLET | Freq: Every day | ORAL | Status: DC
  Administered 2021-12-10 – 2021-12-13 (×4): 10 mg via ORAL
  Filled 2021-12-10 (×5): qty 2

## 2021-12-10 NOTE — Progress Notes (Addendum)
STROKE TEAM PROGRESS NOTE  ? ?INTERVAL HISTORY ?Tiffany [name] is a 71 year old female with a PMHx of HTN, DM, PE in November of 2022, and dementia. She presented to the ED for an episode concerning for seizure, with 5 minutes of R hand jerking, followed by decreased responsiveness and gradual improvement over the next hour. Routine EEG study negative for seizure. MRI showed punctate acute white matter infarcts. MRA head and carotid dopplers were unremarkable.  ? ?The patient is seen in her room this morning with no family at bedside. She is alert and oriented x2 and states the year is 93. She is unable to relate what brought her into the hospital but denies present symptoms.  ?This is ? ?Vitals:  ? 12/10/21 0000 12/10/21 0400 12/10/21 0801 12/10/21 1223  ?BP: 107/81 119/70 118/73 117/77  ?Pulse: 73 81 70 64  ?Resp: '16 18 16 16  '$ ?Temp: 98.2 ?F (36.8 ?C) 98.4 ?F (36.9 ?C) 97.6 ?F (36.4 ?C) (!) 97.4 ?F (36.3 ?C)  ?TempSrc: Oral Axillary Axillary Oral  ?SpO2: 100% 97% 100% 100%  ?Weight:      ? ?CBC:  ?Recent Labs  ?Lab 12/08/21 ?1821 12/08/21 ?1843 12/10/21 ?1610  ?WBC 12.2*  --  5.7  ?NEUTROABS 10.5*  --   --   ?HGB 12.5 11.2* 10.7*  ?HCT 39.5 33.0* 33.6*  ?MCV 88.2  --  87.3  ?PLT 285  --  221  ? ?Basic Metabolic Panel:  ?Recent Labs  ?Lab 12/08/21 ?1821 12/08/21 ?1843 12/10/21 ?9604  ?NA 142 139 141  ?K 4.3 3.9 3.7  ?CL 111 105 106  ?CO2 22  --  20*  ?GLUCOSE 167* 155* 86  ?BUN 36* 30* 28*  ?CREATININE 1.86* 2.00* 1.56*  ?CALCIUM 9.4  --  8.4*  ?MG  --   --  2.0  ? ?Lipid Panel:  ?Recent Labs  ?Lab 12/09/21 ?0336  ?CHOL 128  ?TRIG 97  ?HDL 32*  ?CHOLHDL 4.0  ?VLDL 19  ?LDLCALC 77  ? ?HgbA1c:  ?Recent Labs  ?Lab 12/09/21 ?0336  ?HGBA1C 6.4*  ? ?Urine Drug Screen:  ?Recent Labs  ?Lab 12/08/21 ?0015  ?LABOPIA NONE DETECTED  ?COCAINSCRNUR NONE DETECTED  ?LABBENZ NONE DETECTED  ?AMPHETMU NONE DETECTED  ?THCU NONE DETECTED  ?LABBARB NONE DETECTED  ?  ?Alcohol Level  ?Recent Labs  ?Lab 12/08/21 ?1839  ?ETH <10   ? ? ?IMAGING past 24 hours ?DG Swallowing Func-Speech Pathology ? ?Result Date: 12/10/2021 ?Table formatting from the original result was not included. Objective Swallowing Evaluation: Type of Study: MBS-Modified Barium Swallow Study  Patient Details Name: Tiffany [name] MRN: [id number] Date of Birth: [birth year] Today's Date: 12/10/2021 Time: SLP Start Time (ACUTE ONLY): 1010 -SLP Stop Time (ACUTE ONLY): 1025 SLP Time Calculation (min) (ACUTE ONLY): 15 min Past Medical History: Past Medical History: Diagnosis Date  Arthritis   osteoarthritis  GERD (gastroesophageal reflux disease)   Hx of bronchitis   Hypertension  Past Surgical History: Past Surgical History: Procedure Laterality Date  ABDOMINAL AORTOGRAM W/LOWER EXTREMITY N/A 02/08/2021  Procedure: ABDOMINAL AORTOGRAM W/LOWER EXTREMITY;  Surgeon: Elam Dutch, MD;  Location: [facility];  Service: Cardiovascular;  Laterality: N/A;  AMPUTATION Left 02/13/2021  Procedure: FIFTH TOE AMPUTATION;  Surgeon: [name], MD;  Location: [facility] OR;  Service: Vascular;  Laterality: Left;  FEMORAL-POPLITEAL BYPASS GRAFT Left 02/13/2021  Procedure: BYPASS GRAFT LEFT FEMORAL-POPLITEAL ARTERY;  Surgeon: [name], MD;  Location: [facility];  Service: Vascular;  Laterality: Left;  KNEE SURGERY    arthroscopic right  toe nail removal    TOTAL KNEE ARTHROPLASTY Left 05/31/2013  Procedure: LEFT TOTAL KNEE ARTHROPLASTY;  Surgeon: Mauri Pole, MD;  Location: [facility];  Service: Orthopedics;  Laterality: Left;  VEIN HARVEST Left 02/13/2021  Procedure: VEIN HARVEST LEFT GREATER SAPHENOUS VEIN;  Surgeon: [name], MD;  Location: [facility] OR;  Service: Vascular;  Laterality: Left; HPI: Patient is a 71 y.o. female with PMH: HTN, dementia GERD, arthritis who presented to the hospital with episode concerning for partial seizure. She had jerking of right hand that lasted approximately 5 minutes followed by decreased responsiveness  which gradually improved over the course of the  hour. MRI showed Punctate acute to subacute right central white matter infarct.  Subjective: awake but saying she is tired, initially refusing to swallow any barium  Recommendations for follow up therapy are one component of a multi-disciplinary discharge planning process, led by the attending physician.  Recommendations may be updated based on patient status, additional functional criteria and insurance authorization. Assessment / Plan / Recommendation   12/10/2021  10:48 AM Clinical Impressions Clinical Impression Patient presenting with a primary oral phase dysphagia but with pharyngeal phase WFL with only one instance of swallow initiation delay at level of vallecular sinus with mechanical soft solids. During oral phase, patient exhibited brief holding of liquids but with good oral containment and no premature spillage of liquids into pharynx. With mechanical soft solids, patient exhibited prolonged mastication and delayed oral transit but with full clearance of bolus after initial swallows. No instances of penetration or aspiration observed. At this time, SLP is not recommending further interventions as she appears to be on the least restrictive diet of Dys 3 (mechanical soft) solids, thin liquids due to her lack of full dentition and oral phase delays (suspect cognitive-based due to dementia). SLP Visit Diagnosis Dysphagia, oropharyngeal phase (R13.12) Impact on safety and function No limitations     12/10/2021  10:48 AM Treatment Recommendations Treatment Recommendations No treatment recommended at this time     12/09/2021  12:09 PM Prognosis Prognosis for Safe Diet Advancement Good   12/10/2021  10:48 AM Diet Recommendations SLP Diet Recommendations Dysphagia 3 (Mech soft) solids;Thin liquid Liquid Administration via Straw;Cup Medication Administration Whole meds with liquid Compensations Slow rate;Small sips/bites;Minimize environmental distractions Postural Changes Seated upright at 90 degrees     12/10/2021   10:48 AM Other Recommendations Oral Care Recommendations Oral care BID Follow Up Recommendations No SLP follow up Assistance recommended at discharge Frequent or constant Supervision/Assistance Functional Status Assessment Patient has had a recent decline in their functional status and demonstrates the ability to make significant improvements in function in a reasonable and predictable amount of time.   12/09/2021  12:09 PM Frequency and Duration  Speech Therapy Frequency (ACUTE ONLY) min 2x/week Treatment Duration 1 week     12/10/2021  10:46 AM Oral Phase Oral Phase Impaired Oral - Thin Straw Holding of bolus;Reduced posterior propulsion Oral - Mech Soft Reduced posterior propulsion;Impaired mastication;Delayed oral transit Oral - Pill Reduced posterior propulsion;Impaired mastication    12/10/2021  10:47 AM Pharyngeal Phase Pharyngeal Phase Impaired Pharyngeal- Thin Straw WFL Pharyngeal- Mechanical Soft Delayed swallow initiation-vallecula Pharyngeal- Pill [facility]    12/10/2021  10:48 AM Cervical Esophageal Phase  Cervical Esophageal Phase WFL Sonia Baller, MA, CCC-SLP Speech Therapy                    ? ?EEG adult ? ?Result Date:  12/09/2021 ?Lora Havens, MD     12/09/2021  5:38 PM Patient Name: Tiffany [name] MRN: [id number] Epilepsy Attending: Lora Havens Referring Physician/Provider: Clance Boll, MD Date: 12/09/2021 Duration: 27.13 mins Patient history: 71 year old female with a history of dementia who presents with episode of right arm shaking followed by postictal confusion. EEG to evaluate for seizure. Level of alertness: Awake, asleep AEDs during EEG study: LEV Technical aspects: This EEG study was done with scalp electrodes positioned according to the 10-20 International system of electrode placement. Electrical activity was acquired at a sampling rate of '500Hz'$  and reviewed with a high frequency filter of '70Hz'$  and a low frequency filter of '1Hz'$ . EEG data were recorded continuously and digitally  stored. Description: The posterior dominant rhythm consists of 7.5 Hz activity of moderate voltage (25-35 uV) seen predominantly in posterior head regions, symmetric and reactive to eye opening and eye closing

## 2021-12-10 NOTE — Progress Notes (Signed)
Modified Barium Swallow Progress Note ? ?Patient Details  ?Name: ADEL NEYER ?MRN: [id number] ?Date of Birth: 03-17-1951 ? ?Today's Date: 12/10/2021 ? ?Modified Barium Swallow completed.  Full report located under Chart Review in the Imaging Section. ? ?Brief recommendations include the following: ? ?Clinical Impression ? Patient presenting with a primary oral phase dysphagia but with pharyngeal phase WFL with only one instance of swallow initiation delay at level of vallecular sinus with mechanical soft solids. During oral phase, patient exhibited brief holding of liquids but with good oral containment and no premature spillage of liquids into pharynx. With mechanical soft solids, patient exhibited prolonged mastication and delayed oral transit but with full clearance of bolus after initial swallows. No instances of penetration or aspiration observed. At this time, SLP is not recommending further interventions as she appears to be on the least restrictive diet of Dys 3 (mechanical soft) solids, thin liquids due to her lack of full dentition and oral phase delays (suspect cognitive-based due to dementia). ?  ?Swallow Evaluation Recommendations ? ?   ? ? SLP Diet Recommendations: Dysphagia 3 (Mech soft) solids;Thin liquid ? ? Liquid Administration via: Straw;Cup ? ? Medication Administration: Whole meds with liquid ? ? Supervision: Patient able to self feed ? ? Compensations: Slow rate;Small sips/bites;Minimize environmental distractions ? ? Postural Changes: Seated upright at 90 degrees ? ? Oral Care Recommendations: Oral care BID ? ?   ? ? ?Sonia Baller, MA, CCC-SLP ?Speech Therapy ? ?

## 2021-12-10 NOTE — Evaluation (Signed)
Occupational Therapy Evaluation Patient Details Name: Tiffany [name] MRN: [id number] DOB: 03/19/1951 Today's Date: 12/10/2021   History of Present Illness Tiffany [name] is a 71 y.o. female with a history of hypertension and dementia who presents with an episode concerning for partial seizure.  She had jerking of her right hand that lasted for approximately 5 minutes followed by decreased responsiveness.  This gradually improved back to baseline over the course of an hour. She also had a staring spell without jerking about two weeks ago .MRI brain done on 12/10/2018 revealed: Punctate acute to subacute right central white matter infarct. Stable findings of advanced chronic microvascular ischemic changes and numerous hypertensive chronic microhemorrhages   Clinical Impression   Pt currently needs max assist for transfer to the EOB with mod assist for simulated transfers using the RW for support and overall mod to max assist for simulated dressing tasks.  Unsure of her baseline as no family present, but according to the chart, she needed assist for selfcare tasks and feeding.  Feel she will need 24 hr assist at discharge but per pt she lives with her son, who works.  Unsure if any other family are available to provide 24 hr assist.  Would recommend SNF for follow-up rehab post acute based on current level and discharge situation.       Recommendations for follow up therapy are one component of a multi-disciplinary discharge planning process, led by the attending physician.  Recommendations may be updated based on patient status, additional functional criteria and insurance authorization.   Follow Up Recommendations  Skilled nursing-short term rehab (<3 hours/day)    Assistance Recommended at Discharge Frequent or constant Supervision/Assistance  Patient can return home with the following A lot of help with walking and/or transfers;A lot of help with bathing/dressing/bathroom;Assistance with  feeding;Direct supervision/assist for medications management;Direct supervision/assist for financial management;Help with stairs or ramp for entrance;Assistance with cooking/housework    Functional Status Assessment  Patient has had a recent decline in their functional status and demonstrates the ability to make significant improvements in function in a reasonable and predictable amount of time.  Equipment Recommendations  BSC/3in1       Precautions / Restrictions Precautions Precautions: Fall Restrictions Weight Bearing Restrictions: No      Mobility Bed Mobility Overal bed mobility: Needs Assistance Bed Mobility: Rolling, Supine to Sit, Sit to Supine Rolling: Total assist (for partial roll to the left)   Supine to sit: Max assist Sit to supine: Mod assist   General bed mobility comments: Pt needed assist with all aspects for both supine to sit and sit to supine.    Transfers Overall transfer level: Needs assistance Equipment used: Rolling walker (2 wheels) Transfers: Sit to/from Stand Sit to Stand: Mod assist                  Balance Overall balance assessment: Needs assistance   Sitting balance-Leahy Scale: Fair     Standing balance support: Reliant on assistive device for balance Standing balance-Leahy Scale: Poor Standing balance comment: Reliant on therapist assist in addition to BUE support.                           ADL either performed or assessed with clinical judgement   ADL Overall ADL's : Needs assistance/impaired Eating/Feeding: Minimal assistance;Sitting   Grooming: Minimal assistance;Sitting               Lower Body Dressing: Sit to/from  stand;Total assistance Lower Body Dressing Details (indicate cue type and reason): total assist for gripper socks Toilet Transfer: Stand-pivot;Moderate assistance Toilet Transfer Details (indicate cue type and reason): simulated with use of the RW.  Pt declined need to use the toilet          Functional mobility during ADLs: Moderate assistance (To take 2-3 steps up toward the [facility] with the RW.) General ADL Comments: Unsure of pt's PLOF secondary to increased agitation the more questions therapist asked as well as her history of dementia.  Per her report, she lived with her son who works during the day, leaving her alone.  Based on current level of mod to max assist feel she would not be safe to return to this situation, unless 24 hr assist is available.     Vision Baseline Vision/History: 1 Wears glasses (she states not all the time) Ability to See in Adequate Light: 0 Adequate Patient Visual Report: No change from baseline Vision Assessment?: No apparent visual deficits (Will assess in treatment as pt is less agitated.)     Perception     Praxis Praxis Praxis: Impaired Praxis Impairment Details: Initiation Praxis-Other Comments: Could be more related to attention vs apraxia, will examine further in treatment.    Pertinent Vitals/Pain Pain Assessment Pain Assessment: Faces Faces Pain Scale: Hurts little more Pain Location: back pain once sitting EOB Pain Descriptors / Indicators: Aching, Discomfort Pain Intervention(s): Monitored during session, Repositioned     Hand Dominance     Extremity/Trunk Assessment Upper Extremity Assessment Upper Extremity Assessment: Generalized weakness (bilateral shoulder flexion 0-100 degrees with strength in grip at 4/5 on both the right and left.  Pt reports no numbness in either hand.)   Lower Extremity Assessment Lower Extremity Assessment: Defer to PT evaluation   Cervical / Trunk Assessment Cervical / Trunk Assessment: Normal   Communication     Cognition Arousal/Alertness: Awake/alert Behavior During Therapy: Agitated (Became slightly agitated with therapist's questions) Overall Cognitive Status: No family/caregiver present to determine baseline cognitive functioning (history of dementia)                                                   Home Living Family/patient expects to be discharged to:: Private residence Living Arrangements: Children (lives with son) Available Help at Discharge: Family (unsure of she has 24 hr supervision)               Bathroom Shower/Tub: Chief Strategy Officer: Standard     Home Equipment: Agricultural consultant (2 wheels);Tub bench   Additional Comments: Pt reports having the items above, but would need to be confirmed with family.  Unsure of her current level of function PTA.  She reported that she lived with her son and he is gone to work during the day.  She said she would sleep most of the day but would get up to go to the bathroom.      Prior Functioning/Environment Prior Level of Function : Needs assist  Cognitive Assist : ADLs (cognitive)   ADLs (Cognitive): Intermittent cues (per chart) Physical Assist : ADLs (physical)   ADLs (physical): Bathing;Dressing;Toileting (per chart)            OT Problem List: Decreased strength      OT Treatment/Interventions: Self-care/ADL training;Balance training;DME and/or AE instruction;Therapeutic activities;Patient/family education;Neuromuscular education;Cognitive remediation/compensation  OT Goals(Current goals can be found in the care plan section) Acute Rehab OT Goals Patient Stated Goal: Pt wants to go home OT Goal Formulation: Patient unable to participate in goal setting Time For Goal Achievement: 12/24/21 Potential to Achieve Goals: Good  OT Frequency: Min 2X/week       AM-PAC OT "6 Clicks" Daily Activity     Outcome Measure Help from another person eating meals?: A Little Help from another person taking care of personal grooming?: A Little Help from another person toileting, which includes using toliet, bedpan, or urinal?: A Lot Help from another person bathing (including washing, rinsing, drying)?: A Lot Help from another person to put on and taking off regular upper  body clothing?: A Little Help from another person to put on and taking off regular lower body clothing?: A Lot 6 Click Score: 15   End of Session Equipment Utilized During Treatment: Rolling walker (2 wheels) Nurse Communication: Mobility status  Activity Tolerance: Patient limited by fatigue;Treatment limited secondary to agitation Patient left: in bed;with call bell/phone within reach;with bed alarm set  OT Visit Diagnosis: Unsteadiness on feet (R26.81);Muscle weakness (generalized) (M62.81);Other symptoms and signs involving cognitive function;Pain Pain - Right/Left:  (lower back) Pain - part of body:  (lower back)                Time: 9604-5409 OT Time Calculation (min): 31 min Charges:  OT General Charges $OT Visit: 1 Visit OT Evaluation $OT Eval Moderate Complexity: 1 Mod OT Treatments $Self Care/Home Management : 8-22 mins  Annaka Cleaver OTR/L 12/10/2021, 9:44 AM

## 2021-12-10 NOTE — Plan of Care (Signed)
?  Problem: Education: ?Goal: Knowledge of General Education information will improve ?Description: Including pain rating scale, medication(s)/side effects and non-pharmacologic comfort measures ?Outcome: Progressing ?  ?Problem: Health Behavior/Discharge Planning: ?Goal: Ability to manage health-related needs will improve ?Outcome: Progressing ?  ?Problem: Clinical Measurements: ?Goal: Ability to maintain clinical measurements within normal limits will improve ?Outcome: Progressing ?Goal: Will remain free from infection ?Outcome: Progressing ?Goal: Diagnostic test results will improve ?Outcome: Progressing ?Goal: Respiratory complications will improve ?Outcome: Progressing ?Goal: Cardiovascular complication will be avoided ?Outcome: Progressing ?  ?Problem: Activity: ?Goal: Risk for activity intolerance will decrease ?Outcome: Progressing ?  ?Problem: Nutrition: ?Goal: Adequate nutrition will be maintained ?Outcome: Progressing ?  ?Problem: Coping: ?Goal: Level of anxiety will decrease ?Outcome: Progressing ?  ?Problem: Elimination: ?Goal: Will not experience complications related to bowel motility ?Outcome: Progressing ?Goal: Will not experience complications related to urinary retention ?Outcome: Progressing ?  ?Problem: Pain Managment: ?Goal: General experience of comfort will improve ?Outcome: Progressing ?  ?Problem: Safety: ?Goal: Ability to remain free from injury will improve ?Outcome: Progressing ?  ?Problem: Skin Integrity: ?Goal: Risk for impaired skin integrity will decrease ?Outcome: Progressing ?  ?Problem: Education: ?Goal: Knowledge of disease or condition will improve ?Outcome: Progressing ?Goal: Knowledge of secondary prevention will improve (SELECT ALL) ?Outcome: Progressing ?  ?Problem: Health Behavior/Discharge Planning: ?Goal: Ability to manage health-related needs will improve ?Outcome: Progressing ?  ?Problem: Nutrition: ?Goal: Risk of aspiration will decrease ?Outcome: Progressing ?  ?

## 2021-12-10 NOTE — Progress Notes (Signed)
?                                  PROGRESS NOTE                                             ?                                                                                                                     ?                                         ? ? Patient Demographics:  ? ? Tiffany [name], is a 71 y.o. female, DOB - Feb 07, 1951, MCN:[id number] ? ?Outpatient Primary MD for the patient is Trey Sailors, [place]    LOS - 2  Admit date - 12/08/2021   ? ?Chief Complaint  ?Patient presents with  ? Altered Mental Status  ?    ? ?Brief Narrative (HPI from H&P)  - 71 y.o. female with medical history significant of hypertension, diabetes, dementia ,and obesity who presents to ED BIB son due to tremors in right hand x 5 minutes followed by altered state described as decrease responsiveness from baseline.  ? ? Subjective:  ? ? [facility] today has, No headache, No chest pain, No abdominal pain - No Nausea, No new weakness tingling or numbness, no SOB. ? ? Assessment  & Plan :  ? ?Acute metabolic encephalopathy secondary to acute CVA, seen on MRI brain. 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 Stable findings of advanced chronic microvascular ischemic changes and numerous hypertensive chronic microhemorrhages.  She is currently on aspirin and statin for secondary prevention, LDL was above goal A1c was stable.  Stable echocardiogram.  CTA head and neck noted along with carotid ultrasound.  Full stroke work-up.  Will wait for stroke team to provide further guidance on any changes with treatment ? ? ?Questionable partial seizure ?Seen by neurology, management per neurology. ?Seizures precaution.  Placed on Keppra continue.  EEG was nonacute. ?  ?AKI on CKD 3B, suspect prerenal in setting of poor oral intake. ?Baseline creatinine appears to be 1.5 with GFR 36, gentle hydration and monitor. ?  ?Essential hypertension ?BP not at goal,  elevated.  Allow for permissive hypertension due to stroke. ?  ?Presented with UTI ?She is on Rocephin x 3. ? ?Prediabetes with hyperglycemia ? ?Lab Results  ?Component Value Date  ? HGBA1C 6.4 (H) 12/09/2021  ? ? ?CBG (last 3)  ?Recent Labs  ?  12/08/21 ?1835  ?GLUCAP 148*  ? ? ? ?  ? ?   ? ?Condition - Fair ? ?Family  Communication  :  son Marta Antu [phone] on 12/10/21 ? ?Code Status : Full ? ?Consults  :  Neuro ? ?PUD Prophylaxis :  ? ? Procedures  :    ? ?EEG - This study is suggestive of mild diffuse encephalopathy, nonspecific etiology. No seizures or epileptiform discharges were seen throughout the recording. ? ?MRI brain.  Punctate acute to subacute right central white matter infarct. Stable findings of advanced chronic microvascular ischemic changes and numerous hypertensive chronic microhemorrhages. ? ?Echocardiogram.  1. Left ventricular ejection fraction, by estimation, is 65 to 70%. The left ventricle has normal function. The left ventricle has no regional wall motion abnormalities. Left ventricular diastolic parameters are consistent with Grade I diastolic dysfunction (impaired relaxation).  2. Right ventricular systolic function is normal. The right ventricular size is normal. Tricuspid regurgitation signal is inadequate for assessing PA pressure.  3. The mitral valve is normal in structure. No evidence of mitral valve regurgitation. No evidence of mitral stenosis.  4. The aortic valve was not well visualized. Aortic valve regurgitation is not visualized. No aortic stenosis is present.  5. Technically difficult study with very poor windows.  ? ?Vascular carotid ultrasound. Right Carotid: The extracranial vessels were near-normal with only minimal wall                thickening or plaque. Left Carotid: The extracranial vessels were near-normal with only minimal wall thickening or plaque. Vertebrals:  Left vertebral artery demonstrates antegrade flow. Right vertebral  artery was not visualized.  Subclavians: Normal flow hemodynamics were seen in bilateral subclavian ? ?CT head - nonacute.  ? ?CTA head and neck- 1. No emergent large vessel occlusion. 2. Approximately 50% stenosis of the proximal left internal carotid artery relative to the more distal vessel. 3. Mild tortuosity of the cervical internal carotid arteries bilaterally without significant stenosis. This is nonspecific, but most commonly seen in the setting of hypertension. 4. Atherosclerotic changes within the cavernous pre cavernous internal carotid arteries bilaterally without significant stenosis. 5. Otherwise unremarkable CTA Circle of Willis without significant proximal stenosis, aneurysm, or branch vessel occlusion. 6. Aortic Atherosclerosis ? ?   ? ?Disposition Plan  :   ? ?Status is: Inpatient ? ?DVT Prophylaxis  :  Heparin added ? ?SCD's Start: 12/08/21 2041 ? ?Lab Results  ?Component Value Date  ? PLT 221 12/10/2021  ? ? ?Diet :  ?Diet Order   ? ?       ?  DIET DYS 3 Room service appropriate? Yes; Fluid consistency: Thin  Diet effective now       ?  ? ?  ?  ? ?  ?  ? ?Inpatient Medications ? ?Scheduled Meds: ?  stroke: early stages of recovery book   Does not apply Once  ? aspirin  300 mg Rectal Daily  ? Or  ? aspirin  81 mg Oral Daily  ? feeding supplement  237 mL Oral BID BM  ? pneumococcal 20-valent conjugate vaccine  0.5 mL Intramuscular Tomorrow-1000  ? rosuvastatin  10 mg Oral Daily  ? ?Continuous Infusions: ? cefTRIAXone (ROCEPHIN)  IV 1 g (12/10/21 0127)  ? lactated ringers 100 mL/hr at 12/10/21 0848  ? levETIRAcetam 250 mg (12/10/21 0339)  ? ?PRN Meds:.acetaminophen **OR** acetaminophen (TYLENOL) oral liquid 160 mg/5 mL **OR** acetaminophen, albuterol, senna-docusate ? ?Antibiotics  :   ? ?Anti-infectives (From admission, onward)  ? ? Start     Dose/Rate Route Frequency Ordered Stop  ? 12/09/21 0215  cefTRIAXone (ROCEPHIN) 1 g  in sodium chloride 0.9 % 100 mL IVPB       ? 1 g ?200 mL/hr over 30 Minutes Intravenous Every 24 hours  12/09/21 0200    ? ?  ? ? ? Time Spent in minutes  30 ? ? ?Lala Lund M.D on 12/10/2021 at 10:36 AM ? ?To page go to [email or web address]  ? ?[facility] -  Office  [phone] ? ?See all Orders from today for further details ? ? ? Objective:  ? ?Vitals:  ? 12/09/21 2050 12/10/21 0000 12/10/21 0400 12/10/21 0801  ?BP: 122/76 107/81 119/70 118/73  ?Pulse: 71 73 81 70  ?Resp: '17 16 18 16  '$ ?Temp: 98.1 ?F (36.7 ?C) 98.2 ?F (36.8 ?C) 98.4 ?F (36.9 ?C) 97.6 ?F (36.4 ?C)  ?TempSrc: Oral Oral Axillary Axillary  ?SpO2: 96% 100% 97% 100%  ?Weight:      ? ? ?Wt Readings from Last 3 Encounters:  ?12/08/21 98.9 kg  ?07/27/21 99 kg  ?07/22/21 99.8 kg  ? ? ?No intake or output data in the 24 hours ending 12/10/21 1036 ? ? ?Physical Exam ? ?Awake Alert, No new F.N deficits, Normal affect ?[place].AT,PERRAL ?Supple Neck, No JVD,   ?Symmetrical Chest wall movement, Good air movement bilaterally, CTAB ?RRR,No Gallops,Rubs or new Murmurs,  ?+ve B.Sounds, Abd Soft, No tenderness,   ?No Cyanosis, Clubbing or edema  ?  ? ?RN pressure injury documentation: ?Pressure Injury 12/09/21 Sacrum Mid Stage 1 -  Intact skin with non-blanchable redness of a localized area usually over a bony prominence. pink (Active)  ?12/09/21 1831  ?Location: Sacrum  ?Location Orientation: Mid  ?Staging: Stage 1 -  Intact skin with non-blanchable redness of a localized area usually over a bony prominence.  ?Wound Description (Comments): pink  ?Present on Admission: Yes  ? ? ? Data Review:  ? ? ?CBC ?Recent Labs  ?Lab 12/08/21 ?1821 12/08/21 ?1843 12/10/21 ?4580  ?WBC 12.2*  --  5.7  ?HGB 12.5 11.2* 10.7*  ?HCT 39.5 33.0* 33.6*  ?PLT 285  --  221  ?MCV 88.2  --  87.3  ?MCH 27.9  --  27.8  ?MCHC 31.6  --  31.8  ?RDW 13.2  --  13.4  ?LYMPHSABS 1.0  --   --   ?MONOABS 0.6  --   --   ?EOSABS 0.1  --   --   ?BASOSABS 0.0  --   --   ? ? ?Electrolytes ?Recent Labs  ?Lab 12/08/21 ?1821 12/08/21 ?1843 12/08/21 ?1957 12/09/21 ?9983 12/09/21 ?3825 12/10/21 ?0539  ?NA 142 139   --   --   --  141  ?K 4.3 3.9  --   --   --  3.7  ?CL 111 105  --   --   --  106  ?CO2 22  --   --   --   --  20*  ?GLUCOSE 167* 155*  --   --   --  86  ?BUN 36* 30*  --   --   --  28*  ?CREATININE 1.86* 2

## 2021-12-10 NOTE — Progress Notes (Signed)
Initial Nutrition Assessment ? ?DOCUMENTATION CODES:  ? ?Not applicable ? ?INTERVENTION:  ? ?Meal ordering with assist ?Multivitamin w/ minerals daily ?Ensure Enlive po BID, each supplement provides 350 kcal and 20 grams of protein. ? ?NUTRITION DIAGNOSIS:  ? ?Increased nutrient needs related to acute illness, wound healing as evidenced by estimated needs. ? ?GOAL:  ? ?Patient will meet greater than or equal to 90% of their needs ? ?MONITOR:  ? ?PO intake, Supplement acceptance, Labs, Weight trends ? ?REASON FOR ASSESSMENT:  ? ?Malnutrition Screening Tool ?  ? ?ASSESSMENT:  ? ?71 y.o. female presented to [facility] ED with tremors x 5 days and decreased responsiveness. PMH includes GERD, HTN, and DM. Pt admitted with encephalopathy 2/2 CVA, UTI, and AKI.  ? ?Pt sleeping in room, did not wake to RD voice or touch.  ? ?Unable to obtain any nutrition related history. No family present at bedside.  ? ?Suspect current weight is stated versus actual.  ? ?Medications reviewed and include: IV antibiotics   ?Labs reviewed: BUN 28, Creatinine 1.56, Hgb A1c 6.4% ? ?NUTRITION - FOCUSED PHYSICAL EXAM: ? ?Flowsheet Row Most Recent Value  ?Orbital Region No depletion  ?Upper Arm Region No depletion  ?Thoracic and Lumbar Region No depletion  ?Buccal Region No depletion  ?Temple Region No depletion  ?Clavicle Bone Region No depletion  ?Clavicle and Acromion Bone Region No depletion  ?Scapular Bone Region No depletion  ?Dorsal Hand No depletion  ?Patellar Region No depletion  ?Anterior Thigh Region No depletion  ?Posterior Calf Region No depletion  ?Edema (RD Assessment) None  ?Hair Reviewed  ?Eyes Unable to assess  ?Mouth Unable to assess  ?Skin Reviewed  ?Nails Reviewed  ? ?Diet Order:   ?Diet Order   ? ?       ?  DIET DYS 3 Room service appropriate? Yes; Fluid consistency: Thin  Diet effective now       ?  ? ?  ?  ? ?  ? ? ?EDUCATION NEEDS:  ? ?Not appropriate for education at this time ? ?Skin:  Skin Assessment: Skin Integrity  Issues: ?Skin Integrity Issues:: Stage I ?Stage I: Sacrum ? ?Last BM:  4/16 ? ?Height:  ? ?Ht Readings from Last 1 Encounters:  ?07/27/21 '5\' 2"'$  (1.575 m)  ? ? ?Weight:  ? ?Wt Readings from Last 1 Encounters:  ?12/08/21 98.9 kg  ? ? ?Ideal Body Weight:  50 kg ? ?BMI:  Body mass index is 39.87 kg/m?. ? ?Estimated Nutritional Needs:  ? ?Kcal:  1700-1900 ? ?Protein:  85-100 grams ? ?Fluid:  >/= 1.7 L ? ? ? ?Hermina Barters RD, LDN ?Clinical Dietitian ?See AMiON for contact information.  ? ?

## 2021-12-10 NOTE — Progress Notes (Signed)
PT Cancellation Note ? ?Patient Details ?Name: KALIOPE QUINONEZ ?MRN: [id number] ?DOB: [birth year] ? ? ?Cancelled Treatment:    Reason Eval/Treat Not Completed: Fatigue/lethargy limiting ability to participate ? ?Patient sleeping soundly. Briefly awakens to sternal rub and then goes to sleep--unable to stay awake and follow any commands. Will reattempt evaluation as appropriate.  ? ? ?Arby Barrette, PT ?Acute Rehabilitation Services  ?Pager [phone] ?Office [phone] ? ? ?Jeanie Cooks Tabytha Gradillas ?12/10/2021, 1:13 PM ?

## 2021-12-11 ENCOUNTER — Inpatient Hospital Stay (HOSPITAL_COMMUNITY): Payer: Medicare (Managed Care)

## 2021-12-11 DIAGNOSIS — I2699 Other pulmonary embolism without acute cor pulmonale: Secondary | ICD-10-CM

## 2021-12-11 DIAGNOSIS — R569 Unspecified convulsions: Secondary | ICD-10-CM

## 2021-12-11 DIAGNOSIS — L8990 Pressure ulcer of unspecified site, unspecified stage: Secondary | ICD-10-CM | POA: Insufficient documentation

## 2021-12-11 DIAGNOSIS — Z86711 Personal history of pulmonary embolism: Secondary | ICD-10-CM

## 2021-12-11 DIAGNOSIS — I6330 Cerebral infarction due to thrombosis of unspecified cerebral artery: Secondary | ICD-10-CM | POA: Insufficient documentation

## 2021-12-11 DIAGNOSIS — G9340 Encephalopathy, unspecified: Secondary | ICD-10-CM | POA: Diagnosis not present

## 2021-12-11 LAB — COMPREHENSIVE METABOLIC PANEL
ALT: 12 U/L (ref 0–44)
AST: 16 U/L (ref 15–41)
Albumin: 3 g/dL — ABNORMAL LOW (ref 3.5–5.0)
Alkaline Phosphatase: 61 U/L (ref 38–126)
Anion gap: 10 (ref 5–15)
BUN: 31 mg/dL — ABNORMAL HIGH (ref 8–23)
CO2: 19 mmol/L — ABNORMAL LOW (ref 22–32)
Calcium: 8.4 mg/dL — ABNORMAL LOW (ref 8.9–10.3)
Chloride: 108 mmol/L (ref 98–111)
Creatinine, Ser: 1.63 mg/dL — ABNORMAL HIGH (ref 0.44–1.00)
GFR, Estimated: 34 mL/min — ABNORMAL LOW (ref >60)
Glucose, Bld: 79 mg/dL (ref 70–99)
Potassium: 3.8 mmol/L (ref 3.5–5.1)
Sodium: 137 mmol/L (ref 135–145)
Total Bilirubin: 0.5 mg/dL (ref 0.3–1.2)
Total Protein: 6.9 g/dL (ref 6.5–8.1)

## 2021-12-11 LAB — MAGNESIUM: Magnesium: 2 mg/dL (ref 1.7–2.4)

## 2021-12-11 LAB — URINE DRUGS OF ABUSE SCREEN W ALC, ROUTINE (REF LAB)
Amphetamines, Urine: NEGATIVE ng/mL
Barbiturate, Ur: NEGATIVE ng/mL
Benzodiazepine Quant, Ur: NEGATIVE ng/mL
Cannabinoid Quant, Ur: NEGATIVE ng/mL
Cocaine (Metab.): NEGATIVE ng/mL
Ethanol U, Quan: NEGATIVE %
Methadone Screen, Urine: NEGATIVE ng/mL
Opiate Quant, Ur: NEGATIVE ng/mL
Phencyclidine, Ur: NEGATIVE ng/mL
Propoxyphene, Urine: NEGATIVE ng/mL

## 2021-12-11 LAB — CBC WITH DIFFERENTIAL/PLATELET
Abs Immature Granulocytes: 0.02 10*3/uL (ref 0.00–0.07)
Basophils Absolute: 0 10*3/uL (ref 0.0–0.1)
Basophils Relative: 1 %
Eosinophils Absolute: 0.2 10*3/uL (ref 0.0–0.5)
Eosinophils Relative: 3 %
HCT: 33.2 % — ABNORMAL LOW (ref 36.0–46.0)
Hemoglobin: 10.5 g/dL — ABNORMAL LOW (ref 12.0–15.0)
Immature Granulocytes: 0 %
Lymphocytes Relative: 23 %
Lymphs Abs: 1.3 10*3/uL (ref 0.7–4.0)
MCH: 28 pg (ref 26.0–34.0)
MCHC: 31.6 g/dL (ref 30.0–36.0)
MCV: 88.5 fL (ref 80.0–100.0)
Monocytes Absolute: 0.4 10*3/uL (ref 0.1–1.0)
Monocytes Relative: 7 %
Neutro Abs: 3.9 10*3/uL (ref 1.7–7.7)
Neutrophils Relative %: 66 %
Platelets: 205 10*3/uL (ref 150–400)
RBC: 3.75 MIL/uL — ABNORMAL LOW (ref 3.87–5.11)
RDW: 13.4 % (ref 11.5–15.5)
WBC: 5.8 10*3/uL (ref 4.0–10.5)
nRBC: 0 % (ref 0.0–0.2)

## 2021-12-11 LAB — URINE CULTURE: Culture: >=100000 — AB

## 2021-12-11 LAB — BRAIN NATRIURETIC PEPTIDE: B Natriuretic Peptide: 31.8 pg/mL (ref 0.0–100.0)

## 2021-12-11 MED ORDER — APIXABAN 5 MG PO TABS
5.0000 mg | ORAL_TABLET | Freq: Two times a day (BID) | ORAL | Status: DC
  Administered 2021-12-11 – 2021-12-13 (×5): 5 mg via ORAL
  Filled 2021-12-11 (×6): qty 1

## 2021-12-11 MED ORDER — LEVETIRACETAM 500 MG PO TABS
500.0000 mg | ORAL_TABLET | Freq: Two times a day (BID) | ORAL | Status: DC
  Administered 2021-12-11 – 2021-12-13 (×5): 500 mg via ORAL
  Filled 2021-12-11 (×6): qty 1

## 2021-12-11 NOTE — Evaluation (Signed)
Physical Therapy Evaluation ?Patient Details ?Name: Tiffany [name] ?MRN: [id number] ?DOB: Jan 16, 1951 ?Today's Date: 12/11/2021 ? ?History of Present Illness ? 71 y/o female presented to ED on 12/08/21 for unresponsiveness and R hand tremors. 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 PMH: dementia, HTN, diabetes  ?Clinical Impression ? Patient admitted with the above. Patient presents with generalized weakness, decreased activity tolerance, and impaired balance. Patient with hx of dementia but following commands appropriately. Patient required maxA for bed mobility and modA to stand from EOB. Unable to take steps at this time. Patient not reliable historian so unsure of PLOF or home setup. Patient will benefit from skilled PT services during acute stay to address listed deficits. Recommend SNF at this time to maximize functional mobility and safety. If family able to provide 24/7 assistance/supervision, patient may be able to discharge home with HHPT.    ?   ? ?Recommendations for follow up therapy are one component of a multi-disciplinary discharge planning process, led by the attending physician.  Recommendations may be updated based on patient status, additional functional criteria and insurance authorization. ? ?Follow Up Recommendations Skilled nursing-short term rehab (<3 hours/day) ? ?  ?Assistance Recommended at Discharge Frequent or constant Supervision/Assistance  ?Patient can return home with the following ? A lot of help with walking and/or transfers;A lot of help with bathing/dressing/bathroom;Assistance with cooking/housework;Direct supervision/assist for medications management;Direct supervision/assist for financial management;Assist for transportation;Help with stairs or ramp for entrance ? ?  ?Equipment Recommendations Other (comment) (TBD)  ?Recommendations for Other Services ?    ?  ?Functional Status Assessment Patient has had a recent decline in their functional status  and/or demonstrates limited ability to make significant improvements in function in a reasonable and predictable amount of time  ? ?  ?Precautions / Restrictions Precautions ?Precautions: Fall ?Restrictions ?Weight Bearing Restrictions: No  ? ?  ? ?Mobility ? Bed Mobility ?Overal bed mobility: Needs Assistance ?Bed Mobility: Supine to Sit, Sit to Supine ?  ?  ?Supine to sit: Max assist ?Sit to supine: Max assist ?  ?General bed mobility comments: maxA required for LE and trunk management to achieve EOB ?  ? ?Transfers ?Overall transfer level: Needs assistance ?Equipment used: 1 person hand held assist (face to face) ?Transfers: Sit to/from Stand ?Sit to Stand: Mod assist ?  ?  ?  ?  ?  ?General transfer comment: modA for power up into standing with face to face HHA assist ?  ? ?Ambulation/Gait ?  ?  ?  ?  ?  ?  ?  ?General Gait Details: deferred ? ?Stairs ?  ?  ?  ?  ?  ? ?Wheelchair Mobility ?  ? ?Modified Rankin (Stroke Patients Only) ?  ? ?  ? ?Balance Overall balance assessment: Needs assistance ?Sitting-balance support: No upper extremity supported, Feet supported ?Sitting balance-Leahy Scale: Fair ?  ?  ?Standing balance support: Bilateral upper extremity supported, Reliant on assistive device for balance ?Standing balance-Leahy Scale: Poor ?Standing balance comment: reliant on therapist assist ?  ?  ?  ?  ?  ?  ?  ?  ?  ?  ?  ?   ? ? ? ?Pertinent Vitals/Pain Pain Assessment ?Pain Assessment: Faces ?Faces Pain Scale: No hurt ?Pain Intervention(s): Monitored during session  ? ? ?Home Living Family/patient expects to be discharged to:: Private residence ?Living Arrangements: Children ?Available Help at Discharge: Family (unsure of she has 24 hr supervision) ?Type of Home: Apartment ?Home Access:  Level entry ?  ?  ?  ?Home Layout: One level ?Home Equipment: Conservation officer, nature (2 wheels);Tub bench ?Additional Comments: Pt reports having the items above, but would need to be confirmed with family.  Unsure of her  current level of function PTA.  She reported that she lived with her son and he is gone to work during the day.  She said she would sleep most of the day but would get up to go to the bathroom.  ?  ?Prior Function Prior Level of Function : Needs assist ? Cognitive Assist : ADLs (cognitive) ?  ?ADLs (Cognitive): Intermittent cues ?Physical Assist : ADLs (physical) ?  ?ADLs (physical): Bathing;Dressing;Toileting ?Mobility Comments: patient states she uses RW but unsure of accuracy ?  ?  ? ? ?Hand Dominance  ?   ? ?  ?Extremity/Trunk Assessment  ? Upper Extremity Assessment ?Upper Extremity Assessment: Defer to OT evaluation ?  ? ?Lower Extremity Assessment ?Lower Extremity Assessment: Generalized weakness ?  ? ?Cervical / Trunk Assessment ?Cervical / Trunk Assessment: Normal  ?Communication  ? Communication: No difficulties  ?Cognition Arousal/Alertness: Awake/alert ?Behavior During Therapy: Flat affect ?Overall Cognitive Status: History of cognitive impairments - at baseline ?  ?  ?  ?  ?  ?  ?  ?  ?  ?  ?  ?  ?  ?  ?  ?  ?General Comments: hx of dementia; No family present to provide baseline ?  ?  ? ?  ?General Comments   ? ?  ?Exercises    ? ?Assessment/Plan  ?  ?PT Assessment Patient needs continued PT services  ?PT Problem List Decreased strength;Decreased activity tolerance;Decreased balance;Decreased mobility;Decreased cognition;Decreased safety awareness;Decreased knowledge of use of DME ? ?   ?  ?PT Treatment Interventions DME instruction;Gait training;Functional mobility training;Therapeutic activities;Therapeutic exercise;Balance training;Patient/family education   ? ?PT Goals (Current goals can be found in the Care Plan section)  ?Acute Rehab PT Goals ?Patient Stated Goal: did not state ?PT Goal Formulation: Patient unable to participate in goal setting ?Time For Goal Achievement: 12/25/21 ?Potential to Achieve Goals: Fair ? ?  ?Frequency Min 2X/week ?  ? ? ?Co-evaluation   ?  ?  ?  ?  ? ? ?  ?AM-PAC PT  "6 Clicks" Mobility  ?Outcome Measure Help needed turning from your back to your side while in a flat bed without using bedrails?: A Lot ?Help needed moving from lying on your back to sitting on the side of a flat bed without using bedrails?: A Lot ?Help needed moving to and from a bed to a chair (including a wheelchair)?: A Lot ?Help needed standing up from a chair using your arms (e.g., wheelchair or bedside chair)?: A Lot ?Help needed to walk in hospital room?: Total ?Help needed climbing 3-5 steps with a railing? : Total ?6 Click Score: 10 ? ?  ?End of Session   ?Activity Tolerance: Patient tolerated treatment well ?Patient left: in bed;with call bell/phone within reach;with bed alarm set ?Nurse Communication: Mobility status ?PT Visit Diagnosis: Muscle weakness (generalized) (M62.81);Unsteadiness on feet (R26.81);Other abnormalities of gait and mobility (R26.89) ?  ? ?Time: 2426-8341 ?PT Time Calculation (min) (ACUTE ONLY): 24 min ? ? ?Charges:   PT Evaluation ?$PT Eval Moderate Complexity: 1 Mod ?PT Treatments ?$Therapeutic Activity: 8-22 mins ?  ?   ? ? ?Elfreida Heggs A. Gilford Rile, PT, DPT ?Acute Rehabilitation Services ?Pager [phone] ?Office [phone] ? ? ?Aanika Defoor A Altovise Wahler ?12/11/2021, 4:49 PM ? ?

## 2021-12-11 NOTE — Progress Notes (Signed)
?                                  PROGRESS NOTE                                             ?                                                                                                                     ?                                         ? ? Patient Demographics:  ? ? Tiffany [name], is a 71 y.o. female, DOB - 26-Jul-1951, TAV:[id number] ? ?Outpatient Primary MD for the patient is Trey Sailors, [place]    LOS - 3  Admit date - 12/08/2021   ? ?Chief Complaint  ?Patient presents with  ? Altered Mental Status  ?    ? ?Brief Narrative (HPI from H&P)  - 71 y.o. female with medical history significant of hypertension, diabetes, dementia ,and obesity who presents to ED BIB son due to tremors in right hand x 5 minutes followed by altered state described as decrease responsiveness from baseline.  ? ? Subjective:  ? ? [facility] today denies any complaints, no significant events overnight as discussed with staff.   ? ? Assessment  & Plan :  ? ?Acute metabolic encephalopathy secondary to acute CVA, seen on MRI brain.  ?- MRI brain done on 12/10/2018 revealed: Punctate acute to subacute right central white matter infarct. Stable findings of advanced chronic microvascular ischemic changes and numerous hypertensive chronic microhemorrhages.  She is currently on aspirin and statin for secondary prevention, LDL was above goal A1c was stable.  Stable echocardiogram.  CTA head and neck noted along with carotid ultrasound.  Full stroke work-up.   ?-Neurology input greatly appreciated, recommendation for dual antiplatelet therapy for 3 weeks, less evidence of DVT or PE then need to to be on full anticoagulation, venous Dopplers with no evidence of DVT, VQ scan still pending. ? ?Questionable partial seizure ?Seen by neurology, management per neurology. ?Seizures precaution.  Placed on Keppra continue.  EEG was nonacute. ?  ?AKI on CKD 3B, suspect prerenal in setting of poor oral  intake. ?Baseline creatinine appears to be 1.5 with GFR 36, gentle hydration and monitor. ?  ?Essential hypertension ?BP not at goal, elevated.  Allow for permissive hypertension due to stroke. ?  ?Presented with UTI ?Culture growing Klebsiella pneumonia, treated with Rocephin. ? ?Prediabetes with hyperglycemia ? ?Lab Results  ?Component Value Date  ? HGBA1C 6.4 (H) 12/09/2021  ? ? ?CBG (last 3)  ?Recent Labs  ?  12/08/21 ?1835  ?  GLUCAP 148*  ? ? ? ?  ? ?   ? ?Condition - Fair ? ?Family Communication  :  son Marta Antu [phone] on 12/10/21 ? ?Code Status : Full ? ?Consults  :  Neuro ? ?PUD Prophylaxis :  ? ? Procedures  :    ? ?EEG - This study is suggestive of mild diffuse encephalopathy, nonspecific etiology. No seizures or epileptiform discharges were seen throughout the recording. ? ?MRI brain.  Punctate acute to subacute right central white matter infarct. Stable findings of advanced chronic microvascular ischemic changes and numerous hypertensive chronic microhemorrhages. ? ?Echocardiogram.  1. Left ventricular ejection fraction, by estimation, is 65 to 70%. The left ventricle has normal function. The left ventricle has no regional wall motion abnormalities. Left ventricular diastolic parameters are consistent with Grade I diastolic dysfunction (impaired relaxation).  2. Right ventricular systolic function is normal. The right ventricular size is normal. Tricuspid regurgitation signal is inadequate for assessing PA pressure.  3. The mitral valve is normal in structure. No evidence of mitral valve regurgitation. No evidence of mitral stenosis.  4. The aortic valve was not well visualized. Aortic valve regurgitation is not visualized. No aortic stenosis is present.  5. Technically difficult study with very poor windows.  ? ?Vascular carotid ultrasound. Right Carotid: The extracranial vessels were near-normal with only minimal wall                thickening or plaque. Left Carotid: The extracranial vessels were  near-normal with only minimal wall thickening or plaque. Vertebrals:  Left vertebral artery demonstrates antegrade flow. Right vertebral  artery was not visualized. Subclavians: Normal flow hemodynamics were seen in bilateral subclavian ? ?CT head - nonacute.  ? ?CTA head and neck- 1. No emergent large vessel occlusion. 2. Approximately 50% stenosis of the proximal left internal carotid artery relative to the more distal vessel. 3. Mild tortuosity of the cervical internal carotid arteries bilaterally without significant stenosis. This is nonspecific, but most commonly seen in the setting of hypertension. 4. Atherosclerotic changes within the cavernous pre cavernous internal carotid arteries bilaterally without significant stenosis. 5. Otherwise unremarkable CTA Circle of Willis without significant proximal stenosis, aneurysm, or branch vessel occlusion. 6. Aortic Atherosclerosis ? ?   ? ?Disposition Plan  :   ? ?Status is: Inpatient ? ?DVT Prophylaxis  :  Heparin added ? ?heparin injection 5,000 Units Start: 12/10/21 1400 ?SCD's Start: 12/08/21 2041 ? ?Lab Results  ?Component Value Date  ? PLT 205 12/11/2021  ? ? ?Diet :  ?Diet Order   ? ?       ?  DIET DYS 3 Room service appropriate? Yes; Fluid consistency: Thin  Diet effective now       ?  ? ?  ?  ? ?  ?  ? ?Inpatient Medications ? ?Scheduled Meds: ?  stroke: early stages of recovery book   Does not apply Once  ? aspirin  300 mg Rectal Daily  ? Or  ? aspirin  81 mg Oral Daily  ? feeding supplement  237 mL Oral BID BM  ? heparin injection (subcutaneous)  5,000 Units Subcutaneous Q8H  ? multivitamin with minerals  1 tablet Oral Daily  ? pneumococcal 20-valent conjugate vaccine  0.5 mL Intramuscular Tomorrow-1000  ? rosuvastatin  10 mg Oral Daily  ? ?Continuous Infusions: ? levETIRAcetam 250 mg (12/11/21 1350)  ? ?PRN Meds:.acetaminophen **OR** acetaminophen (TYLENOL) oral liquid 160 mg/5 mL **OR** acetaminophen, albuterol, senna-docusate ? ?Antibiotics  :    ? ?  Anti-infectives (From admission, onward)  ? ? Start     Dose/Rate Route Frequency Ordered Stop  ? 12/09/21 0215  cefTRIAXone (ROCEPHIN) 1 g in sodium chloride 0.9 % 100 mL IVPB       ? 1 g ?200 mL/hr over 30 Minutes Intravenous Every 24 hours 12/09/21 0200 12/11/21 0047  ? ?  ? ? ? Time Spent in minutes  30 ? ? ?Phillips Climes M.D on 12/11/2021 at 3:13 PM ? ?To page go to [email or web address]  ? ?[facility] -  Office  [phone] ? ?See all Orders from today for further details ? ? ? Objective:  ? ?Vitals:  ? 12/11/21 0019 12/11/21 0500 12/11/21 0751 12/11/21 1159  ?BP: (!) 126/95 (!) 129/91 140/86 134/77  ?Pulse: 65     ?Resp: 10 11    ?Temp: 97.9 ?F (36.6 ?C) 98.1 ?F (36.7 ?C) 98.1 ?F (36.7 ?C) 98 ?F (36.7 ?C)  ?TempSrc: Oral  Oral Axillary  ?SpO2: 100% 100%    ?Weight:      ? ? ?Wt Readings from Last 3 Encounters:  ?12/08/21 98.9 kg  ?07/27/21 99 kg  ?07/22/21 99.8 kg  ? ? ? ?Intake/Output Summary (Last 24 hours) at 12/11/2021 1513 ?Last data filed at 12/11/2021 1508 ?Gross per 24 hour  ?Intake 512.5 ml  ?Output 700 ml  ?Net -187.5 ml  ? ? ? ?Physical Exam ? ?Awake , Normal affect ?Symmetrical Chest wall movement, Good air movement bilaterally, CTAB ?RRR,No Gallops,Rubs or new Murmurs, No Parasternal Heave ?+ve B.Sounds, Abd Soft, No tenderness, No rebound - guarding or rigidity. ?No Cyanosis, Clubbing or edema, No new Rash or bruise   ? ? ?RN pressure injury documentation: ?Pressure Injury 12/09/21 Sacrum Mid Stage 1 -  Intact skin with non-blanchable redness of a localized area usually over a bony prominence. pink (Active)  ?12/09/21 1831  ?Location: Sacrum  ?Location Orientation: Mid  ?Staging: Stage 1 -  Intact skin with non-blanchable redness of a localized area usually over a bony prominence.  ?Wound Description (Comments): pink  ?Present on Admission: Yes  ?Dressing Type Foam - Lift dressing to assess site every shift 12/11/21 0857  ? ? ? Data Review:  ? ? ?CBC ?Recent Labs  ?Lab 12/08/21 ?1821  12/08/21 ?1843 12/10/21 ?5784 12/11/21 ?0157  ?WBC 12.2*  --  5.7 5.8  ?HGB 12.5 11.2* 10.7* 10.5*  ?HCT 39.5 33.0* 33.6* 33.2*  ?PLT 285  --  221 205  ?MCV 88.2  --  87.3 88.5  ?MCH 27.9  --  27.8 28.0  ?MCHC 31.6  --  31.8 3

## 2021-12-11 NOTE — NC FL2 (Signed)
?[place] MEDICAID FL2 LEVEL OF CARE SCREENING TOOL  ?  ? ?IDENTIFICATION  ?Patient Name: ?Tiffany [name] Birthdate: August 16, 1951 Sex: female Admission Date (Current Location): ?12/08/2021  ?[place] and [place] Number: ? [place] ?  Facility and Address:  ?The . [facility], [place] [address] [place], [place] [address] ?     Provider Number: ?[id number]  ?Attending Physician Name and Address:  ?Elgergawy, Silver Huguenin, MD ? Relative Name and Phone Number:  ?Orion Vandervort (Son) [phone] ?   ?Current Level of Care: ?Hospital Recommended Level of Care: ?[place] Prior Approval Number: ?  ? ?Date Approved/Denied: ?  PASRR Number: ?[id number] A ? ?Discharge Plan: ?SNF ?  ? ?Current Diagnoses: ?Patient Active Problem List  ? Diagnosis Date Noted  ? Pressure injury of skin 12/11/2021  ? 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  ? Encephalopathy acute 12/08/2021  ? 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 ([place]) 02/13/2021  ? 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 ([place]) 02/06/2021  ? Gangrene ([place])   ? Diabetes mellitus with peripheral vascular disease ([place]) 09/13/2015  ? Generalized anxiety disorder 06/15/2014  ? Chronic pain 12/28/2013  ? GERD (gastroesophageal reflux disease) 09/01/2013  ? Tobacco abuse 09/01/2013  ? Hypertension   ? Expected blood loss anemia 06/01/2013  ? Morbid obesity ([place]) 06/01/2013  ? S/P left TKA 05/31/2013  ? Preoperative cardiovascular examination 02/18/2013  ? ? ?Orientation RESPIRATION BLADDER Height & Weight   ?  ?Self, Place ? Normal Incontinent, External catheter Weight: 218 lb (98.9 kg) ?Height:     ?BEHAVIORAL SYMPTOMS/MOOD NEUROLOGICAL BOWEL NUTRITION STATUS  ?    Continent Diet (See dc summary)  ?AMBULATORY STATUS COMMUNICATION OF NEEDS Skin   ?Extensive Assist Verbally PU Stage and Appropriate Care (Pressure injury, Sacrum Mid stage 1) ?  ?  ?  ?    ?     ?     ? ? ?Personal Care Assistance  Level of Assistance  ?Bathing, Feeding, Dressing Bathing Assistance: Maximum assistance ?Feeding assistance: Maximum assistance ?Dressing Assistance: Maximum assistance ?   ? ?Functional Limitations Info  ?Sight, Speech, Hearing Sight Info: Adequate ?Hearing Info: Adequate ?Speech Info: Adequate  ? ? ?SPECIAL CARE FACTORS FREQUENCY  ?PT (By licensed PT), OT (By licensed OT)   ?  ?PT Frequency: 5x/week ?OT Frequency: 5x/week ?  ?  ?  ?   ? ? ?Contractures Contractures Info: Not present  ? ? ?Additional Factors Info  ?Code Status, Allergies Code Status Info: Full ?Allergies Info: Cymbalta ?  ?  ?  ?   ? ?Current Medications (12/11/2021):  This is the current hospital active medication list ?Current Facility-Administered Medications  ?Medication Dose Route Frequency Provider Last Rate Last Admin  ?  stroke: early stages of recovery book   Does not apply Once Clance Boll, MD      ? acetaminophen (TYLENOL) tablet 650 mg  650 mg Oral Q4H PRN Clance Boll, MD      ? Or  ? acetaminophen (TYLENOL) 160 MG/5ML solution 650 mg  650 mg Per Tube Q4H PRN Clance Boll, MD      ? Or  ? acetaminophen (TYLENOL) suppository 650 mg  650 mg Rectal Q4H PRN Clance Boll, MD      ? albuterol (PROVENTIL) (2.5 MG/3ML) 0.083% nebulizer solution 2.5 mg  2.5 mg Nebulization Q4H PRN Clance Boll, MD      ? apixaban Arne [place]) tablet  5 mg  5 mg Oral BID Elgergawy, Silver Huguenin, MD      ? feeding supplement (ENSURE ENLIVE / ENSURE PLUS) liquid 237 mL  237 mL Oral BID BM Myles Rosenthal A, MD   237 mL at 12/11/21 1402  ? levETIRAcetam (KEPPRA) tablet 500 mg  500 mg Oral BID Elgergawy, Silver Huguenin, MD      ? multivitamin with minerals tablet 1 tablet  1 tablet Oral Daily Thurnell Lose, MD   1 tablet at 12/11/21 0902  ? pneumococcal 20-valent conjugate vaccine (PREVNAR 20) injection 0.5 mL  0.5 mL Intramuscular Tomorrow-1000 Myles Rosenthal A, MD      ? rosuvastatin (CRESTOR) tablet 10 mg  10 mg Oral Daily Thurnell Lose, MD   10 mg at 12/11/21 0901  ? senna-docusate (Senokot-S) tablet 1 tablet  1 tablet Oral QHS PRN Clance Boll, MD      ? ? ? ?Discharge Medications: ?Please see discharge summary for a list of discharge medications. ? ?Relevant Imaging Results: ? ?Relevant Lab Results: ? ? ?Additional Information ?SSN [id number] ? ?Lissa Morales Zandon Talton, LCSW ? ? ? ? ?

## 2021-12-11 NOTE — Progress Notes (Signed)
Lower extremity venous has been completed.  ? ?Preliminary results in CV Proc.  ? ?Erick Murin Benson Porcaro ?12/11/2021 2:07 PM    ?

## 2021-12-11 NOTE — Progress Notes (Addendum)
STROKE TEAM PROGRESS NOTE  ? ?INTERVAL HISTORY ?No family at the bedside.  Patient initially sleeping, however easily arousable, follows some commands and fully orientated.  Still has slight left nasolabial fold flattening. Pt feels she is at her baseline. It could be result from her old infarcts seen on CT.  ? ?Vitals:  ? 12/11/21 0019 12/11/21 0500 12/11/21 0751 12/11/21 1159  ?BP: (!) 126/95 (!) 129/91 140/86 134/77  ?Pulse: 65     ?Resp: 10 11    ?Temp: 97.9 ?F (36.6 ?C) 98.1 ?F (36.7 ?C) 98.1 ?F (36.7 ?C) 98 ?F (36.7 ?C)  ?TempSrc: Oral  Oral Axillary  ?SpO2: 100% 100%    ?Weight:      ? ?CBC:  ?Recent Labs  ?Lab 12/08/21 ?1821 12/08/21 ?1843 12/10/21 ?9678 12/11/21 ?0157  ?WBC 12.2*  --  5.7 5.8  ?NEUTROABS 10.5*  --   --  3.9  ?HGB 12.5   < > 10.7* 10.5*  ?HCT 39.5   < > 33.6* 33.2*  ?MCV 88.2  --  87.3 88.5  ?PLT 285  --  221 205  ? < > = values in this interval not displayed.  ? ?Basic Metabolic Panel:  ?Recent Labs  ?Lab 12/10/21 ?9381 12/11/21 ?0157  ?NA 141 137  ?K 3.7 3.8  ?CL 106 108  ?CO2 20* 19*  ?GLUCOSE 86 79  ?BUN 28* 31*  ?CREATININE 1.56* 1.63*  ?CALCIUM 8.4* 8.4*  ?MG 2.0 2.0  ? ?Lipid Panel:  ?Recent Labs  ?Lab 12/09/21 ?0336  ?CHOL 128  ?TRIG 97  ?HDL 32*  ?CHOLHDL 4.0  ?VLDL 19  ?LDLCALC 77  ? ?HgbA1c:  ?Recent Labs  ?Lab 12/09/21 ?0336  ?HGBA1C 6.4*  ? ?Urine Drug Screen:  ?Recent Labs  ?Lab 12/08/21 ?0015 12/08/21 ?0016  ?LABOPIA NONE DETECTED  --   ?COCAINSCRNUR NONE DETECTED Negative  ?LABBENZ NONE DETECTED  --   ?AMPHETMU NONE DETECTED  --   ?THCU NONE DETECTED  --   ?LABBARB NONE DETECTED Negative  ?  ?Alcohol Level  ?Recent Labs  ?Lab 12/08/21 ?1839  ?ETH <10  ? ? ?IMAGING past 24 hours ?VAS [place] LOWER EXTREMITY VENOUS (DVT) ? ?Result Date: 12/11/2021 ? Lower Venous DVT Study Patient Name:  Tiffany [name]  Date of Exam:   12/11/2021 Medical Rec #: [id number]         Accession #:    [id number] Date of Birth: [birth year]         Patient Gender: F Patient Age:   70 years Exam Location:  [facility] Procedure:      VAS [place] LOWER EXTREMITY VENOUS (DVT) Referring Phys: Deno Etienne [facility] --------------------------------------------------------------------------------  Indications: Pulmonary embolism.  Limitations: Pain tolerance. Comparison Study: no prior Performing Technologist: Archie Patten RVS  Examination Guidelines: A complete evaluation includes B-mode imaging, spectral Doppler, color Doppler, and power Doppler as needed of all accessible portions of each vessel. Bilateral testing is considered an integral part of a complete examination. Limited examinations for reoccurring indications may be performed as noted. The reflux portion of the exam is performed with the patient in reverse Trendelenburg.  +---------+---------------+---------+-----------+----------+--------------+ RIGHT    CompressibilityPhasicitySpontaneityPropertiesThrombus Aging +---------+---------------+---------+-----------+----------+--------------+ CFV      Full           Yes      Yes                                 +---------+---------------+---------+-----------+----------+--------------+  SFJ      Full                                                        +---------+---------------+---------+-----------+----------+--------------+ FV Prox  Full                                                        +---------+---------------+---------+-----------+----------+--------------+ FV Mid                  Yes      Yes                                 +---------+---------------+---------+-----------+----------+--------------+ FV Distal               Yes      Yes                                 +---------+---------------+---------+-----------+----------+--------------+ PFV      Full                                                        +---------+---------------+---------+-----------+----------+--------------+ POP      Full           Yes      Yes                                  +---------+---------------+---------+-----------+----------+--------------+ PTV      Full                                                        +---------+---------------+---------+-----------+----------+--------------+ PERO     Full                                                        +---------+---------------+---------+-----------+----------+--------------+   +---------+---------------+---------+-----------+----------+--------------+ LEFT     CompressibilityPhasicitySpontaneityPropertiesThrombus Aging +---------+---------------+---------+-----------+----------+--------------+ CFV      Full           Yes      Yes                                 +---------+---------------+---------+-----------+----------+--------------+ SFJ      Full                                                        +---------+---------------+---------+-----------+----------+--------------+  FV Prox  Full                                                        +---------+---------------+---------+-----------+----------+--------------+ FV Mid   Full                                                        +---------+---------------+---------+-----------+----------+--------------+ FV DistalFull                                                        +---------+---------------+---------+-----------+----------+--------------+ PFV      Full                                                        +---------+---------------+---------+-----------+----------+--------------+ POP      Full           Yes      Yes                                 +---------+---------------+---------+-----------+----------+--------------+ PTV      Full                                                        +---------+---------------+---------+-----------+----------+--------------+ PERO     Full                                                         +---------+---------------+---------+-----------+----------+--------------+     Summary: BILATERAL: - No evidence of deep vein thrombosis seen in the lower extremities, bilaterally. -No evidence of popliteal cyst, bilaterally.   *See table(s) above for measurements and observations.    Preliminary    ? ?PHYSICAL EXAM ? ?Physical Exam  ?Constitutional: Appears well-developed and well-nourished.  ?Cardiovascular: Normal rate and regular rhythm.  ?Respiratory: Effort normal, non-labored breathing ?Neuro: ?Mental Status: ?Patient is initially sleeping but easily arousable able to maintain wakefulness, oriented to person, place and time ?No signs of aphasia or neglect, mild dysarthria due to poor denture ?Cranial Nerves: ?II: Visual Fields are full. Pupils are equal, round, and reactive to light.  ?III,IV, VI: EOMI without ptosis or diploplia.  ?V: Facial sensation is symmetric to light touch ?VII: Slight nasolabial fold flattening on the left ?VIII: Hearing is intact to voice ?X: Palate elevates symmetrically ?XI: Shoulder shrug is symmetric. ?XII: Tongue protrudes midline without atrophy or fasciculations.  ?Motor: ?Tone is normal. Bulk is normal. 3/5 strength in the bilateral legs,  elsewhere strength 5/5.  ?Sensory: ?Sensation is symmetric to light touch in the arms and legs. ?Cerebellar: ?FTN grossly intact bilaterally ? ? ?ASSESSMENT/PLAN ?Tiffany [name] is a 71 year old female with a PMHx of HTN, DM, PE in November of 2022, and dementia. She presented to the ED for an episode concerning for seizure, with 5 minutes of R hand jerking, followed by decreased responsiveness and gradual improvement over the next hour. Routine EEG study negative for seizure. MRI showed punctate acute white matter infarcts. MRA head and carotid dopplers were unremarkable.  ? ?Likely seizure ?Presented with right hand shaking, followed by postictal drowsiness ?EEG negative ?Currently on Keppra 250 twice daily due to AKI on CKD. ?Recommend  pharmacy adjusting Keppra doses based on kidney function. ?Continue Keppra on discharge ?Follow-up in neuro clinic at [facility] ? 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

## 2021-12-11 NOTE — Care Management Important Message (Signed)
Important Message ? ?Patient Details  ?Name: Tiffany [name] ?MRN: [id number] ?Date of Birth: March 06, 1951 ? ? ?Medicare Important Message Given:  Yes ? ? ? ? ?Amalie Koran ?12/11/2021, 3:34 PM ?

## 2021-12-11 NOTE — Progress Notes (Signed)
?  Transition of Care (TOC) Screening Note ? ? ?Patient Details  ?Name: Tiffany [name] ?Date of Birth: 04/13/1951 ? ? ?Transition of Care (TOC) CM/SW Contact:    ?Benard Halsted, LCSW ?Phone Number: ?12/11/2021, 9:47 AM ? ? ? ?[facility] has [facility] patient and no TOC needs have been identified at this time. We will continue to monitor patient advancement through interdisciplinary progression rounds. If new patient transition needs arise, please place a TOC consult. ? ? ?

## 2021-12-12 DIAGNOSIS — G9340 Encephalopathy, unspecified: Secondary | ICD-10-CM | POA: Diagnosis not present

## 2021-12-12 DIAGNOSIS — I6330 Cerebral infarction due to thrombosis of unspecified cerebral artery: Secondary | ICD-10-CM | POA: Diagnosis not present

## 2021-12-12 DIAGNOSIS — R569 Unspecified convulsions: Secondary | ICD-10-CM | POA: Diagnosis not present

## 2021-12-12 LAB — COMPREHENSIVE METABOLIC PANEL
ALT: 8 U/L (ref 0–44)
AST: 17 U/L (ref 15–41)
Albumin: 2.8 g/dL — ABNORMAL LOW (ref 3.5–5.0)
Alkaline Phosphatase: 70 U/L (ref 38–126)
Anion gap: 8 (ref 5–15)
BUN: 25 mg/dL — ABNORMAL HIGH (ref 8–23)
CO2: 22 mmol/L (ref 22–32)
Calcium: 8.6 mg/dL — ABNORMAL LOW (ref 8.9–10.3)
Chloride: 109 mmol/L (ref 98–111)
Creatinine, Ser: 1.31 mg/dL — ABNORMAL HIGH (ref 0.44–1.00)
GFR, Estimated: 44 mL/min — ABNORMAL LOW (ref >60)
Glucose, Bld: 77 mg/dL (ref 70–99)
Potassium: 4.1 mmol/L (ref 3.5–5.1)
Sodium: 139 mmol/L (ref 135–145)
Total Bilirubin: 0.9 mg/dL (ref 0.3–1.2)
Total Protein: 6.5 g/dL (ref 6.5–8.1)

## 2021-12-12 LAB — CBC WITH DIFFERENTIAL/PLATELET
Abs Immature Granulocytes: 0.02 10*3/uL (ref 0.00–0.07)
Basophils Absolute: 0 10*3/uL (ref 0.0–0.1)
Basophils Relative: 1 %
Eosinophils Absolute: 0.2 10*3/uL (ref 0.0–0.5)
Eosinophils Relative: 3 %
HCT: 32.4 % — ABNORMAL LOW (ref 36.0–46.0)
Hemoglobin: 10.1 g/dL — ABNORMAL LOW (ref 12.0–15.0)
Immature Granulocytes: 0 %
Lymphocytes Relative: 25 %
Lymphs Abs: 1.3 10*3/uL (ref 0.7–4.0)
MCH: 27.1 pg (ref 26.0–34.0)
MCHC: 31.2 g/dL (ref 30.0–36.0)
MCV: 86.9 fL (ref 80.0–100.0)
Monocytes Absolute: 0.4 10*3/uL (ref 0.1–1.0)
Monocytes Relative: 8 %
Neutro Abs: 3.3 10*3/uL (ref 1.7–7.7)
Neutrophils Relative %: 63 %
Platelets: 233 10*3/uL (ref 150–400)
RBC: 3.73 MIL/uL — ABNORMAL LOW (ref 3.87–5.11)
RDW: 13.2 % (ref 11.5–15.5)
WBC: 5.2 10*3/uL (ref 4.0–10.5)
nRBC: 0 % (ref 0.0–0.2)

## 2021-12-12 LAB — BRAIN NATRIURETIC PEPTIDE: B Natriuretic Peptide: 13.5 pg/mL (ref 0.0–100.0)

## 2021-12-12 LAB — MAGNESIUM: Magnesium: 2.1 mg/dL (ref 1.7–2.4)

## 2021-12-12 NOTE — Progress Notes (Signed)
?                                  PROGRESS NOTE                                             ?                                                                                                                     ?                                         ? ? Patient Demographics:  ? ? Tiffany [name], is a 71 y.o. female, DOB - April 06, 1951, JJO:[id number] ? ?Outpatient Primary MD for the patient is Trey Sailors, [place]    LOS - 4  Admit date - 12/08/2021   ? ?Chief Complaint  ?Patient presents with  ? Altered Mental Status  ?    ? ?Brief Narrative (HPI from H&P)   ?- 71 y.o. female with medical history significant of hypertension, diabetes, dementia ,and obesity who presents to ED BIB son due to tremors in right hand x 5 minutes followed by altered state described as decrease responsiveness from baseline.  ? ? Subjective:  ? ? [facility] today denies any complaints, no significant events overnight as discussed with staff.   ? ? Assessment  & Plan :  ? ?Acute metabolic encephalopathy  ?-Possibly related to postictal state versus acute CVA ?-Mentation significantly improved. ? ?Acute CVA, seen on MRI. ?-Neurology input greatly appreciated. ?-Is not on antithrombotic prior to admission, was supposed to be on Eliquis for recent diagnosis of acute PE, apparently has not not been taking it, she is resumed back on Eliquis for PE treatment course, plan has been discontinued, once anticoagulation course was completed she can be switched to antiplatelet therapy. ?-PT/OT consulted, recommendation for SNF placement. ?-The echo with a preserved EF 65 to 16%, grade 1 diastolic dysfunction, no intra-atrial thrombus shunt ?-Carotid Dopplers unremarkable ?-MRA head with no LVO or high-grade stenosis ? ?Seizures ?-He is presented with right hand shaking, followed by postictal drowsiness ?-EEG is negative. ?-Continue with Keppra on discharge. ?-Follow-up in neuro clinic at [facility] ? ?History of PE ?-Patient  with diagnosis of PE in November 2022, apparently noncompliant with Eliquis, she has been resumed on it. ? ?AKI on CKD 3B, suspect prerenal in setting of poor oral intake. ?Baseline creatinine appears to be 1.5 with GFR 36, gentle hydration and monitor. ?  ?Essential hypertension ?BP not at goal, elevated.  Allow for permissive hypertension due to stroke. ?  ?Presented with UTI ?Culture growing Klebsiella pneumonia, treated with Rocephin. ? ?Prediabetes with hyperglycemia ? ?Lab Results  ?  Component Value Date  ? HGBA1C 6.4 (H) 12/09/2021  ? ? ?CBG (last 3)  ?No results for input(s): GLUCAP in the last 72 hours. ? ? ? ?  ? ?   ? ?Condition - Fair ? ?Family Communication  :  son Marta Antu [phone] on 12/10/21, 4/20  ? ?Code Status : Full ? ?Consults  :  Neuro ? ?PUD Prophylaxis :  ? ? Procedures  :    ? ?EEG - This study is suggestive of mild diffuse encephalopathy, nonspecific etiology. No seizures or epileptiform discharges were seen throughout the recording. ? ?MRI brain.  Punctate acute to subacute right central white matter infarct. Stable findings of advanced chronic microvascular ischemic changes and numerous hypertensive chronic microhemorrhages. ? ?Echocardiogram.  1. Left ventricular ejection fraction, by estimation, is 65 to 70%. The left ventricle has normal function. The left ventricle has no regional wall motion abnormalities. Left ventricular diastolic parameters are consistent with Grade I diastolic dysfunction (impaired relaxation).  2. Right ventricular systolic function is normal. The right ventricular size is normal. Tricuspid regurgitation signal is inadequate for assessing PA pressure.  3. The mitral valve is normal in structure. No evidence of mitral valve regurgitation. No evidence of mitral stenosis.  4. The aortic valve was not well visualized. Aortic valve regurgitation is not visualized. No aortic stenosis is present.  5. Technically difficult study with very poor windows.  ? ?Vascular  carotid ultrasound. Right Carotid: The extracranial vessels were near-normal with only minimal wall                thickening or plaque. Left Carotid: The extracranial vessels were near-normal with only minimal wall thickening or plaque. Vertebrals:  Left vertebral artery demonstrates antegrade flow. Right vertebral  artery was not visualized. Subclavians: Normal flow hemodynamics were seen in bilateral subclavian ? ?CT head - nonacute.  ? ?CTA head and neck- 1. No emergent large vessel occlusion. 2. Approximately 50% stenosis of the proximal left internal carotid artery relative to the more distal vessel. 3. Mild tortuosity of the cervical internal carotid arteries bilaterally without significant stenosis. This is nonspecific, but most commonly seen in the setting of hypertension. 4. Atherosclerotic changes within the cavernous pre cavernous internal carotid arteries bilaterally without significant stenosis. 5. Otherwise unremarkable CTA Circle of Willis without significant proximal stenosis, aneurysm, or branch vessel occlusion. 6. Aortic Atherosclerosis ? ?   ? ?Disposition Plan  :   ? ?Status is: Inpatient ? ?DVT Prophylaxis  :  Heparin added ? ?SCD's Start: 12/08/21 2041 ?apixaban (ELIQUIS) tablet 5 mg  ?Lab Results  ?Component Value Date  ? PLT 233 12/12/2021  ? ? ?Diet :  ?Diet Order   ? ?       ?  DIET DYS 3 Room service appropriate? Yes; Fluid consistency: Thin  Diet effective now       ?  ? ?  ?  ? ?  ?  ? ?Inpatient Medications ? ?Scheduled Meds: ?  stroke: early stages of recovery book   Does not apply Once  ? apixaban  5 mg Oral BID  ? feeding supplement  237 mL Oral BID BM  ? levETIRAcetam  500 mg Oral BID  ? multivitamin with minerals  1 tablet Oral Daily  ? pneumococcal 20-valent conjugate vaccine  0.5 mL Intramuscular Tomorrow-1000  ? rosuvastatin  10 mg Oral Daily  ? ?Continuous Infusions: ? ? ?PRN Meds:.acetaminophen **OR** acetaminophen (TYLENOL) oral liquid 160 mg/5 mL **OR** acetaminophen,  albuterol, senna-docusate ? ?Antibiotics  :   ? ?  Anti-infectives (From admission, onward)  ? ? Start     Dose/Rate Route Frequency Ordered Stop  ? 12/09/21 0215  cefTRIAXone (ROCEPHIN) 1 g in sodium chloride 0.9 % 100 mL IVPB       ? 1 g ?200 mL/hr over 30 Minutes Intravenous Every 24 hours 12/09/21 0200 12/11/21 0047  ? ?  ? ? ? ?Phillips Climes M.D on 12/12/2021 at 1:27 PM ? ?To page go to [email or web address]  ? ?[facility] -  Office  [phone] ? ?See all Orders from today for further details ? ? ? Objective:  ? ?Vitals:  ? 12/12/21 0400 12/12/21 0745 12/12/21 0900 12/12/21 1200  ?BP: 138/78 (!) 146/90  137/72  ?Pulse: 68 69  62  ?Resp: '17 18  19  '$ ?Temp:  97.9 ?F (36.6 ?C)  (!) 97.3 ?F (36.3 ?C)  ?TempSrc:  Oral  Axillary  ?SpO2: 100% 100% 100% 95%  ?Weight:      ? ? ?Wt Readings from Last 3 Encounters:  ?12/08/21 98.9 kg  ?07/27/21 99 kg  ?07/22/21 99.8 kg  ? ? ? ?Intake/Output Summary (Last 24 hours) at 12/12/2021 1327 ?Last data filed at 12/12/2021 1000 ?Gross per 24 hour  ?Intake 512.5 ml  ?Output 750 ml  ?Net -237.5 ml  ? ? ? ? ?Physical Exam ? ?Awake Alert, pleasant, answering questions appropriately but slow to respond ?Symmetrical Chest wall movement, Good air movement bilaterally, CTAB ?RRR,No Gallops,Rubs or new Murmurs, No Parasternal Heave ?+ve B.Sounds, Abd Soft, No tenderness, No rebound - guarding or rigidity. ?No Cyanosis, Clubbing or edema, No new Rash or bruise   ? ? ? ?RN pressure injury documentation: ?Pressure Injury 12/09/21 Sacrum Mid Stage 1 -  Intact skin with non-blanchable redness of a localized area usually over a bony prominence. pink (Active)  ?12/09/21 1831  ?Location: Sacrum  ?Location Orientation: Mid  ?Staging: Stage 1 -  Intact skin with non-blanchable redness of a localized area usually over a bony prominence.  ?Wound Description (Comments): pink  ?Present on Admission: Yes  ?Dressing Type Foam - Lift dressing to assess site every shift 12/11/21 0857  ? ? ? Data Review:   ? ? ?CBC ?Recent Labs  ?Lab 12/08/21 ?1821 12/08/21 ?1843 12/10/21 ?8341 12/11/21 ?0157 12/12/21 ?9622  ?WBC 12.2*  --  5.7 5.8 5.2  ?HGB 12.5 11.2* 10.7* 10.5* 10.1*  ?HCT 39.5 33.0* 33.6* 33.2* 32.4*  ?PLT 28

## 2021-12-12 NOTE — TOC Initial Note (Signed)
Transition of Care (TOC) - Initial/Assessment Note  ? ? ?Patient Details  ?Name: Tiffany [name] ?MRN: [id number] ?Date of Birth: 1950/12/10 ? ?Transition of Care (TOC) CM/SW Contact:    ?Benard Halsted, LCSW ?Phone Number: ?12/12/2021, 11:08 AM ? ?Clinical Narrative:                 ?CSW received consult for possible SNF placement at time of discharge. CSW spoke with patient's son. He expressed understanding of PT recommendation and is agreeable to SNF placement at time of discharge. Patient reports preference for [facility] facility. CSW discussed insurance authorization process and will provide Medicare SNF ratings list. CSW will send out referrals for review.  ? ?Update: CSW provided SNF bed offers and son chose [facility] as it is closer to his house. CSW requested [facility] begin insurance authorization process.  ? ?Skilled Nursing Rehab Facilities-   [email or web address]   Ratings out of 5 possible   ?Name Address  Phone # Quality Care Staffing Health Inspection Overall  ?[facility] [address], [place] '4 5 2 3  '$ ?[facility]  [address], [place] [phone] '3 2 5 5  '$ ?[facility] [place] '3 1 1 1  '$ ?[facility] [address] [place] '3 2 4 4  '$ ?[facility] [address], [place] '1 1 2 1  '$ ?[place] [place] [phone] '2 1 4 3  '$ ?[place] '5 2 3 4  '$ ?[facility] [address] [place] '5 2 2 3  '$ ?[facility]) [place] [phone] '5 1 2 2  '$ ?[facility] [address], [place] [phone] '4 1 2 1  '$ ?[facility] [address] [place] [phone] '4 1 2 1  '$ ?[facility] ([place]) [place], [place] [phone] '4 1 1 1  '$ ?[facility] [address]  [place] '3 2 4 4  '$ ?        ?[place], [place]      ?[facility] [place] '4 2 3 3  '$ ?[facility] [place], [place] '4 1 5 4  '$ ?[place], [place] [address] [place], [place] [phone] '2 1 1 1  '$ ?[facility] [address], [place] [phone] '2 1 3 2  '$ ?        ?[address] (no [facility]) [address], [place] [phone] '4 5 5 5  '$ ?[facility] (No Humana) [address], [place] '3 1 4 3  '$ ?[facility] (No UHC) [address] [place] [phone] '5 5 5 5  '$ ?[facility] [address] [employer] [phone] '3 2 4 4  '$ ?[place] [address], [place] '1 1 2 1  '$ ?[facility] [address] [place] [phone] '2 1 1 1  '$ ?[facility] [place] '5 2 4 5  '$ ?[facility] [address], [place] '3 1 1 1  '$ ?[facility] [place], [place] '2 1 2 1  '$ ?        ?[facility] [address] [place] '1 1 1 1  '$ ?[facility] [address], [place]  [phone] '2 4 2 2  '$ ?[facility] [place] [address], [place] [phone] '5 2 3 4  '$ ?[facility]  [place], [place] [phone] '2 1 1 1  '$ ?[place] (No Humana) [address] [place] '2 1 3 2  '$ ?[facility] [address], [place] [phone] '3 1 1 1  '$ ?        ?[facility] [place], [place] '5 4 5 5  '$ ?[facility])  [address], [place] '2 2 3 3  '$ ?[facility]) [place], [place] '3 2 4 4  '$ ?[facility] [address] [place] '4 3 4 4  '$ ?[address] [place] '3 3 1 1  '$ ?[facility]) [address] [place] [phone] '2 2 4 4   '$ ? ? ? ?Expected Discharge Plan: [place] ?Barriers to Discharge: Insurance Authorization ? ? ?Patient Goals and CMS Choice ?Patient states their goals for this hospitalization and ongoing recovery are:: Rehab ?CMS Medicare.gov Compare Post Acute Care list provided to:: Patient Represenative (must comment) ?Choice offered to / list presented to : Adult Children ? ?Expected Discharge Plan and Services ?Expected Discharge Plan: [place] ?In-house Referral: Clinical Social Work ?  ?Post Acute Care Choice: [place] ?Living arrangements for the past 2 months: [place] ?                ?  ?  ?  ?  ?  ?  ?  ?  ?  ?  ? ?Prior Living Arrangements/Services ?Living arrangements for the past 2 months: [place] ?Lives with:: Adult Children ?Patient language and need for interpreter reviewed:: Yes ?Do you feel safe going back to the place where you live?: Yes      ?Need for Family Participation in Patient Care: Yes (Comment) ?Care giver support system in place?: Yes (comment) ?  ?Criminal Activity/Legal Involvement Pertinent to Current Situation/Hospitalization: No - Comment as needed ? ?Activities of Daily Living ?Home Assistive Devices/Equipment: Gilford Rile (specify type) ?ADL Screening (condition at time of admission) ?Patient's cognitive ability adequate to safely complete daily activities?: No ?Is the patient deaf or have difficulty hearing?: No ?Does the patient have difficulty seeing, even when wearing glasses/contacts?: No ?Does the patient have difficulty concentrating, remembering, or making decisions?: Yes ?Patient able to express need for assistance with ADLs?: Yes ?Does the patient have difficulty dressing or bathing?: Yes ?Independently performs ADLs?: No ?Does the patient have difficulty walking or climbing stairs?: Yes ?Weakness of Legs: Both ?Weakness of Arms/Hands: Both ? ?Permission Sought/Granted ?Permission sought to share information with :  Facility Sport and exercise psychologist, Family Supports ?Permission granted to share information with : No ? Share Information with NAME: Marta Antu ? Permission granted to share info w AGENCY: SNFs ? Permission granted to share info w Relationship: Son ? Permission granted to share info w Contact Information: [phone] ? ?Emotional Assessment ?Appearance:: Appears stated age ?Attitude/Demeanor/Rapport: Unable to Assess ?Affect (typically observed): Unable to Assess ?Orientation: : Oriented to Self, Oriented to Place ?Alcohol / Substance Use: Not Applicable ?Psych Involvement: No (comment) ? ?Admission diagnosis:  Encephalopathy acute [G93.40] ?Altered mental status, unspecified altered mental status type [R41.82] ?Patient Active Problem List  ? Diagnosis Date Noted  ? Pressure injury of skin 12/11/2021  ? 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  ? Encephalopathy acute 12/08/2021  ? 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 ([place]) 02/13/2021  ? Osteomyelitis of fifth toe of  left foot ([place]) 02/06/2021  ? Gangrene ([place])   ? Diabetes mellitus with peripheral vascular disease ([place]) 09/13/2015  ? Generalized anxiety disorder 06/15/2014  ? Chronic pain 12/28/2013  ? GERD (gastroesophageal reflux disease) 09/01/2013  ? Tobacco abuse 09/01/2013  ? Hypertension   ? Expected blood loss anemia 06/01/2013  ? Morbid obesity ([place]) 06/01/2013  ? S/P left TKA 05/31/2013  ? Preoperative cardiovascular examination 02/18/2013  ? ?PCP:  Trey Sailors, PA ?Pharmacy:   ?[id number] [facility] [address]?[place] [address]?Phone: [phone]: [phone]? ?[place] [address] ?[address]?Phone: [phone]: [phone]? ? ? ? ?Social Determinants of Health (SDOH) Interventions ?  ? ?Readmission Risk  Interventions ?   ? View : No data to display.  ?  ?  ?  ? ? ? ?

## 2021-12-13 DIAGNOSIS — G9340 Encephalopathy, unspecified: Secondary | ICD-10-CM | POA: Diagnosis not present

## 2021-12-13 DIAGNOSIS — R569 Unspecified convulsions: Secondary | ICD-10-CM | POA: Diagnosis not present

## 2021-12-13 DIAGNOSIS — I6330 Cerebral infarction due to thrombosis of unspecified cerebral artery: Secondary | ICD-10-CM | POA: Diagnosis not present

## 2021-12-13 NOTE — TOC Progression Note (Signed)
Transition of Care (TOC) - Progression Note  ? ? ?Patient Details  ?Name: Tiffany [name] ?MRN: [id number] ?Date of Birth: November 14, 1950 ? ?Transition of Care (TOC) CM/SW Contact  ?Benard Halsted, LCSW ?Phone Number: ?12/13/2021, 3:06 PM ? ?Clinical Narrative:    ?[facility] received insurance approval for patient. CSW made patient's son aware that she is likely ready to discharge tomorrow. Ambulance forms printed to the unit.  ? ? ?Expected Discharge Plan: [place] ?Barriers to Discharge: Insurance Authorization ? ?Expected Discharge Plan and Services ?Expected Discharge Plan: [place] ?In-house Referral: Clinical Social Work ?  ?Post Acute Care Choice: [place] ?Living arrangements for the past 2 months: [place] ?                ?  ?  ?  ?  ?  ?  ?  ?  ?  ?  ? ? ?Social Determinants of Health (SDOH) Interventions ?  ? ?Readmission Risk Interventions ?   ? View : No data to display.  ?  ?  ?  ? ? ?

## 2021-12-13 NOTE — Progress Notes (Signed)
?                                  PROGRESS NOTE                                             ?                                                                                                                     ?                                         ? ? Patient Demographics:  ? ? Tiffany [name], is a 71 y.o. female, DOB - 01/30/1951, ERD:[id number] ? ?Outpatient Primary MD for the patient is Tiffany [name], [place]    LOS - 5  Admit date - 12/08/2021   ? ?Chief Complaint  ?Patient presents with  ? Altered Mental Status  ?    ? ?Brief Narrative (HPI from H&P)   ?- 71 y.o. female with medical history significant of hypertension, diabetes, dementia ,and obesity who presents to ED BIB son due to tremors in right hand x 5 minutes followed by altered state described as decrease responsiveness from baseline.  ? ? Subjective:  ? ? [name] today denies any complaints, no significant events overnight as discussed with staff.   ? ? Assessment  & Plan :  ? ?Acute metabolic encephalopathy  ?-Possibly related to postictal state versus acute CVA ?-Mentation significantly improved. ? ?Acute CVA, seen on MRI. ?-Neurology input greatly appreciated. ?-Is not on antithrombotic prior to admission, was supposed to be on Eliquis for recent diagnosis of acute PE, apparently has not not been taking it, she is resumed back on Eliquis for PE treatment course, plan has been discontinued, once anticoagulation course was completed she can be switched to antiplatelet therapy. ?-PT/OT consulted, recommendation for SNF placement. ?-The echo with a preserved EF 65 to 56%, grade 1 diastolic dysfunction, no intra-atrial thrombus shunt ?-Carotid Dopplers unremarkable ?-MRA head with no LVO or high-grade stenosis ? ?Seizures ?-He is presented with right hand shaking, followed by postictal drowsiness ?-EEG is negative. ?-Continue with Keppra on discharge. ?-Follow-up in [facility] clinic at [facility] ? ?History of PE ?-Patient  with diagnosis of PE in November 2022, apparently noncompliant with Eliquis, she has been resumed on it. ? ?AKI on CKD 3B, suspect prerenal in setting of poor oral intake. ?Baseline creatinine appears to be 1.5 with GFR 36, gentle hydration and monitor. ?  ?Essential hypertension ?BP not at goal, elevated.  Allow for permissive hypertension due to stroke. ?  ?Presented with UTI ?Culture growing Klebsiella pneumonia, treated with Rocephin. ? ?Prediabetes with hyperglycemia ? ?Lab Results  ?  Component Value Date  ? HGBA1C 6.4 (H) 12/09/2021  ? ? ?CBG (last 3)  ?No results for input(s): GLUCAP in the last 72 hours. ? ? ? ?  ? ?   ? ?Condition - Fair ? ?Family Communication  :  son Tiffany [name] [phone] on 12/10/21, 4/20  ? ?Code Status : Full ? ?Consults  :  Neuro ? ?PUD Prophylaxis :  ? ? Procedures  :    ? ?EEG - This study is suggestive of mild diffuse encephalopathy, nonspecific etiology. No seizures or epileptiform discharges were seen throughout the recording. ? ?MRI brain.  Punctate acute to subacute right central white matter infarct. Stable findings of advanced chronic microvascular ischemic changes and numerous hypertensive chronic microhemorrhages. ? ?Echocardiogram.  1. Left ventricular ejection fraction, by estimation, is 65 to 70%. The left ventricle has normal function. The left ventricle has no regional wall motion abnormalities. Left ventricular diastolic parameters are consistent with Grade I diastolic dysfunction (impaired relaxation).  2. Right ventricular systolic function is normal. The right ventricular size is normal. Tricuspid regurgitation signal is inadequate for assessing PA pressure.  3. The mitral valve is normal in structure. No evidence of mitral valve regurgitation. No evidence of mitral stenosis.  4. The aortic valve was not well visualized. Aortic valve regurgitation is not visualized. No aortic stenosis is present.  5. Technically difficult study with very poor windows.  ? ?Vascular  carotid ultrasound. Right Carotid: The extracranial vessels were near-normal with only minimal wall                thickening or plaque. Left Carotid: The extracranial vessels were near-normal with only minimal wall thickening or plaque. Vertebrals:  Left vertebral artery demonstrates antegrade flow. Right vertebral  artery was not visualized. Subclavians: Normal flow hemodynamics were seen in bilateral subclavian ? ?CT head - nonacute.  ? ?CTA head and neck- 1. No emergent large vessel occlusion. 2. Approximately 50% stenosis of the proximal left internal carotid artery relative to the more distal vessel. 3. Mild tortuosity of the cervical internal carotid arteries bilaterally without significant stenosis. This is nonspecific, but most commonly seen in the setting of hypertension. 4. Atherosclerotic changes within the cavernous pre cavernous internal carotid arteries bilaterally without significant stenosis. 5. Otherwise unremarkable CTA Circle of Willis without significant proximal stenosis, aneurysm, or branch vessel occlusion. 6. Aortic Atherosclerosis ? ?   ? ?Disposition Plan  :   ? ?Status is: Inpatient ? ?DVT Prophylaxis  :  Eliquis ? ?SCD's Start: 12/08/21 2041 ?apixaban (ELIQUIS) tablet 5 mg  ?Lab Results  ?Component Value Date  ? PLT 233 12/12/2021  ? ? ?Diet :  ?Diet Order   ? ?       ?  DIET DYS 3 Room service appropriate? Yes; Fluid consistency: Thin  Diet effective now       ?  ? ?  ?  ? ?  ?  ? ?Inpatient Medications ? ?Scheduled Meds: ?  stroke: early stages of recovery book   Does not apply Once  ? apixaban  5 mg Oral BID  ? feeding supplement  237 mL Oral BID BM  ? levETIRAcetam  500 mg Oral BID  ? multivitamin with minerals  1 tablet Oral Daily  ? pneumococcal 20-valent conjugate vaccine  0.5 mL Intramuscular Tomorrow-1000  ? rosuvastatin  10 mg Oral Daily  ? ?Continuous Infusions: ? ? ?PRN Meds:.acetaminophen **OR** acetaminophen (TYLENOL) oral liquid 160 mg/5 mL **OR** acetaminophen, albuterol,  senna-docusate ? ?Antibiotics  :   ? ?  Anti-infectives (From admission, onward)  ? ? Start     Dose/Rate Route Frequency Ordered Stop  ? 12/09/21 0215  cefTRIAXone (ROCEPHIN) 1 g in sodium chloride 0.9 % 100 mL IVPB       ? 1 g ?200 mL/hr over 30 Minutes Intravenous Every 24 hours 12/09/21 0200 12/11/21 0047  ? ?  ? ? ? ?Phillips Climes M.D on 12/13/2021 at 1:49 PM ? ?To page go to [email or web address]  ? ?[facility] -  Office  [phone] ? ?See all Orders from today for further details ? ? ? Objective:  ? ?Vitals:  ? 12/13/21 0000 12/13/21 0400 12/13/21 0854 12/13/21 1200  ?BP: 126/88 (!) 129/98 124/77 119/80  ?Pulse: 68 69 76 61  ?Resp: '13 14 12 18  '$ ?Temp:  98.5 ?F (36.9 ?C) 98.1 ?F (36.7 ?C) 98.8 ?F (37.1 ?C)  ?TempSrc:  Axillary Oral Oral  ?SpO2: 99% 98% 97% 97%  ?Weight:      ? ? ?Wt Readings from Last 3 Encounters:  ?12/08/21 98.9 kg  ?07/27/21 99 kg  ?07/22/21 99.8 kg  ? ? ? ?Intake/Output Summary (Last 24 hours) at 12/13/2021 1349 ?Last data filed at 12/12/2021 2130 ?Gross per 24 hour  ?Intake 240 ml  ?Output 500 ml  ?Net -260 ml  ? ? ? ? ?Physical Exam ? ?Awake Alert, pleasant, answering questions appropriately but slow to respond ?Symmetrical Chest wall movement, Good air movement bilaterally, CTAB ?RRR,No Gallops,Rubs or new Murmurs, No Parasternal Heave ?+ve B.Sounds, Abd Soft, No tenderness, No rebound - guarding or rigidity. ?No Cyanosis, Clubbing or edema, No new Rash or bruise   ? ? ? ? ?RN pressure injury documentation: ?Pressure Injury 12/09/21 Sacrum Mid Stage 1 -  Intact skin with non-blanchable redness of a localized area usually over a bony prominence. pink (Active)  ?12/09/21 1831  ?Location: Sacrum  ?Location Orientation: Mid  ?Staging: Stage 1 -  Intact skin with non-blanchable redness of a localized area usually over a bony prominence.  ?Wound Description (Comments): pink  ?Present on Admission: Yes  ?Dressing Type Foam - Lift dressing to assess site every shift 12/13/21 0800  ? ? ? Data  Review:  ? ? ?CBC ?Recent Labs  ?Lab 12/08/21 ?1821 12/08/21 ?1843 12/10/21 ?2595 12/11/21 ?0157 12/12/21 ?6387  ?WBC 12.2*  --  5.7 5.8 5.2  ?HGB 12.5 11.2* 10.7* 10.5* 10.1*  ?HCT 39.5 33.0* 33.6* 33.2

## 2021-12-13 NOTE — Progress Notes (Signed)
PT Cancellation Note ? ?Patient Details ?Name: Tiffany [name] ?MRN: [id number] ?DOB: May 15, 1951 ? ? ?Cancelled Treatment:    Reason Eval/Treat Not Completed: Fatigue/lethargy limiting ability to participate;Other (comment).  Mult attempts to see, where pt lethargic and also woke her to try to eat.  Too sleepy to awaken this afternoon.  Try again as time allows. ? ? ?Ramond Dial ?12/13/2021, 4:01 PM ? ?Mee Hives, PT PhD ?Acute Rehab Dept. Number: [facility] [phone] and [place] [phone] ? ?

## 2021-12-14 DIAGNOSIS — R569 Unspecified convulsions: Secondary | ICD-10-CM

## 2021-12-14 DIAGNOSIS — I130 Hypertensive heart and chronic kidney disease with heart failure and stage 1 through stage 4 chronic kidney disease, or unspecified chronic kidney disease: Secondary | ICD-10-CM

## 2021-12-14 DIAGNOSIS — I10 Essential (primary) hypertension: Secondary | ICD-10-CM

## 2021-12-14 DIAGNOSIS — N183 Chronic kidney disease, stage 3 unspecified: Secondary | ICD-10-CM

## 2021-12-14 MED ORDER — ACETAMINOPHEN 325 MG PO TABS
650.0000 mg | ORAL_TABLET | Freq: Four times a day (QID) | ORAL | Status: AC | PRN
Start: 2021-12-14 — End: ?

## 2021-12-14 MED ORDER — APIXABAN 5 MG PO TABS: 5.0000 mg | ORAL_TABLET | Freq: Two times a day (BID) | ORAL | Status: AC

## 2021-12-14 MED ORDER — SENNOSIDES-DOCUSATE SODIUM 8.6-50 MG PO TABS: 1.0000 | ORAL_TABLET | Freq: Every evening | ORAL | Status: DC | PRN

## 2021-12-14 MED ORDER — ALBUTEROL SULFATE (2.5 MG/3ML) 0.083% IN NEBU
2.5000 mg | INHALATION_SOLUTION | RESPIRATORY_TRACT | 12 refills | Status: AC | PRN
Start: 2021-12-14 — End: ?

## 2021-12-14 MED ORDER — ENSURE ENLIVE PO LIQD: 237.0000 mL | Freq: Two times a day (BID) | ORAL | 12 refills | Status: AC

## 2021-12-14 MED ORDER — LEVETIRACETAM 500 MG PO TABS
500.0000 mg | ORAL_TABLET | Freq: Two times a day (BID) | ORAL | Status: AC
Start: 2021-12-14 — End: ?

## 2021-12-14 MED ORDER — AMLODIPINE BESYLATE 10 MG PO TABS: 5.0000 mg | ORAL_TABLET | Freq: Every day | ORAL | 0 refills | Status: DC

## 2021-12-14 MED ORDER — ADULT MULTIVITAMIN W/MINERALS CH: 1.0000 | ORAL_TABLET | Freq: Every day | ORAL | Status: AC

## 2021-12-14 MED ORDER — ROSUVASTATIN CALCIUM 10 MG PO TABS
10.0000 mg | ORAL_TABLET | Freq: Every day | ORAL | Status: AC
Start: 2021-12-14 — End: ?

## 2021-12-14 NOTE — Plan of Care (Signed)
  Problem: Clinical Measurements: Goal: Will remain free from infection Outcome: Progressing   Problem: Coping: Goal: Level of anxiety will decrease Outcome: Progressing   Problem: Safety: Goal: Ability to remain free from injury will improve Outcome: Progressing   Problem: Skin Integrity: Goal: Risk for impaired skin integrity will decrease Outcome: Progressing   

## 2021-12-14 NOTE — TOC Transition Note (Addendum)
Transition of Care (TOC) - CM/SW Discharge Note ? ? ?Patient Details  ?Name: JADE BURKARD ?MRN: [id number] ?Date of Birth: Feb 21, 1951 ? ?Transition of Care (TOC) CM/SW Contact:  ?[place], LCSWA ?Phone Number: ?12/14/2021, 9:19 AM ? ? ?Clinical Narrative:    ? ?SW spoke with Juliann Pulse [employer] [phone]) reports pt can admit today. Juliann Pulse reports she needs to follow up with facility to determine room number and will contact SW.  ? ?Update 1019am ?SW received callback from [facility].  ?Room 130A Call Report: [phone] ?PTAR called ? ? ?Final next level of care: [place] ?Barriers to Discharge: Barriers Resolved ? ? ?Patient Goals and CMS Choice ?Patient states their goals for this hospitalization and ongoing recovery are:: Rehab ?CMS Medicare.gov Compare Post Acute Care list provided to:: Patient Represenative (must comment) ?Choice offered to / list presented to : Adult Children ? ?Discharge Placement ?  ?           ?[facility] ?Patient to be transferred to facility by: PTAR ?Name of family member notified: Son ?Patient and family notified of of transfer: 12/14/21 ? ?Discharge Plan and Services ?In-house Referral: Clinical Social Work ?  ?Post Acute Care Choice: [place]          ?  ?  ?  ?  ?  ?  ?  ?  ?  ?  ? ?Social Determinants of Health (SDOH) Interventions ?  ? ? ?Readmission Risk Interventions ?   ? View : No data to display.  ?  ?  ?  ? ? ? ? ? ?

## 2021-12-14 NOTE — Discharge Summary (Addendum)
Physician Discharge Summary  ?Tiffany [name] UUE:[id number] DOB: Jan 13, 1951 DOA: 12/08/2021 ? ?PCP: Tiffany Sailors, PA ? ?Admit date: 12/08/2021 ?Discharge date: 12/14/2021 ? ?Admitted From: Home ?Disposition:  SNF ? ?Recommendations for Outpatient Follow-up:  ?Patient to follow-up with neurology as an outpatient, ambulatory referral has been sent ?Please obtain BMP/CBC in one week ?Patient  can be switched to dual antiplatelet therapy aspirin and Plavix for 3 weeks then aspirin alone once she finishes her Eliquis treatment for recent diagnosis of PE November 2022. ? ? ?Discharge Condition:Stable ?CODE STATUS:FULL ?Diet recommendation: Heart Healthy, dysphagia 3 with thin liquids ? ?Brief/Interim Summary: ? ? 71 y.o. female with medical history significant of hypertension, diabetes, dementia ,and obesity who presents to ED BIB son due to tremors in right hand x 5 minutes followed by altered state described as decrease responsiveness from baseline.  ? ? Acute metabolic encephalopathy  ?-Possibly related to postictal state versus acute CVA ?-Mentation significantly improved. ?  ?Acute CVA, seen on MRI. ?-Neurology input greatly appreciated. ?-Is not on antithrombotic prior to admission, was supposed to be on Eliquis for recent diagnosis of acute PE, apparently has not not been taking it, she is resumed back on Eliquis for PE treatment course, plan has been discontinued, once anticoagulation course was completed she can be switched to antiplatelet therapy. ?-PT/OT consulted, recommendation for SNF placement. ?-The echo with a preserved EF 65 to 91%, grade 1 diastolic dysfunction, no intra-atrial thrombus shunt ?-Carotid Dopplers unremarkable ?-MRA head with no LVO or high-grade stenosis ?  ?Seizures ?-she presented with right hand shaking, followed by postictal drowsiness ?-EEG is negative. ?-Continue with Keppra on discharge. ?-Follow-up in neuro clinic at [facility] ?  ?History of PE ?-Patient with diagnosis of PE in  November 2022, apparently apparently she took Eliquis for 1 month only, she needs to finish total of 6 months of treatment, so she is resumed on Eliquis on discharge, . ? ?AKI on CKD 3B, suspect prerenal in setting of poor oral intake. ?Baseline creatinine appears to be 1.5 with GFR 36, renal function back to baseline ?  ?Essential hypertension ? Allow for permissive hypertension due to stroke.  Overall blood pressure has been acceptable and she was not on any home medication, she will be started on low-dose Norvasc 5 mg oral daily ?  ?Presented with UTI ?Culture growing Klebsiella pneumonia, treated with Rocephin. ? ? ?Pressure ulcer:POA  ?Pressure Injury 12/09/21 Sacrum Mid Stage 1 -  Intact skin with non-blanchable redness of a localized area usually over a bony prominence. pink (Active)  ?12/09/21 1831  ?Location: Sacrum  ?Location Orientation: Mid  ?Staging: Stage 1 -  Intact skin with non-blanchable redness of a localized area usually over a bony prominence.  ?Wound Description (Comments): pink  ?Present on Admission: Yes  ? ? ? ?  ? ?Discharge Diagnoses:  ?Principal Problem: ?  Encephalopathy acute ?Active Problems: ?  Pressure injury of skin ?  Cerebral thrombosis with cerebral infarction ? ? ? ?Discharge Instructions ? ?Discharge Instructions   ? ? Ambulatory referral to Neurology   Complete by: As directed ?  ? Follow up at [facility] in about 4 weeks. Thanks.  ? Diet - low sodium heart healthy   Complete by: As directed ?  ? Discharge instructions   Complete by: As directed ?  ? Follow with Primary MD Tiffany Sailors, PA /SNF physician ? ?Get CBC, CMP,  checked  by Primary MD next visit.  ? ? ?Activity: As tolerated with Full  fall precautions use walker/cane & assistance as needed ? ? ?Disposition SNF  ? ? ?Diet: Heart Healthy, dysphagia 3 with thin liquids ? ? ?On your next visit with your primary care physician please Get Medicines reviewed and adjusted. ? ? ?Please request your Prim.MD to go over all  Hospital Tests and Procedure/Radiological results at the follow up, please get all Hospital records sent to your Prim MD by signing hospital release before you go home. ? ? ?If you experience worsening of your admission symptoms, develop shortness of breath, life threatening emergency, suicidal or homicidal thoughts you must seek medical attention immediately by calling 911 or calling your MD immediately  if symptoms less severe. ? ?You Must read complete instructions/literature along with all the possible adverse reactions/side effects for all the Medicines you take and that have been prescribed to you. Take any new Medicines after you have completely understood and accpet all the possible adverse reactions/side effects.  ? ?Do not drive, operating heavy machinery, perform activities at heights, swimming or participation in water activities or provide baby sitting services if your were admitted for syncope or siezures until you have seen by Primary MD or a Neurologist and advised to do so again. ? ?Do not drive when taking Pain medications.  ? ? ?Do not take more than prescribed Pain, Sleep and Anxiety Medications ? ?Special Instructions: If you have smoked or chewed Tobacco  in the last 2 yrs please stop smoking, stop any regular Alcohol  and or any Recreational drug use. ? ?Wear Seat belts while driving. ? ? ?Please note ? ?You were cared for by a hospitalist during your hospital stay. If you have any questions about your discharge medications or the care you received while you were in the hospital after you are discharged, you can call the unit and asked to speak with the hospitalist on call if the hospitalist that took care of you is not available. Once you are discharged, your primary care physician will handle any further medical issues. Please note that NO REFILLS for any discharge medications will be authorized once you are discharged, as it is imperative that you return to your primary care physician (or  establish a relationship with a primary care physician if you do not have one) for your aftercare needs so that they can reassess your need for medications and monitor your lab values.  ? Increase activity slowly   Complete by: As directed ?  ? No wound care   Complete by: As directed ?  ? ?  ? ?Allergies as of 12/14/2021   ? ?   Reactions  ? Cymbalta [duloxetine Hcl] Other (See Comments)  ? Sweating, bad dreams,  ? ?  ? ?  ?Medication List  ?  ? ?STOP taking these medications   ? ?Apixaban Starter Pack ('10mg'$  and '5mg'$ ) ?Commonly known as: ELIQUIS STARTER PACK ?Replaced by: apixaban 5 MG Tabs tablet ?  ?aspirin 81 MG EC tablet ?  ?atorvastatin 40 MG tablet ?Commonly known as: LIPITOR ?  ?losartan 50 MG tablet ?Commonly known as: COZAAR ?  ?losartan-hydrochlorothiazide 100-25 MG tablet ?Commonly known as: HYZAAR ?  ?potassium chloride 10 MEQ tablet ?Commonly known as: KLOR-CON ?  ?varenicline 0.5 MG tablet ?Commonly known as: Chantix ?  ? ?  ? ?TAKE these medications   ? ?acetaminophen 325 MG tablet ?Commonly known as: TYLENOL ?Take 2 tablets (650 mg total) by mouth every 6 (six) hours as needed for mild pain, moderate pain, fever or headache (  or temp > 37.5 C (99.5 F)). ?What changed:  ?reasons to take this ?Another medication with the same name was removed. Continue taking this medication, and follow the directions you see here. ?  ?albuterol 108 (90 Base) MCG/ACT inhaler ?Commonly known as: VENTOLIN HFA ?2 puffs every 6 (six) hours as needed for wheezing or shortness of breath. ?What changed: Another medication with the same name was added. Make sure you understand how and when to take each. ?  ?albuterol (2.5 MG/3ML) 0.083% nebulizer solution ?Commonly known as: PROVENTIL ?Take 3 mLs (2.5 mg total) by nebulization every 4 (four) hours as needed for wheezing or shortness of breath. ?What changed: You were already taking a medication with the same name, and this prescription was added. Make sure you understand how and  when to take each. ?  ?amLODipine 10 MG tablet ?Commonly known as: NORVASC ?Take 0.5 tablets (5 mg total) by mouth daily. ?What changed: how much to take ?  ?apixaban 5 MG Tabs tablet ?Commonly known as: Telford Nab

## 2021-12-14 NOTE — Plan of Care (Signed)
?  Problem: Education: ?Goal: Knowledge of General Education information will improve ?Description: Including pain rating scale, medication(s)/side effects and non-pharmacologic comfort measures ?Outcome: Completed/Met ?  ?Problem: Health Behavior/Discharge Planning: ?Goal: Ability to manage health-related needs will improve ?Outcome: Completed/Met ?  ?Problem: Clinical Measurements: ?Goal: Ability to maintain clinical measurements within normal limits will improve ?Outcome: Completed/Met ?Goal: Will remain free from infection ?Outcome: Completed/Met ?Goal: Diagnostic test results will improve ?Outcome: Completed/Met ?Goal: Respiratory complications will improve ?Outcome: Completed/Met ?Goal: Cardiovascular complication will be avoided ?Outcome: Completed/Met ?  ?Problem: Activity: ?Goal: Risk for activity intolerance will decrease ?Outcome: Completed/Met ?  ?Problem: Nutrition: ?Goal: Adequate nutrition will be maintained ?Outcome: Completed/Met ?  ?Problem: Coping: ?Goal: Level of anxiety will decrease ?Outcome: Completed/Met ?  ?Problem: Elimination: ?Goal: Will not experience complications related to bowel motility ?Outcome: Completed/Met ?Goal: Will not experience complications related to urinary retention ?Outcome: Completed/Met ?  ?Problem: Pain Managment: ?Goal: General experience of comfort will improve ?Outcome: Completed/Met ?  ?Problem: Safety: ?Goal: Ability to remain free from injury will improve ?Outcome: Completed/Met ?  ?Problem: Skin Integrity: ?Goal: Risk for impaired skin integrity will decrease ?Outcome: Completed/Met ?  ?Problem: Education: ?Goal: Knowledge of disease or condition will improve ?Outcome: Completed/Met ?Goal: Knowledge of secondary prevention will improve (SELECT ALL) ?Outcome: Completed/Met ?  ?Problem: Health Behavior/Discharge Planning: ?Goal: Ability to manage health-related needs will improve ?Outcome: Completed/Met ?  ?Problem: Nutrition: ?Goal: Risk of aspiration will  decrease ?Outcome: Completed/Met ?  ?

## 2021-12-14 NOTE — Discharge Instructions (Signed)
Follow with Primary MD Trey Sailors, PA /SNF physician ? ?Get CBC, CMP,  checked  by Primary MD next visit.  ? ? ?Activity: As tolerated with Full fall precautions use walker/cane & assistance as needed ? ? ?Disposition SNF  ? ? ?Diet: Heart Healthy, dysphagia 3 with thin liquids ? ? ?On your next visit with your primary care physician please Get Medicines reviewed and adjusted. ? ? ?Please request your Prim.MD to go over all Hospital Tests and Procedure/Radiological results at the follow up, please get all Hospital records sent to your Prim MD by signing hospital release before you go home. ? ? ?If you experience worsening of your admission symptoms, develop shortness of breath, life threatening emergency, suicidal or homicidal thoughts you must seek medical attention immediately by calling 911 or calling your MD immediately  if symptoms less severe. ? ?You Must read complete instructions/literature along with all the possible adverse reactions/side effects for all the Medicines you take and that have been prescribed to you. Take any new Medicines after you have completely understood and accpet all the possible adverse reactions/side effects.  ? ?Do not drive, operating heavy machinery, perform activities at heights, swimming or participation in water activities or provide baby sitting services if your were admitted for syncope or siezures until you have seen by Primary MD or a Neurologist and advised to do so again. ? ?Do not drive when taking Pain medications.  ? ? ?Do not take more than prescribed Pain, Sleep and Anxiety Medications ? ?Special Instructions: If you have smoked or chewed Tobacco  in the last 2 yrs please stop smoking, stop any regular Alcohol  and or any Recreational drug use. ? ?Wear Seat belts while driving. ? ? ?Please note ? ?You were cared for by a hospitalist during your hospital stay. If you have any questions about your discharge medications or the care you received while you were in  the hospital after you are discharged, you can call the unit and asked to speak with the hospitalist on call if the hospitalist that took care of you is not available. Once you are discharged, your primary care physician will handle any further medical issues. Please note that NO REFILLS for any discharge medications will be authorized once you are discharged, as it is imperative that you return to your primary care physician (or establish a relationship with a primary care physician if you do not have one) for your aftercare needs so that they can reassess your need for medications and monitor your lab values.  ?

## 2022-01-14 ENCOUNTER — Inpatient Hospital Stay: Payer: Medicare Other | Admitting: Neurology

## 2022-01-17 ENCOUNTER — Ambulatory Visit: Payer: Medicare (Managed Care) | Admitting: Nurse Practitioner

## 2022-02-18 ENCOUNTER — Ambulatory Visit (INDEPENDENT_AMBULATORY_CARE_PROVIDER_SITE_OTHER): Payer: Medicare (Managed Care) | Admitting: Neurology

## 2022-02-18 ENCOUNTER — Other Ambulatory Visit: Payer: Self-pay

## 2022-02-18 ENCOUNTER — Encounter (HOSPITAL_COMMUNITY): Payer: Self-pay

## 2022-02-18 ENCOUNTER — Inpatient Hospital Stay (HOSPITAL_COMMUNITY)
Admission: EM | Admit: 2022-02-18 | Discharge: 2022-02-28 | DRG: 682 | Disposition: A | Discharge status: Skilled Nursing Facility | Payer: Medicare (Managed Care) | Source: Ambulatory Visit | Attending: Family Medicine | Admitting: Family Medicine

## 2022-02-18 ENCOUNTER — Emergency Department (HOSPITAL_COMMUNITY): Payer: Medicare (Managed Care)

## 2022-02-18 VITALS — BP 70/50 | HR 73

## 2022-02-18 DIAGNOSIS — E1122 Type 2 diabetes mellitus with diabetic chronic kidney disease: Secondary | ICD-10-CM | POA: Diagnosis present

## 2022-02-18 DIAGNOSIS — I639 Cerebral infarction, unspecified: Secondary | ICD-10-CM

## 2022-02-18 DIAGNOSIS — Z8673 Personal history of transient ischemic attack (TIA), and cerebral infarction without residual deficits: Secondary | ICD-10-CM

## 2022-02-18 DIAGNOSIS — E876 Hypokalemia: Secondary | ICD-10-CM | POA: Diagnosis present

## 2022-02-18 DIAGNOSIS — Z86711 Personal history of pulmonary embolism: Secondary | ICD-10-CM

## 2022-02-18 DIAGNOSIS — E669 Obesity, unspecified: Secondary | ICD-10-CM | POA: Diagnosis present

## 2022-02-18 DIAGNOSIS — E861 Hypovolemia: Secondary | ICD-10-CM | POA: Diagnosis present

## 2022-02-18 DIAGNOSIS — F03911 Unspecified dementia, unspecified severity, with agitation: Secondary | ICD-10-CM | POA: Diagnosis present

## 2022-02-18 DIAGNOSIS — Z96652 Presence of left artificial knee joint: Secondary | ICD-10-CM | POA: Diagnosis present

## 2022-02-18 DIAGNOSIS — N179 Acute kidney failure, unspecified: Principal | ICD-10-CM | POA: Insufficient documentation

## 2022-02-18 DIAGNOSIS — E43 Unspecified severe protein-calorie malnutrition: Secondary | ICD-10-CM | POA: Diagnosis present

## 2022-02-18 DIAGNOSIS — F0390 Unspecified dementia without behavioral disturbance: Secondary | ICD-10-CM | POA: Insufficient documentation

## 2022-02-18 DIAGNOSIS — Z751 Person awaiting admission to adequate facility elsewhere: Secondary | ICD-10-CM

## 2022-02-18 DIAGNOSIS — F03C0 Unspecified dementia, severe, without behavioral disturbance, psychotic disturbance, mood disturbance, and anxiety: Secondary | ICD-10-CM

## 2022-02-18 DIAGNOSIS — R55 Syncope and collapse: Secondary | ICD-10-CM | POA: Diagnosis not present

## 2022-02-18 DIAGNOSIS — I959 Hypotension, unspecified: Secondary | ICD-10-CM | POA: Diagnosis present

## 2022-02-18 DIAGNOSIS — Z8249 Family history of ischemic heart disease and other diseases of the circulatory system: Secondary | ICD-10-CM

## 2022-02-18 DIAGNOSIS — R569 Unspecified convulsions: Secondary | ICD-10-CM | POA: Diagnosis present

## 2022-02-18 DIAGNOSIS — Z79899 Other long term (current) drug therapy: Secondary | ICD-10-CM

## 2022-02-18 DIAGNOSIS — I9589 Other hypotension: Secondary | ICD-10-CM | POA: Diagnosis present

## 2022-02-18 DIAGNOSIS — Z89422 Acquired absence of other left toe(s): Secondary | ICD-10-CM

## 2022-02-18 DIAGNOSIS — E639 Nutritional deficiency, unspecified: Secondary | ICD-10-CM

## 2022-02-18 DIAGNOSIS — N1832 Chronic kidney disease, stage 3b: Secondary | ICD-10-CM | POA: Diagnosis present

## 2022-02-18 DIAGNOSIS — Z7901 Long term (current) use of anticoagulants: Secondary | ICD-10-CM

## 2022-02-18 DIAGNOSIS — R4182 Altered mental status, unspecified: Secondary | ICD-10-CM

## 2022-02-18 DIAGNOSIS — Z6832 Body mass index (BMI) 32.0-32.9, adult: Secondary | ICD-10-CM

## 2022-02-18 DIAGNOSIS — I129 Hypertensive chronic kidney disease with stage 1 through stage 4 chronic kidney disease, or unspecified chronic kidney disease: Secondary | ICD-10-CM | POA: Diagnosis present

## 2022-02-18 LAB — URINALYSIS, ROUTINE W REFLEX MICROSCOPIC
Bilirubin Urine: NEGATIVE
Glucose, UA: NEGATIVE mg/dL
Ketones, ur: NEGATIVE mg/dL
Nitrite: NEGATIVE
Protein, ur: NEGATIVE mg/dL
Specific Gravity, Urine: 1.008 (ref 1.005–1.030)
pH: 6 (ref 5.0–8.0)

## 2022-02-18 LAB — CBC WITH DIFFERENTIAL/PLATELET
Abs Immature Granulocytes: 0.04 10*3/uL (ref 0.00–0.07)
Basophils Absolute: 0 10*3/uL (ref 0.0–0.1)
Basophils Relative: 0 %
Eosinophils Absolute: 0 10*3/uL (ref 0.0–0.5)
Eosinophils Relative: 0 %
HCT: 39.3 % (ref 36.0–46.0)
Hemoglobin: 12.1 g/dL (ref 12.0–15.0)
Immature Granulocytes: 0 %
Lymphocytes Relative: 15 %
Lymphs Abs: 1.7 10*3/uL (ref 0.7–4.0)
MCH: 26.7 pg (ref 26.0–34.0)
MCHC: 30.8 g/dL (ref 30.0–36.0)
MCV: 86.6 fL (ref 80.0–100.0)
Monocytes Absolute: 0.5 10*3/uL (ref 0.1–1.0)
Monocytes Relative: 5 %
Neutro Abs: 8.8 10*3/uL — ABNORMAL HIGH (ref 1.7–7.7)
Neutrophils Relative %: 80 %
Platelets: 351 10*3/uL (ref 150–400)
RBC: 4.54 MIL/uL (ref 3.87–5.11)
RDW: 13.2 % (ref 11.5–15.5)
WBC: 11.1 10*3/uL — ABNORMAL HIGH (ref 4.0–10.5)
nRBC: 0 % (ref 0.0–0.2)

## 2022-02-18 LAB — COMPREHENSIVE METABOLIC PANEL
ALT: 19 U/L (ref 0–44)
AST: 22 U/L (ref 15–41)
Albumin: 3.5 g/dL (ref 3.5–5.0)
Alkaline Phosphatase: 104 U/L (ref 38–126)
Anion gap: 14 (ref 5–15)
BUN: 45 mg/dL — ABNORMAL HIGH (ref 8–23)
CO2: 20 mmol/L — ABNORMAL LOW (ref 22–32)
Calcium: 9.4 mg/dL (ref 8.9–10.3)
Chloride: 108 mmol/L (ref 98–111)
Creatinine, Ser: 2.44 mg/dL — ABNORMAL HIGH (ref 0.44–1.00)
GFR, Estimated: 21 mL/min — ABNORMAL LOW (ref >60)
Glucose, Bld: 161 mg/dL — ABNORMAL HIGH (ref 70–99)
Potassium: 3.7 mmol/L (ref 3.5–5.1)
Sodium: 142 mmol/L (ref 135–145)
Total Bilirubin: 1 mg/dL (ref 0.3–1.2)
Total Protein: 7.9 g/dL (ref 6.5–8.1)

## 2022-02-18 LAB — TROPONIN I (HIGH SENSITIVITY)
Troponin I (High Sensitivity): 52 ng/L — ABNORMAL HIGH (ref <18)
Troponin I (High Sensitivity): 34 ng/L — ABNORMAL HIGH (ref <18)
Troponin I (High Sensitivity): 42 ng/L — ABNORMAL HIGH (ref <18)

## 2022-02-18 LAB — CBG MONITORING, ED: Glucose-Capillary: 171 mg/dL — ABNORMAL HIGH (ref 70–99)

## 2022-02-18 MED ORDER — LEVETIRACETAM 500 MG PO TABS
500.0000 mg | ORAL_TABLET | Freq: Two times a day (BID) | ORAL | Status: DC
  Administered 2022-02-18 – 2022-02-28 (×18): 500 mg via ORAL
  Filled 2022-02-18 (×20): qty 1

## 2022-02-18 MED ORDER — APIXABAN 5 MG PO TABS
5.0000 mg | ORAL_TABLET | Freq: Two times a day (BID) | ORAL | Status: DC
  Administered 2022-02-18 – 2022-02-28 (×18): 5 mg via ORAL
  Filled 2022-02-18 (×20): qty 1

## 2022-02-18 MED ORDER — SODIUM CHLORIDE 0.9 % IV SOLN: Freq: Once | INTRAVENOUS | Status: AC

## 2022-02-18 MED ORDER — ROSUVASTATIN CALCIUM 5 MG PO TABS
10.0000 mg | ORAL_TABLET | Freq: Every day | ORAL | Status: DC
  Administered 2022-02-19 – 2022-02-28 (×10): 10 mg via ORAL
  Filled 2022-02-18 (×12): qty 2

## 2022-02-18 MED ORDER — LACTATED RINGERS IV SOLN: INTRAVENOUS | Status: AC

## 2022-02-18 NOTE — Assessment & Plan Note (Deleted)
Creatinine improved to 1.56 with baseline 1.3. Essentially resolved at this time. Electrolytes stable. Likely prerenal 2/2 hypovolemia given hypotension which improved with fluid bolus and has continued to improve with maintenance fluids. -Continue LR at 100 mL/h, stop at 1900 -Daily BMP

## 2022-02-18 NOTE — ED Provider Notes (Signed)
[facility] EMERGENCY DEPARTMENT Provider Note   CSN: [id number] Arrival date & time: 02/18/22  1119     History  Chief Complaint  Patient presents with   Hypotension    Tiffany [name] is a 71 y.o. female.  HPI Patient presents to the ED after going to see an neurology provider this morning for follow-up on stroke.  She apparently has had seizure and has been treated with Keppra.  Current documentation, in the EMR, from today at [facility], 10:49 AM indicates her blood pressure was 70/50.  She was not in distress, and listed as having normal gait, normal mentation.  At triage patient became obtunded and unresponsive.  She was transferred immediately to an exam room where I saw her.  Patient initially appeared to be having agonal respirations.  She was laid supine and seem to improve.  She began to follow commands and stated to me that "I feel bad."  She was unable to specify anything further.   Recent hospitalization, 12/09/2021, had a shaking episode and MRI showed infarcts.  She was started on Keppra.  She was discharged in the hospital to rehab, discharged home on 01/11/2022.     Home Medications Prior to Admission medications   Medication Sig Start Date End Date Taking? Authorizing Provider  acetaminophen (TYLENOL) 325 MG tablet Take 2 tablets (650 mg total) by mouth every 6 (six) hours as needed for mild pain, moderate pain, fever or headache (or temp > 37.5 C (99.5 F)). 12/14/21  Yes Elgergawy, Leana Roe, MD  albuterol (VENTOLIN HFA) 108 (90 Base) MCG/ACT inhaler Inhale 2 puffs into the lungs every 6 (six) hours as needed for wheezing or shortness of breath. 07/04/21  Yes [provider]  amLODipine (NORVASC) 5 MG tablet Take 5 mg by mouth daily. 01/27/22  Yes [provider]  apixaban (ELIQUIS) 5 MG TABS tablet Take 1 tablet (5 mg total) by mouth 2 (two) times daily. 12/14/21  Yes Elgergawy, Leana Roe, MD  levETIRAcetam  (KEPPRA) 500 MG tablet Take 1 tablet (500 mg total) by mouth 2 (two) times daily. 12/14/21  Yes Elgergawy, Leana Roe, MD  losartan-hydrochlorothiazide (HYZAAR) 100-25 MG tablet Take 1 tablet by mouth daily. 01/27/22  Yes [provider]  rosuvastatin (CRESTOR) 10 MG tablet Take 1 tablet (10 mg total) by mouth daily. 12/14/21  Yes Elgergawy, Leana Roe, MD  albuterol (PROVENTIL) (2.5 MG/3ML) 0.083% nebulizer solution Take 3 mLs (2.5 mg total) by nebulization every 4 (four) hours as needed for wheezing or shortness of breath. Patient not taking: Reported on 02/18/2022 12/14/21   Elgergawy, Leana Roe, MD  amLODipine (NORVASC) 10 MG tablet Take 0.5 tablets (5 mg total) by mouth daily. Patient not taking: Reported on 02/18/2022 12/14/21   Elgergawy, Leana Roe, MD  feeding supplement (ENSURE ENLIVE / ENSURE PLUS) LIQD Take 237 mLs by mouth 2 (two) times daily between meals. Patient not taking: Reported on 02/18/2022 12/14/21   Elgergawy, Leana Roe, MD  Multiple Vitamin (MULTIVITAMIN WITH MINERALS) TABS tablet Take 1 tablet by mouth daily. Patient not taking: Reported on 02/18/2022 12/14/21   Elgergawy, Leana Roe, MD  pantoprazole (PROTONIX) 40 MG tablet Take 1 tablet (40 mg total) by mouth daily. Patient not taking: Reported on 02/18/2022 03/06/16   Sharon Seller, NP  polyethylene glycol (MIRALAX / GLYCOLAX) 17 g packet Take 17 g by mouth daily. Patient not taking: Reported on 02/18/2022 02/23/21   Charlynne Pander, MD  senna-docusate (SENOKOT-S) 8.6-50 MG  tablet Take 1 tablet by mouth at bedtime as needed for mild constipation. Patient not taking: Reported on 02/18/2022 12/14/21   Elgergawy, Leana Roe, MD      Allergies    Cymbalta [duloxetine hcl]    Review of Systems   Review of Systems  Physical Exam Updated Vital Signs BP 105/63   Pulse 77   Temp 98.1 F (36.7 C) (Oral)   Resp 12   Ht 5\' 2"  (1.575 m)   Wt 98.9 kg   SpO2 100%   BMI 39.87 kg/m  Physical Exam  ED Results / Procedures /  Treatments   Labs (all labs ordered are listed, but only abnormal results are displayed) Labs Reviewed  CBC WITH DIFFERENTIAL/PLATELET - Abnormal; Notable for the following components:      Result Value   WBC 11.1 (*)    Neutro Abs 8.8 (*)    All other components within normal limits  COMPREHENSIVE METABOLIC PANEL - Abnormal; Notable for the following components:   CO2 20 (*)    Glucose, Bld 161 (*)    BUN 45 (*)    Creatinine, Ser 2.44 (*)    GFR, Estimated 21 (*)    All other components within normal limits  URINALYSIS, ROUTINE W REFLEX MICROSCOPIC - Abnormal; Notable for the following components:   APPearance HAZY (*)    Hgb urine dipstick SMALL (*)    Leukocytes,Ua TRACE (*)    Bacteria, UA RARE (*)    All other components within normal limits  CBG MONITORING, ED - Abnormal; Notable for the following components:   Glucose-Capillary 171 (*)    All other components within normal limits  TROPONIN I (HIGH SENSITIVITY) - Abnormal; Notable for the following components:   Troponin I (High Sensitivity) 34 (*)    All other components within normal limits  TROPONIN I (HIGH SENSITIVITY) - Abnormal; Notable for the following components:   Troponin I (High Sensitivity) 42 (*)    All other components within normal limits    EKG EKG Interpretation  Date/Time:  Tuesday February 18 2022 11:29:26 EDT Ventricular Rate:  105 PR Interval:  136 QRS Duration: 84 QT Interval:  378 QTC Calculation: 499 R Axis:   42 Text Interpretation: Sinus tachycardia Nonspecific ST and T wave abnormality Abnormal ECG When compared with ECG of 08-Dec-2021 18:45, PREVIOUS ECG IS PRESENT Since last tracing rate faster Otherwise no significant change Confirmed by Mancel Bale [id number]) on 02/18/2022 1:20:15 PM  Radiology DG Chest Portable 1 View  Result Date: 02/18/2022 CLINICAL DATA:  Hypotension.  Fatigue. EXAM: PORTABLE CHEST 1 VIEW COMPARISON:  12/10/2021 FINDINGS: The patient is rotated to the right on  today's radiograph, reducing diagnostic sensitivity and specificity. Atherosclerotic calcification of the aortic arch. Mild enlargement of the cardiopericardial silhouette, without edema. Low lung volumes are present, causing crowding of the pulmonary vasculature. No blunting of the costophrenic angles. The lungs appear otherwise clear. Mild thoracic spondylosis. IMPRESSION: 1.  Aortic Atherosclerosis (ICD10-I70.0). 2. Mild enlargement of the cardiopericardial silhouette, without edema. Electronically Signed   By: Gaylyn Rong M.D.   On: 02/18/2022 12:11    Procedures Procedures    Medications Ordered in ED Medications  0.9 %  sodium chloride infusion (0 mLs Intravenous Stopped 02/18/22 1348)    ED Course/ Medical Decision Making/ A&P Clinical Course as of 02/18/22 1554  Tue Feb 18, 2022  1512 Patient's son with her and states that she has recovered her normal mental status at this time.  She is  more alert and responsive, and states she remembers the events, while here in the ED. [EW]    Clinical Course User Index [EW] Mancel Bale, MD                           Medical Decision Making Patient presenting with general weakness, was found to be hypotensive and had a short period of unresponsiveness while at triage.  She improved after being placed supine.  Initial blood pressure low, improved with IV fluids.  She presenting with general malaise and decompensation over the last several weeks while at home.  Problems Addressed: AKI (acute kidney injury) [facility]): complicated acute illness or injury with systemic symptoms that poses a threat to life or bodily functions Altered mental status, unspecified altered mental status type: acute illness or injury with systemic symptoms that poses a threat to life or bodily functions Hypotension due to hypovolemia: acute illness or injury  Amount and/or Complexity of Data Reviewed Independent Historian: caregiver    Details: Patient's son brought  her to the ED today after she was seen at the neurology office.  States she was unable to walk into either visit, and has had to use a wheelchair.  At home she does have a walker.  He relates that over the last several weeks she has been "getting worse."  She has been eating and drinking less, intermittently confused, and generally weak.  He does not describe focal weakness.  He is trying to get her into a skilled nursing facility but does not have an FL 2, or insurance for that, until 02/22/2022.  Apparently when she was in rehab recently she did not participate with medications or activities, so she was discharged. External Data Reviewed: labs, radiology, ECG and notes.    Details: Recent hospitalization April 2023. Recent hospitalization, 12/09/2021, had a shaking episode and MRI showed infarcts.  She was started on Keppra.  She was discharged in the hospital to rehab, discharged home on 01/11/2022. Labs: ordered.    Details: CBC, metabolic panel, urinalysis, troponin-normal except BUN high, creatinine high, CO2 low, glucose high, GFR low, troponin high, urinalysis nondiagnostic Radiology: ordered and independent interpretation performed.    Details: Chest x-ray-no infiltrate or edema ECG/medicine tests: ordered and independent interpretation performed.    Details: Cardiac monitor-sinus tachycardia Discussion of management or test interpretation with external provider(s): Consultation admitting medicine service, to arrange for hospitalization.  Risk Prescription drug management. Decision regarding hospitalization. Risk Details: Patient presenting for evaluation of weakness and hypotension, after going to see her neurologist this morning, for first visit following recent diagnosis of stroke.  She has had general malaise recently decreased appetite and progressive disability for several weeks.  Patient had transient altered mental status, with very low blood pressure, that improved with IV fluid  resuscitation.  She recovered a fairly normal mental status according to her son, at the bedside.  No signs of CVA.  No evidence for seizure.  Suspect general malnutrition, and dehydration as source for hypotension and altered mental status.  She will require hospitalization for stabilization.  Note that son is working on getting her placed.  We will asked TOC to evaluate for placement and arrange for FL 2.  Discussed with hospitalist to arrange for admission.  Critical Care Total time providing critical care: 50 minutes           Final Clinical Impression(s) / ED Diagnoses Final diagnoses:  AKI (acute kidney injury) (HCC)  Hypotension  due to hypovolemia  Altered mental status, unspecified altered mental status type    Rx / DC Orders ED Discharge Orders     None         Mancel Bale, MD 02/18/22 1554

## 2022-02-18 NOTE — ED Notes (Signed)
Patient became unresponsive in triage, apneic breathiing and blank stare.  Faint pulse carotid, no radial pulse immediately transported to room 5 placed on stretcher and zoll.

## 2022-02-18 NOTE — Assessment & Plan Note (Addendum)
Patient is A&O x3 this morning. She is incapable of being dependent and will require long-term facility care. Social worker assisting from home.  Can be reached at [phone]. -TOC consult, appreciate assistance -Delirium precautions

## 2022-02-18 NOTE — Assessment & Plan Note (Addendum)
Resolved. BP now hypertensive this morning -Restart home amlodipine -Monitor BP

## 2022-02-18 NOTE — Plan of Care (Signed)
°  Problem: Clinical Measurements: Goal: Will remain free from infection Outcome: Progressing   Problem: Activity: Goal: Risk for activity intolerance will decrease Outcome: Progressing   Problem: Nutrition: Goal: Adequate nutrition will be maintained Outcome: Progressing

## 2022-02-18 NOTE — Hospital Course (Addendum)
Tiffany [name] is a 71 y.o.female with a history of dementia, CKD 3B, CVA/seizure, HTN, GERD, OA, T2DM, dementia, PE who was admitted to the [facility] Teaching Service at [facility] for hypotension and AKI related to hypovolemia. Her hospital course is detailed below:  AKI Presented with creatinine 2.4 with baseline 1.3.  Received fluid resuscitation throughout and improved back to baseline of 1.3.  Signs of malnutrition and electrolytes of potassium magnesium were replenished during hospitalization as needed.  Hypotension Presented after being advised to go to ED by neurologist for BP 70/50. Hypotension likely in setting of poor PO intake.  Corrected to normotensive ranges with fluid resuscitation.  Blood pressure medications discontinued during this encounter. Restarted home amlodipine on day of discharge. Recommend SNF to follow BP and adjust as necessary.   Dementia Given dementia progression, social work assisted with SNF placement with hope to progress towards more permanent living assistance. Recommend goals of care conversation with family given patient's poor prognosis and little continued benefit from statins, etc.   Other chronic conditions were medically managed with home medications and formulary alternatives as necessary (T2DM, PE, CVA, seizure)  PCP Follow-up Recommendations: Recommend daily monitor of blood pressure for follow-up and re-addition of Hyzaar. Recommend goals of care conversation.

## 2022-02-18 NOTE — Progress Notes (Signed)
FMTS Brief Progress Note  S: Feels well. Admits to poor PO intake in the last several days but isn't able to give clear reason why.   O: BP (!) 119/97 (BP Location: Left Arm)   Pulse 92   Temp 97.6 F (36.4 C) (Oral)   Resp 17   Ht 5\' 2"  (1.575 m)   Wt 81.1 kg   SpO2 98%   BMI 32.70 kg/m   Gen: Awake, pleasant, cooperative with examination Skin: Dry mucous membranes, chapped lips, dry skin Resp: CTAB  Card: RRR  A/P: Hypotension, Prerenal AKI Pressures have improved with fluids. She still appears hypovolemic. Will continue IVF hydration and plan for repeat labs in the AM to monitor renal function.   - Orders reviewed. Labs for AM ordered, which was adjusted as needed.   Sabino Dick, DO 02/18/2022, 10:47 PM PGY-2, [facility] Night Resident  Please page [phone] with questions.

## 2022-02-18 NOTE — ED Triage Notes (Addendum)
Patient presents with low bp fatigue and just overall feeling unwell.  Sent by [facility] due to low bp. Patient had stroke in April and was following up at stroke clinic today.  PA sent patient bc bp was in the 70's SBP in the office.

## 2022-02-19 DIAGNOSIS — Z751 Person awaiting admission to adequate facility elsewhere: Secondary | ICD-10-CM | POA: Diagnosis not present

## 2022-02-19 DIAGNOSIS — E876 Hypokalemia: Secondary | ICD-10-CM | POA: Diagnosis present

## 2022-02-19 DIAGNOSIS — E43 Unspecified severe protein-calorie malnutrition: Secondary | ICD-10-CM | POA: Diagnosis present

## 2022-02-19 DIAGNOSIS — I9589 Other hypotension: Secondary | ICD-10-CM | POA: Diagnosis present

## 2022-02-19 DIAGNOSIS — Z79899 Other long term (current) drug therapy: Secondary | ICD-10-CM | POA: Diagnosis not present

## 2022-02-19 DIAGNOSIS — Z8249 Family history of ischemic heart disease and other diseases of the circulatory system: Secondary | ICD-10-CM | POA: Diagnosis not present

## 2022-02-19 DIAGNOSIS — R569 Unspecified convulsions: Secondary | ICD-10-CM | POA: Diagnosis present

## 2022-02-19 DIAGNOSIS — Z86711 Personal history of pulmonary embolism: Secondary | ICD-10-CM | POA: Diagnosis not present

## 2022-02-19 DIAGNOSIS — F03C0 Unspecified dementia, severe, without behavioral disturbance, psychotic disturbance, mood disturbance, and anxiety: Secondary | ICD-10-CM | POA: Diagnosis not present

## 2022-02-19 DIAGNOSIS — N179 Acute kidney failure, unspecified: Secondary | ICD-10-CM | POA: Diagnosis not present

## 2022-02-19 DIAGNOSIS — E861 Hypovolemia: Secondary | ICD-10-CM | POA: Diagnosis present

## 2022-02-19 DIAGNOSIS — F03911 Unspecified dementia, unspecified severity, with agitation: Secondary | ICD-10-CM | POA: Diagnosis present

## 2022-02-19 DIAGNOSIS — Z6832 Body mass index (BMI) 32.0-32.9, adult: Secondary | ICD-10-CM | POA: Diagnosis not present

## 2022-02-19 DIAGNOSIS — N1832 Chronic kidney disease, stage 3b: Secondary | ICD-10-CM | POA: Diagnosis present

## 2022-02-19 DIAGNOSIS — Z96652 Presence of left artificial knee joint: Secondary | ICD-10-CM | POA: Diagnosis present

## 2022-02-19 DIAGNOSIS — Z7901 Long term (current) use of anticoagulants: Secondary | ICD-10-CM | POA: Diagnosis not present

## 2022-02-19 DIAGNOSIS — I959 Hypotension, unspecified: Secondary | ICD-10-CM | POA: Diagnosis present

## 2022-02-19 DIAGNOSIS — E1122 Type 2 diabetes mellitus with diabetic chronic kidney disease: Secondary | ICD-10-CM | POA: Diagnosis present

## 2022-02-19 DIAGNOSIS — E669 Obesity, unspecified: Secondary | ICD-10-CM | POA: Diagnosis present

## 2022-02-19 DIAGNOSIS — Z89422 Acquired absence of other left toe(s): Secondary | ICD-10-CM | POA: Diagnosis not present

## 2022-02-19 DIAGNOSIS — I129 Hypertensive chronic kidney disease with stage 1 through stage 4 chronic kidney disease, or unspecified chronic kidney disease: Secondary | ICD-10-CM | POA: Diagnosis present

## 2022-02-19 DIAGNOSIS — Z8673 Personal history of transient ischemic attack (TIA), and cerebral infarction without residual deficits: Secondary | ICD-10-CM | POA: Diagnosis not present

## 2022-02-19 LAB — BASIC METABOLIC PANEL
Anion gap: 11 (ref 5–15)
Anion gap: 12 (ref 5–15)
BUN: 33 mg/dL — ABNORMAL HIGH (ref 8–23)
BUN: 29 mg/dL — ABNORMAL HIGH (ref 8–23)
CO2: 22 mmol/L (ref 22–32)
CO2: 20 mmol/L — ABNORMAL LOW (ref 22–32)
Calcium: 8.5 mg/dL — ABNORMAL LOW (ref 8.9–10.3)
Calcium: 8.9 mg/dL (ref 8.9–10.3)
Chloride: 111 mmol/L (ref 98–111)
Chloride: 111 mmol/L (ref 98–111)
Creatinine, Ser: 1.8 mg/dL — ABNORMAL HIGH (ref 0.44–1.00)
Creatinine, Ser: 1.91 mg/dL — ABNORMAL HIGH (ref 0.44–1.00)
GFR, Estimated: 30 mL/min — ABNORMAL LOW (ref >60)
GFR, Estimated: 28 mL/min — ABNORMAL LOW (ref >60)
Glucose, Bld: 89 mg/dL (ref 70–99)
Glucose, Bld: 121 mg/dL — ABNORMAL HIGH (ref 70–99)
Potassium: 2.7 mmol/L — CL (ref 3.5–5.1)
Potassium: 3.4 mmol/L — ABNORMAL LOW (ref 3.5–5.1)
Sodium: 144 mmol/L (ref 135–145)
Sodium: 143 mmol/L (ref 135–145)

## 2022-02-19 LAB — CBC
HCT: 32.7 % — ABNORMAL LOW (ref 36.0–46.0)
Hemoglobin: 10.4 g/dL — ABNORMAL LOW (ref 12.0–15.0)
MCH: 27.1 pg (ref 26.0–34.0)
MCHC: 31.8 g/dL (ref 30.0–36.0)
MCV: 85.2 fL (ref 80.0–100.0)
Platelets: 189 10*3/uL (ref 150–400)
RBC: 3.84 MIL/uL — ABNORMAL LOW (ref 3.87–5.11)
RDW: 13.2 % (ref 11.5–15.5)
WBC: 9.1 10*3/uL (ref 4.0–10.5)
nRBC: 0 % (ref 0.0–0.2)

## 2022-02-19 LAB — TROPONIN I (HIGH SENSITIVITY)
Troponin I (High Sensitivity): 52 ng/L — ABNORMAL HIGH (ref <18)
Troponin I (High Sensitivity): 47 ng/L — ABNORMAL HIGH (ref <18)
Troponin I (High Sensitivity): 48 ng/L — ABNORMAL HIGH (ref <18)

## 2022-02-19 LAB — HIV ANTIBODY (ROUTINE TESTING W REFLEX): HIV Screen 4th Generation wRfx: NONREACTIVE

## 2022-02-19 LAB — MAGNESIUM: Magnesium: 1.5 mg/dL — ABNORMAL LOW (ref 1.7–2.4)

## 2022-02-19 MED ORDER — POTASSIUM CHLORIDE 10 MEQ/100ML IV SOLN
10.0000 meq | INTRAVENOUS | Status: AC
  Administered 2022-02-20 (×4): 10 meq via INTRAVENOUS
  Filled 2022-02-19 (×3): qty 100

## 2022-02-19 MED ORDER — POTASSIUM CHLORIDE 20 MEQ PO PACK
40.0000 meq | PACK | Freq: Three times a day (TID) | ORAL | Status: DC
Start: 2022-02-19 — End: 2022-02-19
  Administered 2022-02-19: 40 meq via ORAL
  Filled 2022-02-19: qty 2

## 2022-02-19 MED ORDER — MAGNESIUM SULFATE 2 GM/50ML IV SOLN
2.0000 g | Freq: Once | INTRAVENOUS | Status: AC
  Administered 2022-02-19: 2 g via INTRAVENOUS
  Filled 2022-02-19: qty 50

## 2022-02-19 MED ORDER — POTASSIUM CHLORIDE 20 MEQ PO PACK
40.0000 meq | PACK | Freq: Once | ORAL | Status: DC
  Filled 2022-02-19: qty 2

## 2022-02-19 MED ORDER — ENSURE ENLIVE PO LIQD
237.0000 mL | Freq: Two times a day (BID) | ORAL | Status: DC
  Administered 2022-02-19 – 2022-02-25 (×2): 237 mL via ORAL

## 2022-02-19 MED ORDER — POTASSIUM CHLORIDE 20 MEQ PO PACK
40.0000 meq | PACK | ORAL | Status: AC
  Administered 2022-02-19 (×2): 40 meq via ORAL
  Filled 2022-02-19 (×2): qty 2

## 2022-02-19 NOTE — Progress Notes (Signed)
Verified with Dr. [name] that he does not want to place patient on telemetry.  Informed that she does not currently have orders.  Will monitor vital signs.

## 2022-02-19 NOTE — Progress Notes (Signed)
     Daily Progress Note Intern Pager: [phone]  Patient name: Tiffany [name] Medical record number: [id number] Date of birth: [birth year] Age: 71 y.o. Gender: female  Primary Care Provider: Trey Sailors, PA Consultants: None Code Status: Full  Pt Overview and Major Events to Date:  6/27: admitted  Assessment and Plan: FREDDY SPADAFORA is a 71 y.o. female who presented with hypotension and AKI with now improved blood pressures. Pertinent PMH/PSH includes T2DM, CVA, siezure, dementia, PE.  * Hypotension Resolved and has continued to be normotensive. -Hold home amlodipine 5 mg daily, Hyzaar 100-25 mg daily -Nutrition following, appreciate assistance -PT/OT eval and treat -Orthostatic vital signs  Hypomagnesemia Magnesium 1.5, likely related to malnutrition.  -Mag 2g IV -AM Mag  Hypokalemia Potassium is 2.7 decreased from 3.7.  Likely related to malnutrition. -Klor-Con packet 40 mEq 3 times daily x1 day -BMP at 1700  AKI (acute kidney injury) ([place]) Creatinine 2.44>1.8 with baseline 1.3.  Likely prerenal 2/2 hypovolemia given hypotension which improved with fluid bolus and has continued to improve with maintenance fluids. -Continue LR at 100 mL/h -Daily BMP  Dementia ([place]) Requiring assistance with placement for long-term care.  Social worker that has been assisting at home can be reached at [phone]. -TOC consult, appreciate assistance -Delirium precautions  Other problems chronic and stable T2DM: Diet controlled PE November 2022: did not complete treatment and restarted apixaban after most recent hospitalization 11/2021; continue apixaban CVA: Continue Eliquis 5 mg twice daily, Crestor 10 mg daily Seizure: Continue Keppra 500 mg twice daily  FEN/GI: carb-modified diet PPx: Eliquis '5mg'$  BID Dispo:SNF pending clinical improvement .   Subjective:  States she is doing well and she had grilled toast for breakfast.  She does not want to see her son but would not  state why.  Objective: Temp:  [97.4 F (36.3 C)-98.2 F (36.8 C)] 98.2 F (36.8 C) (06/28 0753) Pulse Rate:  [72-103] 78 (06/28 0753) Resp:  [10-21] 16 (06/28 0753) BP: (67-120)/(50-97) 118/73 (06/28 0753) SpO2:  [98 %-100 %] 100 % (06/28 0753) Weight:  [81.1 kg-98.9 kg] 81.1 kg (06/27 2218) Physical Exam: General: Awake, alert, oriented to self and place but not time, NAD Cardiovascular: RRR, no murmurs auscultated Respiratory: CTA B, normal WOB Abdomen: Normoactive bowel sounds, soft, nontender Extremities: Dry with cracked skin, no pitting edema  Laboratory: Most recent CBC Lab Results  Component Value Date   WBC 9.1 02/19/2022   HGB 10.4 (L) 02/19/2022   HCT 32.7 (L) 02/19/2022   MCV 85.2 02/19/2022   PLT 189 02/19/2022   Most recent BMP    Latest Ref Rng & Units 02/19/2022    4:38 AM  BMP  Glucose 70 - 99 mg/dL 89   BUN 8 - 23 mg/dL 33   Creatinine 0.44 - 1.00 mg/dL 1.80   Sodium 135 - 145 mmol/L 144   Potassium 3.5 - 5.1 mmol/L 2.7   Chloride 98 - 111 mmol/L 111   CO2 22 - 32 mmol/L 22   Calcium 8.9 - 10.3 mg/dL 8.5     Imaging/Diagnostic Tests: No recent imaging results. [name], DO 02/19/2022, 9:32 AM  PGY-1, [place] Intern pager: [phone], text pages welcome Secure chat group [place]

## 2022-02-19 NOTE — Progress Notes (Signed)
Date and time results received: 02/19/22 0613 (use smartphrase ".now" to insert current time)  Test: potassium Critical Value: 2.7  Name of Provider Notified: Sharion Settler, DO  Orders Received? Or Actions Taken?: Orders Received - See Orders for details

## 2022-02-19 NOTE — Assessment & Plan Note (Deleted)
Potassium is 2.7 decreased from 3.7.  Likely related to malnutrition. -Klor-Con packet 40 mEq 3 times daily x1 day -BMP at 1700

## 2022-02-19 NOTE — Progress Notes (Signed)
Unsuccessful attempt to obtain orthostatic vital signs. Patient was unable to stand more than 5 seconds for the the standing vitals. Provider will be notified.

## 2022-02-19 NOTE — TOC Initial Note (Signed)
Transition of Care [facility]) - Initial/Assessment Note    Patient Details  Name: Tiffany [name] MRN: [id number] Date of Birth: 27-Feb-1951  Transition of Care [facility]) CM/SW Contact:    Geralynn Ochs, LCSW Phone Number: 02/19/2022, 3:27 PM  Clinical Narrative:            CSW contacted patient's son, Marta Antu, to discuss SNF placement and where he is at with working on that. Marta Antu indicated that he's been working with the home health social worker on placement, but wasn't sure where the process was and encouraged CSW to speak with her. CSW discussed with Marta Antu about looking at rehab vs long term care, and Marta Antu discussed need for long term care. CSW explained coverage for long term care would be under the patient's Medicaid, and son seemed confused as he believed the patient's Medicare would cover it. Patient will be switching to [employer] on July 1 instead of [facility] Medicare. CSW explained difference between Medicare and Medicaid, and son didn't seem to understand. CSW spoke with Jenny Reichmann, Education officer, museum with [facility], who had not done anything towards SNF placement. CSW completed referral and sent out for SNF, no bed offers at this time. CSW to follow.   Expected Discharge Plan: Skilled Nursing Facility Barriers to Discharge: Continued Medical Work up, Ship broker   Patient Goals and CMS Choice Patient states their goals for this hospitalization and ongoing recovery are:: patient unable to participate in goal setting, not oriented CMS Medicare.gov Compare Post Acute Care list provided to:: Patient Represenative (must comment) Choice offered to / list presented to : Adult Children  Expected Discharge Plan and Services Expected Discharge Plan: [place] Choice: [place] arrangements for the past 2 months: Single Family Home                                      Prior Living  Arrangements/Services Living arrangements for the past 2 months: Single Family Home Lives with:: Adult Children Patient language and need for interpreter reviewed:: No Do you feel safe going back to the place where you live?: Yes      Need for Family Participation in Patient Care: Yes (Comment) Care giver support system in place?: No (comment) Current home services: Homehealth aide, Home OT, Home PT Criminal Activity/Legal Involvement Pertinent to Current Situation/Hospitalization: No - Comment as needed  Activities of Daily Living      Permission Sought/Granted Permission sought to share information with : Facility Sport and exercise psychologist, Family Supports Permission granted to share information with : Yes, Verbal Permission Granted  Share Information with NAME: Marta Antu  Permission granted to share info w AGENCY: SNF  Permission granted to share info w Relationship: Son     Emotional Assessment Appearance:: Appears stated age Attitude/Demeanor/Rapport: Unable to Assess Affect (typically observed): Unable to Assess Orientation: : Oriented to Self Alcohol / Substance Use: Not Applicable Psych Involvement: No (comment)  Admission diagnosis:  AKI (acute kidney injury) ([place]) [N17.9] Altered mental status, unspecified altered mental status type [R41.82] Hypotension due to hypovolemia [I95.89, E86.1] ARF (acute renal failure) ([place]) [N17.9] Patient Active Problem List   Diagnosis Date Noted   Hypokalemia 02/19/2022   Hypomagnesemia 02/19/2022   ARF (acute renal failure) ([place]) 02/19/2022   Dementia ([place]) 02/18/2022   Stroke ([place]) 02/18/2022   AKI (acute kidney injury) ([place]) 02/18/2022   Hypotension 02/18/2022  Syncope    Seizure ([place]) 12/14/2021   Benign hypertensive heart and CKD, 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), w CHF ([place]) 12/14/2021   Pressure injury of skin 12/11/2021   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   Encephalopathy acute 12/08/2021   S/P femoral-popliteal  bypass surgery 02/13/2021   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 ([place]) 02/13/2021   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 ([place]) 02/06/2021   Gangrene ([place])    Diabetes mellitus with peripheral vascular disease ([place]) 09/13/2015   Generalized anxiety disorder 06/15/2014   Chronic pain 12/28/2013   GERD (gastroesophageal reflux disease) 09/01/2013   Tobacco abuse 09/01/2013   Hypertension    Expected blood loss anemia 06/01/2013   Morbid obesity ([place]) 06/01/2013   S/P left TKA 05/31/2013   Preoperative cardiovascular examination 02/18/2013   PCP:  Trey Sailors, PA Pharmacy:   [id number] [place] [place]Phone: [phone]: [phone] [address] [address]Phone: [phone]: [phone] [id number] [place] [address] [place]: [phone]: [phone]   Social Determinants of Health (SDOH) Interventions    Readmission Risk Interventions     No data to display

## 2022-02-19 NOTE — Progress Notes (Signed)
FMTS Brief Progress Note  S: In to check on patient. She was sleeping comfortably so I did not awaken her.   O: BP (!) 133/93   Pulse 67   Temp 98 F (36.7 C) (Oral)   Resp 17   Ht '5\' 2"'$  (1.575 m)   Wt 81.1 kg   SpO2 99%   BMI 32.70 kg/m   Gen: NAD, sleeping in bed Resp: Normal respiratory effort  A/P: Hypotension Seems to be improving. Awaiting orthostatic vitals- I did discuss with RN about this and she stated she would attempt to obtain them if she is able to stand- though can hold off and do them when she is awake.   AKI Improving with fluids. Continue plan per day team.   Disposition: SNF   - Orders reviewed. Labs for AM ordered, which was adjusted as needed.   Sharion Settler, DO 02/19/2022, 8:55 PM PGY-2, [facility] Night Resident  Please page [phone] with questions.

## 2022-02-19 NOTE — Assessment & Plan Note (Signed)
Magnesium 1.5, likely related to malnutrition.  -Mag 2g IV -AM Mag

## 2022-02-19 NOTE — Evaluation (Signed)
Physical Therapy Evaluation Patient Details Name: Tiffany [name] MRN: [id number] DOB: 1950-10-10 Today's Date: 02/19/2022  History of Present Illness  71 Y/O F with PMHx of HTN, DJD, GERD, S/P Left TKA, Obesity, and dementia was sent in from her neurologist's office for hypotension. Pt then became briefly unresponsive in triage of ED.   Clinical Impression  Pt admitted with above. Pt with known dementia and is a poor historian. Pt with impaired processing, comprehension, sequencing, decreased insight to safety and deficits, in addition to impaired balance and requiring mod/maxA for mobility. Pt unable to care for self safely and requires 24/7 assist for safe return home. Pt to benefit from SNF upon d/c to allow for maximal functional recovery. Acute PT to cont to follow.       Recommendations for follow up therapy are one component of a multi-disciplinary discharge planning process, led by the attending physician.  Recommendations may be updated based on patient status, additional functional criteria and insurance authorization.  Follow Up Recommendations Skilled nursing-short term rehab (<3 hours/day) Can patient physically be transported by private vehicle: No    Assistance Recommended at Discharge Frequent or constant Supervision/Assistance  Patient can return home with the following  Two people to help with walking and/or transfers;Two people to help with bathing/dressing/bathroom;Direct supervision/assist for medications management;Direct supervision/assist for financial management;Assist for transportation;Help with stairs or ramp for entrance    Equipment Recommendations  (TBD at next venue)  Recommendations for Other Services       Functional Status Assessment Patient has had a recent decline in their functional status and demonstrates the ability to make significant improvements in function in a reasonable and predictable amount of time.     Precautions / Restrictions  Precautions Precautions: Fall Precaution Comments: dementia Restrictions Weight Bearing Restrictions: No      Mobility  Bed Mobility Overal bed mobility: Needs Assistance Bed Mobility: Supine to Sit     Supine to sit: Mod assist, Max assist, HOB elevated     General bed mobility comments: pt with minimal initiation, requiring max verbal and tactile cues to initiate, maxA to bring hips to EOB with HOB elevated, once seated pt close min guard for balance    Transfers Overall transfer level: Needs assistance Equipment used: 1 person hand held assist, 2 person hand held assist Transfers: Sit to/from Stand, Bed to chair/wheelchair/BSC Sit to Stand: Max assist, +2 physical assistance     Squat pivot transfers: Mod assist, +2 safety/equipment     General transfer comment: pt with limited effort, suspect due to impaired cognition and inability to comprehend, process, and sequence. First stand attempt with maxAX2 with minimal clearance of buttocks. Pt then given L HHA with tactile cues and maxA posteriorly around back to under R armpit and asked pt to reach with R hand across chair for arm rest, pt followed commands well then initiated step with R foot and was then able to get to the chair    Ambulation/Gait               General Gait Details: unable today  Stairs            Wheelchair Mobility    Modified Rankin (Stroke Patients Only)       Balance Overall balance assessment: Needs assistance Sitting-balance support: Feet supported, Single extremity supported Sitting balance-Leahy Scale: Fair     Standing balance support: Bilateral upper extremity supported, During functional activity, Reliant on assistive device for balance Standing balance-Leahy Scale: Zero Standing  balance comment: dependent on physical assist                             Pertinent Vitals/Pain Pain Assessment Pain Assessment: Faces Faces Pain Scale: No hurt    Home  Living Family/patient expects to be discharged to:: Private residence Living Arrangements: Children (son)                 Additional Comments: pt needs assistance for placement for long term care as son unable to car for his mother    Prior Function Prior Level of Function : Needs assist             Mobility Comments: pt poor historian, per chart pt refusing therapy which is why she was d/c'd from [employer], unsure of how pt was moving at home or if she was using any devices, pt states no when asked about using a w/c or RW ADLs Comments: suspect pt required assist but unsure to what degress, family not present     Hand Dominance   Dominant Hand: Right    Extremity/Trunk Assessment   Upper Extremity Assessment Upper Extremity Assessment: Defer to OT evaluation (generalized weakness, impaired bilat hand dexterity, unable to open ice cream cup, pt with excessively long L thumb nail inhibiting dexterity abilities as well)    Lower Extremity Assessment Lower Extremity Assessment: Generalized weakness    Cervical / Trunk Assessment Cervical / Trunk Assessment: Kyphotic  Communication   Communication: No difficulties  Cognition Arousal/Alertness: Awake/alert Behavior During Therapy: Anxious, Restless (pt confused, kept pulling self up to R EOB on railing) Overall Cognitive Status: History of cognitive impairments - at baseline                                 General Comments: pt with known dementia. Pt oriented to self. Pt picked June when given June or October. Pt otherwise not oriented. Pt with impaired processing, poor memory, decreased awareness of safety and deficits.        General Comments General comments (skin integrity, edema, etc.): VSS    Exercises     Assessment/Plan    PT Assessment Patient needs continued PT services  PT Problem List Decreased strength;Decreased range of motion;Decreased balance;Decreased activity  tolerance;Decreased mobility;Decreased coordination;Decreased cognition;Decreased knowledge of use of DME;Decreased safety awareness       PT Treatment Interventions DME instruction;Gait training;Stair training;Functional mobility training;Therapeutic activities;Therapeutic exercise;Balance training;Neuromuscular re-education;Cognitive remediation;Patient/family education    PT Goals (Current goals can be found in the Care Plan section)  Acute Rehab PT Goals Patient Stated Goal: didn't state PT Goal Formulation: Patient unable to participate in goal setting Time For Goal Achievement: 03/05/22 Potential to Achieve Goals: Good    Frequency Min 2X/week     Co-evaluation               AM-PAC PT "6 Clicks" Mobility  Outcome Measure Help needed turning from your back to your side while in a flat bed without using bedrails?: A Lot Help needed moving from lying on your back to sitting on the side of a flat bed without using bedrails?: A Lot Help needed moving to and from a bed to a chair (including a wheelchair)?: A Lot Help needed standing up from a chair using your arms (e.g., wheelchair or bedside chair)?: A Lot Help needed to walk in hospital room?: Total Help  needed climbing 3-5 steps with a railing? : Total 6 Click Score: 10    End of Session Equipment Utilized During Treatment: Gait belt Activity Tolerance: Patient tolerated treatment well Patient left: in chair;with call bell/phone within reach;with chair alarm set;with nursing/sitter in room Nurse Communication: Mobility status (RN present for transfer) PT Visit Diagnosis: Unsteadiness on feet (R26.81);Muscle weakness (generalized) (M62.81);Difficulty in walking, not elsewhere classified (R26.2)    Time: 0720-0751 PT Time Calculation (min) (ACUTE ONLY): 31 min   Charges:   PT Evaluation $PT Eval Moderate Complexity: 1 Mod PT Treatments $Therapeutic Activity: 8-22 mins        Kittie Plater, PT, DPT Acute  Rehabilitation Services Secure chat preferred Office #: [phone]   Berline Lopes 02/19/2022, 10:42 AM

## 2022-02-19 NOTE — Evaluation (Signed)
Occupational Therapy Evaluation Patient Details Name: Tiffany [name] MRN: [id number] DOB: 1951-08-17 Today's Date: 02/19/2022   History of Present Illness 71 Y/O F with PMHx of HTN, DJD, GERD, S/P Left TKA, Obesity, and dementia was sent in from her neurologist's office for hypotension. Pt then became briefly unresponsive in triage of ED.   Clinical Impression   Pt questionable historian, states she lives with her son who is not home most of the day, however provides PRN assist for ADLs/mobility with RW. Pt sponge bathes at baseline, son provides assist for other ADLs "the best he can". Pt currently mod-max A for ADLs and max A +2 for sit to stand transfer for pad change. Pt currently with decreased strength and ROM in BUE, has long fingernails limiting ability to perform fine motor aspects of eating/grooming tasks. Pt presenting with impairments listed below, will follow acutely. Recommend SNF at d/c.     Recommendations for follow up therapy are one component of a multi-disciplinary discharge planning process, led by the attending physician.  Recommendations may be updated based on patient status, additional functional criteria and insurance authorization.   Follow Up Recommendations  Skilled nursing-short term rehab (<3 hours/day)    Assistance Recommended at Discharge Frequent or constant Supervision/Assistance  Patient can return home with the following Two people to help with walking and/or transfers;Two people to help with bathing/dressing/bathroom;Assistance with cooking/housework;Direct supervision/assist for medications management;Direct supervision/assist for financial management;Assist for transportation;Help with stairs or ramp for entrance    Functional Status Assessment  Patient has had a recent decline in their functional status and demonstrates the ability to make significant improvements in function in a reasonable and predictable amount of time.  Equipment  Recommendations  None recommended by OT;Other (comment) (defer to next venue of care)    Recommendations for Other Services       Precautions / Restrictions Precautions Precautions: Fall Precaution Comments: dementia Restrictions Weight Bearing Restrictions: No      Mobility Bed Mobility               General bed mobility comments: pt up in chair upon arrival    Transfers Overall transfer level: Needs assistance Equipment used: 2 person hand held assist Transfers: Sit to/from Stand Sit to Stand: Max assist, +2 physical assistance                  Balance Overall balance assessment: Needs assistance Sitting-balance support: Feet supported, Single extremity supported Sitting balance-Leahy Scale: Fair     Standing balance support: Bilateral upper extremity supported, During functional activity, Reliant on assistive device for balance Standing balance-Leahy Scale: Zero Standing balance comment: dependent on physical assist                           ADL either performed or assessed with clinical judgement   ADL Overall ADL's : Needs assistance/impaired Eating/Feeding: Minimal assistance;Sitting   Grooming: Minimal assistance;Sitting   Upper Body Bathing: Sitting;Minimal assistance   Lower Body Bathing: Maximal assistance;Sitting/lateral leans   Upper Body Dressing : Moderate assistance;Sitting   Lower Body Dressing: Maximal assistance;Sitting/lateral leans   Toilet Transfer: Maximal assistance;+2 for physical assistance   Toileting- Clothing Manipulation and Hygiene: Maximal assistance       Functional mobility during ADLs: Maximal assistance;+2 for physical assistance       Vision Baseline Vision/History: 1 Wears glasses Additional Comments: will further assess     Perception     Praxis  Pertinent Vitals/Pain Pain Assessment Pain Assessment: No/denies pain     Hand Dominance Right   Extremity/Trunk Assessment Upper  Extremity Assessment Upper Extremity Assessment: Generalized weakness;Difficult to assess due to impaired cognition (pt refusing some ROM/strength testing, able to flex shoulders past 90*, impaired fine motor skills, has very  long fingernails)   Lower Extremity Assessment Lower Extremity Assessment: Defer to PT evaluation   Cervical / Trunk Assessment Cervical / Trunk Assessment: Kyphotic   Communication Communication Communication: No difficulties   Cognition Arousal/Alertness: Awake/alert Behavior During Therapy: Anxious, Restless Overall Cognitive Status: History of cognitive impairments - at baseline                                 General Comments: pt with hx of dementia, decreased awareness of deficits and safety     General Comments  VSS on RA    Exercises     Shoulder Instructions      Home Living Family/patient expects to be discharged to:: Private residence Living Arrangements: Children (Son) Available Help at Discharge: Family;Available PRN/intermittently                         Home Equipment: Rolling Walker (2 wheels)          Prior Functioning/Environment Prior Level of Function : Needs assist             Mobility Comments: reports use of RW, son provided supervision during transfers/ambulation ADLs Comments: states son assisted with ADLs, sponged bathed at sink        OT Problem List: Decreased strength;Decreased range of motion;Decreased activity tolerance;Impaired balance (sitting and/or standing);Decreased cognition;Decreased safety awareness;Decreased knowledge of use of DME or AE      OT Treatment/Interventions: Self-care/ADL training;Therapeutic exercise;Energy conservation;DME and/or AE instruction;Therapeutic activities;Patient/family education;Balance training;Cognitive remediation/compensation;Visual/perceptual remediation/compensation    OT Goals(Current goals can be found in the care plan section) Acute Rehab  OT Goals Patient Stated Goal: none stated OT Goal Formulation: With patient Time For Goal Achievement: 03/05/22 Potential to Achieve Goals: Fair ADL Goals Pt Will Perform Upper Body Dressing: with supervision;sitting Pt Will Perform Lower Body Dressing: sit to/from stand;sitting/lateral leans;with min assist Pt Will Transfer to Toilet: with min assist;squat pivot transfer;stand pivot transfer;bedside commode Pt Will Perform Tub/Shower Transfer: Shower transfer;Tub transfer;tub bench;ambulating;rolling walker;with min assist Additional ADL Goal #1: Pt will complete bed mobility min A in prep for ADLs  OT Frequency: Min 2X/week    Co-evaluation              AM-PAC OT "6 Clicks" Daily Activity     Outcome Measure Help from another person eating meals?: A Little Help from another person taking care of personal grooming?: A Little Help from another person toileting, which includes using toliet, bedpan, or urinal?: A Lot Help from another person bathing (including washing, rinsing, drying)?: A Lot Help from another person to put on and taking off regular upper body clothing?: A Lot Help from another person to put on and taking off regular lower body clothing?: Total 6 Click Score: 13   End of Session Equipment Utilized During Treatment: Gait belt Nurse Communication: Mobility status  Activity Tolerance: Patient limited by fatigue Patient left: in chair;with call bell/phone within reach;with chair alarm set  OT Visit Diagnosis: Unsteadiness on feet (R26.81);Other abnormalities of gait and mobility (R26.89);Muscle weakness (generalized) (M62.81);Other symptoms and signs involving cognitive function  Time: 4327-6147 OT Time Calculation (min): 23 min Charges:  OT General Charges $OT Visit: 1 Visit OT Evaluation $OT Eval Moderate Complexity: 1 Mod OT Treatments $Self Care/Home Management : 8-22 mins  Lynnda Child, OTD, OTR/L Acute Rehab [phone]) [phone]  [place] 02/19/2022, 1:33 PM

## 2022-02-19 NOTE — Plan of Care (Signed)
  Problem: Clinical Measurements: Goal: Will remain free from infection Outcome: Progressing Goal: Respiratory complications will improve Outcome: Not Applicable Goal: Cardiovascular complication will be avoided Outcome: Progressing   Problem: Nutrition: Goal: Adequate nutrition will be maintained Outcome: Progressing

## 2022-02-19 NOTE — Progress Notes (Signed)
Initial Nutrition Assessment  DOCUMENTATION CODES:   Not applicable  INTERVENTION:  Continue current diet as ordered, will change to automatic trays in order.  Ensure Enlive po BID, each supplement provides 350 kcal and 20 grams of protein.  NUTRITION DIAGNOSIS:   Inadequate oral intake related to decreased appetite as evidenced by per patient/family report.  GOAL:   Patient will meet greater than or equal to 90% of their needs  MONITOR:   PO intake, Supplement acceptance, I & O's, Labs  REASON FOR ASSESSMENT:   Consult Assessment of nutrition requirement/status (dehydration, concern for malnutrition)  ASSESSMENT:   Pt with hx of HTN, GERD, DM type 2, hx CVA, and dementia presented to ED with hypotension and AKI from her Neurology appointment. At baseline, pt lives with son.  Pt resting in bedside chair at the time of assessment. Mildly agitated and suspicious of why RD is visiting her.  Pt reluctant to answer nutrition inquiries this AM but did allow for physical exam. Adipose tissue likely masking signs of muscle depletion as son reported intake has recently been poor and pt dehydrated on admission.   Pt eating a magic cup and the time of assessment but said it was "nasty." Denies trouble chewing or swallowing. Discussed with RN and NT. Some breakfast was consumed this AM. Pt only eating when she is in an agreeable mood though. Will add ensure chances to promote PO intake and hydration. Pt will be dc to SNF.  18.1% weight loss noted in the last 6 months (12/3-6/27) which is severe.   Nutritionally Relevant Medications: Scheduled Meds:  potassium chloride  40 mEq Oral Q4H   rosuvastatin  10 mg Oral Daily   Continuous Infusions:  lactated ringers 100 mL/hr at 02/19/22 5366   Labs Reviewed: K 2.7 BUN 33, creatinine 1.8 Mg 1.5 HgbA1c 6.4% (4/17)  NUTRITION - FOCUSED PHYSICAL EXAM:  Flowsheet Row Most Recent Value  Orbital Region Mild depletion  Upper Arm Region  No depletion  Thoracic and Lumbar Region No depletion  Buccal Region No depletion  Temple Region Mild depletion  Clavicle Bone Region No depletion  Clavicle and Acromion Bone Region No depletion  Scapular Bone Region No depletion  Dorsal Hand No depletion  Patellar Region Mild depletion  Anterior Thigh Region Mild depletion  Posterior Calf Region Mild depletion  Edema (RD Assessment) Mild  [bilateral legs]  Hair Reviewed  Eyes Reviewed  Mouth Reviewed  Skin Reviewed  Nails Reviewed   Diet Order:   Diet Order             Diet Carb Modified Fluid consistency: Thin; Room service appropriate? No  Diet effective now                   EDUCATION NEEDS:   Not appropriate for education at this time  Skin:  Skin Assessment: Reviewed RN Assessment  Last BM:  6/27  Height:   Ht Readings from Last 1 Encounters:  02/18/22 '5\' 2"'$  (1.575 m)    Weight:   Wt Readings from Last 1 Encounters:  02/18/22 81.1 kg    Ideal Body Weight:  50 kg  BMI:  Body mass index is 32.7 kg/m.  Estimated Nutritional Needs:   Kcal:  1500-1700 kcal/d  Protein:  75-90g/d  Fluid:  >/=1.5L/d    Ranell Patrick, RD, LDN Clinical Dietitian RD pager # available in [facility]  After hours/weekend pager # available in [facility]

## 2022-02-19 NOTE — NC FL2 (Signed)
MEDICAID FL2 LEVEL OF CARE SCREENING TOOL     IDENTIFICATION  Patient Name: Tiffany [name] Birthdate: 02-Jan-1951 Sex: female Admission Date (Current Location): 02/18/2022  [facility] and [place] Number:  Herbalist and Address:  [facility] [place], [place] [address], [place], [place] [address]      Provider Number: [id number]  Attending Physician Name and Address:  Leeanne Rio, MD  Relative Name and Phone Number:       Current Level of Care: Hospital Recommended Level of Care: [place] Prior Approval Number:    Date Approved/Denied:   PASRR Number: [id number]  Discharge Plan: SNF    Current Diagnoses: Patient Active Problem List   Diagnosis Date Noted   Hypokalemia 02/19/2022   Hypomagnesemia 02/19/2022   Dementia ([place]) 02/18/2022   Stroke ([place]) 02/18/2022   AKI (acute kidney injury) ([place]) 02/18/2022   Hypotension 02/18/2022   Syncope    Seizure ([place]) 12/14/2021   Benign hypertensive heart and CKD, 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), w CHF ([place]) 12/14/2021   Pressure injury of skin 12/11/2021   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   Encephalopathy acute 12/08/2021   S/P femoral-popliteal bypass surgery 02/13/2021   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 ([place]) 02/13/2021   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 ([place]) 02/06/2021   Gangrene ([place])    Diabetes mellitus with peripheral vascular disease ([place]) 09/13/2015   Generalized anxiety disorder 06/15/2014   Chronic pain 12/28/2013   GERD (gastroesophageal reflux disease) 09/01/2013   Tobacco abuse 09/01/2013   Hypertension    Expected blood loss anemia 06/01/2013   Morbid obesity ([place]) 06/01/2013   S/P left TKA 05/31/2013   Preoperative cardiovascular examination 02/18/2013    Orientation RESPIRATION BLADDER Height & Weight     Self  Normal Incontinent Weight: 178 lb 12.7 oz (81.1 kg) Height:  '5\' 2"'$  (157.5 cm)  BEHAVIORAL SYMPTOMS/MOOD  NEUROLOGICAL BOWEL NUTRITION STATUS    Convulsions/Seizures Continent Diet (carb modified)  AMBULATORY STATUS COMMUNICATION OF NEEDS Skin   Extensive Assist Verbally Normal                       Personal Care Assistance Level of Assistance  Bathing, Feeding, Dressing Bathing Assistance: Maximum assistance Feeding assistance: Limited assistance Dressing Assistance: Maximum assistance     Functional Limitations Info             SPECIAL CARE FACTORS FREQUENCY  PT (By licensed PT), OT (By licensed OT)     PT Frequency: 5x/wk OT Frequency: 5x/wk            Contractures Contractures Info: Not present    Additional Factors Info  Code Status, Allergies Code Status Info: Full Allergies Info: Cymbalta (Duloxetine Hcl)           Current Medications (02/19/2022):  This is the current hospital active medication list Current Facility-Administered Medications  Medication Dose Route Frequency Provider Last Rate Last Admin   apixaban (ELIQUIS) tablet 5 mg  5 mg Oral BID [name], DO   5 mg at 02/19/22 7893   lactated ringers infusion   Intravenous Continuous [name], DO 100 mL/hr at 02/19/22 0717 New Bag at 02/19/22 0717   levETIRAcetam (KEPPRA) tablet 500 mg  500 mg Oral BID [name], DO   500 mg at 02/19/22 8101   magnesium sulfate IVPB 2 g 50 mL  2 g Intravenous Once [name], DO 50 mL/hr at  02/19/22 1048 2 g at 02/19/22 1048   potassium chloride (KLOR-CON) packet 40 mEq  40 mEq Oral Q4H Alcus Dad, MD   40 mEq at 02/19/22 1048   rosuvastatin (CRESTOR) tablet 10 mg  10 mg Oral Daily [name], DO   10 mg at 02/19/22 9728     Discharge Medications: Please see discharge summary for a list of discharge medications.  Relevant Imaging Results:  Relevant Lab Results:   Additional Information SS#: [id number]  Geralynn Ochs, LCSW

## 2022-02-20 DIAGNOSIS — F03C0 Unspecified dementia, severe, without behavioral disturbance, psychotic disturbance, mood disturbance, and anxiety: Secondary | ICD-10-CM | POA: Diagnosis not present

## 2022-02-20 DIAGNOSIS — I9589 Other hypotension: Secondary | ICD-10-CM

## 2022-02-20 DIAGNOSIS — E861 Hypovolemia: Secondary | ICD-10-CM | POA: Diagnosis not present

## 2022-02-20 LAB — BASIC METABOLIC PANEL
Anion gap: 5 (ref 5–15)
BUN: 24 mg/dL — ABNORMAL HIGH (ref 8–23)
CO2: 22 mmol/L (ref 22–32)
Calcium: 8.2 mg/dL — ABNORMAL LOW (ref 8.9–10.3)
Chloride: 114 mmol/L — ABNORMAL HIGH (ref 98–111)
Creatinine, Ser: 1.56 mg/dL — ABNORMAL HIGH (ref 0.44–1.00)
GFR, Estimated: 36 mL/min — ABNORMAL LOW (ref >60)
Glucose, Bld: 89 mg/dL (ref 70–99)
Potassium: 3.8 mmol/L (ref 3.5–5.1)
Sodium: 141 mmol/L (ref 135–145)

## 2022-02-20 LAB — MAGNESIUM: Magnesium: 1.8 mg/dL (ref 1.7–2.4)

## 2022-02-20 NOTE — Progress Notes (Signed)
     Daily Progress Note Intern Pager: [phone]  Patient name: Tiffany [name] Medical record number: [id number] Date of birth: April 23, 1951 Age: 71 y.o. Gender: female  Primary Care Provider: Trey Sailors, PA Consultants: None Code Status: Full  Pt Overview and Major Events to Date:  6/27: admitted 6/29: AKI and electrolyte abnormalities resolved  Assessment and Plan: Tiffany [name] is a 71 y.o. female who presented with hypotension and AKI which have now resolved. Pertinent PMH/PSH includes T2DM, CVA, siezure, dementia, PE.  Stable for discharge. * Hypotension Resolved and has continued to be normotensive. -Hold home amlodipine 5 mg daily, Hyzaar 100-25 mg daily -Nutrition following, appreciate assistance -PT/OT eval and treat  AKI (acute kidney injury) ([place]) Creatinine improved to 1.56 with baseline 1.3. Essentially resolved at this time. Electrolytes stable. Likely prerenal 2/2 hypovolemia given hypotension which improved with fluid bolus and has continued to improve with maintenance fluids. -Continue LR at 100 mL/h, stop at 1900 -Daily BMP  Dementia ([place]) Requiring assistance with placement for long-term care.  Social worker that has been assisting at home can be reached at [phone]. -TOC consult, appreciate assistance -Delirium precautions  Other problems chronic and stable T2DM: Diet controlled PE November 2022: did not complete treatment and restarted apixaban after most recent hospitalization 11/2021; continue apixaban CVA: Continue Eliquis 5 mg twice daily, Crestor 10 mg daily Seizure: Continue Keppra 500 mg twice daily  FEN/GI: As carb modified diet PPx: Eliquis 5 mg twice daily Dispo:SNF  stable for discharge when placement available .   Subjective:  States she is doing well, denies any pain or difficulty breathing.  Objective: Temp:  [98 F (36.7 C)-99 F (37.2 C)] 98.4 F (36.9 C) (06/29 0825) Pulse Rate:  [64-75] 64 (06/29 0825) Resp:   [16-20] 18 (06/29 0825) BP: (100-133)/(66-93) 100/66 (06/29 0825) SpO2:  [98 %-100 %] 100 % (06/29 0825) Physical Exam: General: Awake, alert, NAD, oriented to self and place but not time Cardiovascular: RRR, no murmurs auscultated Respiratory: CTA B, normal WOB Abdomen: Soft, nontender, nondistended, normoactive BS Extremities: No pitting edema  Laboratory: Most recent CBC Lab Results  Component Value Date   WBC 9.1 02/19/2022   HGB 10.4 (L) 02/19/2022   HCT 32.7 (L) 02/19/2022   MCV 85.2 02/19/2022   PLT 189 02/19/2022   Most recent BMP    Latest Ref Rng & Units 02/20/2022    3:43 AM  BMP  Glucose 70 - 99 mg/dL 89   BUN 8 - 23 mg/dL 24   Creatinine 0.44 - 1.00 mg/dL 1.56   Sodium 135 - 145 mmol/L 141   Potassium 3.5 - 5.1 mmol/L 3.8   Chloride 98 - 111 mmol/L 114   CO2 22 - 32 mmol/L 22   Calcium 8.9 - 10.3 mg/dL 8.2    Magnesium 1.8  Imaging/Diagnostic Tests: No recent imaging results. [name], DO 02/20/2022, 10:59 AM  PGY-1, [place] Intern pager: [phone], text pages welcome Secure chat group [place]

## 2022-02-20 NOTE — Progress Notes (Signed)
Pt would only take half her meds crushed in ice cream.  Pt yelled " I do not want my medicines".   Will continue to monitor.

## 2022-02-21 DIAGNOSIS — I9589 Other hypotension: Secondary | ICD-10-CM | POA: Diagnosis not present

## 2022-02-21 DIAGNOSIS — E639 Nutritional deficiency, unspecified: Secondary | ICD-10-CM

## 2022-02-21 DIAGNOSIS — E861 Hypovolemia: Secondary | ICD-10-CM | POA: Diagnosis not present

## 2022-02-21 LAB — BASIC METABOLIC PANEL
Anion gap: 8 (ref 5–15)
BUN: 19 mg/dL (ref 8–23)
CO2: 23 mmol/L (ref 22–32)
Calcium: 8.6 mg/dL — ABNORMAL LOW (ref 8.9–10.3)
Chloride: 113 mmol/L — ABNORMAL HIGH (ref 98–111)
Creatinine, Ser: 1.54 mg/dL — ABNORMAL HIGH (ref 0.44–1.00)
GFR, Estimated: 36 mL/min — ABNORMAL LOW (ref >60)
Glucose, Bld: 82 mg/dL (ref 70–99)
Potassium: 3.4 mmol/L — ABNORMAL LOW (ref 3.5–5.1)
Sodium: 144 mmol/L (ref 135–145)

## 2022-02-21 MED ORDER — POTASSIUM CHLORIDE 10 MEQ/100ML IV SOLN
10.0000 meq | INTRAVENOUS | Status: AC
  Administered 2022-02-21 (×4): 10 meq via INTRAVENOUS
  Filled 2022-02-21: qty 100

## 2022-02-21 NOTE — TOC Progression Note (Signed)
Transition of Care [facility]) - Progression Note    Patient Details  Name: Tiffany [name] MRN: [id number] Date of Birth: 22-Jun-1951  Transition of Care [facility]) CM/SW [place], [place] Phone Number: 02/21/2022, 3:39 PM  Clinical Narrative:   CSW spoke with patient's son, Marta Antu, to provide bed offers for review. Marta Antu to review options available and CSW to follow up on choice. Patient new [facility] Medicare policy will take effect tomorrow, 7/1, and CSW asked Marta Antu to provide that updated card when he is able. CSW updated MD with barriers to discharge. CSW to follow.    Expected Discharge Plan: [place] Barriers to Discharge: Continued Medical Work up, Ship broker  Expected Discharge Plan and Services Expected Discharge Plan: [place] Choice: [place] arrangements for the past 2 months: Single Family Home                                       Social Determinants of Health (SDOH) Interventions    Readmission Risk Interventions     No data to display

## 2022-02-21 NOTE — Plan of Care (Signed)

## 2022-02-21 NOTE — Care Management Important Message (Signed)
Important Message  Patient Details  Name: Tiffany [name] MRN: [id number] Date of Birth: 08-27-1950   Medicare Important Message Given:  Yes     [name] [address] 02/21/2022, 4:13 PM

## 2022-02-21 NOTE — Progress Notes (Signed)
FMTS Brief Note Reviewed patient's vitals, recent notes.  Vitals:   02/21/22 1455 02/21/22 2004  BP: 130/83 117/74  Pulse: 77 66  Resp: 16 16  Temp: 98 F (36.7 C) 98.7 F (37.1 C)  SpO2: 100% 100%   At this time, no change in plan from day progress note.  Sharion Settler, DO Page [phone] with questions about this patient.

## 2022-02-21 NOTE — Progress Notes (Signed)
Physical Therapy Treatment Patient Details Name: Tiffany [name] MRN: [id number] DOB: 22-Jul-1951 Today's Date: 02/21/2022   History of Present Illness 71 Y/O F with PMHx of HTN, DJD, GERD, S/P Left TKA, Obesity, and dementia was sent in from her neurologist's office for hypotension. Pt then became briefly unresponsive in triage of ED.    PT Comments    Patient in bed, reports her left arm hurts ( at IV site). RN aware. She is found to be wet in bed due to pure wick not being hooked up properly. Patient rolled R and L for bathing and performed supine >< sit with min/mod A. She has limited participation during session. Answers yes/no questions but that is mostly it. She will continue to benefit from skilled PT to improve strength and functional independence.       Recommendations for follow up therapy are one component of a multi-disciplinary discharge planning process, led by the attending physician.  Recommendations may be updated based on patient status, additional functional criteria and insurance authorization.  Follow Up Recommendations  Skilled nursing-short term rehab (<3 hours/day) Can patient physically be transported by private vehicle: No   Assistance Recommended at Discharge Frequent or constant Supervision/Assistance  Patient can return home with the following Two people to help with walking and/or transfers;Two people to help with bathing/dressing/bathroom;Direct supervision/assist for medications management;Direct supervision/assist for financial management;Assist for transportation;Help with stairs or ramp for entrance   Equipment Recommendations       Recommendations for Other Services       Precautions / Restrictions Precautions Precautions: Fall Restrictions Weight Bearing Restrictions: No     Mobility  Bed Mobility Overal bed mobility: Needs Assistance Bed Mobility: Rolling Rolling: Min assist   Supine to sit: Mod assist, HOB elevated Sit to supine: Min  assist        Transfers                   General transfer comment: declined to attempt to stand.    Ambulation/Gait               General Gait Details: unable today   Stairs             Wheelchair Mobility    Modified Rankin (Stroke Patients Only)       Balance Overall balance assessment: Needs assistance Sitting-balance support: Feet supported Sitting balance-Leahy Scale: Fair   Postural control: Right lateral lean                                  Cognition Arousal/Alertness: Awake/alert Behavior During Therapy: Flat affect Overall Cognitive Status: History of cognitive impairments - at baseline                                 General Comments: patient with minimal responses, verbalizations throughout session        Exercises      General Comments        Pertinent Vitals/Pain Pain Assessment Pain Assessment: Faces Faces Pain Scale: Hurts little more Pain Location: L arm, IV site Pain Descriptors / Indicators: Discomfort, Sore Pain Intervention(s): Monitored during session    Home Living                          Prior Function  PT Goals (current goals can now be found in the care plan section) Acute Rehab PT Goals Patient Stated Goal: didn't state PT Goal Formulation: Patient unable to participate in goal setting Time For Goal Achievement: 03/05/22 Progress towards PT goals: Not progressing toward goals - comment (patient unwilling/unable to attempt standing)    Frequency    Min 2X/week      PT Plan Current plan remains appropriate    Co-evaluation              AM-PAC PT "6 Clicks" Mobility   Outcome Measure  Help needed turning from your back to your side while in a flat bed without using bedrails?: A Little Help needed moving from lying on your back to sitting on the side of a flat bed without using bedrails?: A Lot Help needed moving to and from a bed  to a chair (including a wheelchair)?: A Lot Help needed standing up from a chair using your arms (e.g., wheelchair or bedside chair)?: A Lot Help needed to walk in hospital room?: Total Help needed climbing 3-5 steps with a railing? : Total 6 Click Score: 11    End of Session   Activity Tolerance: Patient tolerated treatment well Patient left: in bed;with call bell/phone within reach;with bed alarm set Nurse Communication: Mobility status PT Visit Diagnosis: Other abnormalities of gait and mobility (R26.89);Difficulty in walking, not elsewhere classified (R26.2)     Time: 4196-2229 PT Time Calculation (min) (ACUTE ONLY): 30 min  Charges:  $Therapeutic Activity: 23-37 mins                     Ogden Handlin, PT, GCS 02/21/22,12:11 PM

## 2022-02-21 NOTE — Progress Notes (Signed)
     Daily Progress Note Intern Pager: [phone]  Patient name: Tiffany [name] Medical record number: [id number] Date of birth: 1951/05/18 Age: 71 y.o. Gender: female  Primary Care Provider: Trey Sailors, PA Consultants: None Code Status: Full  Pt Overview and Major Events to Date:  6/27: admitted 6/29: AKI and electrolyte abnormalities resolved  Assessment and Plan: Tiffany [name] is a 71 y.o. female who presented with hypotension and AKI which have now resolved. Pertinent PMH/PSH includes T2DM, CVA, siezure, dementia, PE.  Stable for discharge. * Hypotension Resolved and has continued to be normotensive. -Hold home amlodipine 5 mg daily, Hyzaar 100-25 mg daily -Nutrition following, appreciate assistance -PT/OT eval and treat  Dementia ([place]) Requiring assistance with placement for long-term care.  Social worker that has been assisting at home can be reached at [phone]. -TOC consult, appreciate assistance -Delirium precautions  T2DM: Diet controlled PE November 2022: did not complete treatment and restarted apixaban after most recent hospitalization 11/2021; continue apixaban CVA: Continue Eliquis 5 mg twice daily, Crestor 10 mg daily Seizure: Continue Keppra 500 mg twice daily  FEN/GI: Carb modified diet PPx: Eliquis 5 mg twice daily Dispo:SNF. Barriers include placement.   Subjective:  States she is doing well and that she is in [facility] in [facility]23.  She is requesting soda.  Objective: Temp:  [98 F (36.7 C)-99.4 F (37.4 C)] 98.3 F (36.8 C) (06/30 1151) Pulse Rate:  [60-71] 65 (06/30 1151) Resp:  [15-18] 18 (06/30 1151) BP: (111-132)/(59-86) 118/75 (06/30 1151) SpO2:  [97 %-100 %] 99 % (06/30 1151) Physical Exam: General: awake, alert, NAD, pleasantly demented Cardiovascular: RRR, no murmurs auscultated Respiratory: CTAB, normal WOB Abdomen: soft, nontender, nondistended, normoactive BS  Laboratory: Most recent CBC Lab Results   Component Value Date   WBC 9.1 02/19/2022   HGB 10.4 (L) 02/19/2022   HCT 32.7 (L) 02/19/2022   MCV 85.2 02/19/2022   PLT 189 02/19/2022   Most recent BMP    Latest Ref Rng & Units 02/21/2022    2:19 AM  BMP  Glucose 70 - 99 mg/dL 82   BUN 8 - 23 mg/dL 19   Creatinine 0.44 - 1.00 mg/dL 1.54   Sodium 135 - 145 mmol/L 144   Potassium 3.5 - 5.1 mmol/L 3.4   Chloride 98 - 111 mmol/L 113   CO2 22 - 32 mmol/L 23   Calcium 8.9 - 10.3 mg/dL 8.6    Imaging/Diagnostic Tests: No recent imaging results. [name], DO 02/21/2022, 1:47 PM  PGY-1, [place] Intern pager: [phone], text pages welcome Secure chat group [place]

## 2022-02-22 DIAGNOSIS — E861 Hypovolemia: Secondary | ICD-10-CM | POA: Diagnosis not present

## 2022-02-22 DIAGNOSIS — I9589 Other hypotension: Secondary | ICD-10-CM | POA: Diagnosis not present

## 2022-02-22 DIAGNOSIS — F03C0 Unspecified dementia, severe, without behavioral disturbance, psychotic disturbance, mood disturbance, and anxiety: Secondary | ICD-10-CM | POA: Diagnosis not present

## 2022-02-22 LAB — BASIC METABOLIC PANEL
Anion gap: 6 (ref 5–15)
BUN: 23 mg/dL (ref 8–23)
CO2: 23 mmol/L (ref 22–32)
Calcium: 8.6 mg/dL — ABNORMAL LOW (ref 8.9–10.3)
Chloride: 113 mmol/L — ABNORMAL HIGH (ref 98–111)
Creatinine, Ser: 1.62 mg/dL — ABNORMAL HIGH (ref 0.44–1.00)
GFR, Estimated: 34 mL/min — ABNORMAL LOW (ref >60)
Glucose, Bld: 91 mg/dL (ref 70–99)
Potassium: 3.8 mmol/L (ref 3.5–5.1)
Sodium: 142 mmol/L (ref 135–145)

## 2022-02-22 NOTE — Progress Notes (Signed)
     Daily Progress Note Intern Pager: [phone]  Patient name: Tiffany [name] Medical record number: [id number] Date of birth: [birth year] Age: 71 y.o. Gender: female  Primary Care Provider: Trey Sailors, PA Consultants: None Code Status: Full  Pt Overview and Major Events to Date:  6/27: Admitted 6/29: AKI and electrolyte abnormalities resolved  Assessment and Plan: Tiffany [name] is a 71 year old female who presented with hypotension and AKI that has since resolved.  She is medically stable and awaiting discharge.  Pertinent PMH/PSH includes T2DM, CVA, seizure, dementia, PE.   * Hypotension Resolved.  BP remained stable -Hold home amlodipine 5 mg daily, Hyzaar 100-25 mg daily -Nutrition following, appreciate assistance -PT/OT eval and treat  Dementia ([place]) Patient is A&O x3 this morning. She is incapable of being dependent and will require long-term facility care. Social worker assisting from home.  Can be reached at [phone]. -TOC consult, appreciate assistance -Delirium precautions   Other chronic and stable conditions T2DM-diet controlled PE-continue apixaban CVA-continue apixaban 5 mg daily, Crestor 10 mg daily Seizure-continue Keppra 500 mg twice daily    FEN/GI: Carb modified diet PPx: Eliquis 5 mg twice daily Dispo:SNF  awaiting placement .   Subjective:  Ms. Loncar says she is doing well and slept well last night.  Has no complaints other than that she would like to get breakfast.   Objective: Temp:  [98 F (36.7 C)-98.7 F (37.1 C)] 98.1 F (36.7 C) (07/01 0343) Pulse Rate:  [62-77] 62 (07/01 0343) Resp:  [16-18] 18 (07/01 0343) BP: (117-130)/(74-83) 130/83 (07/01 0343) SpO2:  [97 %-100 %] 97 % (07/01 0343) Physical Exam: General: Alert, oriented x3, NAD CV: RRR, no murmurs, normal S1/S2 Pulm: CTAB, good WOB on RA, no crackles or wheezing Ext: No BLE edema   Laboratory: Most recent CBC Lab Results  Component Value Date   WBC  9.1 02/19/2022   HGB 10.4 (L) 02/19/2022   HCT 32.7 (L) 02/19/2022   MCV 85.2 02/19/2022   PLT 189 02/19/2022   Most recent BMP    Latest Ref Rng & Units 02/22/2022    3:31 AM  BMP  Glucose 70 - 99 mg/dL 91   BUN 8 - 23 mg/dL 23   Creatinine 0.44 - 1.00 mg/dL 1.62   Sodium 135 - 145 mmol/L 142   Potassium 3.5 - 5.1 mmol/L 3.8   Chloride 98 - 111 mmol/L 113   CO2 22 - 32 mmol/L 23   Calcium 8.9 - 10.3 mg/dL 8.6     Imaging/Diagnostic Tests: No new images  Alen Bleacher, MD 02/22/2022, 7:55 AM  PGY-2, [place] Intern pager: [phone], text pages welcome Secure chat group [place]

## 2022-02-22 NOTE — Progress Notes (Signed)
FMTS Brief Note Reviewed patient's vitals, recent notes.  Vitals:   02/22/22 1100 02/22/22 1500  BP: 120/70 130/81  Pulse: 97 92  Resp: 16 16  Temp: 97.9 F (36.6 C) 97.8 F (36.6 C)  SpO2: 100% 100%   At this time, no change in plan from day progress note.  Alcus Dad, MD Page [phone] with questions about this patient.

## 2022-02-23 DIAGNOSIS — E861 Hypovolemia: Secondary | ICD-10-CM | POA: Diagnosis not present

## 2022-02-23 DIAGNOSIS — I9589 Other hypotension: Secondary | ICD-10-CM | POA: Diagnosis not present

## 2022-02-23 NOTE — Progress Notes (Signed)
Voicemail left for pt's son, Marta Antu [phone], requesting return call re SNF choice and updated insurance information. Will provide updates as available.   Wandra Feinstein, MSW, LCSW [phone] (coverage)

## 2022-02-23 NOTE — Assessment & Plan Note (Signed)
-  Ensure Enlive BID between meals

## 2022-02-23 NOTE — Progress Notes (Signed)
     Daily Progress Note Intern Pager: [phone]  Patient name: Tiffany [name] Medical record number: [id number] Date of birth: 03-21-1951 Age: 71 y.o. Gender: female  Primary Care Provider: Trey Sailors, PA Consultants: None Code Status: Full  Pt Overview and Major Events to Date:  6/27: admitted 6/29: stable for discharge  Assessment and Plan:  Tiffany [name] is a 71 y.o. female who presented with hypotension and AKI which have now resolved. Pertinent PMH/PSH includes dementia, T2DM, CVA, seizure, h/o PE.   * Hypotension Resolved. BP slightly elevated this morning but has been normotensive over past several days. -Home anti-hypertensives discontinued (amlodipine, hyzaar) -Monitor BP  Dementia (HCC) A&O x3 this morning. -TOC following for SNF placement -Delirium precautions  Poor nutrition -Ensure Enlive BID between meals   FEN/GI: Regular diet PPx: Eliquis Dispo:SNF  when bed available/insurance auth complete .   Subjective:  Patient refused her Eliquis and Keppra last night despite multiple attempts from nursing staff. Discussed the importance of her meds in preventing seizure/blood clots and she was agreeable to taking them today. Otherwise has no complaints and states she likes it in the hospital very much.  Objective: Temp:  [97.5 F (36.4 C)-98.6 F (37 C)] 97.7 F (36.5 C) (07/02 0355) Pulse Rate:  [65-97] 66 (07/02 0355) Resp:  [16] 16 (07/01 2139) BP: (109-158)/(70-95) 158/95 (07/02 0355) SpO2:  [98 %-100 %] 100 % (07/02 0355) Physical Exam: General: alert, resting comfortably, NAD Cardiovascular: RRR, normal S1/S2 without m/r/g Respiratory: normal effort, lungs CTAB Abdomen: soft, nontender, nondistended Extremities: no peripheral edema Neuro: oriented to person, place, and year  Laboratory: Most recent CBC Lab Results  Component Value Date   WBC 9.1 02/19/2022   HGB 10.4 (L) 02/19/2022   HCT 32.7 (L) 02/19/2022   MCV 85.2  02/19/2022   PLT 189 02/19/2022   Most recent BMP    Latest Ref Rng & Units 02/22/2022    3:31 AM  BMP  Glucose 70 - 99 mg/dL 91   BUN 8 - 23 mg/dL 23   Creatinine 0.44 - 1.00 mg/dL 1.62   Sodium 135 - 145 mmol/L 142   Potassium 3.5 - 5.1 mmol/L 3.8   Chloride 98 - 111 mmol/L 113   CO2 22 - 32 mmol/L 23   Calcium 8.9 - 10.3 mg/dL 8.6      Imaging/Diagnostic Tests: No new imaging over past 24h.  Alcus Dad, MD 02/23/2022, 6:08 AM  PGY-3, [place] Intern pager: [phone], text pages welcome Secure chat group [place]

## 2022-02-24 DIAGNOSIS — I9589 Other hypotension: Secondary | ICD-10-CM | POA: Diagnosis not present

## 2022-02-24 DIAGNOSIS — E861 Hypovolemia: Secondary | ICD-10-CM | POA: Diagnosis not present

## 2022-02-24 LAB — BASIC METABOLIC PANEL
Anion gap: 8 (ref 5–15)
BUN: 23 mg/dL (ref 8–23)
CO2: 23 mmol/L (ref 22–32)
Calcium: 8.3 mg/dL — ABNORMAL LOW (ref 8.9–10.3)
Chloride: 111 mmol/L (ref 98–111)
Creatinine, Ser: 1.35 mg/dL — ABNORMAL HIGH (ref 0.44–1.00)
GFR, Estimated: 42 mL/min — ABNORMAL LOW (ref >60)
Glucose, Bld: 92 mg/dL (ref 70–99)
Potassium: 3.6 mmol/L (ref 3.5–5.1)
Sodium: 142 mmol/L (ref 135–145)

## 2022-02-24 LAB — CBC
HCT: 30.7 % — ABNORMAL LOW (ref 36.0–46.0)
Hemoglobin: 9.6 g/dL — ABNORMAL LOW (ref 12.0–15.0)
MCH: 27.2 pg (ref 26.0–34.0)
MCHC: 31.3 g/dL (ref 30.0–36.0)
MCV: 87 fL (ref 80.0–100.0)
Platelets: 205 10*3/uL (ref 150–400)
RBC: 3.53 MIL/uL — ABNORMAL LOW (ref 3.87–5.11)
RDW: 13.5 % (ref 11.5–15.5)
WBC: 5.7 10*3/uL (ref 4.0–10.5)
nRBC: 0 % (ref 0.0–0.2)

## 2022-02-24 NOTE — Progress Notes (Signed)
PT Cancellation Note  Patient Details Name: MONICK RENA MRN: [id number] DOB: 1951-03-01   Cancelled Treatment:    Reason Eval/Treat Not Completed: Patient declined, no reason specified  Patient again refusing to participate but would not provide a reason. Became upset when questioned and yelled, "Not today!"   Arby Barrette, [place]  Office [phone]  Rexanne Mano 02/24/2022, 2:28 PM

## 2022-02-24 NOTE — Progress Notes (Signed)
     Daily Progress Note Intern Pager: [phone]  Patient name: Tiffany [name] Medical record number: [id number] Date of birth: 03-20-1951 Age: 71 y.o. Gender: female  Primary Care Provider: Trey Sailors, PA Consultants: None Code Status: Full  Pt Overview and Major Events to Date:  Tiffany [name] is a 71 y.o. female who presented with hypotension and AKI which have now resolved. Pertinent PMH/PSH includes dementia, T2DM, CVA, seizure, h/o PE. Patient is stable awaiting SNF placement.  Assessment and Plan:  * Hypotension Resolved. BP slightly elevated this morning but has been normotensive over past several days. -Home anti-hypertensives discontinued (amlodipine, hyzaar) -Monitor BP  Dementia (HCC) A&O x3 this morning. -TOC following for SNF placement -Delirium precautions  Poor nutrition -Ensure Enlive BID between meals    FEN/GI: Regular diet PPx: Eliquis Dispo:SNF when bed available/insurance auth complete  Subjective:  NAEO. Reports bowel movements and urinating fine. Denies chest pain, difficulty breathing, or stomach pain.   Objective: Temp:  [98 F (36.7 C)-98.2 F (36.8 C)] 98 F (36.7 C) (07/03 0347) Pulse Rate:  [61-69] 63 (07/03 0347) Resp:  [16-20] 20 (07/03 0347) BP: (105-119)/(68-73) 119/68 (07/03 0347) SpO2:  [98 %-100 %] 100 % (07/03 0347) Physical Exam: General: Well appearing female sitting in hospital bed eating breakfast Neuro: A&Ox3 Cardiovascular: RRR, no murmurs Respiratory: Talking comfortably on RA, CTAB Abdomen: Soft non-tender to palpation Extremities: No b/l lower extremity edema, distal pulses intact b/l  Laboratory: Most recent CBC Lab Results  Component Value Date   WBC 5.7 02/24/2022   HGB 9.6 (L) 02/24/2022   HCT 30.7 (L) 02/24/2022   MCV 87.0 02/24/2022   PLT 205 02/24/2022   Most recent BMP    Latest Ref Rng & Units 02/24/2022    4:06 AM  BMP  Glucose 70 - 99 mg/dL 92   BUN 8 - 23 mg/dL 23   Creatinine  0.44 - 1.00 mg/dL 1.35   Sodium 135 - 145 mmol/L 142   Potassium 3.5 - 5.1 mmol/L 3.6   Chloride 98 - 111 mmol/L 111   CO2 22 - 32 mmol/L 23   Calcium 8.9 - 10.3 mg/dL 8.3     Imaging/Diagnostic Tests: No new imaging.  Salvadore Oxford, MD 02/24/2022, 7:57 AM  PGY-1, [place] Intern pager: [phone], text pages welcome Secure chat group [place]

## 2022-02-24 NOTE — TOC Progression Note (Signed)
Transition of Care [facility]) - Progression Note    Patient Details  Name: Tiffany [name] MRN: [id number] Date of Birth: 1951-02-01  Transition of Care [facility]) CM/SW [place], [place] Phone Number: 02/24/2022, 1:17 PM  Clinical Narrative:    CSW attempted to follow up with son for SNF choice and for updated insurance information. Son's phone is stating it cannot take calls at this time. CSW sent compliant text message requesting a call. TOC will continue to follow for DC needs.   Expected Discharge Plan: [place] Barriers to Discharge: Continued Medical Work up, Ship broker  Expected Discharge Plan and Services Expected Discharge Plan: [place] Choice: [place] arrangements for the past 2 months: Single Family Home                                       Social Determinants of Health (SDOH) Interventions    Readmission Risk Interventions     No data to display

## 2022-02-24 NOTE — Progress Notes (Signed)
PT Cancellation Note  Patient Details Name: Tiffany [name] MRN: [id number] DOB: 1950-08-31   Cancelled Treatment:    Reason Eval/Treat Not Completed: Patient declined, no reason specified  Patient refusing adamantly. No reason could be elicited. Offered to return later today and pt stating, "not even then." Will reattempt as schedule permits.   Arby Barrette, PT Acute Rehabilitation Services  Office [phone]  Rexanne Mano 02/24/2022, 9:01 AM

## 2022-02-24 NOTE — Progress Notes (Signed)
Patient refused all am meds including her Keppra and Eliquis.  Dr Thompson Grayer made aware on unit of refusal..  Patient told MD she may take later.

## 2022-02-25 DIAGNOSIS — E861 Hypovolemia: Secondary | ICD-10-CM | POA: Diagnosis not present

## 2022-02-25 DIAGNOSIS — I9589 Other hypotension: Secondary | ICD-10-CM | POA: Diagnosis not present

## 2022-02-25 NOTE — TOC Progression Note (Signed)
Transition of Care [facility]) - Progression Note    Patient Details  Name: Tiffany [name] MRN: [id number] Date of Birth: 09-Jun-1951  Transition of Care [facility]) CM/SW Contact  Joanne Chars, LCSW Phone Number: 02/25/2022, 3:49 PM  Clinical Narrative:   CSW attempted to call son regarding SNF facility choice, son phone number has message that call cannot go through.  No phone number available for daughter.  CSW spoke with pt in room, pt oriented x1, she said she does not have a phone or a number to call her son, she said she has not had any visits from son.  RN not aware of any family that has been by today.  Choice document with bed offers left in pt room.     Expected Discharge Plan: [place] Barriers to Discharge: Continued Medical Work up, Ship broker  Expected Discharge Plan and Services Expected Discharge Plan: [place] Choice: [place] arrangements for the past 2 months: Single Family Home                                       Social Determinants of Health (SDOH) Interventions    Readmission Risk Interventions     No data to display

## 2022-02-25 NOTE — Progress Notes (Signed)
I agree with the above plan 

## 2022-02-25 NOTE — Progress Notes (Signed)
Patient refused to take meds tonight.

## 2022-02-25 NOTE — Progress Notes (Signed)
     Daily Progress Note Intern Pager: [phone]  Patient name: Tiffany [name] Medical record number: [id number] Date of birth: 05/30/1951 Age: 71 y.o. Gender: female  Primary Care Provider: Trey Sailors, PA Consultants: None Code Status: Full  Pt Overview and Major Events to Date:  Tiffany [name] is a 71 y.o. female who presented with hypotension and AKI which have now resolved. Pertinent PMH/PSH includes dementia, T2DM, CVA, seizure, h/o PE. Patient is stable awaiting SNF placement.  Assessment and Plan:  * Hypotension Resolved. BP slightly elevated this morning but has been normotensive over past several days. -Home anti-hypertensives discontinued (amlodipine, hyzaar) -Monitor BP  Dementia (HCC) A&O x3 this morning. -TOC following for SNF placement -Delirium precautions  Poor nutrition -Ensure Enlive BID between meals     FEN/GI: Regular diet PPx: Eliquis Dispo:SNF when bed available/insurance auth complete  Subjective:  NAEO. No chest pain, shortness of breath, or stomach pain. Good appetite. Refusing PT. SW trying to contact son for insurance information.  Objective: Temp:  [98 F (36.7 C)-99.1 F (37.3 C)] 98 F (36.7 C) (07/04 0721) Pulse Rate:  [62-79] 65 (07/04 0721) Resp:  [16-18] 18 (07/04 0721) BP: (106-135)/(67-90) 126/90 (07/04 0721) SpO2:  [98 %-100 %] 98 % (07/04 0721) Physical Exam: General: Well appearing female, lying in hospital bed Neuro: A&Ox3 today Cardiovascular: RRR, no murmurs Respiratory: normal WOB on RA Abdomen: Soft nontender, non distended, no rebound or guarding Extremities: Right radial pulse 2+ intact  Laboratory: Most recent CBC Lab Results  Component Value Date   WBC 5.7 02/24/2022   HGB 9.6 (L) 02/24/2022   HCT 30.7 (L) 02/24/2022   MCV 87.0 02/24/2022   PLT 205 02/24/2022   Most recent BMP    Latest Ref Rng & Units 02/24/2022    4:06 AM  BMP  Glucose 70 - 99 mg/dL 92   BUN 8 - 23 mg/dL 23    Creatinine 0.44 - 1.00 mg/dL 1.35   Sodium 135 - 145 mmol/L 142   Potassium 3.5 - 5.1 mmol/L 3.6   Chloride 98 - 111 mmol/L 111   CO2 22 - 32 mmol/L 23   Calcium 8.9 - 10.3 mg/dL 8.3       Imaging/Diagnostic Tests:  Salvadore Oxford, MD 02/25/2022, 7:32 AM  PGY-1, [place] Intern pager: [phone], text pages welcome Secure chat group [facility] Teaching Service

## 2022-02-26 DIAGNOSIS — I9589 Other hypotension: Secondary | ICD-10-CM | POA: Diagnosis not present

## 2022-02-26 DIAGNOSIS — E861 Hypovolemia: Secondary | ICD-10-CM | POA: Diagnosis not present

## 2022-02-26 LAB — CBC
HCT: 32.6 % — ABNORMAL LOW (ref 36.0–46.0)
Hemoglobin: 10.4 g/dL — ABNORMAL LOW (ref 12.0–15.0)
MCH: 27.9 pg (ref 26.0–34.0)
MCHC: 31.9 g/dL (ref 30.0–36.0)
MCV: 87.4 fL (ref 80.0–100.0)
Platelets: 202 10*3/uL (ref 150–400)
RBC: 3.73 MIL/uL — ABNORMAL LOW (ref 3.87–5.11)
RDW: 13.6 % (ref 11.5–15.5)
WBC: 6.7 10*3/uL (ref 4.0–10.5)
nRBC: 0 % (ref 0.0–0.2)

## 2022-02-26 NOTE — Progress Notes (Signed)
Nutrition Follow-up  DOCUMENTATION CODES:  Not applicable  INTERVENTION:  Continue current diet as ordered, will change to automatic trays in order.  Discontinue Ensure Enlive, pt refusing Magic cup TID with meals, each supplement provides 290 kcal and 9 grams of protein Request new measured weight  NUTRITION DIAGNOSIS:  Inadequate oral intake related to decreased appetite as evidenced by per patient/family report. - remains applicable  GOAL:  Patient will meet greater than or equal to 90% of their needs - progressing, diet and supplements in place  MONITOR:  PO intake, Supplement acceptance, I & O's, Labs  REASON FOR ASSESSMENT:  Consult Assessment of nutrition requirement/status (dehydration, concern for malnutrition)  ASSESSMENT:  Pt with hx of HTN, GERD, DM type 2, hx CVA, and dementia presented to ED with hypotension and AKI from her Neurology appointment. At baseline, pt lives with son.  Pt remains inpatient while CM works on discharge plans. No meals recorded, spoke with RN about intake. Pt is particular about what she will agree to depending on her mood and her provider. States she doesn't like ensure but does like ice cream. Will switch supplements.   Nutritionally Relevant Medications: Scheduled Meds:  Ensure Enlive  237 mL Oral BID BM   rosuvastatin  10 mg Oral Daily   Labs Reviewed  NUTRITION - FOCUSED PHYSICAL EXAM: Flowsheet Row Most Recent Value  Orbital Region Mild depletion  Upper Arm Region No depletion  Thoracic and Lumbar Region No depletion  Buccal Region No depletion  Temple Region Mild depletion  Clavicle Bone Region No depletion  Clavicle and Acromion Bone Region No depletion  Scapular Bone Region No depletion  Dorsal Hand No depletion  Patellar Region Mild depletion  Anterior Thigh Region Mild depletion  Posterior Calf Region Mild depletion  Edema (RD Assessment) Mild  [bilateral legs]  Hair Reviewed  Eyes Reviewed  Mouth Reviewed  Skin  Reviewed  Nails Reviewed   Diet Order:   Diet Order             Diet regular Room service appropriate? Yes; Fluid consistency: Thin  Diet effective now                   EDUCATION NEEDS:  Not appropriate for education at this time  Skin:  Skin Assessment: Reviewed RN Assessment  Last BM:  7/4  Height:  Ht Readings from Last 1 Encounters:  02/18/22 '5\' 2"'$  (1.575 m)    Weight:  Wt Readings from Last 1 Encounters:  02/18/22 81.1 kg    Ideal Body Weight:  50 kg  BMI:  Body mass index is 32.7 kg/m.  Estimated Nutritional Needs:  Kcal:  1500-1700 kcal/d Protein:  75-90g/d Fluid:  >/=1.5L/d    Ranell Patrick, RD, LDN Clinical Dietitian RD pager # available in [facility]  After hours/weekend pager # available in [facility]

## 2022-02-26 NOTE — TOC Progression Note (Addendum)
Transition of Care [facility]) - Progression Note    Patient Details  Name: VARINA HULON MRN: [id number] Date of Birth: 1951/03/02  Transition of Care [facility]) CM/SW Contact  Coralee Pesa, [place] Phone Number: 02/26/2022, 12:59 PM  Clinical Narrative:    CSW received call from pt's son who confirmed they would like a bed at [facility]. He noted he would send over new insurance information. CSW confirmed with Kathleen Argue they will likely have a bed tomorrow, and will start authorization. MD notified and advised pt is medically ready for DC. TOC will continue to follow for DC needs.  Pt now has [facility] Medicare, Member # [id number]. Will need new PT note to start authorization.   Expected Discharge Plan: [place] Barriers to Discharge: Continued Medical Work up, Ship broker  Expected Discharge Plan and Services Expected Discharge Plan: [place] Choice: [place] arrangements for the past 2 months: Single Family Home                                       Social Determinants of Health (SDOH) Interventions    Readmission Risk Interventions     No data to display

## 2022-02-26 NOTE — Progress Notes (Signed)
Occupational Therapy Treatment Patient Details Name: Tiffany [name] MRN: [id number] DOB: Feb 18, 1951 Today's Date: 02/26/2022   History of present illness 71 Y/O F with PMHx of HTN, DJD, GERD, S/P Left TKA, Obesity, and dementia was sent in from her neurologist's office for hypotension. Pt then became briefly unresponsive in triage of ED.   OT comments  Pt progressing towards goals, completes seated UB/LB bathing task with mod A up in chair, pt needing increased cues/encouragement to perform/attempt tasks on her own. Pt min-mod A +2 for bed mobility and step pivot transfer to chair using RW. Pt presenting with impairments listed below, will follow acutely. Continue to recommend SNF at d/c.   Recommendations for follow up therapy are one component of a multi-disciplinary discharge planning process, led by the attending physician.  Recommendations may be updated based on patient status, additional functional criteria and insurance authorization.    Follow Up Recommendations  Skilled nursing-short term rehab (<3 hours/day)    Assistance Recommended at Discharge Frequent or constant Supervision/Assistance  Patient can return home with the following  Two people to help with walking and/or transfers;Two people to help with bathing/dressing/bathroom;Assistance with cooking/housework;Direct supervision/assist for medications management;Direct supervision/assist for financial management;Assist for transportation;Help with stairs or ramp for entrance   Equipment Recommendations  None recommended by OT;Other (comment) (defer to next venue of care)    Recommendations for Other Services      Precautions / Restrictions Precautions Precautions: Fall Precaution Comments: dementia Restrictions Weight Bearing Restrictions: No       Mobility Bed Mobility Overal bed mobility: Needs Assistance Bed Mobility: Sidelying to Sit   Sidelying to sit: Mod assist, +2 for physical assistance       General  bed mobility comments: + bedrail use    Transfers Overall transfer level: Needs assistance Equipment used: Rolling walker (2 wheels), 2 person hand held assist Transfers: Bed to chair/wheelchair/BSC, Sit to/from Stand Sit to Stand: Mod assist, Min assist, +2 physical assistance     Step pivot transfers: Min assist, Mod assist, +2 physical assistance     General transfer comment: increasing assistance behavioral vs requiring physical assist     Balance Overall balance assessment: Needs assistance Sitting-balance support: Feet supported Sitting balance-Leahy Scale: Fair   Postural control: Right lateral lean Standing balance support: Bilateral upper extremity supported, During functional activity, Reliant on assistive device for balance Standing balance-Leahy Scale: Poor Standing balance comment: dependent on physical assist and cuing                           ADL either performed or assessed with clinical judgement   ADL Overall ADL's : Needs assistance/impaired     Grooming: Set up   Upper Body Bathing: Moderate assistance Upper Body Bathing Details (indicate cue type and reason): sitting up in chair Lower Body Bathing: Moderate assistance Lower Body Bathing Details (indicate cue type and reason): sitting up in chair Upper Body Dressing : Minimal assistance Upper Body Dressing Details (indicate cue type and reason): to don gown     Toilet Transfer: Minimal assistance;BSC/3in1;Stand-pivot;+2 for physical assistance;Moderate assistance Toilet Transfer Details (indicate cue type and reason): simulated to chair                Extremity/Trunk Assessment Upper Extremity Assessment Upper Extremity Assessment: Generalized weakness (impaired fine motor skills due to long fingernails)   Lower Extremity Assessment Lower Extremity Assessment: Defer to PT evaluation  Vision   Additional Comments: will further assess   Perception  Perception Perception: Not tested   Praxis Praxis Praxis: Not tested    Cognition Arousal/Alertness: Awake/alert Behavior During Therapy: Flat affect Overall Cognitive Status: History of cognitive impairments - at baseline                                 General Comments: decreased verbalization during session        Exercises      Shoulder Instructions       General Comments VSS on RA, NT present during session, pt more responsive to cues from NT    Pertinent Vitals/ Pain       Pain Assessment Pain Assessment: Faces Pain Score: 4  Faces Pain Scale: Hurts little more Pain Location: back Pain Descriptors / Indicators: Discomfort, Sore Pain Intervention(s): Limited activity within patient's tolerance, Monitored during session, Repositioned  Home Living                                          Prior Functioning/Environment              Frequency  Min 2X/week        Progress Toward Goals  OT Goals(current goals can now be found in the care plan section)  Progress towards OT goals: Progressing toward goals  Acute Rehab OT Goals Patient Stated Goal: none stated OT Goal Formulation: With patient Time For Goal Achievement: 03/05/22 Potential to Achieve Goals: Fair ADL Goals Pt Will Perform Upper Body Dressing: with supervision;sitting Pt Will Perform Lower Body Dressing: sit to/from stand;sitting/lateral leans;with min assist Pt Will Transfer to Toilet: with min assist;squat pivot transfer;stand pivot transfer;bedside commode Pt Will Perform Tub/Shower Transfer: Shower transfer;Tub transfer;tub bench;ambulating;rolling walker;with min assist Additional ADL Goal #1: Pt will complete bed mobility min A in prep for ADLs  Plan Discharge plan remains appropriate;Frequency remains appropriate    Co-evaluation                 AM-PAC OT "6 Clicks" Daily Activity     Outcome Measure                    End of  Session Equipment Utilized During Treatment: Gait belt;Rolling walker (2 wheels)  OT Visit Diagnosis: Unsteadiness on feet (R26.81);Other abnormalities of gait and mobility (R26.89);Muscle weakness (generalized) (M62.81);Other symptoms and signs involving cognitive function   Activity Tolerance Patient tolerated treatment well   Patient Left in chair;with call bell/phone within reach;with chair alarm set;with nursing/sitter in room   Nurse Communication Mobility status        Time: 1020-1039 OT Time Calculation (min): 19 min  Charges: OT General Charges $OT Visit: 1 Visit OT Treatments $Self Care/Home Management : 8-22 mins  Lynnda Child, OTD, OTR/L Acute Rehab [phone]) [phone] - [place] 02/26/2022, 12:23 PM

## 2022-02-26 NOTE — Progress Notes (Signed)
PT Cancellation Note  Patient Details Name: Tiffany [name] MRN: [id number] DOB: 02/14/1951   Cancelled Treatment:    Reason Eval/Treat Not Completed: Other (comment).  Reports she does not want to do therapy, no reason offered.  Follow up at another time.   Ramond Dial 02/26/2022, 2:40 PM  Mee Hives, PT PhD Acute Rehab Dept. Number: [place] and [place]

## 2022-02-26 NOTE — Progress Notes (Signed)
     Daily Progress Note Intern Pager: [phone]  Patient name: Tiffany [name] Medical record number: [id number] Date of birth: 10/26/1950 Age: 71 y.o. Gender: female  Primary Care Provider: Trey Sailors, PA Consultants: None Code Status: Full  Pt Overview and Major Events to Date:  Tiffany [name] is a 71 y.o. female who presented with hypotension and AKI which have now resolved. Pertinent PMH/PSH includes dementia, T2DM, CVA, seizure, h/o PE. Patient is stable awaiting SNF placement.  Assessment and Plan:  * Hypotension Resolved. BP slightly elevated this morning but has been normotensive over past several days. -Home anti-hypertensives discontinued (amlodipine, hyzaar) -Monitor BP  Dementia (HCC) A&O x3 this morning. -TOC following for SNF placement -Delirium precautions  Poor nutrition -Ensure Enlive BID between meals       FEN/GI: Regular diet PPx: Eliquis Dispo:SNF when bed available/insurance auth complete  Subjective:  NAEO. No chest pain or shortness of breath.  Objective: Temp:  [98.1 F (36.7 C)-98.4 F (36.9 C)] 98.3 F (36.8 C) (07/05 0739) Pulse Rate:  [58-100] 58 (07/05 0739) Resp:  [13-18] 17 (07/05 0739) BP: (123-150)/(75-87) 133/87 (07/05 0739) SpO2:  [99 %-100 %] 99 % (07/05 0739) Physical Exam: General: Well appearing female, lying in bed. Cardiovascular: RRR Respiratory: Normal work of breathing on RA Abdomen: Soft, non tender to palpation, no rebound or guarding Extremities: Distal pulses intact  Laboratory: Most recent CBC Lab Results  Component Value Date   WBC 6.7 02/26/2022   HGB 10.4 (L) 02/26/2022   HCT 32.6 (L) 02/26/2022   MCV 87.4 02/26/2022   PLT 202 02/26/2022   Most recent BMP    Latest Ref Rng & Units 02/24/2022    4:06 AM  BMP  Glucose 70 - 99 mg/dL 92   BUN 8 - 23 mg/dL 23   Creatinine 0.44 - 1.00 mg/dL 1.35   Sodium 135 - 145 mmol/L 142   Potassium 3.5 - 5.1 mmol/L 3.6   Chloride 98 - 111 mmol/L  111   CO2 22 - 32 mmol/L 23   Calcium 8.9 - 10.3 mg/dL 8.3     Other pertinent labs jnone  Imaging/Diagnostic Tests: No new imaging. Salvadore Oxford, MD 02/26/2022, 7:47 AM  PGY-1, [place] Intern pager: [phone], text pages welcome Secure chat group [place]

## 2022-02-27 DIAGNOSIS — E861 Hypovolemia: Secondary | ICD-10-CM | POA: Diagnosis not present

## 2022-02-27 DIAGNOSIS — I9589 Other hypotension: Secondary | ICD-10-CM | POA: Diagnosis not present

## 2022-02-27 NOTE — TOC Progression Note (Addendum)
Transition of Care [facility]) - Progression Note    Patient Details  Name: Tiffany [name] MRN: [id number] Date of Birth: 1950/12/04  Transition of Care [facility]) CM/SW Contact  Coralee Pesa, [place] Phone Number: 02/27/2022, 11:30 AM  Clinical Narrative:    3:30 Authorization is pending at this time. Pt's family has chosen bed at [facility]. Authorization on hold until PT can work with pt. Medical team and facility updated that this is a barrier. TOC will continue to follow for DC needs.  Expected Discharge Plan: [place] Barriers to Discharge: Continued Medical Work up, Ship broker  Expected Discharge Plan and Services Expected Discharge Plan: [place] Choice: [place] arrangements for the past 2 months: Single Family Home                                       Social Determinants of Health (SDOH) Interventions    Readmission Risk Interventions     No data to display

## 2022-02-27 NOTE — Progress Notes (Signed)
     Daily Progress Note Intern Pager: [phone]  Patient name: Tiffany [name] Medical record number: [id number] Date of birth: 08/03/1951 Age: 71 y.o. Gender: female  Primary Care Provider: Trey Sailors, PA Consultants: None Code Status: Full  Pt Overview and Major Events to Date:  Tiffany [name] is a 71 y.o. female who presented with hypotension and AKI which have now resolved. Pertinent PMH/PSH includes dementia, T2DM, CVA, seizure, h/o PE. Patient is stable awaiting SNF placement.  Assessment and Plan:  * Hypotension Resolved. BP slightly elevated this morning but has been normotensive over past several days. -Home anti-hypertensives discontinued (amlodipine, hyzaar) -Monitor BP  Dementia (HCC) A&O x3 this morning. -TOC following for SNF placement -Delirium precautions  Poor nutrition -Ensure Enlive BID between meals       FEN/GI: Regular diet PPx: Eliquis Dispo: SNF when insurance auth complete, pending PT/OT  Subjective:  NAEO. Reports having bowel movements and no pain with urinating. No chest pain or shortness of breath.  Objective: Temp:  [98 F (36.7 C)-98.9 F (37.2 C)] 98.9 F (37.2 C) (07/06 0416) Pulse Rate:  [58-73] 65 (07/06 0416) Resp:  [16-20] 18 (07/06 0416) BP: (113-142)/(72-92) 114/73 (07/06 0416) SpO2:  [95 %-100 %] 96 % (07/06 0416) Physical Exam: General: Well appearing female, lying on hospital bed, NAD Neuro: A&Ox3 Cardiovascular: RRR, right radial pulse 2+ Respiratory: normal work of breathing on RA Abdomen: soft, non tender diffusely, no rebound or guarding Extremities: No lower extremity edema bilaterally  Laboratory: Most recent CBC Lab Results  Component Value Date   WBC 6.7 02/26/2022   HGB 10.4 (L) 02/26/2022   HCT 32.6 (L) 02/26/2022   MCV 87.4 02/26/2022   PLT 202 02/26/2022   Most recent BMP    Latest Ref Rng & Units 02/24/2022    4:06 AM  BMP  Glucose 70 - 99 mg/dL 92   BUN 8 - 23 mg/dL 23    Creatinine 0.44 - 1.00 mg/dL 1.35   Sodium 135 - 145 mmol/L 142   Potassium 3.5 - 5.1 mmol/L 3.6   Chloride 98 - 111 mmol/L 111   CO2 22 - 32 mmol/L 23   Calcium 8.9 - 10.3 mg/dL 8.3     Other pertinent labs none   Imaging/Diagnostic Tests: No new imaging. Salvadore Oxford, MD 02/27/2022, 7:39 AM  PGY-1, [place] Intern pager: [phone], text pages welcome Secure chat group [place]

## 2022-02-27 NOTE — Progress Notes (Signed)
Physical Therapy Treatment Patient Details Name: ANGELEA PENNY MRN: [id number] DOB: 12-22-1950 Today's Date: 02/27/2022   History of Present Illness 71 yo F with PMHx of HTN, DJD, GERD, S/P Left TKA, Obesity, and dementia was sent in from her neurologist's office 6/27 for hypotension. Pt then became briefly unresponsive in triage of ED.    PT Comments    Pt tolerates treatment well after encouragement from PT and MD. Pt continues to demonstrate LE weakness, requiring assistance to power up into standing and to reduce risk for falls. PT provides education on the importance for continued functional mobility and LE exercise in an effort to reduce dependence and falls risk. PT continues to recommend SNF placement at this time.   Recommendations for follow up therapy are one component of a multi-disciplinary discharge planning process, led by the attending physician.  Recommendations may be updated based on patient status, additional functional criteria and insurance authorization.  Follow Up Recommendations  Skilled nursing-short term rehab (<3 hours/day) Can patient physically be transported by private vehicle: No   Assistance Recommended at Discharge Frequent or constant Supervision/Assistance  Patient can return home with the following Two people to help with walking and/or transfers;A lot of help with bathing/dressing/bathroom;Assistance with cooking/housework;Direct supervision/assist for medications management;Direct supervision/assist for financial management;Assist for transportation;Help with stairs or ramp for entrance   Equipment Recommendations  None recommended by PT    Recommendations for Other Services       Precautions / Restrictions Precautions Precautions: Fall Precaution Comments: dementia Restrictions Weight Bearing Restrictions: No     Mobility  Bed Mobility Overal bed mobility: Needs Assistance Bed Mobility: Supine to Sit     Supine to sit: Mod assist, HOB  elevated     General bed mobility comments: increased time, hand hold to scoot to edge of bed    Transfers Overall transfer level: Needs assistance Equipment used: 2 person hand held assist Transfers: Sit to/from Stand, Bed to chair/wheelchair/BSC Sit to Stand: Mod assist, +2 physical assistance   Step pivot transfers: Mod assist       General transfer comment: pt requries assistance to power up into standing and UE support for stability    Ambulation/Gait                   Stairs             Wheelchair Mobility    Modified Rankin (Stroke Patients Only)       Balance Overall balance assessment: Needs assistance Sitting-balance support: No upper extremity supported, Feet supported Sitting balance-Leahy Scale: Fair     Standing balance support: Bilateral upper extremity supported, Reliant on assistive device for balance Standing balance-Leahy Scale: Poor                              Cognition Arousal/Alertness: Awake/alert Behavior During Therapy: WFL for tasks assessed/performed Overall Cognitive Status: History of cognitive impairments - at baseline                                 General Comments: pt requiring encouragement to participate in therapy, initially wants to stop after bed mobility        Exercises General Exercises - Lower Extremity Ankle Circles/Pumps: AROM, Both, 10 reps Gluteal Sets: AROM, Both, 5 reps Long Arc Quad: AROM, Both, 10 reps Hip Flexion/Marching: AROM, Both, 5 reps  General Comments General comments (skin integrity, edema, etc.): VSS on RA, pt reporting nausea      Pertinent Vitals/Pain Pain Assessment Pain Assessment: Faces Faces Pain Scale: Hurts little more Pain Location: stomach Pain Descriptors / Indicators: Aching Pain Intervention(s): Monitored during session    Home Living                          Prior Function            PT Goals (current goals can  now be found in the care plan section) Acute Rehab PT Goals Patient Stated Goal: didn't state Progress towards PT goals: Progressing toward goals (slowly)    Frequency    Min 2X/week      PT Plan Current plan remains appropriate    Co-evaluation              AM-PAC PT "6 Clicks" Mobility   Outcome Measure  Help needed turning from your back to your side while in a flat bed without using bedrails?: A Little Help needed moving from lying on your back to sitting on the side of a flat bed without using bedrails?: A Lot Help needed moving to and from a bed to a chair (including a wheelchair)?: A Lot Help needed standing up from a chair using your arms (e.g., wheelchair or bedside chair)?: A Lot Help needed to walk in hospital room?: Total Help needed climbing 3-5 steps with a railing? : Total 6 Click Score: 11    End of Session   Activity Tolerance: Patient tolerated treatment well Patient left: in chair;with call bell/phone within reach;with chair alarm set Nurse Communication: Mobility status PT Visit Diagnosis: Other abnormalities of gait and mobility (R26.89);Difficulty in walking, not elsewhere classified (R26.2)     Time: 4650-3546 PT Time Calculation (min) (ACUTE ONLY): 23 min  Charges:  $Therapeutic Exercise: 8-22 mins $Therapeutic Activity: 8-22 mins                     Zenaida Niece, PT, DPT Acute Rehabilitation Office [place] 02/27/2022, 3:09 PM

## 2022-02-28 DIAGNOSIS — I9589 Other hypotension: Secondary | ICD-10-CM | POA: Diagnosis not present

## 2022-02-28 DIAGNOSIS — E861 Hypovolemia: Secondary | ICD-10-CM | POA: Diagnosis not present

## 2022-02-28 MED ORDER — AMLODIPINE BESYLATE 5 MG PO TABS
5.0000 mg | ORAL_TABLET | Freq: Every day | ORAL | Status: DC
  Administered 2022-02-28: 5 mg via ORAL
  Filled 2022-02-28: qty 1

## 2022-02-28 NOTE — TOC Transition Note (Signed)
Transition of Care [facility]) - CM/SW Discharge Note   Patient Details  Name: Tiffany [name] MRN: [id number] Date of Birth: 01/23/1951  Transition of Care [facility]) CM/SW Contact:  Coralee Pesa, [place] Phone Number: 02/28/2022, 12:33 PM   Clinical Narrative:    Pt to be transported to [facility] via [place]. Nurse to call report to [phone]   Final next level of care: Skilled Nursing Facility Barriers to Discharge: Barriers Resolved   Patient Goals and CMS Choice Patient states their goals for this hospitalization and ongoing recovery are:: patient unable to participate in goal setting, not oriented CMS Medicare.gov Compare Post Acute Care list provided to:: Patient Represenative (must comment) Choice offered to / list presented to : Adult Children  Discharge Placement              Patient chooses bed at: [facility] Patient to be transferred to facility by: [place] Name of family member notified: Marta Antu Patient and family notified of of transfer: 02/28/22  Discharge Plan and Services     Post Acute Care Choice: [place]                               Social Determinants of Health (SDOH) Interventions     Readmission Risk Interventions     No data to display

## 2022-02-28 NOTE — Discharge Summary (Signed)
[facility] Discharge Summary  Patient name: Tiffany [name] Medical record number: [id number] Date of birth: [birth year] Age: 71 y.o. Gender: female Date of Admission: 02/18/2022  Date of Discharge: 02/28/2022 Admitting Physician: [name], DO  Primary Care Provider: Trey Sailors, PA Consultants: none  Indication for Hospitalization: hypotension  Brief Hospital Course:  Tiffany [name] is a 71 y.o.female with a history of dementia, CKD 3B, CVA/seizure, HTN, GERD, OA, T2DM, dementia, PE who was admitted to the [facility] at [facility] for hypotension and AKI related to hypovolemia. Her hospital course is detailed below:  AKI Presented with creatinine 2.4 with baseline 1.3.  Received fluid resuscitation throughout and improved back to baseline of 1.3.  Signs of malnutrition and electrolytes of potassium magnesium were replenished during hospitalization as needed.  Hypotension Presented after being advised to go to ED by neurologist for BP 70/50. Hypotension likely in setting of poor PO intake.  Corrected to normotensive ranges with fluid resuscitation.  Blood pressure medications discontinued during this encounter. Restarted home amlodipine on day of discharge. Recommend SNF to follow BP and adjust as necessary.   Dementia Given dementia progression, social work assisted with SNF placement with hope to progress towards more permanent living assistance. Recommend goals of care conversation with family given patient's poor prognosis and little continued benefit from statins, etc.   Other chronic conditions were medically managed with home medications and formulary alternatives as necessary (T2DM, PE, CVA, seizure)  PCP Follow-up Recommendations: Recommend daily monitor of blood pressure for follow-up and re-addition of Hyzaar. Recommend goals of care conversation.   Disposition: SNF  Discharge Condition: stable  Discharge Exam:   Blood pressure (!) 151/89, pulse 67, temperature 98.3 F (36.8 C), resp. rate 18, height '5\' 2"'$  (1.575 m), weight 85 kg, SpO2 100 %.  General: Well appearing female sitting up in hospital bed, eating breakfast no acute distress Cardiovascular: RRR, no murmurs Respiratory: Normal work of breathing on RA Abdomen: Soft non-tender diffusely, non distended, no rebound or guarding Extremities: No right lower extremity edema, right radial pulse 2+  Significant Procedures: none  Significant Labs and Imaging:      Latest Ref Rng & Units 02/24/2022    4:06 AM 02/22/2022    3:31 AM 02/21/2022    2:19 AM  BMP  Glucose 70 - 99 mg/dL 92  91  82   BUN 8 - 23 mg/dL '23  23  19   '$ Creatinine 0.44 - 1.00 mg/dL 1.35  1.62  1.54   Sodium 135 - 145 mmol/L 142  142  144   Potassium 3.5 - 5.1 mmol/L 3.6  3.8  3.4   Chloride 98 - 111 mmol/L 111  113  113   CO2 22 - 32 mmol/L '23  23  23   '$ Calcium 8.9 - 10.3 mg/dL 8.3  8.6  8.6       Latest Ref Rng & Units 02/26/2022    1:00 AM 02/24/2022    4:06 AM 02/19/2022    2:37 AM  CBC  WBC 4.0 - 10.5 K/uL 6.7  5.7  9.1   Hemoglobin 12.0 - 15.0 g/dL 10.4  9.6  10.4   Hematocrit 36.0 - 46.0 % 32.6  30.7  32.7   Platelets 150 - 400 K/uL 202  205  189    CXR 02/18/2022 IMPRESSION: 1.  Aortic Atherosclerosis (ICD10-I70.0). 2. Mild enlargement of the cardiopericardial silhouette, without edema.  Results/Tests Pending at Time of Discharge:  none  Discharge Medications:  Allergies as of 02/28/2022       Reactions   Cymbalta [duloxetine Hcl] Other (See Comments)   Sweating, bad dreams,        Medication List     STOP taking these medications    losartan-hydrochlorothiazide 100-25 MG tablet Commonly known as: HYZAAR       TAKE these medications    acetaminophen 325 MG tablet Commonly known as: TYLENOL Take 2 tablets (650 mg total) by mouth every 6 (six) hours as needed for mild pain, moderate pain, fever or headache (or temp > 37.5 C (99.5 F)).    albuterol 108 (90 Base) MCG/ACT inhaler Commonly known as: VENTOLIN HFA Inhale 2 puffs into the lungs every 6 (six) hours as needed for wheezing or shortness of breath.   albuterol (2.5 MG/3ML) 0.083% nebulizer solution Commonly known as: PROVENTIL Take 3 mLs (2.5 mg total) by nebulization every 4 (four) hours as needed for wheezing or shortness of breath.   amLODipine 5 MG tablet Commonly known as: NORVASC Take 5 mg by mouth daily.   apixaban 5 MG Tabs tablet Commonly known as: ELIQUIS Take 1 tablet (5 mg total) by mouth 2 (two) times daily.   feeding supplement Liqd Take 237 mLs by mouth 2 (two) times daily between meals.   levETIRAcetam 500 MG tablet Commonly known as: KEPPRA Take 1 tablet (500 mg total) by mouth 2 (two) times daily.   multivitamin with minerals Tabs tablet Take 1 tablet by mouth daily.   rosuvastatin 10 MG tablet Commonly known as: CRESTOR Take 1 tablet (10 mg total) by mouth daily.       Discharge Instructions: Please refer to Patient Instructions section of EMR for full details.  Patient was counseled important signs and symptoms that should prompt return to medical care, changes in medications, dietary instructions, activity restrictions, and follow up appointments.   Follow-Up Appointments:  Contact information for after-discharge care     Destination     [facility] Preferred SNF .   Service: Skilled Nursing Contact information: [address] [place] [phone]                    Salvadore Oxford, MD 02/28/2022, 11:37 AM PGY-1, [facility]  Upper Level Addendum: I have reviewed the above note, making necessary revisions as appropriate. I agree with the medical decision making and physical exam as noted above. Ezequiel Essex, MD PGY-3 [facility]

## 2022-02-28 NOTE — Progress Notes (Signed)
Report called to [employer], given tgo receiving nurse [place], LPN.  AVS placed in discharge packet for [facility].

## 2022-02-28 NOTE — Discharge Instructions (Signed)
Dear Percival Spanish,   Thank you for letting us participate in your care! In this section, you will find a brief hospital admission summary of why you were admitted to the hospital, what happened during your admission, your diagnosis/diagnoses, and recommended follow up.  You were admitted because of low blood pressure. This was most likely caused by dehydration.  Your medical team gave you IV fluids in order to rehydrate you. Your blood pressure return to a safe level.   POST-HOSPITAL & CARE INSTRUCTIONS Monitor your blood pressure, and follow-up with you PCP for restarting your home blood pressure medications. Please let PCP/Specialists know of any changes in medications that were made.  Please see medications section of this packet for any medication changes.   DOCTOR'S APPOINTMENTS & FOLLOW UP Future Appointments  Date Time Provider [place]  03/26/2022  8:50 AM Charlott Rakes, MD [facility] None     Thank you for choosing [facility]! Take care and be well!  [facility]  [place], [place] [address] [phone]

## 2022-02-28 NOTE — Progress Notes (Signed)
     Daily Progress Note Intern Pager: [phone]  Patient name: Tiffany [name] Medical record number: [id number] Date of birth: [birth year] Age: 71 y.o. Gender: female  Primary Care Provider: Trey Sailors, PA Consultants: None Code Status: Full  Pt Overview and Major Events to Date:  Tiffany [name] is a 71 y.o. female who presented with hypotension and AKI which have now resolved. Pertinent PMH/PSH includes dementia, T2DM, CVA, seizure, h/o PE. Patient is stable awaiting SNF placement.  Assessment and Plan:  * Hypotension Resolved. BP now hypertensive this morning -Restart home amlodipine -Monitor BP  Dementia (HCC) A&O x3 this morning. -TOC following for SNF placement -Delirium precautions  Poor nutrition -Ensure Enlive BID between meals     FEN/GI: Regular diet PPx: Eliquis Dispo: Pending insurance authorization to SNF  Subjective:  NAEO. No chest pain, shortness of breath or stomach pain. States she is ready to go to SNF.   Objective: Temp:  [97.8 F (36.6 C)-98.6 F (37 C)] 98.3 F (36.8 C) (07/07 0752) Pulse Rate:  [63-76] 67 (07/07 0752) Resp:  [18-20] 18 (07/07 0752) BP: (110-152)/(61-89) 151/89 (07/07 0752) SpO2:  [99 %-100 %] 100 % (07/07 0531) Weight:  [85 kg] 85 kg (07/07 0531) Physical Exam: General: Well appearing female sitting up in hospital bed, eating breakfast no acute distress Cardiovascular: RRR, no murmurs Respiratory: Normal work of breathing on RA Abdomen: Soft non-tender diffusely, non distended, no rebound or guarding Extremities: No right lower extremity edema, right radial pulse 2+  Laboratory: Most recent CBC Lab Results  Component Value Date   WBC 6.7 02/26/2022   HGB 10.4 (L) 02/26/2022   HCT 32.6 (L) 02/26/2022   MCV 87.4 02/26/2022   PLT 202 02/26/2022   Most recent BMP    Latest Ref Rng & Units 02/24/2022    4:06 AM  BMP  Glucose 70 - 99 mg/dL 92   BUN 8 - 23 mg/dL 23   Creatinine 0.44 - 1.00 mg/dL 1.35    Sodium 135 - 145 mmol/L 142   Potassium 3.5 - 5.1 mmol/L 3.6   Chloride 98 - 111 mmol/L 111   CO2 22 - 32 mmol/L 23   Calcium 8.9 - 10.3 mg/dL 8.3     Other pertinent labs: none   Imaging/Diagnostic Tests:  No new imaging. Salvadore Oxford, MD 02/28/2022, 11:32 AM  PGY-1, [place] Intern pager: [phone], text pages welcome Secure chat group [place]

## 2022-03-26 ENCOUNTER — Ambulatory Visit: Payer: Medicare (Managed Care) | Admitting: Family Medicine
